# Patient Record
Sex: Female | Born: 1945 | Race: White | Hispanic: No | Smoking: Current every day smoker
Health system: Southern US, Community
[De-identification: ages and names within clinical notes are randomized; demographics above are authoritative.]

## PROBLEM LIST (undated history)

## (undated) DIAGNOSIS — T7840XA Allergy, unspecified, initial encounter: Secondary | ICD-10-CM

## (undated) DIAGNOSIS — M5126 Other intervertebral disc displacement, lumbar region: Secondary | ICD-10-CM

## (undated) DIAGNOSIS — B182 Chronic viral hepatitis C: Secondary | ICD-10-CM

## (undated) DIAGNOSIS — J42 Unspecified chronic bronchitis: Secondary | ICD-10-CM

## (undated) DIAGNOSIS — M47812 Spondylosis without myelopathy or radiculopathy, cervical region: Secondary | ICD-10-CM

## (undated) DIAGNOSIS — S82899A Other fracture of unspecified lower leg, initial encounter for closed fracture: Secondary | ICD-10-CM

## (undated) DIAGNOSIS — K219 Gastro-esophageal reflux disease without esophagitis: Secondary | ICD-10-CM

## (undated) DIAGNOSIS — J439 Emphysema, unspecified: Secondary | ICD-10-CM

## (undated) DIAGNOSIS — C50919 Malignant neoplasm of unspecified site of unspecified female breast: Secondary | ICD-10-CM

## (undated) DIAGNOSIS — C801 Malignant (primary) neoplasm, unspecified: Secondary | ICD-10-CM

## (undated) DIAGNOSIS — K8020 Calculus of gallbladder without cholecystitis without obstruction: Secondary | ICD-10-CM

## (undated) DIAGNOSIS — Z87442 Personal history of urinary calculi: Secondary | ICD-10-CM

## (undated) DIAGNOSIS — R0600 Dyspnea, unspecified: Secondary | ICD-10-CM

## (undated) DIAGNOSIS — J45909 Unspecified asthma, uncomplicated: Secondary | ICD-10-CM

## (undated) DIAGNOSIS — J449 Chronic obstructive pulmonary disease, unspecified: Secondary | ICD-10-CM

## (undated) DIAGNOSIS — K635 Polyp of colon: Secondary | ICD-10-CM

## (undated) DIAGNOSIS — I10 Essential (primary) hypertension: Secondary | ICD-10-CM

## (undated) DIAGNOSIS — E785 Hyperlipidemia, unspecified: Secondary | ICD-10-CM

## (undated) DIAGNOSIS — J189 Pneumonia, unspecified organism: Secondary | ICD-10-CM

## (undated) DIAGNOSIS — M51369 Other intervertebral disc degeneration, lumbar region without mention of lumbar back pain or lower extremity pain: Secondary | ICD-10-CM

## (undated) DIAGNOSIS — M5136 Other intervertebral disc degeneration, lumbar region: Secondary | ICD-10-CM

## (undated) DIAGNOSIS — M1990 Unspecified osteoarthritis, unspecified site: Secondary | ICD-10-CM

## (undated) DIAGNOSIS — K5792 Diverticulitis of intestine, part unspecified, without perforation or abscess without bleeding: Secondary | ICD-10-CM

## (undated) HISTORY — DX: Unspecified osteoarthritis, unspecified site: M19.90

## (undated) HISTORY — DX: Essential (primary) hypertension: I10

## (undated) HISTORY — DX: Chronic obstructive pulmonary disease, unspecified: J44.9

## (undated) HISTORY — PX: FRACTURE SURGERY: SHX138

## (undated) HISTORY — DX: Polyp of colon: K63.5

## (undated) HISTORY — DX: Malignant neoplasm of unspecified site of unspecified female breast: C50.919

## (undated) HISTORY — DX: Hyperlipidemia, unspecified: E78.5

## (undated) HISTORY — DX: Chronic viral hepatitis C: B18.2

## (undated) HISTORY — DX: Unspecified asthma, uncomplicated: J45.909

## (undated) HISTORY — PX: TUBAL LIGATION: SHX77

## (undated) HISTORY — DX: Calculus of gallbladder without cholecystitis without obstruction: K80.20

## (undated) HISTORY — DX: Spondylosis without myelopathy or radiculopathy, cervical region: M47.812

## (undated) HISTORY — DX: Diverticulitis of intestine, part unspecified, without perforation or abscess without bleeding: K57.92

## (undated) HISTORY — PX: TONSILLECTOMY: SUR1361

## (undated) HISTORY — DX: Emphysema, unspecified: J43.9

## (undated) HISTORY — PX: EYE SURGERY: SHX253

## (undated) HISTORY — PX: TONSILLECTOMY AND ADENOIDECTOMY: SHX28

## (undated) HISTORY — PX: CATARACT EXTRACTION: SUR2

## (undated) HISTORY — DX: Gastro-esophageal reflux disease without esophagitis: K21.9

## (undated) HISTORY — DX: Allergy, unspecified, initial encounter: T78.40XA

---

## 1978-07-02 HISTORY — PX: ABDOMINAL HYSTERECTOMY: SHX81

## 1997-07-02 HISTORY — PX: CHOLECYSTECTOMY: SHX55

## 2003-11-08 ENCOUNTER — Inpatient Hospital Stay (HOSPITAL_COMMUNITY): Admission: EM | Admit: 2003-11-08 | Discharge: 2003-11-10 | Payer: Self-pay | Admitting: Emergency Medicine

## 2009-07-02 HISTORY — PX: BREAST SURGERY: SHX581

## 2010-01-30 ENCOUNTER — Encounter: Admission: RE | Admit: 2010-01-30 | Discharge: 2010-01-30 | Payer: Self-pay | Admitting: Family Medicine

## 2010-02-07 ENCOUNTER — Encounter: Admission: RE | Admit: 2010-02-07 | Discharge: 2010-02-07 | Payer: Self-pay | Admitting: Family Medicine

## 2010-02-07 HISTORY — PX: BREAST BIOPSY: SHX20

## 2010-05-08 ENCOUNTER — Encounter: Admission: RE | Admit: 2010-05-08 | Discharge: 2010-05-08 | Payer: Self-pay | Admitting: Family Medicine

## 2010-07-31 ENCOUNTER — Other Ambulatory Visit: Payer: Self-pay | Admitting: Family Medicine

## 2010-07-31 DIAGNOSIS — Z09 Encounter for follow-up examination after completed treatment for conditions other than malignant neoplasm: Secondary | ICD-10-CM

## 2010-10-03 ENCOUNTER — Ambulatory Visit
Admission: RE | Admit: 2010-10-03 | Discharge: 2010-10-03 | Disposition: A | Discharge status: Home / Self Care | Payer: Medicaid Other | Source: Ambulatory Visit | Attending: Family Medicine | Admitting: Family Medicine

## 2010-10-03 DIAGNOSIS — Z09 Encounter for follow-up examination after completed treatment for conditions other than malignant neoplasm: Secondary | ICD-10-CM

## 2010-11-16 DIAGNOSIS — M179 Osteoarthritis of knee, unspecified: Secondary | ICD-10-CM | POA: Insufficient documentation

## 2010-11-17 NOTE — Discharge Summary (Signed)
NAMEBEVAN, VU                           ACCOUNT NO.:  [id number]   MEDICAL RECORD NO.:  [id number]                   PATIENT TYPE:  INP   LOCATION:  0454                                 FACILITY:  [facility] ([place]   PHYSICIAN:  Almedia Balls. Ranell Patrick, M.D.              DATE OF BIRTH:  04/25/1946   DATE OF ADMISSION:  11/08/2003  DATE OF DISCHARGE:  11/10/2003                                 DISCHARGE SUMMARY   ADDENDUM:   DISCHARGE PLANNING:  The patient was discharged home on Nov 11, 2003.   DIET:  Regular.   ACTIVITY:  Regular as long as she is wearing her double stirrup splint and  sling.   MEDICATIONS:  The patient was discharged on Percocet 1 tab q.4-6 h. p.r.n.  pain, Robaxin 500 mg q.6 h. p.r.n.  The patient was also given a Nicoderm  patch during her hospital stay.   FOLLOW UP:  The patient is to followup in 7-10 days in the office at [phone]  [phone].     Thomas B. Durwin Nora, P.A.                     Almedia Balls. Ranell Patrick, M.D.    TBD/MEDQ  D:  11/11/2003  T:  11/11/2003  Job:  [id number]

## 2010-11-17 NOTE — Op Note (Signed)
NAMEJACORIA, [name]                           ACCOUNT NO.:  [id number]   MEDICAL RECORD NO.:  [id number]                   PATIENT TYPE:  INP   LOCATION:  0454                                 FACILITY:  [facility]   PHYSICIAN:  Almedia Balls. Ranell Patrick, M.D.              DATE OF BIRTH:  1946-02-12   DATE OF PROCEDURE:  11/09/2003  DATE OF DISCHARGE:                                 OPERATIVE REPORT   PREOPERATIVE DIAGNOSES:  Left comminuted proximal ulnar fracture with  displaced radiocapitellar joint/Monteggia lesion.   POSTOPERATIVE DIAGNOSES:  Left comminuted proximal ulnar fracture with  displaced radial capitellar joint/Monteggia lesion.   PROCEDURE:  Open reduction and internal fixation of left proximal ulna  fracture with maintenance of radiocapitellar joint.   SURGEON:  Almedia Balls. Ranell Patrick, M.D.   ASSISTANT:  Alphonsa Overall, P.A.-C.   ANESTHESIA:  General.   ESTIMATED BLOOD LOSS:  Minimal.   TOURNIQUET TIME:  1 hour and 20 minutes.   INSTRUMENT COUNT:  Correct.   COMPLICATIONS:  None.   ANTIBIOTICS:  Perioperative antibiotics were given.   INDICATIONS FOR PROCEDURE:  The patient is a 65 year old female status post  closed reduction of a Monteggia fracture early this morning and measurement  of compartment pressures for impending compartment syndrome. The patient's  compartments became supple today with reduction of the radiocapitellar  joint. The patient now presents for definitive fixation of a comminuted  displaced and unstable ulnar fracture.   DESCRIPTION OF PROCEDURE:  After an adequate level anesthesia was achieved,  the patient was positioned supine on the operating table, nonsterile  tourniquet was placed on the left proximal arm, left arm sterilely prepped  and draped. Using C-arm fluoroscopy, an approach was made to the posterior  subcutaneous aspect of the proximal ulna. The fracture site was easily  identified, fractured hematoma and debris was removed from the  fracture  site, the fracture was anatomically reduced with __________reduction clamps.  K wires were used for provisional fixation and then an [employer]uMed contoured  olecranon plate was placed posteriorly and superiorly on the fractured ulna.  This was then secured to the fracture fragments utilizing a combination of  3.5 cortical and 2.7 cortical screws, these are bicortical when possible.  Their position and length were checked using C-arm in multiple planes. Care  was taken to ensure that no screws entered the joint. The elbow was taken  through a full range of motion and was noted to be stable including  radiocapitellar range of motion with pronation and supination. Thorough  irrigation was performed  followed by closure of the wound using a layered closure with 2-0 Vicryl and  a running 4-0 Monocryl. Steri-Strips were applied followed by a long arm  splint with the elbow at about 90 and the forearm maximally supinated to  stabilize the radiocapitellar joint.  Almedia Balls. Ranell Patrick, M.D.    SRN/MEDQ  D:  11/09/2003  T:  11/09/2003  Job:  [id number]

## 2010-11-17 NOTE — Op Note (Signed)
NAMEMALANEY, MCBEAN                           ACCOUNT NO.:  [id number]   MEDICAL RECORD NO.:  [id number]                   PATIENT TYPE:  INP   LOCATION:  0454                                 FACILITY:  [facility]   PHYSICIAN:  Almedia Balls. Ranell Patrick, M.D.              DATE OF BIRTH:  1945/10/11   DATE OF PROCEDURE:  11/09/2003  DATE OF DISCHARGE:                                 OPERATIVE REPORT   PREOPERATIVE DIAGNOSES:  Left Monteggia fracture with impending compartment  syndrome.   POSTOPERATIVE DIAGNOSES:  Left Monteggia fracture with impending compartment  syndrome.   PROCEDURE:  Compartment pressure checks using arterial line transducer left  forearm followed by closed reduction of left Monteggia fracture and  application of long arm splint.   SURGEON:  Almedia Balls. Ranell Patrick, M.D.   ASSISTANT:  None.   ANESTHESIA:  General anesthesia was used.   ESTIMATED BLOOD LOSS:  None.   COMPLICATIONS:  None.   FLUIDS REPLACED:  500 mL crystalloid.   INDICATIONS FOR PROCEDURE:  The patient is a 65 year old female who presents  with a left Monteggia fracture after falling on an out stretched left hand.  The patient was splinted on Nov 08, 2003, date of injury, and was planned for  surgery on Nov 09, 2003. During the night, the patient developed  paresthesias, numbness in her hand despite unwrapping her splint and  changing our position. The patient maintained numbness and tingling in her  hand, she had tense compartments and due to concern of her revolving  compartment syndrome, the patient was taken emergently to the operating room  for compartment pressure checks.   DESCRIPTION OF PROCEDURE:  After an adequate level of anesthesia was  achieved, the patient's arm was prepped, an 18 gauge needle was used with an  arterial line transducer to transduce the pressures within the compartment  starting in the upper arm above the elbow to measure a normal compartment  and this measured 4 mmHg at  this point.  The line was flushed and the common  dorsal compartment was checked and this was 32 with a diastolic pressure in  the mid 04'V.  At this point, the mobile __________ was checked, this was 12  and then the volar forearm was checked and this was 27 with diastolic  approaching 58. At this point, a procedure was made to proceed with closed  reduction and a remeasurement of compartments. We were able to successful  close reduce her radial head back into congruency with her capitellum. This  resulted in less deformity at the elbow, her ulna aligned better and we were  then able to let this rest for five minutes. The compartments seemed the  relax. We reassessed her dorsal compartment there and the pressure had gone  from about 32 down to 30, diastolic up to about 50 at this point from the  mid 40's. Based on improvement in the  patient's fracture position, the  visual improvement in her compartment being more supple and pliable and the  slight decrease in her compartment pressure as well as acceptable pressures  in the other compartments, it was decided by the attending surgeon to go  ahead and splint her at this time. We did not have the availability of  contoured plates for the olecranon fracture that we needed for open  reduction and internal fixation at this time and it would take a full hour  of transport for the instruments and preparation time, sterilization time  prior to being able to fix her elbow. Not wanting to expose her to an extra  hour of general anesthesia if it was unnecessary, a decision was made in  consultation with the attending anesthesia staff to wake the patient up.  We  will bring her back at her scheduled time at 10:30 or 11:00 this morning for  open reduction and internal fixation under general anesthesia.  The patient  was in stable condition when she finished surgery and was taken to the  recovery room in a long arm splint. Post splinting films were  obtained in  the operating room demonstrating a congruent reduction of her  radiocapitellar joint.                                               Almedia Balls. Ranell Patrick, M.D.    SRN/MEDQ  D:  11/09/2003  T:  11/09/2003  Job:  [id number]

## 2010-11-17 NOTE — Discharge Summary (Signed)
NAMECYRILLA, [name]                           ACCOUNT NO.:  [id number]   MEDICAL RECORD NO.:  [id number]                   PATIENT TYPE:  INP   LOCATION:  0454                                 FACILITY:  [facility]   PHYSICIAN:  Almedia Balls. Ranell Patrick, M.D.              DATE OF BIRTH:  1945-07-09   DATE OF ADMISSION:  11/08/2003  DATE OF DISCHARGE:  11/10/2003                                 DISCHARGE SUMMARY   ADMISSION DIAGNOSIS:  Left Monteggia fracture.   DISCHARGE DIAGNOSIS:  Left Monteggia fracture, status post open  reduction/internal fixation.   BRIEF HISTORY:  [name] is a 65 year old health female who states that  she had fallen from a standing position down onto her left arm on an  outstretched hand, landing straight on her left elbow.  Patient noticed  moderate-to-severe pain with a moderate deformity in her left elbow.  Presented to the [facility] emergency department for evaluation.  It was  determined that she had a left Monteggia fracture that needed surgical  repair.  Patient was admitted to the hospital to have this procedure  performed.   FIRST PROCEDURE:  On Nov 09, 2003 at 0500, patient had a closed reduction of  displaced radial capitellum.   ANESTHESIA:  General.   SURGEON:  Almedia Balls. Ranell Patrick, M.D.   ASSISTANT:  None.   ESTIMATED BLOOD LOSS:  None.   FLUIDS:  Crystalloid 500 cc.   COMPLICATIONS:  None.   SECOND PROCEDURE:  On Nov 09, 2003 at 1100, open reduction/internal fixation  of the left ulna.   ANESTHESIA:  General.   ESTIMATED BLOOD LOSS:  Minimal.   TOURNIQUET TIME:  One hour, 20 minutes.   Perioperative antibiotics were given.   COMPLICATIONS:  None.   INSTRUMENT COUNT:  Correct.   SURGEON:  Almedia Balls. Ranell Patrick, M.D.   ASSISTANT:  Donnie Coffin. Dixon, PA-C   LABORATORY VALUES:  Preop labs for [name] on May 9th show a white blood  count of 10.3, H&H of 14.5 and 41.9, platelets 341.  Chemistries:  Sodium  139, potassium 4.2, chloride  110, bicarb 25, BUN 12, creatinine 0.8, glucose  92.   HOSPITAL COURSE:  Patient was admitted on Nov 08, 2003 from the emergency  room after sustaining a fall and a left Monteggia fracture.  The patient was  splinted and sent to 4 [facility] for pain control until surgery the next day.  During the evening and mid morning, the patient developed numbness and  decreased sensation in her left arm, signifying possible compartment  syndrome.  Dr. Ranell Patrick was called in.  After it was determined that she did  have compartment syndrome in the left forearm, the patient was taken to the  operating room for surgery  #1, where the radial capitellum was reduced  under closed reduction.  Compartment pressures immediately dropped, and  patient was splinted for definitive surgery later that day.  At 1100 on May  10th, the patient was taken back to the operating room and had a left open  reduction/internal fixation of her left comminuted ulnar fracture with  displaced radial capitellum.  See above procedure note.  Patient tolerated  the procedure well.  She was transferred to the post anesthesia care unit  and then back up to the floor without difficulty.   On Nov 11, 2003, patient stated that she was doing quite well and was asking  for discharge home.  She denied very much pain.  She denied any numbness,  tingling, swelling, or loss of sensation in her left hand.  Capillary refill  was less than two seconds, and her grip strength was adequate at a 4/5, as  compared to her right.  Splint was intact.  The patient was afebrile.   DISCHARGE PLAN:  Patient was discharged home on Nov 11, 2003.   DIET:  Regular.   ACTIVITY:  DICTATION ENDED AT THIS POINT     Thomas B. Durwin Nora, P.A.                     Almedia Balls. Ranell Patrick, M.D.    TBD/MEDQ  D:  11/11/2003  T:  11/11/2003  Job:  [id number]

## 2011-04-18 ENCOUNTER — Other Ambulatory Visit: Payer: Self-pay | Admitting: Family Medicine

## 2011-04-18 DIAGNOSIS — N63 Unspecified lump in unspecified breast: Secondary | ICD-10-CM

## 2011-05-08 ENCOUNTER — Ambulatory Visit
Admission: RE | Admit: 2011-05-08 | Discharge: 2011-05-08 | Disposition: A | Discharge status: Home / Self Care | Payer: Medicare Other | Source: Ambulatory Visit | Attending: Family Medicine | Admitting: Family Medicine

## 2011-05-08 DIAGNOSIS — N63 Unspecified lump in unspecified breast: Secondary | ICD-10-CM

## 2011-05-29 DIAGNOSIS — R0789 Other chest pain: Secondary | ICD-10-CM | POA: Insufficient documentation

## 2011-05-29 HISTORY — DX: Other chest pain: R07.89

## 2011-10-02 DIAGNOSIS — J302 Other seasonal allergic rhinitis: Secondary | ICD-10-CM | POA: Insufficient documentation

## 2011-12-25 ENCOUNTER — Ambulatory Visit (INDEPENDENT_AMBULATORY_CARE_PROVIDER_SITE_OTHER): Payer: Medicaid Other | Admitting: Family Medicine

## 2011-12-25 ENCOUNTER — Encounter: Payer: Self-pay | Admitting: Family Medicine

## 2011-12-25 VITALS — BP 110/75 | HR 87 | Temp 98.2°F | Ht 60.5 in | Wt 143.6 lb

## 2011-12-25 DIAGNOSIS — F341 Dysthymic disorder: Secondary | ICD-10-CM

## 2011-12-25 DIAGNOSIS — I10 Essential (primary) hypertension: Secondary | ICD-10-CM

## 2011-12-25 DIAGNOSIS — F418 Other specified anxiety disorders: Secondary | ICD-10-CM

## 2011-12-25 DIAGNOSIS — K5900 Constipation, unspecified: Secondary | ICD-10-CM

## 2011-12-25 DIAGNOSIS — J449 Chronic obstructive pulmonary disease, unspecified: Secondary | ICD-10-CM

## 2011-12-25 DIAGNOSIS — K219 Gastro-esophageal reflux disease without esophagitis: Secondary | ICD-10-CM | POA: Insufficient documentation

## 2011-12-25 DIAGNOSIS — K5909 Other constipation: Secondary | ICD-10-CM | POA: Insufficient documentation

## 2011-12-25 MED ORDER — VENLAFAXINE HCL ER 37.5 MG PO CP24: 37.5000 mg | ORAL_CAPSULE | Freq: Every day | ORAL | Status: DC

## 2011-12-25 NOTE — Assessment & Plan Note (Signed)
New to provider.  Chronic for pt.  Excellent control today.  Asymptomatic.  No changes.

## 2011-12-25 NOTE — Assessment & Plan Note (Signed)
New to provider.  Pt reports this was situational and would like to wean off SSRI.  Plan outlined in pt instructions.
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
## 2011-12-25 NOTE — Patient Instructions (Addendum)
Schedule your complete physical for November Decrease to 1 tab of 37.5mg  Effexor daily x2-4 weeks.  When you are ready, decrease to every other day x2-4 weeks and then stop Call with any questions or concerns Think of [place] as your home base Welcome!  We're glad to have you!!!

## 2011-12-25 NOTE — Assessment & Plan Note (Signed)
New to provider.  Chronic for pt.  Feels sxs are well controlled on current med regimen.  Only SOB occurs during exercise (which pt does regularly).  Quit smoking in April- applauded this.  Will continue to follow.

## 2011-12-25 NOTE — Assessment & Plan Note (Signed)
New to provider, chronic for pt.  sxs now well controlled after being switched to Dexilant.  Will continue to follow.

## 2012-04-15 ENCOUNTER — Other Ambulatory Visit: Payer: Self-pay | Admitting: Family Medicine

## 2012-04-15 DIAGNOSIS — Z1231 Encounter for screening mammogram for malignant neoplasm of breast: Secondary | ICD-10-CM

## 2012-04-15 DIAGNOSIS — Z780 Asymptomatic menopausal state: Secondary | ICD-10-CM

## 2012-05-06 ENCOUNTER — Encounter: Payer: Medicare Other | Admitting: Family Medicine

## 2012-05-19 ENCOUNTER — Ambulatory Visit
Admission: RE | Admit: 2012-05-19 | Discharge: 2012-05-19 | Disposition: A | Discharge status: Home / Self Care | Payer: Medicare Other | Source: Ambulatory Visit | Attending: Family Medicine | Admitting: Family Medicine

## 2012-05-19 DIAGNOSIS — Z1231 Encounter for screening mammogram for malignant neoplasm of breast: Secondary | ICD-10-CM

## 2012-05-19 DIAGNOSIS — Z780 Asymptomatic menopausal state: Secondary | ICD-10-CM

## 2012-07-21 ENCOUNTER — Encounter: Payer: Medicare Other | Admitting: Family Medicine

## 2012-08-22 DIAGNOSIS — Z79890 Hormone replacement therapy (postmenopausal): Secondary | ICD-10-CM | POA: Insufficient documentation

## 2012-08-22 HISTORY — DX: Hormone replacement therapy: Z79.890

## 2012-11-20 ENCOUNTER — Other Ambulatory Visit: Payer: Self-pay | Admitting: Family Medicine

## 2012-11-20 DIAGNOSIS — M545 Low back pain: Secondary | ICD-10-CM

## 2012-11-28 ENCOUNTER — Ambulatory Visit
Admission: RE | Admit: 2012-11-28 | Discharge: 2012-11-28 | Disposition: A | Discharge status: Home / Self Care | Payer: Medicare Other | Source: Ambulatory Visit | Attending: Family Medicine | Admitting: Family Medicine

## 2012-11-28 DIAGNOSIS — M545 Low back pain: Secondary | ICD-10-CM

## 2012-12-01 ENCOUNTER — Ambulatory Visit (INDEPENDENT_AMBULATORY_CARE_PROVIDER_SITE_OTHER): Payer: Self-pay

## 2012-12-01 DIAGNOSIS — K5900 Constipation, unspecified: Secondary | ICD-10-CM

## 2013-04-10 ENCOUNTER — Other Ambulatory Visit: Payer: Self-pay

## 2013-04-10 DIAGNOSIS — Z1231 Encounter for screening mammogram for malignant neoplasm of breast: Secondary | ICD-10-CM

## 2013-05-21 ENCOUNTER — Ambulatory Visit: Payer: Self-pay

## 2013-06-01 ENCOUNTER — Other Ambulatory Visit: Payer: Self-pay | Admitting: Dermatology

## 2013-06-15 DIAGNOSIS — C4492 Squamous cell carcinoma of skin, unspecified: Secondary | ICD-10-CM | POA: Insufficient documentation

## 2013-06-15 HISTORY — DX: Squamous cell carcinoma of skin, unspecified: C44.92

## 2013-06-18 ENCOUNTER — Ambulatory Visit
Admission: RE | Admit: 2013-06-18 | Discharge: 2013-06-18 | Disposition: A | Discharge status: Home / Self Care | Payer: Medicare Other | Source: Ambulatory Visit

## 2013-06-18 DIAGNOSIS — Z1231 Encounter for screening mammogram for malignant neoplasm of breast: Secondary | ICD-10-CM

## 2013-06-22 ENCOUNTER — Encounter (INDEPENDENT_AMBULATORY_CARE_PROVIDER_SITE_OTHER): Payer: Self-pay

## 2013-06-22 ENCOUNTER — Ambulatory Visit (INDEPENDENT_AMBULATORY_CARE_PROVIDER_SITE_OTHER)
Admission: RE | Admit: 2013-06-22 | Discharge: 2013-06-22 | Disposition: A | Discharge status: Home / Self Care | Payer: Medicare Other | Source: Ambulatory Visit | Attending: Pulmonary Disease | Admitting: Pulmonary Disease

## 2013-06-22 ENCOUNTER — Encounter: Payer: Self-pay | Admitting: Pulmonary Disease

## 2013-06-22 ENCOUNTER — Ambulatory Visit (INDEPENDENT_AMBULATORY_CARE_PROVIDER_SITE_OTHER): Payer: Medicare Other | Admitting: Pulmonary Disease

## 2013-06-22 VITALS — BP 110/72 | HR 79 | Temp 98.1°F | Ht 61.0 in | Wt 157.0 lb

## 2013-06-22 DIAGNOSIS — J449 Chronic obstructive pulmonary disease, unspecified: Secondary | ICD-10-CM

## 2013-06-22 MED ORDER — HYDROCHLOROTHIAZIDE 25 MG PO TABS: 25.0000 mg | ORAL_TABLET | Freq: Every day | ORAL | Status: DC

## 2013-06-22 MED ORDER — NICOTINE 10 MG IN INHA: 1.0000 | RESPIRATORY_TRACT | Status: DC | PRN

## 2013-06-22 NOTE — Progress Notes (Signed)
Subjective:    Patient ID: [name], female    DOB: 1946/04/22, 67 y.o.   MRN: [id number]  HPI  PCP -Asencion Partridge, [facility]   67 year old one pack per day smoker referred for evaluation of dyspnea. PFTs on 05/20/13 showed FEV1 of 86%-1.76, FVC of 88%-2.39 in ratio 74. Diffusion capacity was decreased at 12.7-65% but adjusted for alveolar volume. He also underwent cardiac cath which showed normal coronaries. She reports NYHA class III dyspnea since October 2014, during activities of daily living such as taking a shower, carrying garbage making her bed or cooking. She is maintained on Spiriva and pro-air and is not sure whether these provide any relief. She reports that it is harder to breathe out and has an occasional nocturnal wheezing. She reports a dry deep cough that intermittently bothers her. Medication review shows lisinopril that was substituted for Cozaar in October 2014. She denies seasonal allergies her constant throat clearing no. There is no prior or family history of venous thromboembolism. Labs show her HCO3 of 28 , hemoglobin of 15.6  She started smoking at age 67, smoked a maximum of 2 packs per day and has now cut down to one pack per day. She's tried nicotine patches, gum and hypnotism in area quit attempts. Chantix made her 'go bonkers.'  Chest x-ray did not show any infiltrates or effusions, hyperinflation was noted. She does not desaturate on walking  Past Medical History  Diagnosis Date  . Diverticulitis   . Emphysema of lung   . Allergy   . Colon polyp   . Kidney stones   . Hypertension     Past Surgical History  Procedure Laterality Date  . Cholecystectomy  1999  . Breast surgery  2011    biopsy  . Tonsillectomy and adenoidectomy  1950's  . Abdominal hysterectomy  1980    Allergies  Allergen Reactions  . Chantix [Varenicline]     Makes her feel crazy  . Codeine     Itch     History   Social History  . Marital Status: Legally Separated   Spouse Name: N/A    Number of Children: 0  . Years of Education: N/A   Occupational History  . retired    Social History Main Topics  . Smoking status: Current Every Day Smoker -- 1.00 packs/day for 53 years    Types: Cigarettes  . Smokeless tobacco: Not on file  . Alcohol Use: Yes     Comment: occiasionally  . Drug Use: No  . Sexual Activity: Not on file   Other Topics Concern  . Not on file   Social History Narrative  . No narrative on file    Family History  Problem Relation Age of Onset  . Heart disease Mother   . Stroke Brother   . Stroke Maternal Uncle   . COPD Paternal Uncle      Review of Systems  Constitutional: Negative for fever and unexpected weight change.  HENT: Positive for congestion, postnasal drip, rhinorrhea, sinus pressure and sneezing. Negative for dental problem, ear pain, nosebleeds, sore throat and trouble swallowing.   Eyes: Negative for redness and itching.  Respiratory: Positive for cough, chest tightness and shortness of breath. Negative for wheezing.   Cardiovascular: Negative for palpitations and leg swelling.  Gastrointestinal: Negative for nausea and vomiting.  Genitourinary: Negative for dysuria.  Musculoskeletal: Negative for joint swelling.  Skin: Negative for rash.  Neurological: Negative for headaches.  Hematological: Does not bruise/bleed easily.  Psychiatric/Behavioral: Negative for dysphoric mood. The patient is not nervous/anxious.        Objective:   Physical Exam  Gen. Pleasant, well-nourished, in no distress, normal affect ENT - no lesions, no post nasal drip Neck: No JVD, no thyromegaly, no carotid bruits Lungs: no use of accessory muscles, no dullness to percussion, decreased BL without rales or rhonchi  Cardiovascular: Rhythm regular, heart sounds  normal, no murmurs or gallops, no peripheral edema Abdomen: soft and non-tender, no hepatosplenomegaly, BS normal. Musculoskeletal: No deformities, no cyanosis or  clubbing Neuro:  alert, non focal        Assessment & Plan:

## 2013-06-22 NOTE — Patient Instructions (Signed)
Ambulatory satn & CXR today You have to quit smoking ! You can combine nicotine gum/ patch with nicotrol inhaler (Rx will be sent ) & E- cigarettes Stay on spiriva, Ok to use albuterol before activity STOP lisinopril-HCTZ, Rx for HCTZ 25 mg daily will be sent - Check Bp on this twice a week Flu shot

## 2013-06-22 NOTE — Assessment & Plan Note (Addendum)
Her dyspnea does seem to be out of proportion to degree of airway obstruction. Cardiac etiology has been ruled out, very low risk for venous thromboembolic You have to quit smoking ! You can combine nicotine gum/ patch with nicotrol inhaler (Rx will be sent ) & E- cigarettes Stay on spiriva, Ok to use albuterol before activity STOP lisinopril-HCTZ, Rx for HCTZ 25 mg daily will be sent - Check Bp on this twice a week Flu shot

## 2013-06-24 ENCOUNTER — Telehealth: Payer: Self-pay | Admitting: Pulmonary Disease

## 2013-06-24 NOTE — Telephone Encounter (Signed)
I spoke with patient about results and she verbalized understanding and had no questions 

## 2013-07-20 ENCOUNTER — Encounter: Payer: Self-pay | Admitting: Adult Health

## 2013-07-20 ENCOUNTER — Encounter (INDEPENDENT_AMBULATORY_CARE_PROVIDER_SITE_OTHER): Payer: Self-pay

## 2013-07-20 ENCOUNTER — Ambulatory Visit (INDEPENDENT_AMBULATORY_CARE_PROVIDER_SITE_OTHER): Payer: Medicare Other | Admitting: Adult Health

## 2013-07-20 VITALS — BP 126/76 | HR 69 | Temp 97.8°F | Ht 61.0 in | Wt 156.6 lb

## 2013-07-20 DIAGNOSIS — J449 Chronic obstructive pulmonary disease, unspecified: Secondary | ICD-10-CM

## 2013-07-20 MED ORDER — NICOTINE 10 MG IN INHA: 1.0000 | RESPIRATORY_TRACT | Status: DC | PRN

## 2013-07-20 MED ORDER — HYDROCHLOROTHIAZIDE 25 MG PO TABS: 25.0000 mg | ORAL_TABLET | Freq: Every day | ORAL | Status: DC

## 2013-07-20 NOTE — Assessment & Plan Note (Signed)
Mild COPD by PFT w/ ongoing smoking .  Associated rhinitis , cough improved off ACE inhibitor  Would avoid ACE inhibitors in future   Plan  Saline nasal rinses /spray As needed   Saline Nasal Gel -AYR apply nasal passages As needed   Continue to work on stopping smoking  Continue on Spiriva .  Follow up Dr. Elsworth Soho  In 3 months and As needed   Please contact office for sooner follow up if symptoms do not improve or worsen or seek emergency care

## 2013-07-20 NOTE — Addendum Note (Signed)
Addended by: Parke Poisson E on: 07/20/2013 01:02 PM   Modules accepted: Orders

## 2013-07-20 NOTE — Patient Instructions (Signed)
Saline nasal rinses /spray As needed   Saline Nasal Gel -AYR apply nasal passages As needed   Continue to work on stopping smoking  Continue on Spiriva .  Follow up Dr. Elsworth Soho  In 3 months and As needed   Please contact office for sooner follow up if symptoms do not improve or worsen or seek emergency care

## 2013-07-20 NOTE — Progress Notes (Signed)
Subjective:    Patient ID: [name], female    DOB: [birth year], 68 y.o.   MRN: [id number]  HPI PCP -[name], [facility]  68 year old one pack per day smoker referred for evaluation of dyspnea 06/22/13  PFTs on 05/20/13 showed FEV1 of 86%-1.76, FVC of 88%-2.39 in ratio 74. Diffusion capacity was decreased at 12.7-65% but adjusted for alveolar volume. He also underwent cardiac cath which showed normal coronaries.  06/22/13 IOV She reports NYHA class III dyspnea since October 2014, during activities of daily living such as taking a shower, carrying garbage making her bed or cooking. She is maintained on Spiriva and pro-air and is not sure whether these provide any relief. She reports that it is harder to breathe out and has an occasional nocturnal wheezing. She reports a dry deep cough that intermittently bothers her. Medication review shows lisinopril that was substituted for Cozaar in October 2014. She denies seasonal allergies her constant throat clearing no. There is no prior or family history of venous thromboembolism. Labs show her HCO3 of 28 , hemoglobin of 15.6  She started smoking at age 87, smoked a maximum of 2 packs per day and has now cut down to one pack per day. She's tried nicotine patches, gum and hypnotism in area quit attempts. Chantix made her 'go bonkers.'  Chest x-ray did not show any infiltrates or effusions, hyperinflation was noted. She does not desaturate on walking >>Stay on spiriva, STOP lisinopril-HCTZ, Rx for HCTZ 25 mg daily will be sent  06/22/13-CXR NAD   07/20/2013 Follow up  Pt returns for 1 month follow up.  Says she does feel a little better.  Pt c/o sinus congestion, postnasal drip, some SOB with exertion. Last ov was taken off ACE .  B/p is good on HCTZ alone.  Cough is some better but has daily post nasal drip and nasal stuffiness.  Using nicotrol inhaler , has cut back on cigs -1/2 PPD  She is Retired. But works Investment banker, corporate houses.   Last visit Chest x-ray showed no acute changes. She denies any hemoptysis, orthopnea, PND, leg swelling, calf pain, or chest pain.   Review of Systems Constitutional:   No  weight loss, night sweats,  Fevers, chills, fatigue, or  lassitude.  HEENT:   No headaches,  Difficulty swallowing,  Tooth/dental problems, or  Sore throat,                No sneezing, itching, ear ache, nasal congestion, post nasal drip,   CV:  No chest pain,  Orthopnea, PND, swelling in lower extremities, anasarca, dizziness, palpitations, syncope.   GI  No heartburn, indigestion, abdominal pain, nausea, vomiting, diarrhea, change in bowel habits, loss of appetite, bloody stools.   Resp: No shortness of breath with exertion or at rest.  No excess mucus, no productive cough,  No non-productive cough,  No coughing up of blood.  No change in color of mucus.  No wheezing.  No chest wall deformity  Skin: no rash or lesions.  GU: no dysuria, change in color of urine, no urgency or frequency.  No flank pain, no hematuria   MS:  No joint pain or swelling.  No decreased range of motion.  No back pain.  Psych:  No change in mood or affect. No depression or anxiety.  No memory loss.         Objective:   Physical Exam  GEN: A/Ox3; pleasant , NAD, well nourished   HEENT:  [place]/AT,  EACs-clear, TMs-wnl, NOSE-clear, THROAT-clear, no lesions, no postnasal drip or exudate noted.   NECK:  Supple w/ fair ROM; no JVD; normal carotid impulses w/o bruits; no thyromegaly or nodules palpated; no lymphadenopathy.  RESP  Clear  P & A; w/o, wheezes/ rales/ or rhonchi.no accessory muscle use, no dullness to percussion  CARD:  RRR, no m/r/g  , no peripheral edema, pulses intact, no cyanosis or clubbing.  GI:   Soft & nt; nml bowel sounds; no organomegaly or masses detected.  Musco: Warm bil, no deformities or joint swelling noted.   Neuro: alert, no focal deficits noted.    Skin: Warm, no lesions or rashes         Assessment & Plan:

## 2013-11-30 ENCOUNTER — Other Ambulatory Visit: Payer: Self-pay | Admitting: Dermatology

## 2014-04-27 ENCOUNTER — Other Ambulatory Visit: Payer: Self-pay | Admitting: Family Medicine

## 2014-04-27 DIAGNOSIS — Z1231 Encounter for screening mammogram for malignant neoplasm of breast: Secondary | ICD-10-CM

## 2014-06-02 ENCOUNTER — Other Ambulatory Visit: Payer: Self-pay | Admitting: Dermatology

## 2014-06-08 ENCOUNTER — Other Ambulatory Visit: Payer: Self-pay | Admitting: Dermatology

## 2014-06-22 ENCOUNTER — Ambulatory Visit: Payer: Medicare Other

## 2014-06-28 ENCOUNTER — Ambulatory Visit
Admission: RE | Admit: 2014-06-28 | Discharge: 2014-06-28 | Disposition: A | Discharge status: Home / Self Care | Payer: Medicare Other | Source: Ambulatory Visit | Attending: Family Medicine | Admitting: Family Medicine

## 2014-06-28 DIAGNOSIS — Z1231 Encounter for screening mammogram for malignant neoplasm of breast: Secondary | ICD-10-CM

## 2014-06-30 ENCOUNTER — Other Ambulatory Visit: Payer: Self-pay | Admitting: Family Medicine

## 2014-06-30 DIAGNOSIS — R928 Other abnormal and inconclusive findings on diagnostic imaging of breast: Secondary | ICD-10-CM

## 2014-07-13 ENCOUNTER — Ambulatory Visit
Admission: RE | Admit: 2014-07-13 | Discharge: 2014-07-13 | Disposition: A | Discharge status: Home / Self Care | Payer: Medicare Other | Source: Ambulatory Visit | Attending: Family Medicine | Admitting: Family Medicine

## 2014-07-13 DIAGNOSIS — R928 Other abnormal and inconclusive findings on diagnostic imaging of breast: Secondary | ICD-10-CM

## 2014-11-02 DIAGNOSIS — M47812 Spondylosis without myelopathy or radiculopathy, cervical region: Secondary | ICD-10-CM | POA: Insufficient documentation

## 2014-11-02 DIAGNOSIS — B1920 Unspecified viral hepatitis C without hepatic coma: Secondary | ICD-10-CM | POA: Insufficient documentation

## 2014-11-12 ENCOUNTER — Encounter: Payer: Self-pay | Admitting: Internal Medicine

## 2014-12-17 ENCOUNTER — Other Ambulatory Visit: Payer: Self-pay | Admitting: Family Medicine

## 2014-12-17 DIAGNOSIS — N6489 Other specified disorders of breast: Secondary | ICD-10-CM

## 2014-12-22 ENCOUNTER — Ambulatory Visit
Admission: RE | Admit: 2014-12-22 | Discharge: 2014-12-22 | Disposition: A | Discharge status: Home / Self Care | Payer: Medicare Other | Source: Ambulatory Visit | Attending: Family Medicine | Admitting: Family Medicine

## 2014-12-22 DIAGNOSIS — N6489 Other specified disorders of breast: Secondary | ICD-10-CM

## 2015-01-12 ENCOUNTER — Encounter: Payer: Self-pay | Admitting: Internal Medicine

## 2015-01-12 ENCOUNTER — Ambulatory Visit (INDEPENDENT_AMBULATORY_CARE_PROVIDER_SITE_OTHER): Payer: Medicare Other | Admitting: Internal Medicine

## 2015-01-12 VITALS — BP 102/60 | HR 72 | Ht 60.0 in | Wt 159.1 lb

## 2015-01-12 DIAGNOSIS — K5909 Other constipation: Secondary | ICD-10-CM | POA: Diagnosis not present

## 2015-01-12 DIAGNOSIS — Z720 Tobacco use: Secondary | ICD-10-CM

## 2015-01-12 NOTE — Progress Notes (Signed)
HISTORY OF PRESENT ILLNESS:  [name] is a 69 y.o. female with multiple medical problems as listed below. She is self-referred regarding problems with chronic constipation. She had been involved in GI research studies previously. The patient tells me that she has had lifelong constipation. Associated with this is bloating and gas. She takes Colace regularly without significant improvement. However, she will use Dulcolax intermittently with good results. She tolerates this well. She does state that she was placed on Linzess, but this resulted in unpredictable loose bowels at times described as "a blowout". Patient has been seen by other gastroenterologists previously. Previously, she underwent complete colonoscopy with Dr. Ezzie Dural in [facility] 04/18/2010. The examination revealed left-sided diverticulosis and a benign-appearing diminutive rectal polyp which was removed. Previous CT scan of the abdomen and pelvis with contrast September 2012 to evaluate right lower quadrant pain was negative except for constipation. She is status post cholecystectomy. Patient denies nausea, vomiting, weight loss, bleeding, or fevers. Her stated goal is to have one regular bowel movement each morning without necessarily needing to take agents that promote bowel movements.  REVIEW OF SYSTEMS:  All non-GI ROS negative except for arthritis, cough, allergies  Past Medical History  Diagnosis Date  . Diverticulitis   . Emphysema of lung   . Allergy   . Colon polyp   . Kidney stones   . Hypertension   . COPD (chronic obstructive pulmonary disease)   . Chronic hepatitis C   . GERD (gastroesophageal reflux disease)   . Osteoarthritis   . Spondylosis of cervical region without myelopathy or radiculopathy   . Gallstones     Past Surgical History  Procedure Laterality Date  . Cholecystectomy  1999  . Breast surgery  2011    biopsy  . Tonsillectomy and adenoidectomy  1950's  . Abdominal hysterectomy  [place]  reports that she has been smoking Cigarettes.  She started smoking about 54 years ago. She has a 26.5 pack-year smoking history. She does not have any smokeless tobacco history on file. She reports that she drinks alcohol. She reports that she does not use illicit drugs.  family history includes COPD in her paternal uncle; Heart disease in her mother; Stroke in her brother and maternal uncle.  Allergies  Allergen Reactions  . Chantix [Varenicline]     Makes her feel crazy  . Codeine     Itch        PHYSICAL EXAMINATION: Vital signs: BP 102/60 mmHg  Pulse 72  Ht 5' (1.524 m)  Wt 159 lb 2 oz (72.179 kg)  BMI 31.08 kg/m2  Constitutional: generally well-appearing, no acute distress. Strong odor of tobacco smoke Psychiatric: alert and oriented x3, cooperative Eyes: extraocular movements intact, anicteric, conjunctiva pink Mouth: oral pharynx moist, no lesions Neck: supple no lymphadenopathy Cardiovascular: heart regular rate and rhythm, no murmur Lungs: clear to auscultation bilaterally Abdomen: soft, nontender, nondistended, no obvious ascites, no peritoneal signs, normal bowel sounds, no organomegaly Rectal: Deferred Extremities: no clubbing cyanosis or lower extremity edema bilaterally Skin: no lesions on visible extremities Neuro: No focal deficits. No asterixis.   ASSESSMENT:  #1. Chronic functional constipation. Ongoing. #2. Colonoscopy 2011 with diverticulosis and diminutive rectal polyp #3. Multiple medical problems. Stable #4. Chronic tobacco abuse  PLAN:  #1. Discussed with her multiple strategies to deal with her constipation. Since she can use Dulcolax on demand with good results and no appreciable negative effects, I would recommend this strategy. I discussed  with her the introduction of regular MiraLAX and titrate this to achieve the desired effect. She will give it a try. I did tell her that she would not likely achieve her  goals, as stated above, with regards to bowel movements. #2. Return to the care of your primary provider #3. Stop smoking. Advised

## 2015-01-12 NOTE — Patient Instructions (Signed)
Please follow up as needed 

## 2015-02-01 DIAGNOSIS — K7400 Hepatic fibrosis, unspecified: Secondary | ICD-10-CM | POA: Insufficient documentation

## 2015-05-18 ENCOUNTER — Other Ambulatory Visit: Payer: Self-pay

## 2015-05-18 DIAGNOSIS — Z1231 Encounter for screening mammogram for malignant neoplasm of breast: Secondary | ICD-10-CM

## 2015-06-01 ENCOUNTER — Ambulatory Visit: Payer: Medicare Other

## 2015-06-06 ENCOUNTER — Ambulatory Visit: Payer: Medicare Other

## 2015-06-14 ENCOUNTER — Other Ambulatory Visit: Payer: Self-pay | Admitting: Family Medicine

## 2015-06-14 DIAGNOSIS — E2839 Other primary ovarian failure: Secondary | ICD-10-CM

## 2015-07-08 ENCOUNTER — Other Ambulatory Visit: Payer: Self-pay | Admitting: Physical Medicine and Rehabilitation

## 2015-07-08 DIAGNOSIS — M4807 Spinal stenosis, lumbosacral region: Secondary | ICD-10-CM

## 2015-07-11 ENCOUNTER — Ambulatory Visit: Payer: Medicare Other

## 2015-07-15 ENCOUNTER — Ambulatory Visit
Admission: RE | Admit: 2015-07-15 | Discharge: 2015-07-15 | Disposition: A | Discharge status: Home / Self Care | Payer: Medicare Other | Source: Ambulatory Visit | Attending: Family Medicine | Admitting: Family Medicine

## 2015-07-15 DIAGNOSIS — E2839 Other primary ovarian failure: Secondary | ICD-10-CM

## 2015-07-16 ENCOUNTER — Ambulatory Visit
Admission: RE | Admit: 2015-07-16 | Discharge: 2015-07-16 | Disposition: A | Discharge status: Home / Self Care | Payer: Medicare Other | Source: Ambulatory Visit | Attending: Physical Medicine and Rehabilitation | Admitting: Physical Medicine and Rehabilitation

## 2015-07-16 DIAGNOSIS — M4807 Spinal stenosis, lumbosacral region: Secondary | ICD-10-CM

## 2015-07-21 ENCOUNTER — Ambulatory Visit
Admission: RE | Admit: 2015-07-21 | Discharge: 2015-07-21 | Disposition: A | Discharge status: Home / Self Care | Payer: Medicare Other | Source: Ambulatory Visit

## 2015-07-21 DIAGNOSIS — Z1231 Encounter for screening mammogram for malignant neoplasm of breast: Secondary | ICD-10-CM

## 2016-06-12 ENCOUNTER — Other Ambulatory Visit: Payer: Self-pay | Admitting: Family Medicine

## 2016-06-12 DIAGNOSIS — Z1231 Encounter for screening mammogram for malignant neoplasm of breast: Secondary | ICD-10-CM

## 2016-07-23 ENCOUNTER — Ambulatory Visit: Payer: Medicare Other

## 2016-07-24 DIAGNOSIS — N3941 Urge incontinence: Secondary | ICD-10-CM | POA: Insufficient documentation

## 2016-08-09 ENCOUNTER — Ambulatory Visit
Admission: RE | Admit: 2016-08-09 | Discharge: 2016-08-09 | Disposition: A | Discharge status: Home / Self Care | Payer: Medicare Other | Source: Ambulatory Visit | Attending: Family Medicine | Admitting: Family Medicine

## 2016-08-09 DIAGNOSIS — Z1231 Encounter for screening mammogram for malignant neoplasm of breast: Secondary | ICD-10-CM

## 2016-08-13 ENCOUNTER — Other Ambulatory Visit: Payer: Self-pay | Admitting: Family Medicine

## 2016-08-13 DIAGNOSIS — Z122 Encounter for screening for malignant neoplasm of respiratory organs: Secondary | ICD-10-CM

## 2016-08-20 ENCOUNTER — Ambulatory Visit (HOSPITAL_COMMUNITY)
Admission: RE | Admit: 2016-08-20 | Discharge: 2016-08-20 | Disposition: A | Discharge status: Home / Self Care | Payer: Medicare Other | Source: Ambulatory Visit | Attending: Family Medicine | Admitting: Family Medicine

## 2016-08-20 DIAGNOSIS — J439 Emphysema, unspecified: Secondary | ICD-10-CM | POA: Insufficient documentation

## 2016-08-20 DIAGNOSIS — Z122 Encounter for screening for malignant neoplasm of respiratory organs: Secondary | ICD-10-CM | POA: Insufficient documentation

## 2016-08-20 DIAGNOSIS — I2510 Atherosclerotic heart disease of native coronary artery without angina pectoris: Secondary | ICD-10-CM | POA: Diagnosis not present

## 2016-08-20 DIAGNOSIS — I700 Atherosclerosis of aorta: Secondary | ICD-10-CM | POA: Insufficient documentation

## 2016-08-30 DIAGNOSIS — Z129 Encounter for screening for malignant neoplasm, site unspecified: Secondary | ICD-10-CM | POA: Insufficient documentation

## 2017-03-16 ENCOUNTER — Emergency Department (HOSPITAL_COMMUNITY): Payer: Medicare Other

## 2017-03-16 ENCOUNTER — Emergency Department (HOSPITAL_COMMUNITY)
Admission: EM | Admit: 2017-03-16 | Discharge: 2017-03-16 | Disposition: A | Discharge status: Home / Self Care | Payer: Medicare Other | Attending: Emergency Medicine | Admitting: Emergency Medicine

## 2017-03-16 ENCOUNTER — Encounter (HOSPITAL_COMMUNITY): Payer: Self-pay

## 2017-03-16 DIAGNOSIS — Y92039 Unspecified place in apartment as the place of occurrence of the external cause: Secondary | ICD-10-CM | POA: Insufficient documentation

## 2017-03-16 DIAGNOSIS — Z79899 Other long term (current) drug therapy: Secondary | ICD-10-CM | POA: Insufficient documentation

## 2017-03-16 DIAGNOSIS — Y9389 Activity, other specified: Secondary | ICD-10-CM | POA: Insufficient documentation

## 2017-03-16 DIAGNOSIS — F17210 Nicotine dependence, cigarettes, uncomplicated: Secondary | ICD-10-CM | POA: Diagnosis not present

## 2017-03-16 DIAGNOSIS — I10 Essential (primary) hypertension: Secondary | ICD-10-CM | POA: Diagnosis not present

## 2017-03-16 DIAGNOSIS — S99911A Unspecified injury of right ankle, initial encounter: Secondary | ICD-10-CM | POA: Diagnosis present

## 2017-03-16 DIAGNOSIS — S82899A Other fracture of unspecified lower leg, initial encounter for closed fracture: Secondary | ICD-10-CM

## 2017-03-16 DIAGNOSIS — Y999 Unspecified external cause status: Secondary | ICD-10-CM | POA: Insufficient documentation

## 2017-03-16 DIAGNOSIS — IMO0001 Reserved for inherently not codable concepts without codable children: Secondary | ICD-10-CM

## 2017-03-16 DIAGNOSIS — W109XXA Fall (on) (from) unspecified stairs and steps, initial encounter: Secondary | ICD-10-CM | POA: Diagnosis not present

## 2017-03-16 DIAGNOSIS — J439 Emphysema, unspecified: Secondary | ICD-10-CM | POA: Insufficient documentation

## 2017-03-16 DIAGNOSIS — M545 Low back pain: Secondary | ICD-10-CM | POA: Diagnosis not present

## 2017-03-16 DIAGNOSIS — S82841A Displaced bimalleolar fracture of right lower leg, initial encounter for closed fracture: Secondary | ICD-10-CM | POA: Diagnosis not present

## 2017-03-16 DIAGNOSIS — Z9889 Other specified postprocedural states: Secondary | ICD-10-CM

## 2017-03-16 HISTORY — DX: Other fracture of unspecified lower leg, initial encounter for closed fracture: S82.899A

## 2017-03-16 MED ORDER — OXYCODONE-ACETAMINOPHEN 5-325 MG PO TABS
1.0000 | ORAL_TABLET | Freq: Once | ORAL | Status: AC
  Administered 2017-03-16: 1 via ORAL
  Filled 2017-03-16: qty 1

## 2017-03-16 MED ORDER — OXYCODONE-ACETAMINOPHEN 5-325 MG PO TABS: 1.0000 | ORAL_TABLET | ORAL | 0 refills | Status: DC | PRN

## 2017-03-16 MED ORDER — MORPHINE SULFATE (PF) 4 MG/ML IV SOLN
4.0000 mg | Freq: Once | INTRAVENOUS | Status: AC
  Administered 2017-03-16: 4 mg via INTRAVENOUS
  Filled 2017-03-16: qty 1

## 2017-03-16 MED ORDER — ONDANSETRON HCL 4 MG/2ML IJ SOLN
4.0000 mg | Freq: Once | INTRAMUSCULAR | Status: AC
  Administered 2017-03-16: 4 mg via INTRAVENOUS
  Filled 2017-03-16: qty 2

## 2017-03-16 MED ORDER — MIDAZOLAM HCL 2 MG/2ML IJ SOLN
1.0000 mg | Freq: Once | INTRAMUSCULAR | Status: AC
  Administered 2017-03-16: 1 mg via INTRAVENOUS
  Filled 2017-03-16: qty 2

## 2017-03-16 MED ORDER — ETOMIDATE 2 MG/ML IV SOLN
10.0000 mg | Freq: Once | INTRAVENOUS | Status: AC
  Administered 2017-03-16: 10 mg via INTRAVENOUS
  Filled 2017-03-16: qty 10

## 2017-03-16 NOTE — ED Provider Notes (Addendum)
[facility] DEPT Provider Note   CSN: [id number] Arrival date & time: 03/16/17  1500     History   Chief Complaint Chief Complaint  Patient presents with  . Ankle Pain    HPI [name] is a 71 y.o. female.  Pt presents to the ED today with right ankle pain.  The pt said she slipped on some stairs at her apartment.  The pt said that her low back hurts a little, but she has a hx of lbp.  The pt was brought here by EMS who report a right ankle deformity.      Past Medical History:  Diagnosis Date  . Allergy   . Chronic hepatitis C ([place])   . Colon polyp   . COPD (chronic obstructive pulmonary disease) ([place])   . Diverticulitis   . Emphysema of lung ([place])   . Gallstones   . GERD (gastroesophageal reflux disease)   . Hypertension   . Kidney stones   . Osteoarthritis   . Spondylosis of cervical region without myelopathy or radiculopathy     Patient Active Problem List   Diagnosis Date Noted  . Depression with anxiety 12/25/2011  . HTN (hypertension) 12/25/2011  . GERD (gastroesophageal reflux disease) 12/25/2011  . COPD, moderate ([place]) 12/25/2011  . Chronic constipation 12/25/2011    Past Surgical History:  Procedure Laterality Date  . ABDOMINAL HYSTERECTOMY  1980  . BREAST BIOPSY  02/07/2010  . BREAST SURGERY  2011   biopsy  . CHOLECYSTECTOMY  1999  . TONSILLECTOMY AND ADENOIDECTOMY  1950's    OB History    No data available       Home Medications    Prior to Admission medications   Medication Sig Start Date End Date Taking? Authorizing Provider  albuterol (PROVENTIL HFA;VENTOLIN HFA) 108 (90 BASE) MCG/ACT inhaler Inhale 2 puffs into the lungs every 6 (six) hours as needed.    [provider]  amLODipine (NORVASC) 5 MG tablet Take 1 tablet by mouth daily. 11/11/14   [provider]  cholecalciferol (VITAMIN D) 400 UNITS TABS Take 1,000 Units by mouth daily.    [provider]  dexlansoprazole (DEXILANT) 60 MG capsule  Take 60 mg by mouth daily.    [provider]  diclofenac sodium (VOLTAREN) 1 % GEL Place 4 g onto the skin 4 (four) times a day as needed. 05/27/12   [provider]  docusate sodium (COLACE) 100 MG capsule Take 200 mg by mouth 2 (two) times daily.    [provider]  estradiol (ESTRACE) 0.5 MG tablet 0.5 mg. Once daily 04/21/13   [provider]  fluticasone (FLONASE) 50 MCG/ACT nasal spray Place 2 sprays into the nose as needed.     [provider]  hydrochlorothiazide (HYDRODIURIL) 25 MG tablet Take 1 tablet (25 mg total) by mouth daily. 07/20/13   Parrett, Fonnie Mu, NP  Loratadine 10 MG CAPS Take 10 mg by mouth.    [provider]  montelukast (SINGULAIR) 10 MG tablet Take 10 mg by mouth at bedtime.    [provider]  Multiple Vitamin (MULTIVITAMIN) tablet Take 1 tablet by mouth daily.    [provider]  nicotine (NICOTROL) 10 MG inhaler Inhale 1 cartridge (1 continuous puffing total) into the lungs as needed for smoking cessation. 07/20/13   Parrett, Fonnie Mu, NP  oxyCODONE-acetaminophen (PERCOCET/ROXICET) 5-325 MG tablet Take 1 tablet by mouth every 4 (four) hours as needed for severe pain. 03/16/17   Isla Pence,  MD  tiotropium (SPIRIVA) 18 MCG inhalation capsule Place 18 mcg into inhaler and inhale daily.    [provider]    Family History Family History  Problem Relation Age of Onset  . Heart disease Mother   . Stroke Brother   . Stroke Maternal Uncle   . COPD Paternal Uncle     Social History Social History  Substance Use Topics  . Smoking status: Current Every Day Smoker    Packs/day: 0.50    Years: 53.00    Types: Cigarettes    Start date: 07/20/1960  . Smokeless tobacco: Not on file  . Alcohol use 0.0 oz/week     Comment: occiasionally     Allergies   Chantix [varenicline] and Codeine   Review of Systems Review of Systems  Musculoskeletal: Positive for back pain.       Right  ankle pain  All other systems reviewed and are negative.    Physical Exam Updated Vital Signs BP 95/69 (BP Location: Right Arm)   Pulse 88   Temp 97.8 F (36.6 C) (Oral)   Resp 16   Ht 5\' 1"  (1.549 m)   Wt 72.6 kg (160 lb)   SpO2 100%   BMI 30.23 kg/m   Physical Exam  Constitutional: She is oriented to person, place, and time. She appears well-developed and well-nourished.  HENT:  Head: Normocephalic and atraumatic.  Right Ear: External ear normal.  Left Ear: External ear normal.  Nose: Nose normal.  Mouth/Throat: Oropharynx is clear and moist.  Eyes: Pupils are equal, round, and reactive to light. Conjunctivae and EOM are normal.  Neck: Normal range of motion. Neck supple.  Cardiovascular: Normal rate, regular rhythm, normal heart sounds and intact distal pulses.   Pulmonary/Chest: Effort normal and breath sounds normal.  Abdominal: Soft. Bowel sounds are normal.  Musculoskeletal:       Right ankle: She exhibits deformity. She exhibits normal pulse. Tenderness.  Neurological: She is alert and oriented to person, place, and time.  Skin: Skin is warm.  Psychiatric: She has a normal mood and affect. Her behavior is normal. Judgment and thought content normal.  Nursing note and vitals reviewed.    ED Treatments / Results  Labs (all labs ordered are listed, but only abnormal results are displayed) Labs Reviewed - No data to display  EKG  EKG Interpretation None       Radiology Dg Lumbar Spine Complete  Result Date: 03/16/2017 CLINICAL DATA:  Patient fell down steps today. Left lower back pain. EXAM: LUMBAR SPINE - COMPLETE 4+ VIEW COMPARISON:  07/16/2015 FINDINGS: Five non ribbed lumbar vertebrae with slight dextroconvex curvature of the lumbar spine. Maintained lumbar lordosis. Multilevel degenerative disc space narrowing and endplate spurring consistent lumbar spondylosis is again noted with associated facet sclerosis and joint space narrowing more prominently at  L4-5 and L5-S1. No acute fracture or pars defects. Aorto iliac atherosclerosis without aneurysm. Cholecystectomy clips are noted in the right upper abdomen. IMPRESSION: 1. Lumbar spondylosis, lower lumbar facet arthropathy at L4-5 and L5-S1 and mild dextroconvex curvature of the lumbar spine. No acute fracture nor posttraumatic subluxation. Electronically Signed   By: Ashley Royalty M.D.   On: 03/16/2017 16:30   Dg Ankle Complete Right  Result Date: 03/16/2017 CLINICAL DATA:  Fall. EXAM: RIGHT ANKLE - COMPLETE 3+ VIEW COMPARISON:  None. FINDINGS: Acute bimalleolar fracture is identified. There is lateral angulation of the distal fracture fragments. Diffuse soft tissue swelling noted. IMPRESSION: 1. Exam positive for acute bimalleolar  fracture with lateral angulation of the distal fracture fragments. Electronically Signed   By: Kerby Moors M.D.   On: 03/16/2017 16:28   Dg Ankle Right Port  Result Date: 03/16/2017 CLINICAL DATA:  Status post reduction EXAM: PORTABLE RIGHT ANKLE - 2 VIEW COMPARISON:  Earlier today FINDINGS: Exam detail is diminished secondary to overlying casting material. There has been interval close reduction of the bimalleolar fracture involving the right ankle. IMPRESSION: Status post close reduction of bimalleolar ankle fracture. Electronically Signed   By: Kerby Moors M.D.   On: 03/16/2017 17:27    Procedures .Sedation Date/Time: 03/17/2017 9:38 AM Performed by: Isla Pence Authorized by: Isla Pence   Consent:    Consent obtained:  Written   Consent given by:  Patient   Risks discussed:  Prolonged hypoxia resulting in organ damage, prolonged sedation necessitating reversal, allergic reaction, inadequate sedation, nausea and vomiting   Alternatives discussed:  Analgesia without sedation Indications:    Procedure performed:  Fracture reduction   Procedure necessitating sedation performed by:  Physician performing sedation   Intended level of sedation:  Moderate  (conscious sedation) Pre-sedation assessment:    Time since last food or drink:  3 hours   ASA classification: class 2 - patient with mild systemic disease     Neck mobility: normal     Mouth opening:  2 finger widths   Mallampati score:  II - soft palate, uvula, fauces visible   Pre-sedation assessments completed and reviewed: airway patency, cardiovascular function, hydration status, mental status, nausea/vomiting, pain level, respiratory function and temperature   Immediate pre-procedure details:    Reviewed: vital signs     Verified: bag valve mask available, emergency equipment available, intubation equipment available, IV patency confirmed, oxygen available and reversal medications available   Procedure details (see MAR for exact dosages):    Preoxygenation:  Nasal cannula   Sedation:  Midazolam and etomidate   Analgesia:  Morphine   Intra-procedure monitoring:  Blood pressure monitoring, cardiac monitor, continuous capnometry, continuous pulse oximetry, frequent LOC assessments and frequent vital sign checks   Intra-procedure events: hypoxia     Intra-procedure management:  Airway repositioning and supplemental oxygen   Total Provider sedation time (minutes):  30 Post-procedure details:    Attendance: Constant attendance by certified staff until patient recovered     Recovery: Patient returned to pre-procedure baseline     Post-sedation assessments completed and reviewed: airway patency, cardiovascular function, hydration status, mental status, nausea/vomiting, pain level, respiratory function and temperature     Patient is stable for discharge or admission: yes     Patient tolerance:  Tolerated well, no immediate complications Comments:     After given versed and etomidate, pt's O2 sat dropped briefly to mid 80s (few seconds only).  Pt stimulated and given additional oxygen.  Pt more awake and O2 sat went back to normal. Reduction of fracture Date/Time: 03/17/2017 9:41  AM Performed by: Isla Pence Authorized by: Isla Pence  Consent: Written consent obtained. Risks and benefits: risks, benefits and alternatives were discussed Consent given by: patient Patient understanding: patient states understanding of the procedure being performed Patient consent: the patient's understanding of the procedure matches consent given Procedure consent: procedure consent matches procedure scheduled Relevant documents: relevant documents present and verified Test results: test results available and properly labeled Site marked: the operative site was marked Imaging studies: imaging studies available Patient identity confirmed: verbally with patient Time out: Immediately prior to procedure a "time out" was called to verify  the correct patient, procedure, equipment, support staff and site/side marked as required. Preparation: Patient was prepped and draped in the usual sterile fashion.  Sedation: Patient sedated: yes Sedation type: moderate (conscious) sedation Sedatives: midazolam and etomidate Analgesia: morphine Vitals: Vital signs were monitored during sedation. Patient tolerance: Patient tolerated the procedure well with no immediate complications  .Splint Application Date/Time: 4/74/2595 10:37 AM Performed by: Isla Pence Authorized by: Isla Pence   Consent:    Consent obtained:  Written   Consent given by:  Patient   Risks discussed:  Discoloration, numbness, pain and swelling   Alternatives discussed:  No treatment Pre-procedure details:    Sensation:  Normal   Skin color:  Pink Procedure details:    Laterality:  Right   Location:  Ankle   Ankle:  R ankle   Splint type:  Short leg   Supplies:  Cotton padding, elastic bandage and plaster Post-procedure details:    Pain:  Improved   Sensation:  Normal   Skin color:  Pink   Patient tolerance of procedure:  Tolerated well, no immediate complications .Splint Application Date/Time:  03/17/2017 10:38 AM Performed by: Isla Pence Authorized by: Isla Pence   Consent:    Consent obtained:  Verbal   Consent given by:  Patient   Risks discussed:  Discoloration, numbness, pain and swelling   Alternatives discussed:  No treatment Pre-procedure details:    Sensation:  Normal   Skin color:  Pink Procedure details:    Laterality:  Right   Location:  Ankle   Ankle:  R ankle   Splint type:  Ankle stirrup   Supplies:  Cotton padding, elastic bandage and plaster Post-procedure details:    Pain:  Improved   Sensation:  Normal   Skin color:  Pink   Patient tolerance of procedure:  Tolerated well, no immediate complications   (including critical care time)  Medications Ordered in ED Medications  morphine 4 MG/ML injection 4 mg (4 mg Intravenous Given 03/16/17 1535)  ondansetron (ZOFRAN) injection 4 mg (4 mg Intravenous Given 03/16/17 1531)  etomidate (AMIDATE) injection 10 mg (10 mg Intravenous Given 03/16/17 1702)  midazolam (VERSED) injection 1 mg (1 mg Intravenous Given 03/16/17 1701)  ondansetron (ZOFRAN) injection 4 mg (4 mg Intravenous Given by Other 03/16/17 1729)  oxyCODONE-acetaminophen (PERCOCET/ROXICET) 5-325 MG per tablet 1 tablet (1 tablet Oral Given 03/16/17 2022)     Initial Impression / Assessment and Plan / ED Course  I have reviewed the triage vital signs and the nursing notes.  Pertinent labs & imaging results that were available during my care of the patient were reviewed by me and considered in my medical decision making (see chart for details).    Pt given rx for a knee walker and a regular walker.  I tried to get one delivered here, but advanced home care is not here on the weekend.   She is given crutches here.  She is a patient of [facility] and is instructed to call on Monday the 17th.  She knows to return for any concerns.  Final Clinical Impressions(s) / ED Diagnoses   Final diagnoses:  Closed bimalleolar fracture of right ankle,  initial encounter    New Prescriptions Discharge Medication List as of 03/16/2017  5:10 PM    START taking these medications   Details  oxyCODONE-acetaminophen (PERCOCET/ROXICET) 5-325 MG tablet Take 1 tablet by mouth every 4 (four) hours as needed for severe pain., Starting Sat 03/16/2017, Print  Isla Pence, MD 03/17/17 9191    Isla Pence, MD 03/17/17 1038

## 2017-03-16 NOTE — ED Notes (Signed)
Pt upset with treatment today, I inquired what we could do better pt did not want to talk about it.  Called pt a taxi per her request. Attempted to do discharge teaching with crutches, pt did not want to be bothered.

## 2017-03-16 NOTE — ED Notes (Signed)
Patient transported to X-ray 

## 2017-03-16 NOTE — ED Triage Notes (Signed)
Pt BIB EMS after slipping on a stair at her apartment complex and injured right ankle. EMS reports deformity to the right ankle. Not other complaints at this time  EMS VS: 170/110, HR 90%, RR 16, CBG 218

## 2017-03-16 NOTE — ED Notes (Signed)
Bed: AE82 Expected date: 03/16/17 Expected time: 2:41 PM Means of arrival:  Comments: EMS 71 y/o ankle deformity

## 2017-03-16 NOTE — Progress Notes (Signed)
RT, [name] present for Conscious Sedation. Sp02 on 2 lpm- 98%, End-tidal C02 35 mmHg. PT did have to be stimulated and bagged briefly. RN remained at bedside.

## 2017-03-19 ENCOUNTER — Encounter (HOSPITAL_BASED_OUTPATIENT_CLINIC_OR_DEPARTMENT_OTHER): Payer: Self-pay | Admitting: *Deleted

## 2017-03-19 ENCOUNTER — Other Ambulatory Visit: Payer: Self-pay | Admitting: Orthopedic Surgery

## 2017-03-20 ENCOUNTER — Ambulatory Visit (HOSPITAL_BASED_OUTPATIENT_CLINIC_OR_DEPARTMENT_OTHER): Payer: Medicare Other | Admitting: Anesthesiology

## 2017-03-20 ENCOUNTER — Encounter (HOSPITAL_BASED_OUTPATIENT_CLINIC_OR_DEPARTMENT_OTHER)
Admission: RE | Disposition: A | Discharge status: Home / Self Care | Payer: Self-pay | Source: Ambulatory Visit | Attending: Orthopedic Surgery

## 2017-03-20 ENCOUNTER — Encounter (HOSPITAL_BASED_OUTPATIENT_CLINIC_OR_DEPARTMENT_OTHER): Payer: Self-pay | Admitting: *Deleted

## 2017-03-20 ENCOUNTER — Ambulatory Visit (HOSPITAL_BASED_OUTPATIENT_CLINIC_OR_DEPARTMENT_OTHER)
Admission: RE | Admit: 2017-03-20 | Discharge: 2017-03-21 | Disposition: A | Discharge status: Home / Self Care | Payer: Medicare Other | Source: Ambulatory Visit | Attending: Orthopedic Surgery | Admitting: Orthopedic Surgery

## 2017-03-20 DIAGNOSIS — X58XXXA Exposure to other specified factors, initial encounter: Secondary | ICD-10-CM | POA: Insufficient documentation

## 2017-03-20 DIAGNOSIS — Z79899 Other long term (current) drug therapy: Secondary | ICD-10-CM | POA: Insufficient documentation

## 2017-03-20 DIAGNOSIS — S82841A Displaced bimalleolar fracture of right lower leg, initial encounter for closed fracture: Secondary | ICD-10-CM | POA: Diagnosis not present

## 2017-03-20 DIAGNOSIS — J439 Emphysema, unspecified: Secondary | ICD-10-CM | POA: Insufficient documentation

## 2017-03-20 DIAGNOSIS — I10 Essential (primary) hypertension: Secondary | ICD-10-CM | POA: Insufficient documentation

## 2017-03-20 DIAGNOSIS — F17210 Nicotine dependence, cigarettes, uncomplicated: Secondary | ICD-10-CM | POA: Diagnosis not present

## 2017-03-20 DIAGNOSIS — S82891A Other fracture of right lower leg, initial encounter for closed fracture: Secondary | ICD-10-CM | POA: Diagnosis present

## 2017-03-20 DIAGNOSIS — Z79890 Hormone replacement therapy (postmenopausal): Secondary | ICD-10-CM | POA: Insufficient documentation

## 2017-03-20 HISTORY — PX: ORIF ANKLE FRACTURE: SHX5408

## 2017-03-20 HISTORY — DX: Other fracture of unspecified lower leg, initial encounter for closed fracture: S82.899A

## 2017-03-20 HISTORY — DX: Displaced bimalleolar fracture of right lower leg, initial encounter for closed fracture: S82.841A

## 2017-03-20 LAB — POCT I-STAT, CHEM 8
BUN: 20 mg/dL (ref 6–20)
CREATININE: 1 mg/dL (ref 0.44–1.00)
Calcium, Ion: 1.17 mmol/L (ref 1.15–1.40)
Chloride: 105 mmol/L (ref 101–111)
GLUCOSE: 109 mg/dL — AB (ref 65–99)
HCT: 36 % (ref 36.0–46.0)
HEMOGLOBIN: 12.2 g/dL (ref 12.0–15.0)
POTASSIUM: 4.1 mmol/L (ref 3.5–5.1)
Sodium: 143 mmol/L (ref 135–145)
TCO2: 26 mmol/L (ref 22–32)

## 2017-03-20 SURGERY — OPEN REDUCTION INTERNAL FIXATION (ORIF) ANKLE FRACTURE
Anesthesia: Regional | Site: Ankle | Laterality: Right

## 2017-03-20 MED ORDER — MONTELUKAST SODIUM 10 MG PO TABS
10.0000 mg | ORAL_TABLET | Freq: Every day | ORAL | Status: DC
  Administered 2017-03-20: 10 mg via ORAL

## 2017-03-20 MED ORDER — ONDANSETRON HCL 4 MG/2ML IJ SOLN: 4.0000 mg | Freq: Four times a day (QID) | INTRAMUSCULAR | Status: DC | PRN

## 2017-03-20 MED ORDER — MIDAZOLAM HCL 2 MG/2ML IJ SOLN
INTRAMUSCULAR | Status: AC
  Filled 2017-03-20: qty 2

## 2017-03-20 MED ORDER — PROMETHAZINE HCL 25 MG/ML IJ SOLN: 6.2500 mg | INTRAMUSCULAR | Status: DC | PRN

## 2017-03-20 MED ORDER — CEFAZOLIN SODIUM-DEXTROSE 2-4 GM/100ML-% IV SOLN
INTRAVENOUS | Status: AC
  Filled 2017-03-20: qty 100

## 2017-03-20 MED ORDER — HYDROMORPHONE HCL 2 MG PO TABS: 2.0000 mg | ORAL_TABLET | Freq: Four times a day (QID) | ORAL | 0 refills | Status: DC | PRN

## 2017-03-20 MED ORDER — PHENYLEPHRINE HCL 10 MG/ML IJ SOLN
INTRAMUSCULAR | Status: DC | PRN
  Administered 2017-03-20 (×2): 100 ug via INTRAVENOUS

## 2017-03-20 MED ORDER — LACTATED RINGERS IV SOLN
INTRAVENOUS | Status: DC
  Administered 2017-03-20 (×2): via INTRAVENOUS

## 2017-03-20 MED ORDER — METHOCARBAMOL 1000 MG/10ML IJ SOLN: 500.0000 mg | Freq: Four times a day (QID) | INTRAVENOUS | Status: DC | PRN

## 2017-03-20 MED ORDER — CEFAZOLIN SODIUM-DEXTROSE 2-4 GM/100ML-% IV SOLN
2.0000 g | Freq: Four times a day (QID) | INTRAVENOUS | Status: AC
  Administered 2017-03-20: 2 g via INTRAVENOUS
  Filled 2017-03-20: qty 100

## 2017-03-20 MED ORDER — MIDAZOLAM HCL 2 MG/2ML IJ SOLN
1.0000 mg | INTRAMUSCULAR | Status: DC | PRN
  Administered 2017-03-20: 2 mg via INTRAVENOUS

## 2017-03-20 MED ORDER — ONDANSETRON HCL 4 MG/2ML IJ SOLN
INTRAMUSCULAR | Status: DC | PRN
  Administered 2017-03-20: 4 mg via INTRAVENOUS

## 2017-03-20 MED ORDER — FENTANYL CITRATE (PF) 100 MCG/2ML IJ SOLN
INTRAMUSCULAR | Status: AC
  Filled 2017-03-20: qty 2

## 2017-03-20 MED ORDER — EPHEDRINE SULFATE 50 MG/ML IJ SOLN
INTRAMUSCULAR | Status: DC | PRN
  Administered 2017-03-20 (×3): 10 mg via INTRAVENOUS

## 2017-03-20 MED ORDER — LIDOCAINE HCL (CARDIAC) 20 MG/ML IV SOLN
INTRAVENOUS | Status: DC | PRN
  Administered 2017-03-20: 100 mg via INTRAVENOUS

## 2017-03-20 MED ORDER — CEFAZOLIN SODIUM-DEXTROSE 2-4 GM/100ML-% IV SOLN
2.0000 g | INTRAVENOUS | Status: AC
  Administered 2017-03-20: 2 g via INTRAVENOUS

## 2017-03-20 MED ORDER — LIDOCAINE HCL (PF) 2 % IJ SOLN
INTRAMUSCULAR | Status: DC | PRN
  Administered 2017-03-20: 5 mL via INTRADERMAL
  Administered 2017-03-20: 5 mL via PERINEURAL

## 2017-03-20 MED ORDER — FESOTERODINE FUMARATE ER 8 MG PO TB24
8.0000 mg | ORAL_TABLET | Freq: Every day | ORAL | Status: DC
  Administered 2017-03-20: 8 mg via ORAL

## 2017-03-20 MED ORDER — BUPIVACAINE-EPINEPHRINE (PF) 0.5% -1:200000 IJ SOLN
INTRAMUSCULAR | Status: DC | PRN
  Administered 2017-03-20: 25 mL
  Administered 2017-03-20: 15 mL via PERINEURAL

## 2017-03-20 MED ORDER — ALBUTEROL SULFATE HFA 108 (90 BASE) MCG/ACT IN AERS: 2.0000 | INHALATION_SPRAY | Freq: Four times a day (QID) | RESPIRATORY_TRACT | Status: DC | PRN

## 2017-03-20 MED ORDER — ZOLPIDEM TARTRATE 5 MG PO TABS: 5.0000 mg | ORAL_TABLET | Freq: Every evening | ORAL | Status: DC | PRN

## 2017-03-20 MED ORDER — AMLODIPINE BESYLATE 5 MG PO TABS
5.0000 mg | ORAL_TABLET | Freq: Every day | ORAL | Status: DC
  Administered 2017-03-20: 5 mg via ORAL

## 2017-03-20 MED ORDER — HYDROMORPHONE HCL 1 MG/ML IJ SOLN: 0.5000 mg | INTRAMUSCULAR | Status: DC | PRN

## 2017-03-20 MED ORDER — PROPOFOL 10 MG/ML IV BOLUS
INTRAVENOUS | Status: DC | PRN
  Administered 2017-03-20: 150 mg via INTRAVENOUS

## 2017-03-20 MED ORDER — CHLORHEXIDINE GLUCONATE 4 % EX LIQD: 60.0000 mL | Freq: Once | CUTANEOUS | Status: DC

## 2017-03-20 MED ORDER — FENTANYL CITRATE (PF) 100 MCG/2ML IJ SOLN
50.0000 ug | INTRAMUSCULAR | Status: DC | PRN
  Administered 2017-03-20: 50 ug via INTRAVENOUS

## 2017-03-20 MED ORDER — METHOCARBAMOL 500 MG PO TABS: 500.0000 mg | ORAL_TABLET | Freq: Four times a day (QID) | ORAL | Status: DC | PRN

## 2017-03-20 MED ORDER — HYDROMORPHONE HCL 2 MG PO TABS: 2.0000 mg | ORAL_TABLET | ORAL | Status: DC | PRN

## 2017-03-20 MED ORDER — MEPERIDINE HCL 25 MG/ML IJ SOLN: 6.2500 mg | INTRAMUSCULAR | Status: DC | PRN

## 2017-03-20 MED ORDER — ONDANSETRON HCL 4 MG PO TABS: 4.0000 mg | ORAL_TABLET | Freq: Four times a day (QID) | ORAL | Status: DC | PRN

## 2017-03-20 MED ORDER — SODIUM CHLORIDE 0.9 % IV SOLN
INTRAVENOUS | Status: DC
  Administered 2017-03-20: 16:00:00 via INTRAVENOUS

## 2017-03-20 MED ORDER — DEXAMETHASONE SODIUM PHOSPHATE 4 MG/ML IJ SOLN
INTRAMUSCULAR | Status: DC | PRN
  Administered 2017-03-20: 10 mg via INTRAVENOUS

## 2017-03-20 MED ORDER — SCOPOLAMINE 1 MG/3DAYS TD PT72: 1.0000 | MEDICATED_PATCH | Freq: Once | TRANSDERMAL | Status: DC | PRN

## 2017-03-20 SURGICAL SUPPLY — 80 items
BANDAGE ACE 4X5 VEL STRL LF (GAUZE/BANDAGES/DRESSINGS) ×3 IMPLANT
BANDAGE ACE 6X5 VEL STRL LF (GAUZE/BANDAGES/DRESSINGS) IMPLANT
BANDAGE ESMARK 6X9 LF (GAUZE/BANDAGES/DRESSINGS) IMPLANT
BENZOIN TINCTURE PRP APPL 2/3 (GAUZE/BANDAGES/DRESSINGS) IMPLANT
BIT DRILL 2.5X2.75 QC CALB (BIT) ×3 IMPLANT
BIT DRILL 2.9 CANN QC NONSTRL (BIT) ×3 IMPLANT
BIT DRILL 3.5X5.5 QC CALB (BIT) ×3 IMPLANT
BLADE SURG 15 STRL LF DISP TIS (BLADE) ×1 IMPLANT
BLADE SURG 15 STRL SS (BLADE) ×2
BNDG ESMARK 4X9 LF (GAUZE/BANDAGES/DRESSINGS) ×3 IMPLANT
BNDG ESMARK 6X9 LF (GAUZE/BANDAGES/DRESSINGS)
CANISTER SUCT 1200ML W/VALVE (MISCELLANEOUS) IMPLANT
CLOSURE WOUND 1/2 X4 (GAUZE/BANDAGES/DRESSINGS)
COVER BACK TABLE 60X90IN (DRAPES) ×3 IMPLANT
CUFF TOURNIQUET SINGLE 18IN (TOURNIQUET CUFF) IMPLANT
DECANTER SPIKE VIAL GLASS SM (MISCELLANEOUS) IMPLANT
DRAPE EXTREMITY T 121X128X90 (DRAPE) ×3 IMPLANT
DRAPE IMP U-DRAPE 54X76 (DRAPES) ×3 IMPLANT
DRAPE OEC MINIVIEW 54X84 (DRAPES) ×3 IMPLANT
DRAPE U-SHAPE 47X51 STRL (DRAPES) IMPLANT
DRSG EMULSION OIL 3X3 NADH (GAUZE/BANDAGES/DRESSINGS) ×3 IMPLANT
DURAPREP 26ML APPLICATOR (WOUND CARE) ×3 IMPLANT
ELECT REM PT RETURN 9FT ADLT (ELECTROSURGICAL) ×3
ELECTRODE REM PT RTRN 9FT ADLT (ELECTROSURGICAL) ×1 IMPLANT
GAUZE SPONGE 4X4 12PLY STRL (GAUZE/BANDAGES/DRESSINGS) ×3 IMPLANT
GAUZE SPONGE 4X4 16PLY XRAY LF (GAUZE/BANDAGES/DRESSINGS) IMPLANT
GLOVE BIOGEL M STRL SZ7.5 (GLOVE) ×3 IMPLANT
GLOVE BIOGEL PI IND STRL 7.0 (GLOVE) ×1 IMPLANT
GLOVE BIOGEL PI IND STRL 8 (GLOVE) ×3 IMPLANT
GLOVE BIOGEL PI INDICATOR 7.0 (GLOVE) ×2
GLOVE BIOGEL PI INDICATOR 8 (GLOVE) ×6
GLOVE ECLIPSE 7.5 STRL STRAW (GLOVE) ×9 IMPLANT
GOWN STRL REUS W/ TWL LRG LVL3 (GOWN DISPOSABLE) ×1 IMPLANT
GOWN STRL REUS W/ TWL XL LVL3 (GOWN DISPOSABLE) ×1 IMPLANT
GOWN STRL REUS W/TWL LRG LVL3 (GOWN DISPOSABLE) ×2
GOWN STRL REUS W/TWL XL LVL3 (GOWN DISPOSABLE) ×8 IMPLANT
K-WIRE ACE 1.6X6 (WIRE) ×6
KWIRE ACE 1.6X6 (WIRE) ×2 IMPLANT
NEEDLE HYPO 22GX1.5 SAFETY (NEEDLE) IMPLANT
NS IRRIG 1000ML POUR BTL (IV SOLUTION) ×3 IMPLANT
PACK BASIN DAY SURGERY FS (CUSTOM PROCEDURE TRAY) ×3 IMPLANT
PAD CAST 4YDX4 CTTN HI CHSV (CAST SUPPLIES) ×1 IMPLANT
PADDING CAST ABS 4INX4YD NS (CAST SUPPLIES) ×2
PADDING CAST ABS COTTON 4X4 ST (CAST SUPPLIES) ×1 IMPLANT
PADDING CAST COTTON 4X4 STRL (CAST SUPPLIES) ×2
PADDING CAST COTTON 6X4 STRL (CAST SUPPLIES) IMPLANT
PENCIL BUTTON HOLSTER BLD 10FT (ELECTRODE) ×3 IMPLANT
PLATE ACE 100DEG 6HOLE (Plate) ×3 IMPLANT
SCREW ACE CAN 4.0 50M (Screw) ×6 IMPLANT
SCREW CORTICAL 3.5MM  12MM (Screw) ×2 IMPLANT
SCREW CORTICAL 3.5MM  16MM (Screw) ×2 IMPLANT
SCREW CORTICAL 3.5MM 12MM (Screw) ×1 IMPLANT
SCREW CORTICAL 3.5MM 14MM (Screw) ×3 IMPLANT
SCREW CORTICAL 3.5MM 16MM (Screw) ×1 IMPLANT
SCREW CORTICAL 3.5MM 18MM (Screw) ×3 IMPLANT
SCREW NLOCK CANC HEX 4X12 (Screw) ×3 IMPLANT
SCREW NLOCK CANC HEX 4X16 (Screw) ×3 IMPLANT
SPLINT FAST PLASTER 5X30 (CAST SUPPLIES)
SPLINT FIBERGLASS 4X30 (CAST SUPPLIES) ×3 IMPLANT
SPLINT PLASTER CAST FAST 5X30 (CAST SUPPLIES) IMPLANT
SPONGE LAP 4X18 X RAY DECT (DISPOSABLE) IMPLANT
STAPLER VISISTAT (STAPLE) IMPLANT
STOCKINETTE 6  STRL (DRAPES) ×2
STOCKINETTE 6 STRL (DRAPES) ×1 IMPLANT
STRIP CLOSURE SKIN 1/2X4 (GAUZE/BANDAGES/DRESSINGS) IMPLANT
SUCTION FRAZIER HANDLE 10FR (MISCELLANEOUS) ×2
SUCTION TUBE FRAZIER 10FR DISP (MISCELLANEOUS) ×1 IMPLANT
SUT ETHILON 3 0 PS 1 (SUTURE) IMPLANT
SUT ETHILON 4 0 PS 2 18 (SUTURE) IMPLANT
SUT MON AB 4-0 PC3 18 (SUTURE) IMPLANT
SUT VIC AB 2-0 SH 27 (SUTURE) ×2
SUT VIC AB 2-0 SH 27XBRD (SUTURE) ×1 IMPLANT
SUT VIC AB 3-0 FS2 27 (SUTURE) IMPLANT
SUT VICRYL 4-0 PS2 18IN ABS (SUTURE) IMPLANT
SYR 20CC LL (SYRINGE) IMPLANT
SYR BULB 3OZ (MISCELLANEOUS) ×3 IMPLANT
TOWEL OR NON WOVEN STRL DISP B (DISPOSABLE) ×3 IMPLANT
TUBE CONNECTING 20'X1/4 (TUBING)
TUBE CONNECTING 20X1/4 (TUBING) IMPLANT
UNDERPAD 30X30 (UNDERPADS AND DIAPERS) ×3 IMPLANT

## 2017-03-20 NOTE — Anesthesia Procedure Notes (Signed)
Procedure Name: LMA Insertion Date/Time: 03/20/2017 1:03 PM Performed by: Maryella Shivers Pre-anesthesia Checklist: Patient identified, Emergency Drugs available, Suction available and Patient being monitored Patient Re-evaluated:Patient Re-evaluated prior to induction Oxygen Delivery Method: Circle system utilized Preoxygenation: Pre-oxygenation with 100% oxygen Induction Type: IV induction Ventilation: Mask ventilation without difficulty LMA: LMA inserted LMA Size: 4.0 Number of attempts: 1 Airway Equipment and Method: Bite block Placement Confirmation: positive ETCO2 Tube secured with: Tape Dental Injury: Teeth and Oropharynx as per pre-operative assessment

## 2017-03-20 NOTE — Transfer of Care (Signed)
Immediate Anesthesia Transfer of Care Note  Patient: [name]  Procedure(s) Performed: Procedure(s): OPEN REDUCTION INTERNAL FIXATION (ORIF) ANKLE FRACTURE (Right)  Patient Location: PACU  Anesthesia Type:GA combined with regional for post-op pain  Level of Consciousness: sedated  Airway & Oxygen Therapy: Patient Spontanous Breathing and Patient connected to face mask oxygen  Post-op Assessment: Report given to RN and Post -op Vital signs reviewed and stable  Post vital signs: Reviewed and stable  Last Vitals:  Vitals:   03/20/17 1252 03/20/17 1432  BP:    Pulse: 84 93  Resp: (!) 23 14  Temp:  36.9 C  SpO2: 100% 99%    Last Pain:  Vitals:   03/20/17 1137  TempSrc: Oral  PainSc: 0-No pain         Complications: No apparent anesthesia complications

## 2017-03-20 NOTE — Anesthesia Procedure Notes (Signed)
Anesthesia Regional Block: Adductor canal block   Pre-Anesthetic Checklist: ,, timeout performed, Correct Patient, Correct Site, Correct Laterality, Correct Procedure, Correct Position, site marked, Risks and benefits discussed,  Surgical consent,  Pre-op evaluation,  At surgeon's request and post-op pain management  Laterality: Right  Prep: chloraprep       Needles:  Injection technique: Single-shot  Needle Type: Stimiplex     Needle Length: 9cm  Needle Gauge: 21     Additional Needles:   Procedures:,,,, ultrasound used (permanent image in chart),,,,  Narrative:  Start time: 03/20/2017 12:31 PM End time: 03/20/2017 12:34 PM Injection made incrementally with aspirations every 5 mL.  Performed by: Personally  Anesthesiologist: Nolon Nations  Additional Notes: BP cuff, EKG monitors applied. Sedation begun. Artery and nerve location verified with U/S and anesthetic injected incrementally, slowly, and after negative aspirations under direct u/s guidance. Good fascial /perineural spread. Tolerated well.

## 2017-03-20 NOTE — Anesthesia Procedure Notes (Signed)
Anesthesia Regional Block: Popliteal block   Pre-Anesthetic Checklist: ,, timeout performed, Correct Patient, Correct Site, Correct Laterality, Correct Procedure, Correct Position, site marked, Risks and benefits discussed,  Surgical consent,  Pre-op evaluation,  At surgeon's request and post-op pain management  Laterality: Right  Prep: chloraprep       Needles:  Injection technique: Single-shot  Needle Type: Stimiplex     Needle Length: 10cm  Needle Gauge: 21     Additional Needles:   Procedures:,,,, ultrasound used (permanent image in chart),,,,  Motor weakness within 5 minutes.  Narrative:  Start time: 03/20/2017 12:35 PM End time: 03/20/2017 12:40 PM Injection made incrementally with aspirations every 5 mL.  Performed by: Personally  Anesthesiologist: Nolon Nations  Additional Notes: Nerve located and needle positioned with direct ultrasound guidance. Good perineural spread. Patient tolerated well.

## 2017-03-20 NOTE — Progress Notes (Signed)
Assisted Dr. Germeroth with right, ultrasound guided, popliteal/saphenous block. Side rails up, monitors on throughout procedure. See vital signs in flow sheet. Tolerated Procedure well. 

## 2017-03-20 NOTE — Brief Op Note (Signed)
03/20/2017  2:00 PM  PATIENT:  [name]  71 y.o. female  PRE-OPERATIVE DIAGNOSIS:  RIGHT ANKLE DISPLACED BIMALLEOLAR FRACTURE  POST-OPERATIVE DIAGNOSIS:  RIGHT ANKLE DISPLACED BIMALLEOLAR FRACTURE  PROCEDURE:  Procedure(s): OPEN REDUCTION INTERNAL FIXATION (ORIF) ANKLE FRACTURE (Right)  SURGEON:  Surgeon(s) and Role:    Dorna Leitz, MD - Primary  PHYSICIAN ASSISTANT:   ASSISTANTS: jim bethune   ANESTHESIA:   general  EBL:  Total I/O In: 300 [I.V.:300] Out: -   BLOOD ADMINISTERED:none  DRAINS: none   LOCAL MEDICATIONS USED:  MARCAINE     SPECIMEN:  No Specimen  DISPOSITION OF SPECIMEN:  N/A  COUNTS:  YES  TOURNIQUET:  * Missing tourniquet times found for documented tourniquets in log:  [id number] *  DICTATION: .Other Dictation: Dictation Number [id number]  PLAN OF CARE: Discharge to home after PACU  PATIENT DISPOSITION:  PACU - hemodynamically stable.   Delay start of Pharmacological VTE agent (>24hrs) due to surgical blood loss or risk of bleeding: no

## 2017-03-20 NOTE — H&P (Signed)
PREOPERATIVE H&P  Chief Complaint: r ankle fracture and pain  HPI: [name] is a 71 y.o. female who presents for evaluation of  r ankle fracture and pain It has been present for 4 days and has been worsening. She has failed conservative measures. Pain is rated as moderate.  Past Medical History:  Diagnosis Date  . Allergy   . Ankle fracture    right  . Chronic hepatitis C ([place])   . Colon polyp   . COPD (chronic obstructive pulmonary disease) ([facility])   . Diverticulitis   . Emphysema of lung ([place])   . Gallstones   . GERD (gastroesophageal reflux disease)   . Hypertension   . Kidney stones   . Osteoarthritis   . Spondylosis of cervical region without myelopathy or radiculopathy    Past Surgical History:  Procedure Laterality Date  . ABDOMINAL HYSTERECTOMY  1980  . BREAST BIOPSY  02/07/2010  . BREAST SURGERY  2011   biopsy  . CHOLECYSTECTOMY  1999  . TONSILLECTOMY AND ADENOIDECTOMY  1950's   Social History   Social History  . Marital status: Legally Separated    Spouse name: N/A  . Number of children: 0  . Years of education: N/A   Occupational History  . retired    Social History Main Topics  . Smoking status: Current Every Day Smoker    Packs/day: 0.50    Years: 53.00    Types: Cigarettes    Start date: 07/20/1960  . Smokeless tobacco: Never Used  . Alcohol use 0.0 oz/week     Comment: occiasionally  . Drug use: No  . Sexual activity: Not Asked   Other Topics Concern  . None   Social History Narrative  . None   Family History  Problem Relation Age of Onset  . Heart disease Mother   . Stroke Brother   . Stroke Maternal Uncle   . COPD Paternal Uncle    Allergies  Allergen Reactions  . Chantix [Varenicline]     Makes her feel crazy  . Codeine     Itch    Prior to Admission medications   Medication Sig Start Date End Date Taking? Authorizing Provider  amLODipine (NORVASC) 5 MG tablet Take 1 tablet by mouth daily. 11/11/14  Yes [provider]  atorvastatin (LIPITOR) 20 MG tablet Take 20 mg by mouth daily.   Yes [provider]  estradiol (ESTRACE) 0.5 MG tablet 0.5 mg. Once daily 04/21/13  Yes [provider]  fesoterodine (TOVIAZ) 8 MG TB24 tablet Take 8 mg by mouth daily.   Yes [provider]  montelukast (SINGULAIR) 10 MG tablet Take 10 mg by mouth at bedtime.   Yes [provider]  traMADol (ULTRAM) 50 MG tablet Take by mouth every 6 (six) hours as needed.   Yes [provider]  albuterol (PROVENTIL HFA;VENTOLIN HFA) 108 (90 BASE) MCG/ACT inhaler Inhale 2 puffs into the lungs every 6 (six) hours as needed.    [provider]  dexlansoprazole (DEXILANT) 60 MG capsule Take 60 mg by mouth daily.    [provider]  HYDROmorphone (DILAUDID) 2 MG tablet Take by mouth every 4 (four) hours as needed for severe pain.    [provider]     Positive ROS: none  All other systems have been reviewed and were otherwise negative with the exception of those mentioned in the HPI and as above.  Physical Exam: Vitals:   03/20/17 1137  Temp: 98.1 F (  36.7 C)    General: Alert, no acute distress Cardiovascular: No pedal edema Respiratory: No cyanosis, no use of accessory musculature GI: No organomegaly, abdomen is soft and non-tender Skin: No lesions in the area of chief complaint Neurologic: Sensation intact distally Psychiatric: Patient is competent for consent with normal mood and affect Lymphatic: No axillary or cervical lymphadenopathy  MUSCULOSKELETAL: r ankle +stswelling, pain on ROM.  Assessment/Plan: RIGHT ANKLE DISPLACED BIMALLEOLAR FRACTURE Plan for Procedure(s): OPEN REDUCTION INTERNAL FIXATION (ORIF) ANKLE FRACTURE  The risks benefits and alternatives were discussed with the patient including but not limited to the risks of nonoperative treatment, versus surgical intervention including infection, bleeding, nerve injury, malunion,  nonunion, hardware prominence, hardware failure, need for hardware removal, blood clots, cardiopulmonary complications, morbidity, mortality, among others, and they were willing to proceed.  Predicted outcome is good, although there will be at least a six to nine month expected recovery.  Alta Corning, MD 03/20/2017 12:21 PM

## 2017-03-20 NOTE — Anesthesia Postprocedure Evaluation (Signed)
Anesthesia Post Note  Patient: Fish farm manager  Procedure(s) Performed: Procedure(s) (LRB): OPEN REDUCTION INTERNAL FIXATION (ORIF) ANKLE FRACTURE (Right)     Patient location during evaluation: PACU Anesthesia Type: Regional Level of consciousness: awake and alert Pain management: pain level controlled Vital Signs Assessment: post-procedure vital signs reviewed and stable Respiratory status: spontaneous breathing, nonlabored ventilation, respiratory function stable and patient connected to nasal cannula oxygen Cardiovascular status: blood pressure returned to baseline and stable Postop Assessment: no apparent nausea or vomiting Anesthetic complications: no    Last Vitals:  Vitals:   03/20/17 1537 03/20/17 1635  BP: (!) 112/59 122/70  Pulse: 87 88  Resp: 18 16  Temp: (!) 36.4 C 36.6 C  SpO2: 95% 94%    Last Pain:  Vitals:   03/20/17 1537  TempSrc:   PainSc: 0-No pain                 Zsofia Prout S

## 2017-03-20 NOTE — Anesthesia Preprocedure Evaluation (Addendum)
Anesthesia Evaluation  Patient identified by MRN, date of birth, ID band Patient awake    Reviewed: Allergy & Precautions, NPO status , Patient's Chart, lab work & pertinent test results  Airway Mallampati: II  TM Distance: >3 FB Neck ROM: Full    Dental no notable dental hx.    Pulmonary COPD, Current Smoker,    Pulmonary exam normal breath sounds clear to auscultation       Cardiovascular hypertension, Pt. on medications Normal cardiovascular exam Rhythm:Regular Rate:Normal     Neuro/Psych PSYCHIATRIC DISORDERS negative neurological ROS     GI/Hepatic negative GI ROS, GERD  ,(+) Hepatitis -, C  Endo/Other  negative endocrine ROS  Renal/GU Renal disease     Musculoskeletal negative musculoskeletal ROS (+)   Abdominal   Peds  Hematology negative hematology ROS (+)   Anesthesia Other Findings   Reproductive/Obstetrics negative OB ROS                             Anesthesia Physical Anesthesia Plan  ASA: III  Anesthesia Plan: Regional   Post-op Pain Management:    Induction:   PONV Risk Score and Plan:   Airway Management Planned:   Additional Equipment:   Intra-op Plan:   Post-operative Plan:   Informed Consent: I have reviewed the patients History and Physical, chart, labs and discussed the procedure including the risks, benefits and alternatives for the proposed anesthesia with the patient or authorized representative who has indicated his/her understanding and acceptance.   Dental advisory given  Plan Discussed with: CRNA  Anesthesia Plan Comments: (If a tourniquet is used, pt will most likely need GA)        Anesthesia Quick Evaluation

## 2017-03-21 ENCOUNTER — Encounter (HOSPITAL_BASED_OUTPATIENT_CLINIC_OR_DEPARTMENT_OTHER): Payer: Self-pay | Admitting: Orthopedic Surgery

## 2017-03-21 NOTE — Op Note (Signed)
NAMECARYN, GIENGER               ACCOUNT NO.:  [id number]  MEDICAL RECORD NO.:  [id number]  LOCATION:                                 FACILITY:  PHYSICIAN:  Alta Corning, M.D.   DATE OF BIRTH:  1946-06-01  DATE OF PROCEDURE:  03/20/2017 DATE OF DISCHARGE:                              OPERATIVE REPORT   PREOPERATIVE DIAGNOSIS:  Displaced bimalleolar ankle fracture, right.  POSTOPERATIVE DIAGNOSIS:  Displaced bimalleolar ankle fracture, right.  PROCEDURES: 1. Open reduction and internal fixation of right bimalleolar ankle     fracture, right side, with 2 cannulated screws medially and a 6-     hole 1/3 tubular plate laterally with interfragmentary fixation     with a cortical screw. 2. Interpretation of multiple intraoperative fluoroscopic images.  SURGEON:  Alta Corning, MD.  Terrence DupontModena Slater.  ANESTHESIA:  General.  BRIEF HISTORY:  Ms. Worden is a 71 year old female with a history of having fallen at home.  She was evaluated in the emergency room where she was noted to have a bimalleolar ankle fracture.  She was discharged and ultimately showed up in our office with a displaced bimalleolar ankle fracture.  We checked her skin, which looked reasonable, put a compressive dressing on her and brought her to the operating room at the earliest convenience for open reduction and internal fixation.  The patient was a significant smoker.  We talked about this.  She just refused to address her smoking issues and was satisfied with any potential complications relative to that.  She certainly understood the risks of surgery.  She was brought to the operating room for open reduction and internal fixation.  DESCRIPTION OF PROCEDURE:  The patient was brought to the operative room and after adequate anesthesia was obtained with general anesthetic, the patient was placed supine on the operating table.  The right leg was prepped and draped in usual sterile fashion.  Following  this, the leg was exsanguinated.  Blood pressure tourniquet was inflated to 350 mmHg. Following this, an incision was made over the medial aspect. Subcutaneous tissue was dissected down the level of medial malleolus. Fracture was identified.  All healing elements were cleansed.  The cortical areas were exposed sharply and then anatomic reduction was achieved and 2 guidewires were advanced across this area in parallel for fixation.  50-mm partially threaded screws were then advanced over these guidewires to get compressive anatomic fixation on the medial side. Attention was turned to the lateral side where an incision was made. Subcutaneous tissue was dissected the level of the fractured area. Healing elements were removed.  A lobster jaw clamp was used to get a manipulative closed reduction.  There were some comminuted pieces up laterally, which we pushed back into place manually and held those, and while we held those in place and held the fracture reduced with a lobster jaw clamp, we used a 3.5 drill to get cortical window and then put a top hat in followed by a smaller drill, followed by a cortical screw 18 mm which got anatomic fixation for this lateral malleolus fracture.  Once this was done, a 6-hole 1/3 tubular plate was bent to  put in place, held to the fibula with 3 interrupted screws proximally and then 2 cancellous screws distally.  Anatomic fixation was achieved with the fibula.  Length and rotation had been restored.  Fluoroscopic images were used throughout the case to ensure anatomic and adequate reduction and no hardware complications.  At this point, the wounds were irrigated, suctioned dry, closed in layers.  Sterile compressive dressing was applied as well as the U and a posterior splint.  The patient was taken to the recovery room where she was noted to be in satisfactory condition.  Estimated blood loss for procedure was minimal.     Alta Corning,  M.D.   ______________________________ Alta Corning, M.D.    Corliss Skains  D:  03/20/2017  T:  03/20/2017  Job:  [id number]

## 2017-03-21 NOTE — Op Note (Deleted)
  The note originally documented on this encounter has been moved the the encounter in which it belongs.  

## 2017-03-21 NOTE — Discharge Instructions (Signed)
Elevate your right leg as much as possible on pillows. Moves toes as tolerated. Ambulate NON WEIGHT BEARING ON THE RIGHT with a walker.   Post Anesthesia Home Care Instructions  Activity: Get plenty of rest for the remainder of the day. A responsible individual must stay with you for 24 hours following the procedure.  For the next 24 hours, DO NOT: -Drive a car -Paediatric nurse -Drink alcoholic beverages -Take any medication unless instructed by your physician -Make any legal decisions or sign important papers.  Meals: Start with liquid foods such as gelatin or soup. Progress to regular foods as tolerated. Avoid greasy, spicy, heavy foods. If nausea and/or vomiting occur, drink only clear liquids until the nausea and/or vomiting subsides. Call your physician if vomiting continues.  Special Instructions/Symptoms: Your throat may feel dry or sore from the anesthesia or the breathing tube placed in your throat during surgery. If this causes discomfort, gargle with warm salt water. The discomfort should disappear within 24 hours.  If you had a scopolamine patch placed behind your ear for the management of post- operative nausea and/or vomiting:  1. The medication in the patch is effective for 72 hours, after which it should be removed.  Wrap patch in a tissue and discard in the trash. Wash hands thoroughly with soap and water. 2. You may remove the patch earlier than 72 hours if you experience unpleasant side effects which may include dry mouth, dizziness or visual disturbances. 3. Avoid touching the patch. Wash your hands with soap and water after contact with the patch.   Regional Anesthesia Blocks  1. Numbness or the inability to move the "blocked" extremity may last from 3-48 hours after placement. The length of time depends on the medication injected and your individual response to the medication. If the numbness is not going away after 48 hours, call your surgeon.  2. The  extremity that is blocked will need to be protected until the numbness is gone and the  Strength has returned. Because you cannot feel it, you will need to take extra care to avoid injury. Because it may be weak, you may have difficulty moving it or using it. You may not know what position it is in without looking at it while the block is in effect.  3. For blocks in the legs and feet, returning to weight bearing and walking needs to be done carefully. You will need to wait until the numbness is entirely gone and the strength has returned. You should be able to move your leg and foot normally before you try and bear weight or walk. You will need someone to be with you when you first try to ensure you do not fall and possibly risk injury.  4. Bruising and tenderness at the needle site are common side effects and will resolve in a few days.  5. Persistent numbness or new problems with movement should be communicated to the surgeon or the [place] [phone] [place] [phone]).

## 2017-05-17 ENCOUNTER — Encounter: Payer: Self-pay | Admitting: Physical Therapy

## 2017-05-17 ENCOUNTER — Ambulatory Visit: Payer: Medicare Other | Attending: Orthopedic Surgery | Admitting: Physical Therapy

## 2017-05-17 DIAGNOSIS — M25671 Stiffness of right ankle, not elsewhere classified: Secondary | ICD-10-CM | POA: Insufficient documentation

## 2017-05-17 DIAGNOSIS — M25571 Pain in right ankle and joints of right foot: Secondary | ICD-10-CM | POA: Diagnosis present

## 2017-05-17 DIAGNOSIS — R600 Localized edema: Secondary | ICD-10-CM | POA: Diagnosis present

## 2017-05-17 DIAGNOSIS — R262 Difficulty in walking, not elsewhere classified: Secondary | ICD-10-CM

## 2017-05-17 NOTE — Therapy (Signed)
[place], [place], [address] Phone: [phone]   Fax:  [phone]  Physical Therapy Evaluation  Patient Details  Name: [name] MRN: [id number] Date of Birth: 1945-11-12 Referring Provider: Berenice Primas   Encounter Date: 05/17/2017  PT End of Session - 05/17/17 1053    Visit Number  1    Date for PT Re-Evaluation  07/17/17    PT Start Time  1751    PT Stop Time  1100    PT Time Calculation (min)  45 min    Activity Tolerance  Patient limited by pain    Behavior During Therapy  Anxious       Past Medical History:  Diagnosis Date  . Allergy   . Ankle fracture    right  . Chronic hepatitis C ([place])   . Colon polyp   . COPD (chronic obstructive pulmonary disease) ([place])   . Diverticulitis   . Emphysema of lung ([place])   . Gallstones   . GERD (gastroesophageal reflux disease)   . Hypertension   . Kidney stones   . Osteoarthritis   . Spondylosis of cervical region without myelopathy or radiculopathy     Past Surgical History:  Procedure Laterality Date  . ABDOMINAL HYSTERECTOMY  1980  . BREAST BIOPSY  02/07/2010  . BREAST SURGERY  2011   biopsy  . CHOLECYSTECTOMY  1999  . OPEN REDUCTION INTERNAL FIXATION (ORIF) ANKLE FRACTURE Right 03/20/2017   Performed by Dorna Leitz, MD at [facility]  . TONSILLECTOMY AND ADENOIDECTOMY  1950's    There were no vitals filed for this visit.   Subjective Assessment - 05/17/17 1017    Subjective  Patient fell down stairs when it collapsed.  She sustained a bimalleolar fracture on the right.  She underwent ORIF on 03/20/17.  She was in a cast and non weightbearing, she used a knee walker, She is now WBAT in an ASO.  She reports increase LBP due to the way she is walking and had to have an injection in th elow back    Limitations  Walking;House hold activities    Patient Stated Goals  walk better, have no pain    Currently in Pain?  Yes     Pain Score  2     Pain Location  Ankle    Pain Orientation  Right    Pain Descriptors / Indicators  Aching;Tightness;Sore    Pain Type  Acute pain    Pain Onset  More than a month ago    Pain Frequency  Constant    Aggravating Factors   walking, movements reports pain up to 7/10    Pain Relieving Factors  taked Dilautid, tramadol, rest can have no pain    Effect of Pain on Daily Activities  difficulty with all ADL's and walking         [facility] PT Assessment - 05/17/17 0001      Assessment   Medical Diagnosis  S/P ORIF of the right ankle    Referring Provider  Graves    Onset Date/Surgical Date  03/20/17    Prior Therapy  no      Precautions   Precautions  None      Balance Screen   Has the patient fallen in the past 6 months  Yes    How many times?  1    Has the patient had a decrease in activity level because of a  fear of falling?   No    Is the patient reluctant to leave their home because of a fear of falling?   No      Home Environment   Additional Comments  has a ramp into the home, Has to go up and down stairs to get to car.  Does her own housework      Prior Function   Level of Independence  Independent    Vocation  Retired    Leisure  liked to walk      Observation/Other Assessments-Edema    Edema  Circumferential      Circumferential Edema   Circumferential - Right  26cm mid malleoli    Circumferential - Left   23cm      ROM / Strength   AROM / PROM / Strength  AROM;PROM;Strength      AROM   AROM Assessment Site  Ankle    Right/Left Ankle  Right    Right Ankle Dorsiflexion  -12 12 degrees from neutral    Right Ankle Plantar Flexion  20    Right Ankle Inversion  20    Right Ankle Eversion  0      PROM   PROM Assessment Site  Ankle    Right/Left Ankle  Right    Right Ankle Dorsiflexion  -5 5 degrees from neutral    Right Ankle Plantar Flexion  25    Right Ankle Inversion  20    Right Ankle Eversion  5      Strength   Strength Assessment Site   Ankle    Right/Left Ankle  Right    Right Ankle Dorsiflexion  2-/5    Right Ankle Plantar Flexion  3-/5    Right Ankle Inversion  3+/5    Right Ankle Eversion  1/5      Palpation   Palpation comment  she has a lot of edema areound the ankle, skin is sloughing off, she has eschar in about 3 places on the lateral scar      Ambulation/Gait   Gait Comments  not using a device, slow, first few steps she uses walls and furniture to steady herself, she has a very shortened stance phase, poor pattern, with ASO use      Standardized Balance Assessment   Standardized Balance Assessment  Timed Up and Go Test      Timed Up and Go Test   Normal TUG (seconds)  24             Objective measurements completed on examination: See above findings.              PT Education - 05/17/17 1052    Education provided  Yes    Education Details  towel ankle DF stretch and standing ankle DF stretch    Person(s) Educated  Patient    Methods  Explanation;Demonstration;Verbal cues;Handout;Tactile cues    Comprehension  Returned demonstration;Verbalized understanding;Verbal cues required       PT Short Term Goals - 05/17/17 1059      PT SHORT TERM GOAL #1   Title  independent with initial HEP    Time  2    Period  Weeks    Status  New        PT Long Term Goals - 05/17/17 1059      PT LONG TERM GOAL #1   Title  understand and perform RICE    Time  8    Period  Weeks  Status  New      PT LONG TERM GOAL #2   Title  decrease pain 50%    Time  8    Period  Weeks    Status  New      PT LONG TERM GOAL #3   Title  increase AROM DF to 5 degrees    Time  8    Period  Weeks    Status  New      PT LONG TERM GOAL #4   Title  walk 750 feet with minimal deviation    Time  8    Period  Weeks    Status  New      PT LONG TERM GOAL #5   Title  increase strength to 4/5    Time  8    Period  Weeks    Status  New             Plan - 05/17/17 1055    Clinical Impression  Statement  Patient sustained a bimalleolar fracture when her stairs collapsed, she underwent ORIF with plate and screws on 6/64/40.  She was in a cast, NWB, then a boot, she is now in an ASO, she is not using a device, she has a very poor gait pattern with very short stance phase and no toe off.  Her AROM was 12 degrees from neutral.  She has 3 cm swelling compared to the left    Clinical Presentation  Evolving    Clinical Presentation due to:  recent surgery    Clinical Decision Making  Low    Rehab Potential  Good    PT Frequency  3x / week    PT Duration  8 weeks    PT Treatment/Interventions  ADLs/Self Care Home Management;Cryotherapy;Electrical Stimulation;Gait training;Balance training;Therapeutic exercise;Therapeutic activities;Functional mobility training;Stair training;Patient/family education;Manual techniques;Vasopneumatic Device    PT Next Visit Plan  add exercises, PROM to gait DF for better gait, can use vaso for swelling    Consulted and Agree with Plan of Care  Patient       Patient will benefit from skilled therapeutic intervention in order to improve the following deficits and impairments:  Abnormal gait, Decreased activity tolerance, Decreased balance, Decreased mobility, Decreased strength, Increased edema, Impaired flexibility, Pain, Decreased range of motion, Difficulty walking  Visit Diagnosis: Pain in right ankle and joints of right foot - Plan: PT plan of care cert/re-cert  Stiffness of right ankle, not elsewhere classified - Plan: PT plan of care cert/re-cert  Difficulty in walking, not elsewhere classified - Plan: PT plan of care cert/re-cert  Localized edema - Plan: PT plan of care cert/re-cert  G-Codes - 34/74/25 1103    Functional Assessment Tool Used (Outpatient Only)  foto 72% limitation    Functional Limitation  Mobility: Walking and moving around    Mobility: Walking and Moving Around Current Status (Z5638)  At least 60 percent but less than 80 percent  impaired, limited or restricted    Mobility: Walking and Moving Around Goal Status [phone])  At least 40 percent but less than 60 percent impaired, limited or restricted        Problem List Patient Active Problem List   Diagnosis Date Noted  . Bimalleolar ankle fracture, right, closed, initial encounter 03/20/2017  . Bimalleolar fracture of right ankle 03/20/2017  . Depression with anxiety 12/25/2011  . HTN (hypertension) 12/25/2011  . GERD (gastroesophageal reflux disease) 12/25/2011  . COPD, moderate ([place]) 12/25/2011  . Chronic constipation 12/25/2011  Sumner Boast., PT 05/17/2017, 11:05 AM  [place] [address] [place], [place], [address] Phone: [phone]   Fax:  [phone]  Name: [name] MRN: [id number] Date of Birth: 1946/05/30

## 2017-05-21 ENCOUNTER — Encounter: Payer: Self-pay | Admitting: Physical Therapy

## 2017-05-21 ENCOUNTER — Ambulatory Visit: Payer: Medicare Other | Admitting: Physical Therapy

## 2017-05-21 DIAGNOSIS — R600 Localized edema: Secondary | ICD-10-CM

## 2017-05-21 DIAGNOSIS — M25671 Stiffness of right ankle, not elsewhere classified: Secondary | ICD-10-CM

## 2017-05-21 DIAGNOSIS — M25571 Pain in right ankle and joints of right foot: Secondary | ICD-10-CM | POA: Diagnosis not present

## 2017-05-21 DIAGNOSIS — R262 Difficulty in walking, not elsewhere classified: Secondary | ICD-10-CM

## 2017-05-21 NOTE — Therapy (Signed)
[place] [address] [place], [place], [address] Phone: [phone]   Fax:  [phone]  Physical Therapy Treatment  Patient Details  Name: [name] MRN: [id number] Date of Birth: 06/02/1946 Referring Provider: Berenice Primas   Encounter Date: 05/21/2017  PT End of Session - 05/21/17 1230    Visit Number  2    Date for PT Re-Evaluation  07/17/17    PT Start Time  6256    PT Stop Time  3893    PT Time Calculation (min)  46 min       Past Medical History:  Diagnosis Date  . Allergy   . Ankle fracture    right  . Chronic hepatitis C ([place])   . Colon polyp   . COPD (chronic obstructive pulmonary disease) ([place])   . Diverticulitis   . Emphysema of lung ([place])   . Gallstones   . GERD (gastroesophageal reflux disease)   . Hypertension   . Kidney stones   . Osteoarthritis   . Spondylosis of cervical region without myelopathy or radiculopathy     Past Surgical History:  Procedure Laterality Date  . ABDOMINAL HYSTERECTOMY  1980  . BREAST BIOPSY  02/07/2010  . BREAST SURGERY  2011   biopsy  . CHOLECYSTECTOMY  1999  . ORIF ANKLE FRACTURE Right 03/20/2017   Procedure: OPEN REDUCTION INTERNAL FIXATION (ORIF) ANKLE FRACTURE;  Surgeon: Dorna Leitz, MD;  Location: [facility];  Service: Orthopedics;  Laterality: Right;  . TONSILLECTOMY AND ADENOIDECTOMY  1950's    There were no vitals filed for this visit.  Subjective Assessment - 05/21/17 1151    Subjective  Pt reports that she is doing pretty good    Currently in Pain?  Yes    Pain Score  0-No pain         [facility] PT Assessment - 05/21/17 0001      AROM   Right Ankle Dorsiflexion  0                  [facility] Adult PT Treatment/Exercise - 05/21/17 0001      Exercises   Exercises  Ankle      Modalities   Modalities  Cryotherapy      Cryotherapy   Number Minutes Cryotherapy  10 Minutes    Cryotherapy Location  Ankle    Type of  Cryotherapy  Ice pack      Manual Therapy   Manual Therapy  Passive ROM    Passive ROM  R ankle all motions       Ankle Exercises: Aerobic   Elliptical  NuStep L4 x6 min      Ankle Exercises: Standing   Other Standing Ankle Exercises  sit to stand 2x10    Other Standing Ankle Exercises  wt shifts UE on therapist shoulder 2x10       Ankle Exercises: Seated   Ankle Circles/Pumps  15 reps    Other Seated Ankle Exercises  All ankle motions on dyna disk x15     Other Seated Ankle Exercises  Ankle 4 way yellow tband  x10                PT Short Term Goals - 05/21/17 1224      PT SHORT TERM GOAL #1   Title  independent with initial HEP    Status  Achieved        PT Long Term Goals - 05/21/17 1225  PT LONG TERM GOAL #1   Title  understand and perform RICE    Status  Partially Met      PT LONG TERM GOAL #2   Title  decrease pain 50%      PT LONG TERM GOAL #4   Title  walk 750 feet with minimal deviation    Status  On-going            Plan - 05/21/17 1226    Clinical Impression Statement  Pt ~ 5 minutes late for today's treatment. Pt very fearful of initially with PROM, but able to relax after a few movements. Pt has progressed increasing her R ankle dorsiflexion to neutral. R ankle is very limited with passive eversion. She does report a stretching sensation on the lateral A with circle motions at the ankle. Sit to stand and wt shifts without ASO, no reports of increase pain.    Rehab Potential  Good    PT Frequency  3x / week    PT Duration  8 weeks    PT Next Visit Plan  add exercises, PROM to gait DF for better gait, can use vaso for swelling       Patient will benefit from skilled therapeutic intervention in order to improve the following deficits and impairments:  Abnormal gait, Decreased activity tolerance, Decreased balance, Decreased mobility, Decreased strength, Increased edema, Impaired flexibility, Pain, Decreased range of motion, Difficulty  walking  Visit Diagnosis: Pain in right ankle and joints of right foot  Stiffness of right ankle, not elsewhere classified  Difficulty in walking, not elsewhere classified  Localized edema     Problem List Patient Active Problem List   Diagnosis Date Noted  . Bimalleolar ankle fracture, right, closed, initial encounter 03/20/2017  . Bimalleolar fracture of right ankle 03/20/2017  . Depression with anxiety 12/25/2011  . HTN (hypertension) 12/25/2011  . GERD (gastroesophageal reflux disease) 12/25/2011  . COPD, moderate ([place]) 12/25/2011  . Chronic constipation 12/25/2011    Scot Jun, PTA 05/21/2017, 12:31 PM  [place], [place], [address] Phone: [phone]   Fax:  [phone]  Name: [name] MRN: [id number] Date of Birth: 03-24-1946

## 2017-05-28 ENCOUNTER — Encounter: Payer: Self-pay | Admitting: Physical Therapy

## 2017-05-28 ENCOUNTER — Ambulatory Visit: Payer: Medicare Other | Admitting: Physical Therapy

## 2017-05-28 DIAGNOSIS — M25571 Pain in right ankle and joints of right foot: Secondary | ICD-10-CM

## 2017-05-28 DIAGNOSIS — M25671 Stiffness of right ankle, not elsewhere classified: Secondary | ICD-10-CM

## 2017-05-28 DIAGNOSIS — R600 Localized edema: Secondary | ICD-10-CM

## 2017-05-28 DIAGNOSIS — R262 Difficulty in walking, not elsewhere classified: Secondary | ICD-10-CM

## 2017-05-28 NOTE — Therapy (Signed)
[place], [place], [address] Phone: [phone]   Fax:  [phone]  Physical Therapy Treatment  Patient Details  Name: [name] MRN: [id number] Date of Birth: [birth year] Referring Provider: Berenice Primas   Encounter Date: 05/28/2017  PT End of Session - 05/28/17 1101    Visit Number  3    Date for PT Re-Evaluation  07/17/17    PT Start Time  2202    PT Stop Time  1110    PT Time Calculation (min)  55 min    Activity Tolerance  Patient limited by pain    Behavior During Therapy  Anxious       Past Medical History:  Diagnosis Date  . Allergy   . Ankle fracture    right  . Chronic hepatitis C ([place])   . Colon polyp   . COPD (chronic obstructive pulmonary disease) ([place])   . Diverticulitis   . Emphysema of lung ([place])   . Gallstones   . GERD (gastroesophageal reflux disease)   . Hypertension   . Kidney stones   . Osteoarthritis   . Spondylosis of cervical region without myelopathy or radiculopathy     Past Surgical History:  Procedure Laterality Date  . ABDOMINAL HYSTERECTOMY  1980  . BREAST BIOPSY  02/07/2010  . BREAST SURGERY  2011   biopsy  . CHOLECYSTECTOMY  1999  . ORIF ANKLE FRACTURE Right 03/20/2017   Procedure: OPEN REDUCTION INTERNAL FIXATION (ORIF) ANKLE FRACTURE;  Surgeon: Dorna Leitz, MD;  Location: [facility];  Service: Orthopedics;  Laterality: Right;  . TONSILLECTOMY AND ADENOIDECTOMY  1950's    There were no vitals filed for this visit.  Subjective Assessment - 05/28/17 1015    Subjective  "Fine"    Currently in Pain?  No/denies    Pain Score  0-No pain                      [facility] Adult PT Treatment/Exercise - 05/28/17 0001      Ambulation/Gait   Gait Comments  antalgic gait, decrease stance time on RLE, decrease heel on and to off RLE      High Level Balance   High Level Balance Activities  Backward walking      Exercises   Exercises  Ankle      Modalities   Modalities  Cryotherapy      Cryotherapy   Number Minutes Cryotherapy  10 Minutes    Cryotherapy Location  Ankle    Type of Cryotherapy  Ice pack      Manual Therapy   Manual Therapy  Passive ROM    Passive ROM  R ankle all motions       Ankle Exercises: Aerobic   Stationary Bike  L1 x3     Elliptical  NuStep L4 x6 min      Ankle Exercises: Standing   Other Standing Ankle Exercises  sit to stand 2x10; R ankle mobility with dyna disk       Ankle Exercises: Seated   Ankle Circles/Pumps  15 reps    Other Seated Ankle Exercises  Ankle PF/DF yellow tband  x15               PT Short Term Goals - 05/21/17 1224      PT SHORT TERM GOAL #1   Title  independent with initial HEP    Status  Achieved  PT Long Term Goals - 05/21/17 1225      PT LONG TERM GOAL #1   Title  understand and perform RICE    Status  Partially Met      PT LONG TERM GOAL #2   Title  decrease pain 50%      PT LONG TERM GOAL #4   Title  walk 750 feet with minimal deviation    Status  On-going            Plan - 05/28/17 1102    Clinical Impression Statement  Pt  fearful with ankle motions at times, pt requires cues to relax during MT. All standing interventions without brace. Pt nervous to ambulate without brace. Pt reports that she still uses her walking boot when she gets up at night to use the rest room. instructed pt to ambulate around house without brace, but use brace for outdoor ambulation. Pt has some difficulty performing R ankle inversion and eversion, she wants to mover her entire shaft    Rehab Potential  Good    PT Frequency  3x / week    PT Duration  8 weeks    PT Treatment/Interventions  ADLs/Self Care Home Management;Cryotherapy;Electrical Stimulation;Gait training;Balance training;Therapeutic exercise;Therapeutic activities;Functional mobility training;Stair training;Patient/family education;Manual techniques;Vasopneumatic Device     PT Next Visit Plan  add exercises, PROM to gait DF for better gait, can use vaso for swelling       Patient will benefit from skilled therapeutic intervention in order to improve the following deficits and impairments:  Abnormal gait, Decreased activity tolerance, Decreased balance, Decreased mobility, Decreased strength, Increased edema, Impaired flexibility, Pain, Decreased range of motion, Difficulty walking  Visit Diagnosis: Stiffness of right ankle, not elsewhere classified  Pain in right ankle and joints of right foot  Localized edema  Difficulty in walking, not elsewhere classified     Problem List Patient Active Problem List   Diagnosis Date Noted  . Bimalleolar ankle fracture, right, closed, initial encounter 03/20/2017  . Bimalleolar fracture of right ankle 03/20/2017  . Depression with anxiety 12/25/2011  . HTN (hypertension) 12/25/2011  . GERD (gastroesophageal reflux disease) 12/25/2011  . COPD, moderate ([place]) 12/25/2011  . Chronic constipation 12/25/2011    Scot Jun, PTA 05/28/2017, 11:06 AM  [place] [address] [place], [place], [address] Phone: [phone]   Fax:  [phone]  Name: [name] MRN: [id number] Date of Birth: [birth year]

## 2017-05-30 ENCOUNTER — Ambulatory Visit: Payer: Medicare Other | Admitting: Physical Therapy

## 2017-05-30 ENCOUNTER — Encounter: Payer: Self-pay | Admitting: Physical Therapy

## 2017-05-30 DIAGNOSIS — M25671 Stiffness of right ankle, not elsewhere classified: Secondary | ICD-10-CM

## 2017-05-30 DIAGNOSIS — M25571 Pain in right ankle and joints of right foot: Secondary | ICD-10-CM

## 2017-05-30 DIAGNOSIS — R600 Localized edema: Secondary | ICD-10-CM

## 2017-05-30 DIAGNOSIS — R262 Difficulty in walking, not elsewhere classified: Secondary | ICD-10-CM

## 2017-05-30 NOTE — Therapy (Signed)
[place], [place], [address] Phone: [phone]   Fax:  [phone]  Physical Therapy Treatment  Patient Details  Name: [name] MRN: [id number] Date of Birth: 1945-12-22 Referring Provider: Berenice Primas   Encounter Date: 05/30/2017  PT End of Session - 05/30/17 1054    Visit Number  4    Date for PT Re-Evaluation  07/17/17    PT Start Time  4315    PT Stop Time  1055    PT Time Calculation (min)  40 min    Activity Tolerance  Patient tolerated treatment well    Behavior During Therapy  Anxious       Past Medical History:  Diagnosis Date  . Allergy   . Ankle fracture    right  . Chronic hepatitis C ([place])   . Colon polyp   . COPD (chronic obstructive pulmonary disease) ([place])   . Diverticulitis   . Emphysema of lung ([place])   . Gallstones   . GERD (gastroesophageal reflux disease)   . Hypertension   . Kidney stones   . Osteoarthritis   . Spondylosis of cervical region without myelopathy or radiculopathy     Past Surgical History:  Procedure Laterality Date  . ABDOMINAL HYSTERECTOMY  1980  . BREAST BIOPSY  02/07/2010  . BREAST SURGERY  2011   biopsy  . CHOLECYSTECTOMY  1999  . ORIF ANKLE FRACTURE Right 03/20/2017   Procedure: OPEN REDUCTION INTERNAL FIXATION (ORIF) ANKLE FRACTURE;  Surgeon: Dorna Leitz, MD;  Location: [facility];  Service: Orthopedics;  Laterality: Right;  . TONSILLECTOMY AND ADENOIDECTOMY  1950's    There were no vitals filed for this visit.  Subjective Assessment - 05/30/17 1016    Subjective  "Fine"    Currently in Pain?  No/denies    Pain Score  0-No pain         [facility] PT Assessment - 05/30/17 0001      Circumferential Edema   Circumferential - Right  25.5cm mid malleoli      AROM   Right Ankle Dorsiflexion  0      PROM   Right Ankle Dorsiflexion  5                  [facility] Adult PT Treatment/Exercise - 05/30/17 0001       Ambulation/Gait   Gait Comments  antalgic gait, decrease stance tinme on RLE, decrease heel on and to off RLE      Manual Therapy   Manual Therapy  Passive ROM    Passive ROM  R ankle all motions       Ankle Exercises: Aerobic   Stationary Bike  L1 x6     Elliptical  NuStep L3 x3 min       Ankle Exercises: Machines for Strengthening   Cybex Leg Press  20lb 2x10      Ankle Exercises: Stretches   Gastroc Stretch  4 reps;20 seconds      Ankle Exercises: Seated   Ankle Circles/Pumps  Right;AROM;10 reps    Other Seated Ankle Exercises  Ankle PF/DF yellow tband  x15      Ankle Exercises: Standing   Other Standing Ankle Exercises  march 2x10                PT Short Term Goals - 05/21/17 1224      PT SHORT TERM GOAL #1   Title  independent with initial  HEP    Status  Achieved        PT Long Term Goals - 05/21/17 1225      PT LONG TERM GOAL #1   Title  understand and perform RICE    Status  Partially Met      PT LONG TERM GOAL #2   Title  decrease pain 50%      PT LONG TERM GOAL #4   Title  walk 750 feet with minimal deviation    Status  On-going            Plan - 05/30/17 1054    Clinical Impression Statement  Treatment session completed without brace, pt remain somewhat fearful and guarded during MT. Pt able to make circles with R ankle but unable to do pure inversion ans eversion. All other interventions compleated well. Pt denied modality post treatment    Rehab Potential  Good    PT Frequency  3x / week    PT Treatment/Interventions  ADLs/Self Care Home Management;Cryotherapy;Electrical Stimulation;Gait training;Balance training;Therapeutic exercise;Therapeutic activities;Functional mobility training;Stair training;Patient/family education;Manual techniques;Vasopneumatic Device    PT Next Visit Plan  add exercises, PROM to gait DF for better gait, can use vaso for swelling       Patient will benefit from skilled therapeutic intervention in  order to improve the following deficits and impairments:  Abnormal gait, Decreased activity tolerance, Decreased balance, Decreased mobility, Decreased strength, Increased edema, Impaired flexibility, Pain, Decreased range of motion, Difficulty walking  Visit Diagnosis: Stiffness of right ankle, not elsewhere classified  Localized edema  Pain in right ankle and joints of right foot  Difficulty in walking, not elsewhere classified     Problem List Patient Active Problem List   Diagnosis Date Noted  . Bimalleolar ankle fracture, right, closed, initial encounter 03/20/2017  . Bimalleolar fracture of right ankle 03/20/2017  . Depression with anxiety 12/25/2011  . HTN (hypertension) 12/25/2011  . GERD (gastroesophageal reflux disease) 12/25/2011  . COPD, moderate ([place]) 12/25/2011  . Chronic constipation 12/25/2011    Scot Jun, PTA 05/30/2017, 10:58 AM  [place], [place], [address] Phone: [phone]   Fax:  [phone]  Name: [name] MRN: [id number] Date of Birth: 04-06-1946

## 2017-06-04 ENCOUNTER — Ambulatory Visit: Payer: Medicare Other | Attending: Orthopedic Surgery | Admitting: Physical Therapy

## 2017-06-04 ENCOUNTER — Encounter: Payer: Self-pay | Admitting: Physical Therapy

## 2017-06-04 DIAGNOSIS — R600 Localized edema: Secondary | ICD-10-CM | POA: Insufficient documentation

## 2017-06-04 DIAGNOSIS — M25571 Pain in right ankle and joints of right foot: Secondary | ICD-10-CM | POA: Diagnosis not present

## 2017-06-04 DIAGNOSIS — M25671 Stiffness of right ankle, not elsewhere classified: Secondary | ICD-10-CM | POA: Insufficient documentation

## 2017-06-04 DIAGNOSIS — R262 Difficulty in walking, not elsewhere classified: Secondary | ICD-10-CM | POA: Insufficient documentation

## 2017-06-04 NOTE — Therapy (Signed)
[place], [place], [address] Phone: [phone]   Fax:  [phone]  Physical Therapy Treatment  Patient Details  Name: [name] MRN: [id number] Date of Birth: 05-10-1946 Referring Provider: Berenice Primas   Encounter Date: 06/04/2017  PT End of Session - 06/04/17 1428    Visit Number  5    Date for PT Re-Evaluation  07/17/17    PT Start Time  1345    PT Stop Time  1442    PT Time Calculation (min)  57 min    Activity Tolerance  Patient tolerated treatment well    Behavior During Therapy  Anxious       Past Medical History:  Diagnosis Date  . Allergy   . Ankle fracture    right  . Chronic hepatitis C ([place])   . Colon polyp   . COPD (chronic obstructive pulmonary disease) ([place])   . Diverticulitis   . Emphysema of lung ([place])   . Gallstones   . GERD (gastroesophageal reflux disease)   . Hypertension   . Kidney stones   . Osteoarthritis   . Spondylosis of cervical region without myelopathy or radiculopathy     Past Surgical History:  Procedure Laterality Date  . ABDOMINAL HYSTERECTOMY  1980  . BREAST BIOPSY  02/07/2010  . BREAST SURGERY  2011   biopsy  . CHOLECYSTECTOMY  1999  . ORIF ANKLE FRACTURE Right 03/20/2017   Procedure: OPEN REDUCTION INTERNAL FIXATION (ORIF) ANKLE FRACTURE;  Surgeon: Dorna Leitz, MD;  Location: [facility];  Service: Orthopedics;  Laterality: Right;  . TONSILLECTOMY AND ADENOIDECTOMY  1950's    There were no vitals filed for this visit.  Subjective Assessment - 06/04/17 1344    Subjective  Pt reports that things are doing well     Currently in Pain?  Yes on the way into the clinic    Pain Score  7     Pain Location  Ankle    Pain Orientation  Right                      [facility] Adult PT Treatment/Exercise - 06/04/17 0001      Ambulation/Gait   Gait Comments  antalgic gait, decrease stance tinme on RLE, decrease heel on and  to off RLE      Modalities   Modalities  Vasopneumatic      Vasopneumatic   Number Minutes Vasopneumatic   15 minutes    Vasopnuematic Location   Ankle    Vasopneumatic Pressure  Medium    Vasopneumatic Temperature   33      Manual Therapy   Manual Therapy  Passive ROM    Passive ROM  R ankle all motions       Ankle Exercises: Aerobic   Stationary Bike  L1 x6       Ankle Exercises: Seated   Ankle Circles/Pumps  Right;AROM;15 reps    Other Seated Ankle Exercises  Ankle PF/DF red tband  x15      Ankle Exercises: Standing   Other Standing Ankle Exercises  step ups 4 in 2x10, resisted gait 2lb front and back x5 each    Other Standing Ankle Exercises  Step downs 2 in x10, x5                PT Short Term Goals - 05/21/17 1224      PT SHORT TERM GOAL #1  Title  independent with initial HEP    Status  Achieved        PT Long Term Goals - 05/21/17 1225      PT LONG TERM GOAL #1   Title  understand and perform RICE    Status  Partially Met      PT LONG TERM GOAL #2   Title  decrease pain 50%      PT LONG TERM GOAL #4   Title  walk 750 feet with minimal deviation    Status  On-going            Plan - 06/04/17 1429    Clinical Impression Statement  Pt remains guarded with MT. Cues provided during gait trail to promote heel on and toe off with RLE. Pt does report some pain with step downs, tactile cues given keep heel down and to flex R knee.    Rehab Potential  Good    PT Frequency  3x / week    PT Duration  8 weeks    PT Treatment/Interventions  ADLs/Self Care Home Management;Cryotherapy;Electrical Stimulation;Gait training;Balance training;Therapeutic exercise;Therapeutic activities;Functional mobility training;Stair training;Patient/family education;Manual techniques;Vasopneumatic Device    PT Next Visit Plan  add exercises, PROM to gait DF for better gait, can use vaso for swelling       Patient will benefit from skilled therapeutic intervention in  order to improve the following deficits and impairments:  Abnormal gait, Decreased activity tolerance, Decreased balance, Decreased mobility, Decreased strength, Increased edema, Impaired flexibility, Pain, Decreased range of motion, Difficulty walking  Visit Diagnosis: Pain in right ankle and joints of right foot  Localized edema  Stiffness of right ankle, not elsewhere classified  Difficulty in walking, not elsewhere classified     Problem List Patient Active Problem List   Diagnosis Date Noted  . Bimalleolar ankle fracture, right, closed, initial encounter 03/20/2017  . Bimalleolar fracture of right ankle 03/20/2017  . Depression with anxiety 12/25/2011  . HTN (hypertension) 12/25/2011  . GERD (gastroesophageal reflux disease) 12/25/2011  . COPD, moderate ([place]) 12/25/2011  . Chronic constipation 12/25/2011    Scot Jun, PTA 06/04/2017, 2:31 PM  [place], [place], [address] Phone: [phone]   Fax:  [phone]  Name: [name] MRN: [id number] Date of Birth: 1945-08-22

## 2017-06-06 ENCOUNTER — Ambulatory Visit: Payer: Medicare Other | Admitting: Physical Therapy

## 2017-06-06 ENCOUNTER — Encounter: Payer: Self-pay | Admitting: Physical Therapy

## 2017-06-06 DIAGNOSIS — R600 Localized edema: Secondary | ICD-10-CM

## 2017-06-06 DIAGNOSIS — M25571 Pain in right ankle and joints of right foot: Secondary | ICD-10-CM

## 2017-06-06 DIAGNOSIS — M25671 Stiffness of right ankle, not elsewhere classified: Secondary | ICD-10-CM

## 2017-06-06 DIAGNOSIS — R262 Difficulty in walking, not elsewhere classified: Secondary | ICD-10-CM

## 2017-06-06 NOTE — Therapy (Signed)
[place], [place], [address] Phone: [phone]   Fax:  [phone]  Physical Therapy Treatment  Patient Details  Name: [name] MRN: [id number] Date of Birth: 01-22-1946 Referring Provider: Berenice Primas   Encounter Date: 06/06/2017  PT End of Session - 06/06/17 1101    Visit Number  6    Date for PT Re-Evaluation  07/17/17    PT Start Time  5945    PT Stop Time  1114    PT Time Calculation (min)  59 min    Activity Tolerance  Patient tolerated treatment well    Behavior During Therapy  Anxious;WFL for tasks assessed/performed       Past Medical History:  Diagnosis Date  . Allergy   . Ankle fracture    right  . Chronic hepatitis C ([place])   . Colon polyp   . COPD (chronic obstructive pulmonary disease) ([place])   . Diverticulitis   . Emphysema of lung ([place])   . Gallstones   . GERD (gastroesophageal reflux disease)   . Hypertension   . Kidney stones   . Osteoarthritis   . Spondylosis of cervical region without myelopathy or radiculopathy     Past Surgical History:  Procedure Laterality Date  . ABDOMINAL HYSTERECTOMY  1980  . BREAST BIOPSY  02/07/2010  . BREAST SURGERY  2011   biopsy  . CHOLECYSTECTOMY  1999  . ORIF ANKLE FRACTURE Right 03/20/2017   Procedure: OPEN REDUCTION INTERNAL FIXATION (ORIF) ANKLE FRACTURE;  Surgeon: Dorna Leitz, MD;  Location: [facility];  Service: Orthopedics;  Laterality: Right;  . TONSILLECTOMY AND ADENOIDECTOMY  1950's    There were no vitals filed for this visit.  Subjective Assessment - 06/06/17 1019    Subjective  "Im fine"    Currently in Pain?  Yes    Pain Score  3     Pain Location  Ankle    Pain Orientation  Right                      [facility] Adult PT Treatment/Exercise - 06/06/17 0001      Ambulation/Gait   Stairs  Yes    Stairs Assistance  5: Supervision    Stair Management Technique  One rail  Right;Alternating pattern    Number of Stairs  24    Height of Stairs  6    Gait Comments  some R ankle pain due to decrease DF      Modalities   Modalities  Vasopneumatic      Vasopneumatic   Number Minutes Vasopneumatic   15 minutes    Vasopnuematic Location   Ankle    Vasopneumatic Pressure  High    Vasopneumatic Temperature   33      Manual Therapy   Manual Therapy  Passive ROM    Passive ROM  R ankle all motions       Ankle Exercises: Aerobic   Stationary Bike  L1 x4     Elliptical  I5 R3 44fd/2rev      Ankle Exercises: Seated   Ankle Circles/Pumps  Right;AROM;15 reps    Other Seated Ankle Exercises  Ankle PF/DF red tband  x15      Ankle Exercises: Machines for Strengthening   Cybex Leg Press  30lb 2x10               PT Short Term Goals - 05/21/17 1224  PT SHORT TERM GOAL #1   Title  independent with initial HEP    Status  Achieved        PT Long Term Goals - 05/21/17 1225      PT LONG TERM GOAL #1   Title  understand and perform RICE    Status  Partially Met      PT LONG TERM GOAL #2   Title  decrease pain 50%      PT LONG TERM GOAL #4   Title  walk 750 feet with minimal deviation    Status  On-going            Plan - 06/06/17 1104    Clinical Impression Statement  Guarded with MT requiring multiple cues to relax. Pt able to fully extend her RLE while on leg press. Pain when descending stairs due to decrease R ankle dorsi flexion.    Rehab Potential  Good    PT Frequency  3x / week    PT Duration  8 weeks    PT Treatment/Interventions  ADLs/Self Care Home Management;Cryotherapy;Electrical Stimulation;Gait training;Balance training;Therapeutic exercise;Therapeutic activities;Functional mobility training;Stair training;Patient/family education;Manual techniques;Vasopneumatic Device    PT Next Visit Plan  add exercises, PROM to gait DF for better gait, can use vaso for swelling       Patient will benefit from skilled therapeutic  intervention in order to improve the following deficits and impairments:  Abnormal gait, Decreased activity tolerance, Decreased balance, Decreased mobility, Decreased strength, Increased edema, Impaired flexibility, Pain, Decreased range of motion, Difficulty walking  Visit Diagnosis: Localized edema  Pain in right ankle and joints of right foot  Stiffness of right ankle, not elsewhere classified  Difficulty in walking, not elsewhere classified     Problem List Patient Active Problem List   Diagnosis Date Noted  . Bimalleolar ankle fracture, right, closed, initial encounter 03/20/2017  . Bimalleolar fracture of right ankle 03/20/2017  . Depression with anxiety 12/25/2011  . HTN (hypertension) 12/25/2011  . GERD (gastroesophageal reflux disease) 12/25/2011  . COPD, moderate ([place]) 12/25/2011  . Chronic constipation 12/25/2011    Scot Jun, PTA 06/06/2017, 11:10 AM  [place], [place], [address] Phone: [phone]   Fax:  [phone]  Name: [name] MRN: [id number] Date of Birth: 06-13-1946

## 2017-06-12 ENCOUNTER — Encounter: Payer: Self-pay | Admitting: Physical Therapy

## 2017-06-12 ENCOUNTER — Ambulatory Visit: Payer: Medicare Other | Admitting: Physical Therapy

## 2017-06-12 DIAGNOSIS — M25671 Stiffness of right ankle, not elsewhere classified: Secondary | ICD-10-CM

## 2017-06-12 DIAGNOSIS — R262 Difficulty in walking, not elsewhere classified: Secondary | ICD-10-CM

## 2017-06-12 DIAGNOSIS — M25571 Pain in right ankle and joints of right foot: Secondary | ICD-10-CM | POA: Diagnosis not present

## 2017-06-12 DIAGNOSIS — R600 Localized edema: Secondary | ICD-10-CM

## 2017-06-12 NOTE — Therapy (Signed)
[name], [place], [address] Phone: [phone]   Fax:  [phone]  Physical Therapy Treatment  Patient Details  Name: [name] MRM: [id number] Date of Birth: 22-Apr-1946 Referring Provider: Berenice Primas   Encounter Date: 06/12/2017  PT End of Session - 06/12/17 1426    Visit Number  7    Date for PT Re-Evaluation  07/17/17    PT Start Time  1345    PT Stop Time  1435    PT Time Calculation (min)  50 min    Activity Tolerance  Patient tolerated treatment well    Behavior During Therapy  Anxious;WFL for tasks assessed/performed       Past Medical History:  Diagnosis Date  . Allergy   . Ankle fracture    right  . Chronic hepatitis C ([place])   . Colon polyp   . COPD (chronic obstructive pulmonary disease) ([place])   . Diverticulitis   . Emphysema of lung ([place])   . Gallstones   . GERD (gastroesophageal reflux disease)   . Hypertension   . Kidney stones   . Osteoarthritis   . Spondylosis of cervical region without myelopathy or radiculopathy     Past Surgical History:  Procedure Laterality Date  . ABDOMINAL HYSTERECTOMY  1980  . BREAST BIOPSY  02/07/2010  . BREAST SURGERY  2011   biopsy  . CHOLECYSTECTOMY  1999  . ORIF ANKLE FRACTURE Right 03/20/2017   Procedure: OPEN REDUCTION INTERNAL FIXATION (ORIF) ANKLE FRACTURE;  Surgeon: Dorna Leitz, MD;  Location: [facility];  Service: Orthopedics;  Laterality: Right;  . TONSILLECTOMY AND ADENOIDECTOMY  1950's    There were no vitals filed for this visit.  Subjective Assessment - 06/12/17 1350    Subjective  "I am doing all right"    Currently in Pain?  No/denies    Pain Score  0-No pain         [facility] PT Assessment - 06/12/17 0001      AROM   Right/Left Ankle  Right    Right Ankle Dorsiflexion  1    Right Ankle Plantar Flexion  53    Right Ankle Inversion  35    Right Ankle Eversion  20                   [facility] Adult PT Treatment/Exercise - 06/12/17 0001      Cryotherapy   Number Minutes Cryotherapy  10 Minutes    Cryotherapy Location  Ankle    Type of Cryotherapy  Ice pack      Manual Therapy   Manual Therapy  Passive ROM    Passive ROM  R ankle all motions       Ankle Exercises: Aerobic   Stationary Bike  L2 x4     Elliptical  I5 R3 35fd/2rev      Ankle Exercises: Seated   Ankle Circles/Pumps  Right;AROM;20 reps      Ankle Exercises: Machines for Strengthening   Cybex Leg Press  30lb 2x15      Ankle Exercises: Stretches   Gastroc Stretch  4 reps;20 seconds               PT Short Term Goals - 05/21/17 1224      PT SHORT TERM GOAL #1   Title  independent with initial HEP    Status  Achieved        PT Long Term Goals -  05/21/17 1225      PT LONG TERM GOAL #1   Title  understand and perform RICE    Status  Partially Met      PT LONG TERM GOAL #2   Title  decrease pain 50%      PT LONG TERM GOAL #4   Title  walk 750 feet with minimal deviation    Status  On-going            Plan - 06/12/17 1427    Clinical Impression Statement  Pt R ankle AROM has improved but she still lake dorsi flexion. Some pain and difficulty with step downs. Some guarding with MT. Instructed pt to do more DF stretching at home.    Rehab Potential  Good    PT Frequency  3x / week    PT Duration  8 weeks    PT Treatment/Interventions  ADLs/Self Care Home Management;Cryotherapy;Electrical Stimulation;Gait training;Balance training;Therapeutic exercise;Therapeutic activities;Functional mobility training;Stair training;Patient/family education;Manual techniques;Vasopneumatic Device    PT Next Visit Plan  add exercises, PROM to gait DF for better gait, can use vaso for swelling       Patient will benefit from skilled therapeutic intervention in order to improve the following deficits and impairments:  Abnormal gait, Decreased activity tolerance, Decreased  balance, Decreased mobility, Decreased strength, Increased edema, Impaired flexibility, Pain, Decreased range of motion, Difficulty walking  Visit Diagnosis: Localized edema  Pain in right ankle and joints of right foot  Difficulty in walking, not elsewhere classified  Stiffness of right ankle, not elsewhere classified     Problem List Patient Active Problem List   Diagnosis Date Noted  . Bimalleolar ankle fracture, right, closed, initial encounter 03/20/2017  . Bimalleolar fracture of right ankle 03/20/2017  . Depression with anxiety 12/25/2011  . HTN (hypertension) 12/25/2011  . GERD (gastroesophageal reflux disease) 12/25/2011  . COPD, moderate ([place]) 12/25/2011  . Chronic constipation 12/25/2011    Scot Jun, PTA 06/12/2017, 2:28 PM  [place], [place], [address] Phone: [phone]   Fax:  [phone]  Name: [name] MRN: [id number] Date of Birth: Sep 07, 1945

## 2017-06-14 ENCOUNTER — Ambulatory Visit: Payer: Medicare Other | Admitting: Physical Therapy

## 2017-06-14 ENCOUNTER — Encounter: Payer: Self-pay | Admitting: Physical Therapy

## 2017-06-14 DIAGNOSIS — M25571 Pain in right ankle and joints of right foot: Secondary | ICD-10-CM | POA: Diagnosis not present

## 2017-06-14 DIAGNOSIS — M25671 Stiffness of right ankle, not elsewhere classified: Secondary | ICD-10-CM

## 2017-06-14 DIAGNOSIS — R600 Localized edema: Secondary | ICD-10-CM

## 2017-06-14 DIAGNOSIS — R262 Difficulty in walking, not elsewhere classified: Secondary | ICD-10-CM

## 2017-06-14 NOTE — Therapy (Signed)
[place], [place], [address] Phone: [phone]   Fax:  [phone]  Physical Therapy Treatment  Patient Details  Name: [name] MRN: [id number] Date of Birth: [birth year] Referring Provider: Berenice Primas   Encounter Date: 06/14/2017  PT End of Session - 06/14/17 0839    Visit Number  8    Date for PT Re-Evaluation  07/17/17    PT Start Time  0800    PT Stop Time  0855    PT Time Calculation (min)  55 min    Activity Tolerance  Patient tolerated treatment well    Behavior During Therapy  Anxious;WFL for tasks assessed/performed       Past Medical History:  Diagnosis Date  . Allergy   . Ankle fracture    right  . Chronic hepatitis C ([place])   . Colon polyp   . COPD (chronic obstructive pulmonary disease) ([place])   . Diverticulitis   . Emphysema of lung ([place])   . Gallstones   . GERD (gastroesophageal reflux disease)   . Hypertension   . Kidney stones   . Osteoarthritis   . Spondylosis of cervical region without myelopathy or radiculopathy     Past Surgical History:  Procedure Laterality Date  . ABDOMINAL HYSTERECTOMY  1980  . BREAST BIOPSY  02/07/2010  . BREAST SURGERY  2011   biopsy  . CHOLECYSTECTOMY  1999  . ORIF ANKLE FRACTURE Right 03/20/2017   Procedure: OPEN REDUCTION INTERNAL FIXATION (ORIF) ANKLE FRACTURE;  Surgeon: Dorna Leitz, MD;  Location: [facility];  Service: Orthopedics;  Laterality: Right;  . TONSILLECTOMY AND ADENOIDECTOMY  1950's    There were no vitals filed for this visit.  Subjective Assessment - 06/14/17 0804    Subjective  Pt reports that things are going well, she was sore after last session    Currently in Pain?  No/denies    Pain Score  0-No pain                      [facility] Adult PT Treatment/Exercise - 06/14/17 0001      Vasopneumatic   Number Minutes Vasopneumatic   15 minutes    Vasopnuematic Location   Ankle    Vasopneumatic Pressure  High    Vasopneumatic Temperature   33      Ankle Exercises: Aerobic   Stationary Bike  L1 x5    Elliptical  I7 R3 53fd/2rev      Ankle Exercises: Standing   Rocker Board  2 minutes    Toe Raise  15 reps;2 seconds    Other Standing Ankle Exercises  Step downs 2 in x10, x5       Ankle Exercises: Stretches   Gastroc Stretch  4 reps;20 seconds      Ankle Exercises: Machines for Strengthening   Cybex Leg Press  30lb 2x15               PT Short Term Goals - 05/21/17 1224      PT SHORT TERM GOAL #1   Title  independent with initial HEP    Status  Achieved        PT Long Term Goals - 05/21/17 1225      PT LONG TERM GOAL #1   Title  understand and perform RICE    Status  Partially Met      PT LONG TERM GOAL #2   Title  decrease  pain 50%      PT LONG TERM GOAL #4   Title  walk 750 feet with minimal deviation    Status  On-going            Plan - 06/14/17 0841    Clinical Impression Statement  Pt with some soreness during gastroc stretch and step downs. Tactile cues given during step downs to prevent  right heel from lifting.     Rehab Potential  Good    PT Frequency  3x / week    PT Duration  8 weeks    PT Treatment/Interventions  ADLs/Self Care Home Management;Cryotherapy;Electrical Stimulation;Gait training;Balance training;Therapeutic exercise;Therapeutic activities;Functional mobility training;Stair training;Patient/family education;Manual techniques;Vasopneumatic Device    PT Next Visit Plan  add exercises, PROM to gait DF for better gait, can use vaso for swelling       Patient will benefit from skilled therapeutic intervention in order to improve the following deficits and impairments:  Abnormal gait, Decreased activity tolerance, Decreased balance, Decreased mobility, Decreased strength, Increased edema, Impaired flexibility, Pain, Decreased range of motion, Difficulty walking  Visit Diagnosis: Localized edema  Pain in  right ankle and joints of right foot  Stiffness of right ankle, not elsewhere classified  Difficulty in walking, not elsewhere classified     Problem List Patient Active Problem List   Diagnosis Date Noted  . Bimalleolar ankle fracture, right, closed, initial encounter 03/20/2017  . Bimalleolar fracture of right ankle 03/20/2017  . Depression with anxiety 12/25/2011  . HTN (hypertension) 12/25/2011  . GERD (gastroesophageal reflux disease) 12/25/2011  . COPD, moderate ([place]) 12/25/2011  . Chronic constipation 12/25/2011    Scot Jun, PTA 06/14/2017, 8:44 AM  [place], [place], [address] Phone: [phone]   Fax:  [phone]  Name: Viktoriya Glaspy MRN: [id number] Date of Birth: 23-Sep-1945

## 2017-06-18 ENCOUNTER — Encounter: Payer: Self-pay | Admitting: Physical Therapy

## 2017-06-18 ENCOUNTER — Ambulatory Visit: Payer: Medicare Other | Admitting: Physical Therapy

## 2017-06-18 DIAGNOSIS — M25671 Stiffness of right ankle, not elsewhere classified: Secondary | ICD-10-CM

## 2017-06-18 DIAGNOSIS — M25571 Pain in right ankle and joints of right foot: Secondary | ICD-10-CM | POA: Diagnosis not present

## 2017-06-18 DIAGNOSIS — R600 Localized edema: Secondary | ICD-10-CM

## 2017-06-18 DIAGNOSIS — R262 Difficulty in walking, not elsewhere classified: Secondary | ICD-10-CM

## 2017-06-18 NOTE — Therapy (Signed)
[place], [place], [address] Phone: [phone]   Fax:  [phone]  Physical Therapy Treatment  Patient Details  Name: [name] MRN: [id number] Date of Birth: 01/04/1946 Referring Provider: Berenice Primas   Encounter Date: 06/18/2017  PT End of Session - 06/18/17 1530    Visit Number  9    Date for PT Re-Evaluation  07/17/17    PT Start Time  1430    PT Stop Time  1525    PT Time Calculation (min)  55 min    Activity Tolerance  Patient tolerated treatment well    Behavior During Therapy  Anxious;WFL for tasks assessed/performed       Past Medical History:  Diagnosis Date  . Allergy   . Ankle fracture    right  . Chronic hepatitis C ([place])   . Colon polyp   . COPD (chronic obstructive pulmonary disease) ([place])   . Diverticulitis   . Emphysema of lung ([place])   . Gallstones   . GERD (gastroesophageal reflux disease)   . Hypertension   . Kidney stones   . Osteoarthritis   . Spondylosis of cervical region without myelopathy or radiculopathy     Past Surgical History:  Procedure Laterality Date  . ABDOMINAL HYSTERECTOMY  1980  . BREAST BIOPSY  02/07/2010  . BREAST SURGERY  2011   biopsy  . CHOLECYSTECTOMY  1999  . ORIF ANKLE FRACTURE Right 03/20/2017   Procedure: OPEN REDUCTION INTERNAL FIXATION (ORIF) ANKLE FRACTURE;  Surgeon: Dorna Leitz, MD;  Location: [facility];  Service: Orthopedics;  Laterality: Right;  . TONSILLECTOMY AND ADENOIDECTOMY  1950's    There were no vitals filed for this visit.  Subjective Assessment - 06/18/17 1436    Subjective  "My ankle hurts, I took a pain pill before I came"    Currently in Pain?  Yes    Pain Score  2     Pain Location  Ankle                      [facility] Adult PT Treatment/Exercise - 06/18/17 0001      Modalities   Modalities  Vasopneumatic      Vasopneumatic   Number Minutes Vasopneumatic   15 minutes    Vasopnuematic Location   Ankle    Vasopneumatic Pressure  High    Vasopneumatic Temperature   32      Manual Therapy   Manual Therapy  Passive ROM    Passive ROM  R ankle all motions       Ankle Exercises: Aerobic   Stationary Bike  L1 x5    Elliptical  I7 R3 29fd/2rev    Tread Mill  forward and backward walking on treatmill       Ankle Exercises: Seated   Ankle Circles/Pumps  Right;AROM;20 reps    Other Seated Ankle Exercises  Ankle PF/DF red tband  x15      Ankle Exercises: Standing   Other Standing Ankle Exercises  Step downs 2 in x10, x5       Ankle Exercises: Machines for Strengthening   Cybex Leg Press  30lb 2x15               PT Short Term Goals - 05/21/17 1224      PT SHORT TERM GOAL #1   Title  independent with initial HEP    Status  Achieved  PT Long Term Goals - 05/21/17 1225      PT LONG TERM GOAL #1   Title  understand and perform RICE    Status  Partially Met      PT LONG TERM GOAL #2   Title  decrease pain 50%      PT LONG TERM GOAL #4   Title  walk 750 feet with minimal deviation    Status  On-going            Plan - 06/18/17 1531    Clinical Impression Statement  Pt progressed to treadmill walking froward and backwards. She remains guarded during MT. Performed step downs well but has some pain.    Rehab Potential  Good    PT Frequency  3x / week    PT Duration  8 weeks    PT Treatment/Interventions  ADLs/Self Care Home Management;Cryotherapy;Electrical Stimulation;Gait training;Balance training;Therapeutic exercise;Therapeutic activities;Functional mobility training;Stair training;Patient/family education;Manual techniques;Vasopneumatic Device    PT Next Visit Plan  add exercises, PROM to gait DF for better gait, can use vaso for swelling       Patient will benefit from skilled therapeutic intervention in order to improve the following deficits and impairments:  Abnormal gait, Decreased activity tolerance, Decreased  balance, Decreased mobility, Decreased strength, Increased edema, Impaired flexibility, Pain, Decreased range of motion, Difficulty walking  Visit Diagnosis: Stiffness of right ankle, not elsewhere classified  Pain in right ankle and joints of right foot  Localized edema  Difficulty in walking, not elsewhere classified     Problem List Patient Active Problem List   Diagnosis Date Noted  . Bimalleolar ankle fracture, right, closed, initial encounter 03/20/2017  . Bimalleolar fracture of right ankle 03/20/2017  . Depression with anxiety 12/25/2011  . HTN (hypertension) 12/25/2011  . GERD (gastroesophageal reflux disease) 12/25/2011  . COPD, moderate ([place]) 12/25/2011  . Chronic constipation 12/25/2011    Scot Jun, PTA 06/18/2017, 3:33 PM  [place], [place], [address] Phone: [phone]   Fax:  [phone]  Name: Vitalia Stough MRN: [id number] Date of Birth: [birth year]

## 2017-06-20 ENCOUNTER — Ambulatory Visit: Payer: Medicare Other | Admitting: Physical Therapy

## 2017-06-20 ENCOUNTER — Encounter: Payer: Self-pay | Admitting: Physical Therapy

## 2017-06-20 DIAGNOSIS — M25571 Pain in right ankle and joints of right foot: Secondary | ICD-10-CM

## 2017-06-20 DIAGNOSIS — M25671 Stiffness of right ankle, not elsewhere classified: Secondary | ICD-10-CM

## 2017-06-20 DIAGNOSIS — R262 Difficulty in walking, not elsewhere classified: Secondary | ICD-10-CM

## 2017-06-20 DIAGNOSIS — R600 Localized edema: Secondary | ICD-10-CM

## 2017-06-20 NOTE — Therapy (Signed)
[name], [name], [address] Phone: [phone]   Fax:  [phone]  Physical Therapy Treatment  Patient Details  Name: [name] MRN: [id number] Date of Birth: Oct 10, 1945 Referring Provider: Berenice Primas   Encounter Date: 06/20/2017  PT End of Session - 06/20/17 1057    Visit Number  10    Date for PT Re-Evaluation  07/17/17    PT Start Time  2409    PT Stop Time  1111    PT Time Calculation (min)  56 min    Activity Tolerance  Patient tolerated treatment well    Behavior During Therapy  Anxious;WFL for tasks assessed/performed       Past Medical History:  Diagnosis Date  . Allergy   . Ankle fracture    right  . Chronic hepatitis C ([place])   . Colon polyp   . COPD (chronic obstructive pulmonary disease) ([place])   . Diverticulitis   . Emphysema of lung ([place])   . Gallstones   . GERD (gastroesophageal reflux disease)   . Hypertension   . Kidney stones   . Osteoarthritis   . Spondylosis of cervical region without myelopathy or radiculopathy     Past Surgical History:  Procedure Laterality Date  . ABDOMINAL HYSTERECTOMY  1980  . BREAST BIOPSY  02/07/2010  . BREAST SURGERY  2011   biopsy  . CHOLECYSTECTOMY  1999  . ORIF ANKLE FRACTURE Right 03/20/2017   Procedure: OPEN REDUCTION INTERNAL FIXATION (ORIF) ANKLE FRACTURE;  Surgeon: Dorna Leitz, MD;  Location: [facility];  Service: Orthopedics;  Laterality: Right;  . TONSILLECTOMY AND ADENOIDECTOMY  1950's    There were no vitals filed for this visit.  Subjective Assessment - 06/20/17 1015    Subjective  "Ok"    Currently in Pain?  Yes    Pain Score  1     Pain Location  Ankle    Pain Orientation  Right                      [facility] Adult PT Treatment/Exercise - 06/20/17 0001      Modalities   Modalities  Vasopneumatic      Vasopneumatic   Number Minutes Vasopneumatic   15 minutes    Vasopnuematic  Location   Ankle    Vasopneumatic Pressure  High    Vasopneumatic Temperature   32      Ankle Exercises: Aerobic   Stationary Bike  L1 x3    Elliptical  I8 R4 2.71fd/2.5rev    Tread Mill  forward 236m and backward 1.10m66mwalking on treatmill       Ankle Exercises: Standing   Other Standing Ankle Exercises  heel and toe walking    Other Standing Ankle Exercises  Step downs 4in x10, x5       Ankle Exercises: Machines for Strengthening   Cybex Leg Press  40lb 2x10, Heel raises 2x15      Ankle Exercises: Stretches   Gastroc Stretch  4 reps;20 seconds    Slant Board Stretch  3 reps;10 seconds black bar      Ankle Exercises: Seated   Other Seated Ankle Exercises  Ankle PF/DF red tband  x15               PT Short Term Goals - 05/21/17 1224      PT SHORT TERM GOAL #1   Title  independent with initial HEP  Status  Achieved        PT Long Term Goals - 06/20/17 1059      PT LONG TERM GOAL #1   Title  understand and perform RICE    Status  Achieved      PT LONG TERM GOAL #2   Title  decrease pain 50%    Status  Partially Met      PT LONG TERM GOAL #3   Title  increase AROM DF to 5 degrees    Status  On-going            Plan - 06/20/17 1058    Clinical Impression Statement  All exercises completed well, pt does report pain with toe walking and step down from a 4 inch step. Pt with very little heel elevation with toe walking.     Rehab Potential  Good    PT Frequency  3x / week    PT Duration  8 weeks    PT Treatment/Interventions  ADLs/Self Care Home Management;Cryotherapy;Electrical Stimulation;Gait training;Balance training;Therapeutic exercise;Therapeutic activities;Functional mobility training;Stair training;Patient/family education;Manual techniques;Vasopneumatic Device    PT Next Visit Plan  add exercises, PROM to gait DF for better gait, can use vaso for swelling       Patient will benefit from skilled therapeutic intervention in order to improve  the following deficits and impairments:  Abnormal gait, Decreased activity tolerance, Decreased balance, Decreased mobility, Decreased strength, Increased edema, Impaired flexibility, Pain, Decreased range of motion, Difficulty walking  Visit Diagnosis: Stiffness of right ankle, not elsewhere classified  Pain in right ankle and joints of right foot  Localized edema  Difficulty in walking, not elsewhere classified     Problem List Patient Active Problem List   Diagnosis Date Noted  . Bimalleolar ankle fracture, right, closed, initial encounter 03/20/2017  . Bimalleolar fracture of right ankle 03/20/2017  . Depression with anxiety 12/25/2011  . HTN (hypertension) 12/25/2011  . GERD (gastroesophageal reflux disease) 12/25/2011  . COPD, moderate ([place]) 12/25/2011  . Chronic constipation 12/25/2011    [name], [name] 06/20/2017, 11:00 AM  [name], [name], [address] Phone: [phone]   Fax:  [phone]  Name: [name] MRN: [id number] Date of Birth: [birth year]

## 2017-06-24 ENCOUNTER — Encounter: Payer: Self-pay | Admitting: Physical Therapy

## 2017-06-24 ENCOUNTER — Ambulatory Visit: Payer: Medicare Other | Admitting: Physical Therapy

## 2017-06-24 DIAGNOSIS — M25571 Pain in right ankle and joints of right foot: Secondary | ICD-10-CM

## 2017-06-24 DIAGNOSIS — R262 Difficulty in walking, not elsewhere classified: Secondary | ICD-10-CM

## 2017-06-24 DIAGNOSIS — R600 Localized edema: Secondary | ICD-10-CM

## 2017-06-24 DIAGNOSIS — M25671 Stiffness of right ankle, not elsewhere classified: Secondary | ICD-10-CM

## 2017-06-24 NOTE — Therapy (Signed)
[address] [place], [place], [address] Phone: [phone]   Fax:  [phone]  Physical Therapy Treatment  Patient Details  Name: [name] MRN: [id number] Date of Birth: Aug 09, 1945 Referring Provider: Berenice Primas   Encounter Date: 06/24/2017  PT End of Session - 06/24/17 1007    Visit Number  11    Date for PT Re-Evaluation  07/17/17    PT Start Time  0928    PT Stop Time  1024    PT Time Calculation (min)  56 min    Activity Tolerance  Patient tolerated treatment well    Behavior During Therapy  Anxious;WFL for tasks assessed/performed       Past Medical History:  Diagnosis Date  . Allergy   . Ankle fracture    right  . Chronic hepatitis C ([place])   . Colon polyp   . COPD (chronic obstructive pulmonary disease) ([place])   . Diverticulitis   . Emphysema of lung ([place])   . Gallstones   . GERD (gastroesophageal reflux disease)   . Hypertension   . Kidney stones   . Osteoarthritis   . Spondylosis of cervical region without myelopathy or radiculopathy     Past Surgical History:  Procedure Laterality Date  . ABDOMINAL HYSTERECTOMY  1980  . BREAST BIOPSY  02/07/2010  . BREAST SURGERY  2011   biopsy  . CHOLECYSTECTOMY  1999  . ORIF ANKLE FRACTURE Right 03/20/2017   Procedure: OPEN REDUCTION INTERNAL FIXATION (ORIF) ANKLE FRACTURE;  Surgeon: Dorna Leitz, MD;  Location: [facility];  Service: Orthopedics;  Laterality: Right;  . TONSILLECTOMY AND ADENOIDECTOMY  1950's    There were no vitals filed for this visit.  Subjective Assessment - 06/24/17 0934    Subjective  "Fine"    Currently in Pain?  No/denies    Pain Score  0-No pain                      [facility] Adult PT Treatment/Exercise - 06/24/17 0001      Vasopneumatic   Number Minutes Vasopneumatic   15 minutes    Vasopnuematic Location   Ankle    Vasopneumatic Pressure  Medium    Vasopneumatic Temperature   32       Ankle Exercises: Aerobic   Stationary Bike  L1 x4    Elliptical  I8 R4 2 frd/2rev      Ankle Exercises: Machines for Strengthening   Cybex Leg Press  30lb 2x15, Heel raises 2x15      Ankle Exercises: Standing   Other Standing Ankle Exercises  resisted gaisn 20lb 4 way x 3 each    Other Standing Ankle Exercises  Step downs 4in x10, x5       Ankle Exercises: Stretches   Gastroc Stretch  4 reps;20 seconds    Slant Board Stretch  -- pro stretch               PT Short Term Goals - 05/21/17 1224      PT SHORT TERM GOAL #1   Title  independent with initial HEP    Status  Achieved        PT Long Term Goals - 06/20/17 1059      PT LONG TERM GOAL #1   Title  understand and perform RICE    Status  Achieved      PT LONG TERM GOAL #2   Title  decrease  pain 50%    Status  Partially Met      PT LONG TERM GOAL #3   Title  increase AROM DF to 5 degrees    Status  On-going            Plan - 06/24/17 1009    Clinical Impression Statement  Pt reports some pain when stretching with the pro stretch. Some compensation with step downs but it has improved. Cues to strike with toes during backwards walking.     Rehab Potential  Good    PT Treatment/Interventions  ADLs/Self Care Home Management;Cryotherapy;Electrical Stimulation;Gait training;Balance training;Therapeutic exercise;Therapeutic activities;Functional mobility training;Stair training;Patient/family education;Manual techniques;Vasopneumatic Device    PT Next Visit Plan  add exercises, PROM to gait DF for better gait, can use vaso for swelling    Consulted and Agree with Plan of Care  Patient       Patient will benefit from skilled therapeutic intervention in order to improve the following deficits and impairments:  Abnormal gait, Decreased activity tolerance, Decreased balance, Decreased mobility, Decreased strength, Increased edema, Impaired flexibility, Pain, Decreased range of motion, Difficulty  walking  Visit Diagnosis: Pain in right ankle and joints of right foot  Stiffness of right ankle, not elsewhere classified  Localized edema  Difficulty in walking, not elsewhere classified     Problem List Patient Active Problem List   Diagnosis Date Noted  . Bimalleolar ankle fracture, right, closed, initial encounter 03/20/2017  . Bimalleolar fracture of right ankle 03/20/2017  . Depression with anxiety 12/25/2011  . HTN (hypertension) 12/25/2011  . GERD (gastroesophageal reflux disease) 12/25/2011  . COPD, moderate ([place]) 12/25/2011  . Chronic constipation 12/25/2011    Scot Jun, PTA 06/24/2017, 10:12 AM  [facility] [place] [address] [place], [place], [address] Phone: [phone]   Fax:  [phone]  Name: [name] MRN: [id number] Date of Birth: July 17, 1945

## 2017-06-27 ENCOUNTER — Ambulatory Visit: Payer: Medicare Other | Admitting: Physical Therapy

## 2017-06-27 ENCOUNTER — Encounter: Payer: Self-pay | Admitting: Physical Therapy

## 2017-06-27 DIAGNOSIS — M25571 Pain in right ankle and joints of right foot: Secondary | ICD-10-CM | POA: Diagnosis not present

## 2017-06-27 DIAGNOSIS — M25671 Stiffness of right ankle, not elsewhere classified: Secondary | ICD-10-CM

## 2017-06-27 DIAGNOSIS — R600 Localized edema: Secondary | ICD-10-CM

## 2017-06-27 DIAGNOSIS — R262 Difficulty in walking, not elsewhere classified: Secondary | ICD-10-CM

## 2017-06-27 NOTE — Therapy (Signed)
[place], [place], [address] Phone: [phone]   Fax:  [phone]  Physical Therapy Treatment  Patient Details  Name: [name] MRN: [id number] Date of Birth: 13-Sep-1945 Referring Provider: Berenice Primas   Encounter Date: 06/27/2017  PT End of Session - 06/27/17 1058    Visit Number  12    Date for PT Re-Evaluation  07/17/17    PT Start Time  5681    PT Stop Time  1112    PT Time Calculation (min)  57 min    Activity Tolerance  Patient tolerated treatment well    Behavior During Therapy  Anxious;WFL for tasks assessed/performed       Past Medical History:  Diagnosis Date  . Allergy   . Ankle fracture    right  . Chronic hepatitis C ([place])   . Colon polyp   . COPD (chronic obstructive pulmonary disease) ([place])   . Diverticulitis   . Emphysema of lung ([place])   . Gallstones   . GERD (gastroesophageal reflux disease)   . Hypertension   . Kidney stones   . Osteoarthritis   . Spondylosis of cervical region without myelopathy or radiculopathy     Past Surgical History:  Procedure Laterality Date  . ABDOMINAL HYSTERECTOMY  1980  . BREAST BIOPSY  02/07/2010  . BREAST SURGERY  2011   biopsy  . CHOLECYSTECTOMY  1999  . ORIF ANKLE FRACTURE Right 03/20/2017   Procedure: OPEN REDUCTION INTERNAL FIXATION (ORIF) ANKLE FRACTURE;  Surgeon: Dorna Leitz, MD;  Location: [facility];  Service: Orthopedics;  Laterality: Right;  . TONSILLECTOMY AND ADENOIDECTOMY  1950's    There were no vitals filed for this visit.  Subjective Assessment - 06/27/17 1017    Subjective  "OK" I get more nerve pain than anything"    Currently in Pain?  Yes    Pain Score  3     Pain Location  Foot    Pain Orientation  Right                      [facility] Adult PT Treatment/Exercise - 06/27/17 0001      Ambulation/Gait   Ambulation/Gait  Yes    Ambulation/Gait Assistance  6: Modified  independent (Device/Increase time)    Assistive device  None    Gait Pattern  Decreased step length - left;Decreased stride length;Decreased stance time - right    Ambulation Surface  Paved;Outdoor up [employer]  Yes    Stairs Assistance  5: Supervision    Stair Management Technique  One rail Right;Alternating pattern    Number of Stairs  24    Height of Stairs  6    Gait Comments  one flight of stairs, then back down out side then up  hill, R ankle pain with stair negotiation due to decrease DF. Pt lands hard on LLE when stepping down       High Level Balance   High Level Balance Comments  SLS RLE 10'' x5 1 finger assist      Vasopneumatic   Number Minutes Vasopneumatic   15 minutes    Vasopnuematic Location   Ankle    Vasopneumatic Pressure  Medium    Vasopneumatic Temperature   32      Ankle Exercises: Aerobic   Stationary Bike  L1 x4    Elliptical  I13 R5 2 frd/2rev  Ankle Exercises: Machines for Strengthening   Cybex Leg Press  20lb RLE 2x10, heel raises 20lb x30      Ankle Exercises: Standing   Other Standing Ankle Exercises  toe and heel walking; Heel and toe raises on airex 2x15     Other Standing Ankle Exercises  Step downs 4in x10, x5       Ankle Exercises: Stretches   Gastroc Stretch  4 reps;20 seconds pro stretch    Slant Board Stretch  -- pro stretch                PT Short Term Goals - 05/21/17 1224      PT SHORT TERM GOAL #1   Title  independent with initial HEP    Status  Achieved        PT Long Term Goals - 06/27/17 1059      PT LONG TERM GOAL #1   Title  understand and perform RICE    Status  Achieved      PT LONG TERM GOAL #2   Title  decrease pain 50%    Status  Partially Met      PT LONG TERM GOAL #3   Title  increase AROM DF to 5 degrees    Status  On-going      PT LONG TERM GOAL #4   Title  walk 750 feet with minimal deviation    Status  On-going      PT LONG TERM GOAL #5   Title  increase strength to 4/5     Status  On-going            Plan - 06/27/17 1058    Clinical Impression Statement  Pt with decrease R ankle DF making it difficult to negotiate down stairs. Some pain with gastroc pro stretch and step downs. Cues to promote heel strike with up hill ambulation.     Rehab Potential  Good    PT Frequency  3x / week    PT Duration  8 weeks    PT Treatment/Interventions  ADLs/Self Care Home Management;Cryotherapy;Electrical Stimulation;Gait training;Balance training;Therapeutic exercise;Therapeutic activities;Functional mobility training;Stair training;Patient/family education;Manual techniques;Vasopneumatic Device    PT Next Visit Plan  add exercises, PROM to gait DF for better gait, can use vaso for swelling       Patient will benefit from skilled therapeutic intervention in order to improve the following deficits and impairments:  Abnormal gait, Decreased activity tolerance, Decreased balance, Decreased mobility, Decreased strength, Increased edema, Impaired flexibility, Pain, Decreased range of motion, Difficulty walking  Visit Diagnosis: Pain in right ankle and joints of right foot  Stiffness of right ankle, not elsewhere classified  Localized edema  Difficulty in walking, not elsewhere classified     Problem List Patient Active Problem List   Diagnosis Date Noted  . Bimalleolar ankle fracture, right, closed, initial encounter 03/20/2017  . Bimalleolar fracture of right ankle 03/20/2017  . Depression with anxiety 12/25/2011  . HTN (hypertension) 12/25/2011  . GERD (gastroesophageal reflux disease) 12/25/2011  . COPD, moderate ([place]) 12/25/2011  . Chronic constipation 12/25/2011    Scot Jun, PTA 06/27/2017, 11:00 AM  [place], [place], [address] Phone: [phone]   Fax:  [phone]  Name: Rilie Glanz MRN: [id number] Date of Birth: Nov 10, 1945

## 2017-07-01 ENCOUNTER — Encounter: Payer: Self-pay | Admitting: Physical Therapy

## 2017-07-01 ENCOUNTER — Ambulatory Visit: Payer: Medicare Other | Admitting: Physical Therapy

## 2017-07-01 DIAGNOSIS — M25571 Pain in right ankle and joints of right foot: Secondary | ICD-10-CM | POA: Diagnosis not present

## 2017-07-01 DIAGNOSIS — R262 Difficulty in walking, not elsewhere classified: Secondary | ICD-10-CM

## 2017-07-01 DIAGNOSIS — R600 Localized edema: Secondary | ICD-10-CM

## 2017-07-01 DIAGNOSIS — M25671 Stiffness of right ankle, not elsewhere classified: Secondary | ICD-10-CM

## 2017-07-01 NOTE — Therapy (Signed)
[place], [place], [address] Phone: [phone]   Fax:  [phone]  Physical Therapy Treatment  Patient Details  Name: [name] MRN: [id number] Date of Birth: 1945/08/05 Referring Provider: Berenice Primas   Encounter Date: 07/01/2017  PT End of Session - 07/01/17 1055    Visit Number  13    Date for PT Re-Evaluation  07/17/17    PT Start Time  7078    PT Stop Time  1110    PT Time Calculation (min)  55 min    Activity Tolerance  Patient tolerated treatment well    Behavior During Therapy  Anxious;WFL for tasks assessed/performed       Past Medical History:  Diagnosis Date  . Allergy   . Ankle fracture    right  . Chronic hepatitis C ([place])   . Colon polyp   . COPD (chronic obstructive pulmonary disease) ([place])   . Diverticulitis   . Emphysema of lung ([place])   . Gallstones   . GERD (gastroesophageal reflux disease)   . Hypertension   . Kidney stones   . Osteoarthritis   . Spondylosis of cervical region without myelopathy or radiculopathy     Past Surgical History:  Procedure Laterality Date  . ABDOMINAL HYSTERECTOMY  1980  . BREAST BIOPSY  02/07/2010  . BREAST SURGERY  2011   biopsy  . CHOLECYSTECTOMY  1999  . ORIF ANKLE FRACTURE Right 03/20/2017   Procedure: OPEN REDUCTION INTERNAL FIXATION (ORIF) ANKLE FRACTURE;  Surgeon: Dorna Leitz, MD;  Location: [facility];  Service: Orthopedics;  Laterality: Right;  . TONSILLECTOMY AND ADENOIDECTOMY  1950's    There were no vitals filed for this visit.  Subjective Assessment - 07/01/17 1015    Subjective  Pt reports nerve pain last night jumping around. Pt reports that where her ankle bends dose not feel right.    Currently in Pain?  No/denies    Pain Score  0-No pain                      [facility] Adult PT Treatment/Exercise - 07/01/17 0001      Manual Therapy   Manual Therapy  Passive ROM    Passive ROM   R ankle all motions       Ankle Exercises: Aerobic   Stationary Bike  L1 x3    Elliptical  I13 R5 2.5 frd/2.5rev    Tread Mill  forward 41mh and backward 1.583m walking on treatmill       Ankle Exercises: Machines for Strengthening   Cybex Leg Press  20lb RLE 2x10, heel raises 20lb 2x10      Ankle Exercises: Seated   Other Seated Ankle Exercises  4 way ankle PRE's x15 each       Ankle Exercises: Standing   Other Standing Ankle Exercises  toe and heel walking; Heel and toe raises on airex 2x15                PT Short Term Goals - 05/21/17 1224      PT SHORT TERM GOAL #1   Title  independent with initial HEP    Status  Achieved        PT Long Term Goals - 06/27/17 1059      PT LONG TERM GOAL #1   Title  understand and perform RICE    Status  Achieved      PT LONG  TERM GOAL #2   Title  decrease pain 50%    Status  Partially Met      PT LONG TERM GOAL #3   Title  increase AROM DF to 5 degrees    Status  On-going      PT LONG TERM GOAL #4   Title  walk 750 feet with minimal deviation    Status  On-going      PT LONG TERM GOAL #5   Title  increase strength to 4/5    Status  On-going            Plan - 07/01/17 1055    Clinical Impression Statement  Pt guarded with MT due to reports of increase pain. Inversion and eversion very limited with resisted bands. Poor elevation with both toe and heel walking.     Rehab Potential  Good    PT Frequency  3x / week    PT Duration  8 weeks    PT Treatment/Interventions  ADLs/Self Care Home Management;Cryotherapy;Electrical Stimulation;Gait training;Balance training;Therapeutic exercise;Therapeutic activities;Functional mobility training;Stair training;Patient/family education;Manual techniques;Vasopneumatic Device    PT Next Visit Plan  add exercises, PROM to gait DF for better gait, can use vaso for swelling       Patient will benefit from skilled therapeutic intervention in order to improve the following  deficits and impairments:  Abnormal gait, Decreased activity tolerance, Decreased balance, Decreased mobility, Decreased strength, Increased edema, Impaired flexibility, Pain, Decreased range of motion, Difficulty walking  Visit Diagnosis: Pain in right ankle and joints of right foot  Stiffness of right ankle, not elsewhere classified  Localized edema  Difficulty in walking, not elsewhere classified     Problem List Patient Active Problem List   Diagnosis Date Noted  . Bimalleolar ankle fracture, right, closed, initial encounter 03/20/2017  . Bimalleolar fracture of right ankle 03/20/2017  . Depression with anxiety 12/25/2011  . HTN (hypertension) 12/25/2011  . GERD (gastroesophageal reflux disease) 12/25/2011  . COPD, moderate ([place]) 12/25/2011  . Chronic constipation 12/25/2011    Scot Jun, PTA 07/01/2017, 10:58 AM  [place], [place], [address] Phone: [phone]   Fax:  [phone]  Name: Aritha Huckeba MRN: [id number] Date of Birth: Jul 21, 1945

## 2017-07-04 ENCOUNTER — Ambulatory Visit: Payer: Medicare Other | Attending: Orthopedic Surgery | Admitting: Physical Therapy

## 2017-07-04 ENCOUNTER — Encounter: Payer: Self-pay | Admitting: Physical Therapy

## 2017-07-04 DIAGNOSIS — R262 Difficulty in walking, not elsewhere classified: Secondary | ICD-10-CM | POA: Insufficient documentation

## 2017-07-04 DIAGNOSIS — M25671 Stiffness of right ankle, not elsewhere classified: Secondary | ICD-10-CM | POA: Diagnosis present

## 2017-07-04 DIAGNOSIS — M25571 Pain in right ankle and joints of right foot: Secondary | ICD-10-CM | POA: Diagnosis not present

## 2017-07-04 DIAGNOSIS — R600 Localized edema: Secondary | ICD-10-CM

## 2017-07-04 NOTE — Therapy (Signed)
[place], [place], [address] Phone: [phone]   Fax:  [phone]  Physical Therapy Treatment  Patient Details  Name: [name] MRN: [id number] Date of Birth: 1946-03-17 Referring Provider: Berenice Primas   Encounter Date: 07/04/2017  PT End of Session - 07/04/17 1016    Visit Number  14    Date for PT Re-Evaluation  07/17/17    PT Start Time  0930    PT Stop Time  1030    PT Time Calculation (min)  60 min    Activity Tolerance  Patient tolerated treatment well    Behavior During Therapy  [facility] for tasks assessed/performed       Past Medical History:  Diagnosis Date  . Allergy   . Ankle fracture    right  . Chronic hepatitis C ([place])   . Colon polyp   . COPD (chronic obstructive pulmonary disease) ([place])   . Diverticulitis   . Emphysema of lung ([place])   . Gallstones   . GERD (gastroesophageal reflux disease)   . Hypertension   . Kidney stones   . Osteoarthritis   . Spondylosis of cervical region without myelopathy or radiculopathy     Past Surgical History:  Procedure Laterality Date  . ABDOMINAL HYSTERECTOMY  1980  . BREAST BIOPSY  02/07/2010  . BREAST SURGERY  2011   biopsy  . CHOLECYSTECTOMY  1999  . ORIF ANKLE FRACTURE Right 03/20/2017   Procedure: OPEN REDUCTION INTERNAL FIXATION (ORIF) ANKLE FRACTURE;  Surgeon: Dorna Leitz, MD;  Location: [facility];  Service: Orthopedics;  Laterality: Right;  . TONSILLECTOMY AND ADENOIDECTOMY  1950's    There were no vitals filed for this visit.  Subjective Assessment - 07/04/17 0935    Subjective  "Fine"    Currently in Pain?  Yes    Pain Score  1     Pain Location  Foot    Pain Orientation  Right                      [facility] Adult PT Treatment/Exercise - 07/04/17 0001      Vasopneumatic   Number Minutes Vasopneumatic   15 minutes    Vasopnuematic Location   Ankle    Vasopneumatic Pressure  High    Vasopneumatic Temperature   32      Manual Therapy   Manual Therapy  Passive ROM    Passive ROM  R ankle all motions       Ankle Exercises: Aerobic   Stationary Bike  L1 x5    Elliptical  I13 R5 2 frd/2rev    Tread Mill  forward 63mh and backward 1.545m walking on treatmill       Ankle Exercises: Seated   Ankle Circles/Pumps  Right;AROM;20 reps    Other Seated Ankle Exercises  4 way ankle PRE's x15 each       Ankle Exercises: Machines for Strengthening   Cybex Leg Press  20lb RLE 2x10, heel raises 20lb 2x15      Ankle Exercises: Standing   Other Standing Ankle Exercises  toe and heel walking; Heel and toe raises on airex 2x15                PT Short Term Goals - 05/21/17 1224      PT SHORT TERM GOAL #1   Title  independent with initial HEP    Status  Achieved  PT Long Term Goals - 06/27/17 1059      PT LONG TERM GOAL #1   Title  understand and perform RICE    Status  Achieved      PT LONG TERM GOAL #2   Title  decrease pain 50%    Status  Partially Met      PT LONG TERM GOAL #3   Title  increase AROM DF to 5 degrees    Status  On-going      PT LONG TERM GOAL #4   Title  walk 750 feet with minimal deviation    Status  On-going      PT LONG TERM GOAL #5   Title  increase strength to 4/5    Status  On-going            Plan - 07/04/17 1016    Clinical Impression Statement  Pt very guarded with MT, appears to be self limiting at times. She stated the plate in her ankle hurts. Little elevation with both toe and heel walking. Little movement with active inversion and eversion.    Rehab Potential  Good    PT Frequency  3 / week    PT Duration  8 weeks    PT Treatment/Interventions  ADLs/Self Care Home Management;Cryotherapy;Electrical Stimulation;Gait training;Balance training;Therapeutic exercise;Therapeutic activities;Functional mobility training;Stair training;Patient/family education;Manual techniques;Vasopneumatic Device    PT Next Visit  Plan  add exercises, PROM to gait DF for better gait, can use vaso for swelling       Patient will benefit from skilled therapeutic intervention in order to improve the following deficits and impairments:  Abnormal gait, Decreased activity tolerance, Decreased balance, Decreased mobility, Decreased strength, Increased edema, Impaired flexibility, Pain, Decreased range of motion, Difficulty walking  Visit Diagnosis: Pain in right ankle and joints of right foot  Stiffness of right ankle, not elsewhere classified  Localized edema  Difficulty in walking, not elsewhere classified     Problem List Patient Active Problem List   Diagnosis Date Noted  . Bimalleolar ankle fracture, right, closed, initial encounter 03/20/2017  . Bimalleolar fracture of right ankle 03/20/2017  . Depression with anxiety 12/25/2011  . HTN (hypertension) 12/25/2011  . GERD (gastroesophageal reflux disease) 12/25/2011  . COPD, moderate ([place]) 12/25/2011  . Chronic constipation 12/25/2011    Scot Jun, PTA 07/04/2017, 10:21 AM  [address] [place] [address] [place], [place], [address] Phone: [phone]   Fax:  [phone]  Name: [name] MRN: [id number] Date of Birth: August 27, 1945

## 2017-07-08 ENCOUNTER — Other Ambulatory Visit: Payer: Self-pay | Admitting: Family Medicine

## 2017-07-08 DIAGNOSIS — Z1231 Encounter for screening mammogram for malignant neoplasm of breast: Secondary | ICD-10-CM

## 2017-07-09 ENCOUNTER — Encounter: Payer: Self-pay | Admitting: Physical Therapy

## 2017-07-09 ENCOUNTER — Ambulatory Visit: Payer: Medicare Other | Admitting: Physical Therapy

## 2017-07-09 DIAGNOSIS — R262 Difficulty in walking, not elsewhere classified: Secondary | ICD-10-CM

## 2017-07-09 DIAGNOSIS — M25571 Pain in right ankle and joints of right foot: Secondary | ICD-10-CM

## 2017-07-09 DIAGNOSIS — R600 Localized edema: Secondary | ICD-10-CM

## 2017-07-09 DIAGNOSIS — M25671 Stiffness of right ankle, not elsewhere classified: Secondary | ICD-10-CM

## 2017-07-09 NOTE — Therapy (Signed)
[place], [place], [address] Phone: [phone]   Fax:  [phone]  Physical Therapy Treatment  Patient Details  Name: [name] MRN: [id number] Date of Birth: Aug 10, 1945 Referring Provider: Berenice Primas   Encounter Date: 07/09/2017  PT End of Session - 07/09/17 1152    Visit Number  15    Date for PT Re-Evaluation  07/17/17    PT Start Time  1100    PT Stop Time  1205    PT Time Calculation (min)  65 min    Activity Tolerance  Patient tolerated treatment well    Behavior During Therapy  [facility] for tasks assessed/performed       Past Medical History:  Diagnosis Date  . Allergy   . Ankle fracture    right  . Chronic hepatitis C ([place])   . Colon polyp   . COPD (chronic obstructive pulmonary disease) ([place])   . Diverticulitis   . Emphysema of lung ([place])   . Gallstones   . GERD (gastroesophageal reflux disease)   . Hypertension   . Kidney stones   . Osteoarthritis   . Spondylosis of cervical region without myelopathy or radiculopathy     Past Surgical History:  Procedure Laterality Date  . ABDOMINAL HYSTERECTOMY  1980  . BREAST BIOPSY  02/07/2010  . BREAST SURGERY  2011   biopsy  . CHOLECYSTECTOMY  1999  . ORIF ANKLE FRACTURE Right 03/20/2017   Procedure: OPEN REDUCTION INTERNAL FIXATION (ORIF) ANKLE FRACTURE;  Surgeon: Dorna Leitz, MD;  Location: [facility];  Service: Orthopedics;  Laterality: Right;  . TONSILLECTOMY AND ADENOIDECTOMY  1950's    There were no vitals filed for this visit.  Subjective Assessment - 07/09/17 1101    Subjective  "Im doing fine" Pt reports ankle pain that lasted a few days after last treatment.    Currently in Pain?  Yes    Pain Score  0-No pain    Pain Location  Ankle    Pain Orientation  Right         [facility] PT Assessment - 07/09/17 0001      AROM   Right/Left Ankle  Right    Right Ankle Dorsiflexion  1    Right Ankle Plantar  Flexion  51    Right Ankle Inversion  35    Right Ankle Eversion  25                  [facility] Adult PT Treatment/Exercise - 07/09/17 0001      Ambulation/Gait   Gait Comments  one flight of stairs, then back down out side then up  hill, R ankle pain with stair negotriation due to decrease DF. Pt lands hard on LLE when stepping down       Vasopneumatic   Number Minutes Vasopneumatic   15 minutes    Vasopnuematic Location   Ankle    Vasopneumatic Pressure  Medium    Vasopneumatic Temperature   32      Manual Therapy   Manual Therapy  Passive ROM    Passive ROM  R ankle all motions       Ankle Exercises: Aerobic   Elliptical  I13 R5 2 frd/2rev      Ankle Exercises: Machines for Strengthening   Cybex Leg Press  30lb 2x15, heel raises 30lb 2x10       Ankle Exercises: Seated   Other Seated Ankle Exercises  4  way ankle PRE's x15 each                PT Short Term Goals - 05/21/17 1224      PT SHORT TERM GOAL #1   Title  independent with initial HEP    Status  Achieved        PT Long Term Goals - 06/27/17 1059      PT LONG TERM GOAL #1   Title  understand and perform RICE    Status  Achieved      PT LONG TERM GOAL #2   Title  decrease pain 50%    Status  Partially Met      PT LONG TERM GOAL #3   Title  increase AROM DF to 5 degrees    Status  On-going      PT LONG TERM GOAL #4   Title  walk 750 feet with minimal deviation    Status  On-going      PT LONG TERM GOAL #5   Title  increase strength to 4/5    Status  On-going            Plan - 07/09/17 1153    Clinical Impression Statement  Pt remains very limited with R ankle dorsiflexion. Pt walks with a mild limit due to decrease dorsiflexion. Lands hard on LLE when going down stairs. Pt reports that the stretching at home is not going as good as she would like.    Rehab Potential  Good    PT Frequency  3x / week    PT Duration  8 weeks    PT Treatment/Interventions  ADLs/Self Care Home  Management;Cryotherapy;Electrical Stimulation;Gait training;Balance training;Therapeutic exercise;Therapeutic activities;Functional mobility training;Stair training;Patient/family education;Manual techniques;Vasopneumatic Device    PT Next Visit Plan  add exercises, PROM to gait DF for better gait, can use vaso for swelling       Patient will benefit from skilled therapeutic intervention in order to improve the following deficits and impairments:  Abnormal gait, Decreased activity tolerance, Decreased balance, Decreased mobility, Decreased strength, Increased edema, Impaired flexibility, Pain, Decreased range of motion, Difficulty walking  Visit Diagnosis: Pain in right ankle and joints of right foot  Stiffness of right ankle, not elsewhere classified  Localized edema  Difficulty in walking, not elsewhere classified     Problem List Patient Active Problem List   Diagnosis Date Noted  . Bimalleolar ankle fracture, right, closed, initial encounter 03/20/2017  . Bimalleolar fracture of right ankle 03/20/2017  . Depression with anxiety 12/25/2011  . HTN (hypertension) 12/25/2011  . GERD (gastroesophageal reflux disease) 12/25/2011  . COPD, moderate ([place]) 12/25/2011  . Chronic constipation 12/25/2011    Scot Jun, PTA 07/09/2017, 11:58 AM  [place], [place], [address] Phone: [phone]   Fax:  [phone]  Name: [name] MRN: [id number] Date of Birth: 04-04-1946

## 2017-07-11 ENCOUNTER — Ambulatory Visit: Payer: Medicare Other | Admitting: Physical Therapy

## 2017-07-11 ENCOUNTER — Encounter: Payer: Self-pay | Admitting: Physical Therapy

## 2017-07-11 ENCOUNTER — Other Ambulatory Visit: Payer: Self-pay | Admitting: Family Medicine

## 2017-07-11 DIAGNOSIS — R600 Localized edema: Secondary | ICD-10-CM

## 2017-07-11 DIAGNOSIS — R5381 Other malaise: Secondary | ICD-10-CM

## 2017-07-11 DIAGNOSIS — R262 Difficulty in walking, not elsewhere classified: Secondary | ICD-10-CM

## 2017-07-11 DIAGNOSIS — M25571 Pain in right ankle and joints of right foot: Secondary | ICD-10-CM

## 2017-07-11 DIAGNOSIS — M25671 Stiffness of right ankle, not elsewhere classified: Secondary | ICD-10-CM

## 2017-07-11 NOTE — Therapy (Signed)
[place], [place], [address] Phone: [phone]   Fax:  [phone]  Physical Therapy Treatment  Patient Details  Name: [name] MRN: [id number] Date of Birth: 1946-04-12 Referring Provider: Berenice Primas   Encounter Date: 07/11/2017  PT End of Session - 07/11/17 1512    Visit Number  16    Date for PT Re-Evaluation  07/17/17    PT Start Time  1430    PT Stop Time  1526    PT Time Calculation (min)  56 min    Activity Tolerance  Patient tolerated treatment well    Behavior During Therapy  [facility] for tasks assessed/performed       Past Medical History:  Diagnosis Date  . Allergy   . Ankle fracture    right  . Chronic hepatitis C ([place])   . Colon polyp   . COPD (chronic obstructive pulmonary disease) ([place])   . Diverticulitis   . Emphysema of lung ([place])   . Gallstones   . GERD (gastroesophageal reflux disease)   . Hypertension   . Kidney stones   . Osteoarthritis   . Spondylosis of cervical region without myelopathy or radiculopathy     Past Surgical History:  Procedure Laterality Date  . ABDOMINAL HYSTERECTOMY  1980  . BREAST BIOPSY  02/07/2010  . BREAST SURGERY  2011   biopsy  . CHOLECYSTECTOMY  1999  . ORIF ANKLE FRACTURE Right 03/20/2017   Procedure: OPEN REDUCTION INTERNAL FIXATION (ORIF) ANKLE FRACTURE;  Surgeon: Dorna Leitz, MD;  Location: [facility];  Service: Orthopedics;  Laterality: Right;  . TONSILLECTOMY AND ADENOIDECTOMY  1950's    There were no vitals filed for this visit.  Subjective Assessment - 07/11/17 1434    Subjective  Pt reports that's her MD was happy with her progress. She also reports that her hardware could be take out in two months.    Currently in Pain?  Yes    Pain Score  2     Pain Location  Ankle    Pain Orientation  Right;Lateral                      [facility] Adult PT Treatment/Exercise - 07/11/17 0001      Ambulation/Gait   Gait Comments  3 flight of stairs, , R ankle pain with stair negotriation due to decrease DF. Pt lands hard on LLE when stepping down       Vasopneumatic   Number Minutes Vasopneumatic   15 minutes    Vasopnuematic Location   Ankle    Vasopneumatic Pressure  Medium    Vasopneumatic Temperature   32      Ankle Exercises: Aerobic   Elliptical  I15 R5 2 frd/2rev      Ankle Exercises: Machines for Strengthening   Cybex Leg Press  40lb 2x15, heel raises 40lb 2x10       Ankle Exercises: Seated   Other Seated Ankle Exercises  Sit to stand LE on dyna disk 2x10     Other Seated Ankle Exercises  4 way ankle PRE's x15 each       Ankle Exercises: Standing   Other Standing Ankle Exercises  toe and heel walking; Heel and toe raises on airex 2x15                PT Short Term Goals - 05/21/17 1224      PT SHORT TERM GOAL #1  Title  independent with initial HEP    Status  Achieved        PT Long Term Goals - 06/27/17 1059      PT LONG TERM GOAL #1   Title  understand and perform RICE    Status  Achieved      PT LONG TERM GOAL #2   Title  decrease pain 50%    Status  Partially Met      PT LONG TERM GOAL #3   Title  increase AROM DF to 5 degrees    Status  On-going      PT LONG TERM GOAL #4   Title  walk 750 feet with minimal deviation    Status  On-going      PT LONG TERM GOAL #5   Title  increase strength to 4/5    Status  On-going            Plan - 07/11/17 1512    Clinical Impression Statement  Pt appears to have better mobility with gait and stair negotiation. Does display some weakness with toe walking and SL heel raises.     Rehab Potential  Good    PT Frequency  3x / week    PT Duration  8 weeks    PT Treatment/Interventions  ADLs/Self Care Home Management;Cryotherapy;Electrical Stimulation;Gait training;Balance training;Therapeutic exercise;Therapeutic activities;Functional mobility training;Stair training;Patient/family  education;Manual techniques;Vasopneumatic Device    PT Next Visit Plan  add exercises, PROM to gait DF for better gait, can use vaso for swelling       Patient will benefit from skilled therapeutic intervention in order to improve the following deficits and impairments:  Abnormal gait, Decreased activity tolerance, Decreased balance, Decreased mobility, Decreased strength, Increased edema, Impaired flexibility, Pain, Decreased range of motion, Difficulty walking  Visit Diagnosis: Pain in right ankle and joints of right foot  Stiffness of right ankle, not elsewhere classified  Difficulty in walking, not elsewhere classified  Localized edema     Problem List Patient Active Problem List   Diagnosis Date Noted  . Bimalleolar ankle fracture, right, closed, initial encounter 03/20/2017  . Bimalleolar fracture of right ankle 03/20/2017  . Depression with anxiety 12/25/2011  . HTN (hypertension) 12/25/2011  . GERD (gastroesophageal reflux disease) 12/25/2011  . COPD, moderate ([place]) 12/25/2011  . Chronic constipation 12/25/2011    Scot Jun, PTA 07/11/2017, 3:15 PM  [place], [place], [address] Phone: [phone]   Fax:  [phone]  Name: Jazleen Robeck MRN: [id number] Date of Birth: 17-Apr-1946

## 2017-07-16 ENCOUNTER — Ambulatory Visit: Payer: Medicare Other | Admitting: Physical Therapy

## 2017-07-16 ENCOUNTER — Encounter: Payer: Self-pay | Admitting: Physical Therapy

## 2017-07-16 DIAGNOSIS — M25571 Pain in right ankle and joints of right foot: Secondary | ICD-10-CM

## 2017-07-16 DIAGNOSIS — R600 Localized edema: Secondary | ICD-10-CM

## 2017-07-16 DIAGNOSIS — M25671 Stiffness of right ankle, not elsewhere classified: Secondary | ICD-10-CM

## 2017-07-16 DIAGNOSIS — R262 Difficulty in walking, not elsewhere classified: Secondary | ICD-10-CM

## 2017-07-16 NOTE — Therapy (Signed)
[place], [place], [address] Phone: [phone]   Fax:  [phone]  Physical Therapy Treatment  Patient Details  Name: [name] MRN: [id number] Date of Birth: April 16, 1946 Referring Provider: Berenice Primas   Encounter Date: 07/16/2017  PT End of Session - 07/16/17 1142    Visit Number  17    Date for PT Re-Evaluation  07/17/17    PT Start Time  1100    PT Stop Time  1157    PT Time Calculation (min)  57 min    Activity Tolerance  Patient tolerated treatment well    Behavior During Therapy  [facility] for tasks assessed/performed       Past Medical History:  Diagnosis Date  . Allergy   . Ankle fracture    right  . Chronic hepatitis C ([place])   . Colon polyp   . COPD (chronic obstructive pulmonary disease) ([place])   . Diverticulitis   . Emphysema of lung ([place])   . Gallstones   . GERD (gastroesophageal reflux disease)   . Hypertension   . Kidney stones   . Osteoarthritis   . Spondylosis of cervical region without myelopathy or radiculopathy     Past Surgical History:  Procedure Laterality Date  . ABDOMINAL HYSTERECTOMY  1980  . BREAST BIOPSY  02/07/2010  . BREAST SURGERY  2011   biopsy  . CHOLECYSTECTOMY  1999  . ORIF ANKLE FRACTURE Right 03/20/2017   Procedure: OPEN REDUCTION INTERNAL FIXATION (ORIF) ANKLE FRACTURE;  Surgeon: Dorna Leitz, MD;  Location: [facility];  Service: Orthopedics;  Laterality: Right;  . TONSILLECTOMY AND ADENOIDECTOMY  1950's    There were no vitals filed for this visit.  Subjective Assessment - 07/16/17 1057    Subjective  "Good"    Currently in Pain?  Yes    Pain Score  4          [facility] PT Assessment - 07/16/17 0001      AROM   Right/Left Ankle  Right    Right Ankle Dorsiflexion  3    Right Ankle Plantar Flexion  53    Right Ankle Inversion  35    Right Ankle Eversion  25                  [facility] Adult PT Treatment/Exercise -  07/16/17 0001      Ambulation/Gait   Gait Comments  2 flight of stairs, R ankle pain with stair negotriation due to decrease DF. Pt lands hard on LLE when stepping down       Ankle Exercises: Aerobic   Elliptical  I15 R5 2 frd/2rev    Tread Mill  forward 103mh and backward 1.544m walking on treatmill       Ankle Exercises: Seated   Other Seated Ankle Exercises  Sit to stand LE on dyna disk 2x10       Ankle Exercises: Standing   Other Standing Ankle Exercises  toe and heel walking; Heel and toe raises on airex 2x15     Other Standing Ankle Exercises  SLS 4 x 10 sec      Ankle Exercises: Machines for Strengthening   Cybex Leg Press  40lb 2x15, heel raises 40lb 2x10                PT Short Term Goals - 05/21/17 1224      PT SHORT TERM GOAL #1   Title  independent with  initial HEP    Status  Achieved        PT Long Term Goals - 06/27/17 1059      PT LONG TERM GOAL #1   Title  understand and perform RICE    Status  Achieved      PT LONG TERM GOAL #2   Title  decrease pain 50%    Status  Partially Met      PT LONG TERM GOAL #3   Title  increase AROM DF to 5 degrees    Status  On-going      PT LONG TERM GOAL #4   Title  walk 750 feet with minimal deviation    Status  On-going      PT LONG TERM GOAL #5   Title  increase strength to 4/5    Status  On-going            Plan - 07/16/17 1144    Clinical Impression Statement  Pt with an small increase in R ankle ROM, Ambulates with less limp, continues to land hard on LLE when going down stairs. Some difficulty with SLS on RLE.     Rehab Potential  Good    PT Frequency  3x / week    PT Duration  8 weeks    PT Treatment/Interventions  ADLs/Self Care Home Management;Cryotherapy;Electrical Stimulation;Gait training;Balance training;Therapeutic exercise;Therapeutic activities;Functional mobility training;Stair training;Patient/family education;Manual techniques;Vasopneumatic Device    PT Next Visit Plan  add  exercises, PROM to gait DF for better gait, can use vaso for swelling       Patient will benefit from skilled therapeutic intervention in order to improve the following deficits and impairments:  Abnormal gait, Decreased activity tolerance, Decreased balance, Decreased mobility, Decreased strength, Increased edema, Impaired flexibility, Pain, Decreased range of motion, Difficulty walking  Visit Diagnosis: Pain in right ankle and joints of right foot  Stiffness of right ankle, not elsewhere classified  Difficulty in walking, not elsewhere classified  Localized edema     Problem List Patient Active Problem List   Diagnosis Date Noted  . Bimalleolar ankle fracture, right, closed, initial encounter 03/20/2017  . Bimalleolar fracture of right ankle 03/20/2017  . Depression with anxiety 12/25/2011  . HTN (hypertension) 12/25/2011  . GERD (gastroesophageal reflux disease) 12/25/2011  . COPD, moderate ([place]) 12/25/2011  . Chronic constipation 12/25/2011    Scot Jun, PTA 07/16/2017, 11:47 AM  [place], [place], [address] Phone: [phone]   Fax:  [phone]  Name: [name] MRN: [id number] Date of Birth: 1946-04-15

## 2017-07-19 ENCOUNTER — Ambulatory Visit: Payer: Medicare Other | Admitting: Physical Therapy

## 2017-07-19 ENCOUNTER — Encounter: Payer: Self-pay | Admitting: Physical Therapy

## 2017-07-19 DIAGNOSIS — M25671 Stiffness of right ankle, not elsewhere classified: Secondary | ICD-10-CM

## 2017-07-19 DIAGNOSIS — R600 Localized edema: Secondary | ICD-10-CM

## 2017-07-19 DIAGNOSIS — M25571 Pain in right ankle and joints of right foot: Secondary | ICD-10-CM | POA: Diagnosis not present

## 2017-07-19 DIAGNOSIS — R262 Difficulty in walking, not elsewhere classified: Secondary | ICD-10-CM

## 2017-07-19 NOTE — Therapy (Signed)
[place], [place], [address] Phone: [phone]   Fax:  [phone]  Physical Therapy Treatment  Patient Details  Name: [name] MRN: [id number] Date of Birth: [birth year] Referring Provider: Berenice Primas   Encounter Date: 07/19/2017  PT End of Session - 07/19/17 1057    Visit Number  18    PT Start Time  2353    PT Stop Time  1112    PT Time Calculation (min)  57 min    Activity Tolerance  Patient tolerated treatment well    Behavior During Therapy  [facility] for tasks assessed/performed       Past Medical History:  Diagnosis Date  . Allergy   . Ankle fracture    right  . Chronic hepatitis C ([place])   . Colon polyp   . COPD (chronic obstructive pulmonary disease) ([place])   . Diverticulitis   . Emphysema of lung ([place])   . Gallstones   . GERD (gastroesophageal reflux disease)   . Hypertension   . Kidney stones   . Osteoarthritis   . Spondylosis of cervical region without myelopathy or radiculopathy     Past Surgical History:  Procedure Laterality Date  . ABDOMINAL HYSTERECTOMY  1980  . BREAST BIOPSY  02/07/2010  . BREAST SURGERY  2011   biopsy  . CHOLECYSTECTOMY  1999  . ORIF ANKLE FRACTURE Right 03/20/2017   Procedure: OPEN REDUCTION INTERNAL FIXATION (ORIF) ANKLE FRACTURE;  Surgeon: Dorna Leitz, MD;  Location: [place];  Service: Orthopedics;  Laterality: Right;  . TONSILLECTOMY AND ADENOIDECTOMY  1950's    There were no vitals filed for this visit.  Subjective Assessment - 07/19/17 1019    Subjective  "I don't feel good" Pt report she thinks it is from the steroid shot yesterday    Currently in Pain?  No/denies    Pain Score  0-No pain                      [facility] Adult PT Treatment/Exercise - 07/19/17 0001      Ambulation/Gait   Gait Comments  2 flight of stairs, R ankle pain with stair negotiation due to decrease DF. Pt lands hard on LLE when  stepping down       Vasopneumatic   Number Minutes Vasopneumatic   15 minutes    Vasopnuematic Location   Ankle    Vasopneumatic Pressure  Medium    Vasopneumatic Temperature   32      Ankle Exercises: Standing   Other Standing Ankle Exercises  toe and heel walking; Heel and toe raises on airex 2x15     Other Standing Ankle Exercises  step downs 6in 2x10      Ankle Exercises: Aerobic   Elliptical  I15 R8  60fd/3rev    Tread Mill  forward 259m and backward 1.37m59mwalking on treat mill       Ankle Exercises: Machines for Strengthening   Cybex Leg Press  40lb 2x15, heel raises 40lb 2x10       Ankle Exercises: Stretches   Gastroc Stretch  4 reps;20 seconds    Slant Board Stretch  -- pro stretch      Ankle Exercises: Plyometrics   Plyometric Exercises  mini trampoline               PT Short Term Goals - 05/21/17 1224      PT SHORT TERM GOAL #1  Title  independent with initial HEP    Status  Achieved        PT Long Term Goals - 06/27/17 1059      PT LONG TERM GOAL #1   Title  understand and perform RICE    Status  Achieved      PT LONG TERM GOAL #2   Title  decrease pain 50%    Status  Partially Met      PT LONG TERM GOAL #3   Title  increase AROM DF to 5 degrees    Status  On-going      PT LONG TERM GOAL #4   Title  walk 750 feet with minimal deviation    Status  On-going      PT LONG TERM GOAL #5   Title  increase strength to 4/5    Status  On-going            Plan - 07/19/17 1058    Clinical Impression Statement  Pt gait has improved, she does reports some pain using pro stretch. Still lands hard on LLE when descending stairs due to the lack or R ankle DF.     Rehab Potential  Good    PT Frequency  3x / week    PT Treatment/Interventions  ADLs/Self Care Home Management;Cryotherapy;Electrical Stimulation;Gait training;Balance training;Therapeutic exercise;Therapeutic activities;Functional mobility training;Stair training;Patient/family  education;Manual techniques;Vasopneumatic Device    PT Next Visit Plan  add exercises, PROM to gait DF for better gait, can use vaso for swelling       Patient will benefit from skilled therapeutic intervention in order to improve the following deficits and impairments:  Abnormal gait, Decreased activity tolerance, Decreased balance, Decreased mobility, Decreased strength, Increased edema, Impaired flexibility, Pain, Decreased range of motion, Difficulty walking  Visit Diagnosis: Pain in right ankle and joints of right foot  Stiffness of right ankle, not elsewhere classified  Difficulty in walking, not elsewhere classified  Localized edema     Problem List Patient Active Problem List   Diagnosis Date Noted  . Bimalleolar ankle fracture, right, closed, initial encounter 03/20/2017  . Bimalleolar fracture of right ankle 03/20/2017  . Depression with anxiety 12/25/2011  . HTN (hypertension) 12/25/2011  . GERD (gastroesophageal reflux disease) 12/25/2011  . COPD, moderate ([place]) 12/25/2011  . Chronic constipation 12/25/2011    Scot Jun, PTA 07/19/2017, 11:01 AM  [place], [place], [address] Phone: [phone]   Fax:  [phone]  Name: [name] MRN: [id number] Date of Birth: 1945/10/30

## 2017-07-22 ENCOUNTER — Other Ambulatory Visit: Payer: Self-pay | Admitting: Family Medicine

## 2017-07-22 DIAGNOSIS — E2839 Other primary ovarian failure: Secondary | ICD-10-CM

## 2017-07-22 DIAGNOSIS — Z9189 Other specified personal risk factors, not elsewhere classified: Secondary | ICD-10-CM

## 2017-07-23 ENCOUNTER — Encounter: Payer: Self-pay | Admitting: Physical Therapy

## 2017-07-23 ENCOUNTER — Ambulatory Visit: Payer: Medicare Other | Admitting: Physical Therapy

## 2017-07-23 DIAGNOSIS — R600 Localized edema: Secondary | ICD-10-CM

## 2017-07-23 DIAGNOSIS — M25671 Stiffness of right ankle, not elsewhere classified: Secondary | ICD-10-CM

## 2017-07-23 DIAGNOSIS — R262 Difficulty in walking, not elsewhere classified: Secondary | ICD-10-CM

## 2017-07-23 DIAGNOSIS — M25571 Pain in right ankle and joints of right foot: Secondary | ICD-10-CM

## 2017-07-23 NOTE — Therapy (Signed)
[place], [place], [address] Phone: [phone]   Fax:  [phone]  Physical Therapy Treatment  Patient Details  Name: [name] MRN: [id number] Date of Birth: Dec 25, 1945 Referring Provider: Berenice Primas   Encounter Date: 07/23/2017  PT End of Session - 07/23/17 1009    Visit Number  19    Date for PT Re-Evaluation  07/17/17    PT Start Time  0930    PT Stop Time  1025    PT Time Calculation (min)  55 min    Activity Tolerance  Patient tolerated treatment well;Patient limited by fatigue    Behavior During Therapy  [facility] for tasks assessed/performed       Past Medical History:  Diagnosis Date  . Allergy   . Ankle fracture    right  . Chronic hepatitis C ([place])   . Colon polyp   . COPD (chronic obstructive pulmonary disease) ([place])   . Diverticulitis   . Emphysema of lung ([place])   . Gallstones   . GERD (gastroesophageal reflux disease)   . Hypertension   . Kidney stones   . Osteoarthritis   . Spondylosis of cervical region without myelopathy or radiculopathy     Past Surgical History:  Procedure Laterality Date  . ABDOMINAL HYSTERECTOMY  1980  . BREAST BIOPSY  02/07/2010  . BREAST SURGERY  2011   biopsy  . CHOLECYSTECTOMY  1999  . ORIF ANKLE FRACTURE Right 03/20/2017   Procedure: OPEN REDUCTION INTERNAL FIXATION (ORIF) ANKLE FRACTURE;  Surgeon: Dorna Leitz, MD;  Location: [place];  Service: Orthopedics;  Laterality: Right;  . TONSILLECTOMY AND ADENOIDECTOMY  1950's    There were no vitals filed for this visit.  Subjective Assessment - 07/23/17 0934    Subjective  "I am fine"    Currently in Pain?  Yes    Pain Score  2     Pain Location  Ankle    Pain Orientation  Left;Lateral                      [facility] Adult PT Treatment/Exercise - 07/23/17 0001      Ambulation/Gait   Gait Comments  2 flight of stairs, R ankle pain with stair negotriation due  to decrease DF. Pt lands hard on LLE when stepping down       Vasopneumatic   Number Minutes Vasopneumatic   15 minutes    Vasopnuematic Location   Ankle    Vasopneumatic Pressure  Medium    Vasopneumatic Temperature   32      Ankle Exercises: Aerobic   Elliptical  I15 R8  63fd/3rev      Ankle Exercises: Machines for Strengthening   Cybex Leg Press  20lb RLE on dyna disk 2x10, RLE heel raises 20lb 2x10       Ankle Exercises: Stretches   Gastroc Stretch  4 reps;20 seconds pro stretch      Ankle Exercises: Plyometrics   Plyometric Exercises  mini trampoline               PT Short Term Goals - 05/21/17 1224      PT SHORT TERM GOAL #1   Title  independent with initial HEP    Status  Achieved        PT Long Term Goals - 06/27/17 1059      PT LONG TERM GOAL #1   Title  understand and perform  RICE    Status  Achieved      PT LONG TERM GOAL #2   Title  decrease pain 50%    Status  Partially Met      PT LONG TERM GOAL #3   Title  increase AROM DF to 5 degrees    Status  On-going      PT LONG TERM GOAL #4   Title  walk 750 feet with minimal deviation    Status  On-going      PT LONG TERM GOAL #5   Title  increase strength to 4/5    Status  On-going            Plan - 07/23/17 1010    Clinical Impression Statement  Increase fatigue with today's activities. She continues to land hard on LLE when going down stairs due to decrease DF of R ankle. She thinks the plate is limiting her ankle motion.     PT Frequency  1x / week Spoke to lead PT will decrease to once a week to conserve visits, pt may return after getting plate removed.    PT Treatment/Interventions  ADLs/Self Care Home Management;Cryotherapy;Electrical Stimulation;Gait training;Balance training;Therapeutic exercise;Therapeutic activities;Functional mobility training;Stair training;Patient/family education;Manual techniques;Vasopneumatic Device    PT Next Visit Plan  add exercises, PROM to gait DF  for better gait, can use vaso for swelling       Patient will benefit from skilled therapeutic intervention in order to improve the following deficits and impairments:  Abnormal gait, Decreased activity tolerance, Decreased balance, Decreased mobility, Decreased strength, Increased edema, Impaired flexibility, Pain, Decreased range of motion, Difficulty walking  Visit Diagnosis: Stiffness of right ankle, not elsewhere classified  Difficulty in walking, not elsewhere classified  Localized edema  Pain in right ankle and joints of right foot     Problem List Patient Active Problem List   Diagnosis Date Noted  . Bimalleolar ankle fracture, right, closed, initial encounter 03/20/2017  . Bimalleolar fracture of right ankle 03/20/2017  . Depression with anxiety 12/25/2011  . HTN (hypertension) 12/25/2011  . GERD (gastroesophageal reflux disease) 12/25/2011  . COPD, moderate ([place]) 12/25/2011  . Chronic constipation 12/25/2011    Scot Jun, PTA 07/23/2017, 10:12 AM  [place], [place], [address] Phone: [phone]   Fax:  [phone]  Name: [name] MRN: [id number] Date of Birth: Nov 13, 1945

## 2017-07-26 ENCOUNTER — Ambulatory Visit: Payer: Medicare Other | Admitting: Physical Therapy

## 2017-07-30 ENCOUNTER — Ambulatory Visit: Payer: Medicare Other | Admitting: Physical Therapy

## 2017-07-30 ENCOUNTER — Encounter: Payer: Self-pay | Admitting: Physical Therapy

## 2017-07-30 DIAGNOSIS — M25671 Stiffness of right ankle, not elsewhere classified: Secondary | ICD-10-CM

## 2017-07-30 DIAGNOSIS — R262 Difficulty in walking, not elsewhere classified: Secondary | ICD-10-CM

## 2017-07-30 DIAGNOSIS — M25571 Pain in right ankle and joints of right foot: Secondary | ICD-10-CM | POA: Diagnosis not present

## 2017-07-30 DIAGNOSIS — R600 Localized edema: Secondary | ICD-10-CM

## 2017-07-30 NOTE — Addendum Note (Signed)
Addended by: Sumner Boast on: 07/30/2017 05:43 PM   Modules accepted: Orders

## 2017-07-30 NOTE — Therapy (Signed)
[place], [place], [address] Phone: [phone]   Fax:  [phone]  Physical Therapy Treatment  Patient Details  Name: [name] MRN: [id number] Date of Birth: 03/05/1946 Referring Provider: Berenice Primas   Encounter Date: 07/30/2017  PT End of Session - 07/30/17 1055    Visit Number  20    Date for PT Re-Evaluation  07/17/17    PT Start Time  8891    PT Stop Time  1112    PT Time Calculation (min)  57 min       Past Medical History:  Diagnosis Date  . Allergy   . Ankle fracture    right  . Chronic hepatitis C ([place])   . Colon polyp   . COPD (chronic obstructive pulmonary disease) ([place])   . Diverticulitis   . Emphysema of lung ([place])   . Gallstones   . GERD (gastroesophageal reflux disease)   . Hypertension   . Kidney stones   . Osteoarthritis   . Spondylosis of cervical region without myelopathy or radiculopathy     Past Surgical History:  Procedure Laterality Date  . ABDOMINAL HYSTERECTOMY  1980  . BREAST BIOPSY  02/07/2010  . BREAST SURGERY  2011   biopsy  . CHOLECYSTECTOMY  1999  . ORIF ANKLE FRACTURE Right 03/20/2017   Procedure: OPEN REDUCTION INTERNAL FIXATION (ORIF) ANKLE FRACTURE;  Surgeon: Dorna Leitz, MD;  Location: [place];  Service: Orthopedics;  Laterality: Right;  . TONSILLECTOMY AND ADENOIDECTOMY  1950's    There were no vitals filed for this visit.  Subjective Assessment - 07/30/17 1019    Subjective  "My steroid shod did not work, It is my back and my led"    Currently in Pain?  No/denies    Pain Score  0-No pain                      [facility] Adult PT Treatment/Exercise - 07/30/17 0001      Ambulation/Gait   Gait Comments  2 flight of stairs, R ankle pain with stair negotriation due to decrease DF. Pt lands hard on LLE when stepping down       Vasopneumatic   Number Minutes Vasopneumatic   15 minutes    Vasopnuematic  Location   Ankle    Vasopneumatic Pressure  Medium    Vasopneumatic Temperature   32      Ankle Exercises: Aerobic   Elliptical  I15 R8  58fd/3rev      Ankle Exercises: Machines for Strengthening   Cybex Leg Press  30lb 3x10; RLE heel raises 30lb 2x10       Ankle Exercises: Standing   Other Standing Ankle Exercises  Squats x10; Split squats with UE support x100 RLE back     Other Standing Ankle Exercises  heel raises black bar RLE 2x10; Resisted gait 4 way 40lb x3 each way                PT Short Term Goals - 05/21/17 1224      PT SHORT TERM GOAL #1   Title  independent with initial HEP    Status  Achieved        PT Long Term Goals - 07/30/17 1057      PT LONG TERM GOAL #1   Title  understand and perform RICE    Status  Achieved      PT LONG TERM GOAL #  2   Title  decrease pain 50%    Status  Partially Met      PT LONG TERM GOAL #3   Title  increase AROM DF to 5 degrees    Status  On-going      PT LONG TERM GOAL #4   Title  walk 750 feet with minimal deviation    Status  Partially Met            Plan - 07/30/17 1055    Clinical Impression Statement  Pt continues to do well, R ankle ROM does make pt compensate for some activities. Continues to land hard on LLE when going  down steps. No reports of pain with today's exercises.     Rehab Potential  Good    PT Frequency  1x / week    PT Duration  8 weeks    PT Treatment/Interventions  ADLs/Self Care Home Management;Cryotherapy;Electrical Stimulation;Gait training;Balance training;Therapeutic exercise;Therapeutic activities;Functional mobility training;Stair training;Patient/family education;Manual techniques;Vasopneumatic Device    PT Next Visit Plan  add exercises, PROM to gait DF for better gait, can use vaso for swelling       Patient will benefit from skilled therapeutic intervention in order to improve the following deficits and impairments:  Abnormal gait, Decreased activity tolerance, Decreased  balance, Decreased mobility, Decreased strength, Increased edema, Impaired flexibility, Pain, Decreased range of motion, Difficulty walking  Visit Diagnosis: Difficulty in walking, not elsewhere classified  Stiffness of right ankle, not elsewhere classified  Localized edema  Pain in right ankle and joints of right foot     Problem List Patient Active Problem List   Diagnosis Date Noted  . Bimalleolar ankle fracture, right, closed, initial encounter 03/20/2017  . Bimalleolar fracture of right ankle 03/20/2017  . Depression with anxiety 12/25/2011  . HTN (hypertension) 12/25/2011  . GERD (gastroesophageal reflux disease) 12/25/2011  . COPD, moderate ([place]) 12/25/2011  . Chronic constipation 12/25/2011    Scot Jun, PTA 07/30/2017, 10:59 AM  [place], [place], [address] Phone: [phone]   Fax:  [phone]  Name: [name] MRN: [id number] Date of Birth: 03-11-1946

## 2017-08-01 ENCOUNTER — Ambulatory Visit: Payer: Medicare Other | Admitting: Physical Therapy

## 2017-08-07 ENCOUNTER — Encounter: Payer: Self-pay | Admitting: Physical Therapy

## 2017-08-07 ENCOUNTER — Ambulatory Visit: Payer: Medicare Other | Attending: Orthopedic Surgery | Admitting: Physical Therapy

## 2017-08-07 DIAGNOSIS — M25571 Pain in right ankle and joints of right foot: Secondary | ICD-10-CM | POA: Diagnosis present

## 2017-08-07 DIAGNOSIS — R262 Difficulty in walking, not elsewhere classified: Secondary | ICD-10-CM

## 2017-08-07 DIAGNOSIS — M25671 Stiffness of right ankle, not elsewhere classified: Secondary | ICD-10-CM | POA: Insufficient documentation

## 2017-08-07 DIAGNOSIS — R600 Localized edema: Secondary | ICD-10-CM | POA: Diagnosis present

## 2017-08-07 NOTE — Therapy (Signed)
[place], [place], [address] Phone: [phone]   Fax:  [phone]  Physical Therapy Treatment  Patient Details  Name: [name] MRN: [id number] Date of Birth: [birth year] Referring Provider: Berenice Primas   Encounter Date: 08/07/2017  PT End of Session - 08/07/17 1058    Visit Number  21    Date for PT Re-Evaluation  08/17/17    PT Start Time  9390    PT Stop Time  1112    PT Time Calculation (min)  57 min    Activity Tolerance  Patient tolerated treatment well    Behavior During Therapy  [facility] for tasks assessed/performed       Past Medical History:  Diagnosis Date  . Allergy   . Ankle fracture    right  . Chronic hepatitis C ([place])   . Colon polyp   . COPD (chronic obstructive pulmonary disease) ([place])   . Diverticulitis   . Emphysema of lung ([place])   . Gallstones   . GERD (gastroesophageal reflux disease)   . Hypertension   . Kidney stones   . Osteoarthritis   . Spondylosis of cervical region without myelopathy or radiculopathy     Past Surgical History:  Procedure Laterality Date  . ABDOMINAL HYSTERECTOMY  1980  . BREAST BIOPSY  02/07/2010  . BREAST SURGERY  2011   biopsy  . CHOLECYSTECTOMY  1999  . ORIF ANKLE FRACTURE Right 03/20/2017   Procedure: OPEN REDUCTION INTERNAL FIXATION (ORIF) ANKLE FRACTURE;  Surgeon: Dorna Leitz, MD;  Location: [place];  Service: Orthopedics;  Laterality: Right;  . TONSILLECTOMY AND ADENOIDECTOMY  1950's    There were no vitals filed for this visit.  Subjective Assessment - 08/07/17 1013    Subjective  "Im fine, I walked 5 miles the other day, I could feel it"    Currently in Pain?  No/denies    Pain Score  0-No pain         [facility] PT Assessment - 08/07/17 0001      AROM   Right/Left Ankle  Right    Right Ankle Dorsiflexion  -5    Right Ankle Plantar Flexion  60    Right Ankle Inversion  30    Right Ankle Eversion  23                   [facility] Adult PT Treatment/Exercise - 08/07/17 0001      Ambulation/Gait   Gait Comments  1 flight of stairs, R ankle pain with stair negotriation due to decrease DF. Pt lands hard on LLE when stepping down, back down outside up hill      Vasopneumatic   Number Minutes Vasopneumatic   15 minutes    Vasopnuematic Location   Ankle    Vasopneumatic Pressure  Medium    Vasopneumatic Temperature   32      Ankle Exercises: Aerobic   Stationary Bike  L2 x3 min    Elliptical  I15 R8  34fd/3rev      Ankle Exercises: Machines for Strengthening   Cybex Leg Press  40lb 2x15; heel raises 40lb 2x15      Ankle Exercises: Standing   Other Standing Ankle Exercises  heel and toe walking    Other Standing Ankle Exercises  heel raises black bar RLE 2x10; Resisted gait 4 way 40lb x3 each way       Ankle Exercises: Plyometrics   Plyometric Exercises  mini trampoline               PT Short Term Goals - 05/21/17 1224      PT SHORT TERM GOAL #1   Title  independent with initial HEP    Status  Achieved        PT Long Term Goals - 08/07/17 1101      PT LONG TERM GOAL #2   Title  decrease pain 50%    Status  Partially Met      PT LONG TERM GOAL #3   Title  increase AROM DF to 5 degrees    Status  On-going      PT LONG TERM GOAL #4   Title  walk 750 feet with minimal deviation    Status  Partially Met            Plan - 08/07/17 1058    Clinical Impression Statement  No improvement with R ankle AROM, she reports no functional limitations. R ankle lacks DF and she has a mild limp when walking. Continues to land hard on LLE when going down stairs due to decrease R ankle DF.     Rehab Potential  Good    PT Frequency  1x / week    PT Duration  8 weeks    PT Treatment/Interventions  ADLs/Self Care Home Management;Cryotherapy;Electrical Stimulation;Gait training;Balance training;Therapeutic exercise;Therapeutic activities;Functional mobility  training;Stair training;Patient/family education;Manual techniques;Vasopneumatic Device    PT Next Visit Plan  add exercises, PROM to gait DF for better gait, can use vaso for swelling       Patient will benefit from skilled therapeutic intervention in order to improve the following deficits and impairments:  Abnormal gait, Decreased activity tolerance, Decreased balance, Decreased mobility, Decreased strength, Increased edema, Impaired flexibility, Pain, Decreased range of motion, Difficulty walking  Visit Diagnosis: Difficulty in walking, not elsewhere classified  Stiffness of right ankle, not elsewhere classified  Localized edema  Pain in right ankle and joints of right foot     Problem List Patient Active Problem List   Diagnosis Date Noted  . Bimalleolar ankle fracture, right, closed, initial encounter 03/20/2017  . Bimalleolar fracture of right ankle 03/20/2017  . Depression with anxiety 12/25/2011  . HTN (hypertension) 12/25/2011  . GERD (gastroesophageal reflux disease) 12/25/2011  . COPD, moderate ([place]) 12/25/2011  . Chronic constipation 12/25/2011    Scot Jun, PTA 08/07/2017, 11:02 AM  [place], [place], [address] Phone: [phone]   Fax:  [phone]  Name: [name] MRN: [id number] Date of Birth: [birth year]

## 2017-08-12 ENCOUNTER — Ambulatory Visit
Admission: RE | Admit: 2017-08-12 | Discharge: 2017-08-12 | Disposition: A | Discharge status: Home / Self Care | Payer: Medicare Other | Source: Ambulatory Visit | Attending: Family Medicine | Admitting: Family Medicine

## 2017-08-12 ENCOUNTER — Ambulatory Visit: Payer: Medicare Other

## 2017-08-12 DIAGNOSIS — Z1231 Encounter for screening mammogram for malignant neoplasm of breast: Secondary | ICD-10-CM

## 2017-08-12 DIAGNOSIS — E2839 Other primary ovarian failure: Secondary | ICD-10-CM

## 2017-08-12 DIAGNOSIS — Z9189 Other specified personal risk factors, not elsewhere classified: Secondary | ICD-10-CM

## 2017-08-14 ENCOUNTER — Ambulatory Visit: Payer: Medicare Other | Admitting: Physical Therapy

## 2017-08-14 ENCOUNTER — Encounter: Payer: Self-pay | Admitting: Physical Therapy

## 2017-08-14 DIAGNOSIS — R262 Difficulty in walking, not elsewhere classified: Secondary | ICD-10-CM

## 2017-08-14 DIAGNOSIS — R600 Localized edema: Secondary | ICD-10-CM

## 2017-08-14 DIAGNOSIS — M25571 Pain in right ankle and joints of right foot: Secondary | ICD-10-CM

## 2017-08-14 DIAGNOSIS — M25671 Stiffness of right ankle, not elsewhere classified: Secondary | ICD-10-CM

## 2017-08-14 NOTE — Therapy (Signed)
[place], [place], [address] Phone: [phone]   Fax:  [phone]  Physical Therapy Treatment  Patient Details  Name: [name] MRN: [id number] Date of Birth: 02-08-1946 Referring Provider: Berenice Primas   Encounter Date: 08/14/2017  PT End of Session - 08/14/17 1057    Visit Number  22    Date for PT Re-Evaluation  08/17/17    PT Start Time  4481    PT Stop Time  1111    PT Time Calculation (min)  56 min    Activity Tolerance  Patient tolerated treatment well    Behavior During Therapy  [facility] for tasks assessed/performed       Past Medical History:  Diagnosis Date  . Allergy   . Ankle fracture    right  . Chronic hepatitis C ([place])   . Colon polyp   . COPD (chronic obstructive pulmonary disease) ([place])   . Diverticulitis   . Emphysema of lung ([place])   . Gallstones   . GERD (gastroesophageal reflux disease)   . Hypertension   . Kidney stones   . Osteoarthritis   . Spondylosis of cervical region without myelopathy or radiculopathy     Past Surgical History:  Procedure Laterality Date  . ABDOMINAL HYSTERECTOMY  1980  . BREAST BIOPSY  02/07/2010  . BREAST SURGERY  2011   biopsy  . CHOLECYSTECTOMY  1999  . ORIF ANKLE FRACTURE Right 03/20/2017   Procedure: OPEN REDUCTION INTERNAL FIXATION (ORIF) ANKLE FRACTURE;  Surgeon: Dorna Leitz, MD;  Location: [facility];  Service: Orthopedics;  Laterality: Right;  . TONSILLECTOMY AND ADENOIDECTOMY  1950's    There were no vitals filed for this visit.  Subjective Assessment - 08/14/17 1016    Subjective  "Ok" "just stiffness"    Currently in Pain?  No/denies    Pain Score  0-No pain                      [facility] Adult PT Treatment/Exercise - 08/14/17 0001      Ambulation/Gait   Gait Comments  1 flight of stairs,  Pt lands hard on LLE when stepping down, due to decrease R ankleDF, back down outside up hill      Vasopneumatic   Number Minutes Vasopneumatic   15 minutes    Vasopnuematic Location   Ankle    Vasopneumatic Pressure  Medium    Vasopneumatic Temperature   32      Ankle Exercises: Aerobic   Stationary Bike  L2 x3 min    Elliptical  I15 R8  63fd/3rev      Ankle Exercises: Machines for Strengthening   Cybex Leg Press  40lb 2x15; heel raises 40lb 2x15      Ankle Exercises: Standing   Other Standing Ankle Exercises  heel raises black bar RLE 2x10; Resisted gait 4 way 40lb x3 each way                PT Short Term Goals - 05/21/17 1224      PT SHORT TERM GOAL #1   Title  independent with initial HEP    Status  Achieved        PT Long Term Goals - 08/07/17 1101      PT LONG TERM GOAL #2   Title  decrease pain 50%    Status  Partially Met      PT LONG TERM GOAL #3  Title  increase AROM DF to 5 degrees    Status  On-going      PT LONG TERM GOAL #4   Title  walk 750 feet with minimal deviation    Status  Partially Met            Plan - 08/14/17 1057    Clinical Impression Statement  Pt appears to have reaches a therapeutic plateau. She reports no functional limitations at home. Pt is able to do all activities but has a mild limp due to decrease R ankle DF.     Rehab Potential  Good    PT Frequency  1x / week    PT Duration  8 weeks    PT Treatment/Interventions  ADLs/Self Care Home Management;Cryotherapy;Electrical Stimulation;Gait training;Balance training;Therapeutic exercise;Therapeutic activities;Functional mobility training;Stair training;Patient/family education;Manual techniques;Vasopneumatic Device    PT Next Visit Plan  Pt goes to MD next week        Patient will benefit from skilled therapeutic intervention in order to improve the following deficits and impairments:  Abnormal gait, Decreased activity tolerance, Decreased balance, Decreased mobility, Decreased strength, Increased edema, Impaired flexibility, Pain, Decreased range of motion,  Difficulty walking  Visit Diagnosis: Difficulty in walking, not elsewhere classified  Localized edema  Stiffness of right ankle, not elsewhere classified  Pain in right ankle and joints of right foot     Problem List Patient Active Problem List   Diagnosis Date Noted  . Bimalleolar ankle fracture, right, closed, initial encounter 03/20/2017  . Bimalleolar fracture of right ankle 03/20/2017  . Depression with anxiety 12/25/2011  . HTN (hypertension) 12/25/2011  . GERD (gastroesophageal reflux disease) 12/25/2011  . COPD, moderate ([place]) 12/25/2011  . Chronic constipation 12/25/2011    Scot Jun, PTA 08/14/2017, 10:59 AM  [place], [place], [address] Phone: [phone]   Fax:  [phone]  Name: Damiyah Ditmars MRN: [id number] Date of Birth: February 14, 1946

## 2017-08-21 ENCOUNTER — Ambulatory Visit: Payer: Medicare Other | Admitting: Physical Therapy

## 2017-08-21 DIAGNOSIS — M25571 Pain in right ankle and joints of right foot: Secondary | ICD-10-CM

## 2017-08-21 DIAGNOSIS — R600 Localized edema: Secondary | ICD-10-CM

## 2017-08-21 DIAGNOSIS — R262 Difficulty in walking, not elsewhere classified: Secondary | ICD-10-CM

## 2017-08-21 DIAGNOSIS — M25671 Stiffness of right ankle, not elsewhere classified: Secondary | ICD-10-CM

## 2017-08-21 NOTE — Therapy (Signed)
[place], [place], [address] Phone: [phone]   Fax:  [phone]  Physical Therapy Treatment  Patient Details  Name: [name] MRN: [id number] Date of Birth: September 11, 1945 Referring Provider: Berenice Primas   Encounter Date: 08/21/2017  PT End of Session - 08/21/17 1140    Visit Number  23    Date for PT Re-Evaluation  08/17/17    PT Start Time  1100    PT Stop Time  1156    PT Time Calculation (min)  56 min    Activity Tolerance  Patient tolerated treatment well    Behavior During Therapy  [facility] for tasks assessed/performed       Past Medical History:  Diagnosis Date  . Allergy   . Ankle fracture    right  . Chronic hepatitis C ([place])   . Colon polyp   . COPD (chronic obstructive pulmonary disease) ([place])   . Diverticulitis   . Emphysema of lung ([place])   . Gallstones   . GERD (gastroesophageal reflux disease)   . Hypertension   . Kidney stones   . Osteoarthritis   . Spondylosis of cervical region without myelopathy or radiculopathy     Past Surgical History:  Procedure Laterality Date  . ABDOMINAL HYSTERECTOMY  1980  . BREAST BIOPSY  02/07/2010  . BREAST SURGERY  2011   biopsy  . CHOLECYSTECTOMY  1999  . ORIF ANKLE FRACTURE Right 03/20/2017   Procedure: OPEN REDUCTION INTERNAL FIXATION (ORIF) ANKLE FRACTURE;  Surgeon: Dorna Leitz, MD;  Location: [facility];  Service: Orthopedics;  Laterality: Right;  . TONSILLECTOMY AND ADENOIDECTOMY  1950's    There were no vitals filed for this visit.  Subjective Assessment - 08/21/17 1104    Subjective  "Fine, I go the doctor tomorrow"    Currently in Pain?  No/denies    Pain Score  0-No pain         [facility] PT Assessment - 08/21/17 0001      AROM   Right Ankle Dorsiflexion  0    Right Ankle Plantar Flexion  60    Right Ankle Inversion  30    Right Ankle Eversion  23                  [facility] Adult PT  Treatment/Exercise - 08/21/17 0001      Ambulation/Gait   Gait Comments  2 flight of stairs,  Pt lands hard on LLE when stepping down due to decrease R ankleDF      Vasopneumatic   Number Minutes Vasopneumatic   15 minutes    Vasopnuematic Location   Ankle    Vasopneumatic Pressure  Medium    Vasopneumatic Temperature   32      Ankle Exercises: Aerobic   Stationary Bike  L2 x5 min    Tread Mill  forward 14mh and backward 2 mph walking on treatmill       Ankle Exercises: Standing   Other Standing Ankle Exercises  heel and toe walking    Other Standing Ankle Exercises  4in step downs tryinh yo keel R heel flat 2x10       Ankle Exercises: Plyometrics   Plyometric Exercises  mini trampoline      Ankle Exercises: Machines for Strengthening   Cybex Leg Press  40lb 2x15; heel raises 40lb 2x15               PT Short Term  Goals - 05/21/17 1224      PT SHORT TERM GOAL #1   Title  independent with initial HEP    Status  Achieved        PT Long Term Goals - 08/21/17 1142      PT LONG TERM GOAL #1   Title  understand and perform RICE    Status  Achieved      PT LONG TERM GOAL #2   Title  decrease pain 50%    Status  Achieved      PT LONG TERM GOAL #3   Title  increase AROM DF to 5 degrees    Status  On-going      PT LONG TERM GOAL #4   Title  walk 750 feet with minimal deviation    Status  Achieved      PT LONG TERM GOAL #5   Title  increase strength to 4/5    Status  Partially Met            Plan - 08/21/17 1141    Clinical Impression Statement  Pt seems to have reached a therapeutic plateau. Again no functional limitations just decrease R ankle dorsi flexion that cause pt to compensate with some activities. Little elevation achieved with toe walking.    Rehab Potential  Good    PT Frequency  1x / week    PT Duration  8 weeks    PT Treatment/Interventions  ADLs/Self Care Home Management;Cryotherapy;Electrical Stimulation;Gait training;Balance  training;Therapeutic exercise;Therapeutic activities;Functional mobility training;Stair training;Patient/family education;Manual techniques;Vasopneumatic Device    PT Next Visit Plan  Pt goes to MD tomorrow       Patient will benefit from skilled therapeutic intervention in order to improve the following deficits and impairments:  Abnormal gait, Decreased activity tolerance, Decreased balance, Decreased mobility, Decreased strength, Increased edema, Impaired flexibility, Pain, Decreased range of motion, Difficulty walking  Visit Diagnosis: Stiffness of right ankle, not elsewhere classified  Pain in right ankle and joints of right foot  Localized edema  Difficulty in walking, not elsewhere classified     Problem List Patient Active Problem List   Diagnosis Date Noted  . Bimalleolar ankle fracture, right, closed, initial encounter 03/20/2017  . Bimalleolar fracture of right ankle 03/20/2017  . Depression with anxiety 12/25/2011  . HTN (hypertension) 12/25/2011  . GERD (gastroesophageal reflux disease) 12/25/2011  . COPD, moderate ([place]) 12/25/2011  . Chronic constipation 12/25/2011    Scot Jun, PTA 08/21/2017, 11:43 AM  [place] [address] [place] [address] [place], [place], [address] Phone: [phone]   Fax:  [phone]  Name: [name] MRN: [id number] Date of Birth: 1946/01/18

## 2017-08-27 ENCOUNTER — Encounter: Payer: Self-pay | Admitting: Physical Therapy

## 2017-08-27 ENCOUNTER — Ambulatory Visit: Payer: Medicare Other | Admitting: Physical Therapy

## 2017-08-27 DIAGNOSIS — M25571 Pain in right ankle and joints of right foot: Secondary | ICD-10-CM

## 2017-08-27 DIAGNOSIS — R262 Difficulty in walking, not elsewhere classified: Secondary | ICD-10-CM

## 2017-08-27 DIAGNOSIS — M25671 Stiffness of right ankle, not elsewhere classified: Secondary | ICD-10-CM

## 2017-08-27 DIAGNOSIS — R600 Localized edema: Secondary | ICD-10-CM

## 2017-08-27 NOTE — Therapy (Signed)
[place], [place], [address] Phone: [phone]   Fax:  [phone]  Physical Therapy Treatment  Patient Details  Name: [name] MRN: [id number] Date of Birth: May 08, 1946 Referring Provider: Berenice Primas   Encounter Date: 08/27/2017  PT End of Session - 08/27/17 1059    Visit Number  24    Date for PT Re-Evaluation  08/17/17    PT Start Time  0175    PT Stop Time  1114    PT Time Calculation (min)  59 min    Activity Tolerance  Patient tolerated treatment well    Behavior During Therapy  [facility] for tasks assessed/performed       Past Medical History:  Diagnosis Date  . Allergy   . Ankle fracture    right  . Chronic hepatitis C ([place])   . Colon polyp   . COPD (chronic obstructive pulmonary disease) ([place])   . Diverticulitis   . Emphysema of lung ([place])   . Gallstones   . GERD (gastroesophageal reflux disease)   . Hypertension   . Kidney stones   . Osteoarthritis   . Spondylosis of cervical region without myelopathy or radiculopathy     Past Surgical History:  Procedure Laterality Date  . ABDOMINAL HYSTERECTOMY  1980  . BREAST BIOPSY  02/07/2010  . BREAST SURGERY  2011   biopsy  . CHOLECYSTECTOMY  1999  . ORIF ANKLE FRACTURE Right 03/20/2017   Procedure: OPEN REDUCTION INTERNAL FIXATION (ORIF) ANKLE FRACTURE;  Surgeon: Dorna Leitz, MD;  Location: [facility];  Service: Orthopedics;  Laterality: Right;  . TONSILLECTOMY AND ADENOIDECTOMY  1950's    There were no vitals filed for this visit.  Subjective Assessment - 08/27/17 1018    Subjective  "Fine"    Currently in Pain?  No/denies    Pain Score  0-No pain                      [facility] Adult PT Treatment/Exercise - 08/27/17 0001      Ambulation/Gait   Gait Comments  2 flight of stairs,  Pt lands hard on LLE when stepping down due to decrease R ankleDF      Vasopneumatic   Number Minutes Vasopneumatic    15 minutes    Vasopnuematic Location   Ankle    Vasopneumatic Pressure  Medium    Vasopneumatic Temperature   32      Ankle Exercises: Aerobic   Elliptical  I15 R8  92fd/3rev    Tread Mill   backward 1 mph walking on treatmill x2 min      Ankle Exercises: Standing   Other Standing Ankle Exercises  heel and toe walking    Other Standing Ankle Exercises  4in step downs trying to keel R heel flat 2x10       Ankle Exercises: Plyometrics   Plyometric Exercises  mini trampoline      Ankle Exercises: Machines for Strengthening   Cybex Leg Press  40lb 2x15; heel raises 40lb 3x15               PT Short Term Goals - 05/21/17 1224      PT SHORT TERM GOAL #1   Title  independent with initial HEP    Status  Achieved        PT Long Term Goals - 08/27/17 1059      PT LONG TERM GOAL #3  Title  increase AROM DF to 5 degrees    Status  On-going      PT LONG TERM GOAL #4   Title  walk 750 feet with minimal deviation    Status  Achieved      PT LONG TERM GOAL #5   Status  Achieved            Plan - 08/27/17 1100    Clinical Impression Statement  Most goals met, pt reports no functional limitationns only decrease R ankle DF.     Rehab Potential  Good    PT Frequency  1x / week    PT Duration  8 weeks    PT Treatment/Interventions  ADLs/Self Care Home Management;Cryotherapy;Electrical Stimulation;Gait training;Balance training;Therapeutic exercise;Therapeutic activities;Functional mobility training;Stair training;Patient/family education;Manual techniques;Vasopneumatic Device    PT Next Visit Plan  D/C PT       Patient will benefit from skilled therapeutic intervention in order to improve the following deficits and impairments:  Abnormal gait, Decreased activity tolerance, Decreased balance, Decreased mobility, Decreased strength, Increased edema, Impaired flexibility, Pain, Decreased range of motion, Difficulty walking  Visit Diagnosis: Stiffness of right ankle,  not elsewhere classified  Pain in right ankle and joints of right foot  Difficulty in walking, not elsewhere classified  Localized edema     Problem List Patient Active Problem List   Diagnosis Date Noted  . Bimalleolar ankle fracture, right, closed, initial encounter 03/20/2017  . Bimalleolar fracture of right ankle 03/20/2017  . Depression with anxiety 12/25/2011  . HTN (hypertension) 12/25/2011  . GERD (gastroesophageal reflux disease) 12/25/2011  . COPD, moderate ([place]) 12/25/2011  . Chronic constipation 12/25/2011   PHYSICAL THERAPY DISCHARGE SUMMARY  Visits from Start of Care: 24 Plan: Patient agrees to discharge.  Patient goals were met. Patient is being discharged due to being pleased with the current functional level.  ?????     Scot Jun, PTA 08/27/2017, 11:01 AM  [place] [address] [address] [place], [place], [address] Phone: [phone]   Fax:  [phone]  Name: [name] MRN: [id number] Date of Birth: 29-Jan-1946

## 2017-08-29 ENCOUNTER — Other Ambulatory Visit: Payer: Self-pay | Admitting: Orthopedic Surgery

## 2017-09-19 ENCOUNTER — Other Ambulatory Visit (HOSPITAL_COMMUNITY): Payer: Self-pay | Admitting: Family Medicine

## 2017-09-19 DIAGNOSIS — Z122 Encounter for screening for malignant neoplasm of respiratory organs: Secondary | ICD-10-CM

## 2017-09-23 ENCOUNTER — Other Ambulatory Visit: Payer: Self-pay

## 2017-09-23 ENCOUNTER — Encounter (HOSPITAL_BASED_OUTPATIENT_CLINIC_OR_DEPARTMENT_OTHER): Payer: Self-pay | Admitting: *Deleted

## 2017-09-23 NOTE — Progress Notes (Signed)
SPOKE WITH [name] NPO AFTER MIDNIGHT ARRIVE 630 AM 10-02-17 [facility] TO TAKE ALBUTEROL INHALER PRN/BRING INHALER DRIVER BETH WILSON FRIEND OR [place] NEEDS EKG ISTAT 4

## 2017-09-25 ENCOUNTER — Ambulatory Visit (HOSPITAL_COMMUNITY)
Admission: RE | Admit: 2017-09-25 | Discharge: 2017-09-25 | Disposition: A | Discharge status: Home / Self Care | Payer: Medicare Other | Source: Ambulatory Visit | Attending: Family Medicine | Admitting: Family Medicine

## 2017-09-25 DIAGNOSIS — Z122 Encounter for screening for malignant neoplasm of respiratory organs: Secondary | ICD-10-CM | POA: Diagnosis not present

## 2017-09-25 DIAGNOSIS — J439 Emphysema, unspecified: Secondary | ICD-10-CM | POA: Insufficient documentation

## 2017-09-25 DIAGNOSIS — I700 Atherosclerosis of aorta: Secondary | ICD-10-CM | POA: Insufficient documentation

## 2017-09-25 DIAGNOSIS — F17210 Nicotine dependence, cigarettes, uncomplicated: Secondary | ICD-10-CM | POA: Diagnosis present

## 2017-09-25 DIAGNOSIS — I2510 Atherosclerotic heart disease of native coronary artery without angina pectoris: Secondary | ICD-10-CM | POA: Insufficient documentation

## 2017-10-01 NOTE — Anesthesia Preprocedure Evaluation (Addendum)
Anesthesia Evaluation  Patient identified by MRN, date of birth, ID band Patient awake    Reviewed: Allergy & Precautions, H&P , Patient's Chart, lab work & pertinent test results, reviewed documented beta blocker date and time   Airway Mallampati: II  TM Distance: >3 FB Neck ROM: full    Dental no notable dental hx.    Pulmonary Current Smoker,    Pulmonary exam normal breath sounds clear to auscultation       Cardiovascular hypertension,  Rhythm:regular Rate:Normal     Neuro/Psych    GI/Hepatic   Endo/Other    Renal/GU      Musculoskeletal   Abdominal   Peds  Hematology   Anesthesia Other Findings   Reproductive/Obstetrics                                                             Anesthesia Evaluation  Patient identified by MRN, date of birth, ID band Patient awake    Reviewed: Allergy & Precautions, NPO status , Patient's Chart, lab work & pertinent test results  Airway Mallampati: II  TM Distance: >3 FB Neck ROM: Full    Dental no notable dental hx.    Pulmonary COPD, Current Smoker,    Pulmonary exam normal breath sounds clear to auscultation       Cardiovascular hypertension, Pt. on medications Normal cardiovascular exam Rhythm:Regular Rate:Normal     Neuro/Psych PSYCHIATRIC DISORDERS negative neurological ROS     GI/Hepatic negative GI ROS, GERD  ,(+) Hepatitis -, C  Endo/Other  negative endocrine ROS  Renal/GU Renal disease     Musculoskeletal negative musculoskeletal ROS (+)   Abdominal   Peds  Hematology negative hematology ROS (+)   Anesthesia Other Findings   Reproductive/Obstetrics negative OB ROS                             Anesthesia Physical Anesthesia Plan  ASA: III  Anesthesia Plan: Regional   Post-op Pain Management:    Induction:   PONV Risk Score and Plan:   Airway Management Planned:    Additional Equipment:   Intra-op Plan:   Post-operative Plan:   Informed Consent: I have reviewed the patients History and Physical, chart, labs and discussed the procedure including the risks, benefits and alternatives for the proposed anesthesia with the patient or authorized representative who has indicated his/her understanding and acceptance.   Dental advisory given  Plan Discussed with: CRNA  Anesthesia Plan Comments: (If a tourniquet is used, pt will most likely need GA)        Anesthesia Quick Evaluation  Anesthesia Physical Anesthesia Plan  ASA: III  Anesthesia Plan: General   Post-op Pain Management:    Induction: Intravenous  PONV Risk Score and Plan:   Airway Management Planned: LMA  Additional Equipment:   Intra-op Plan:   Post-operative Plan:   Informed Consent: I have reviewed the patients History and Physical, chart, labs and discussed the procedure including the risks, benefits and alternatives for the proposed anesthesia with the patient or authorized representative who has indicated his/her understanding and acceptance.   Dental Advisory Given  Plan Discussed with: CRNA and Surgeon  Anesthesia Plan Comments: ( May be good candidate for  spinal)  Anesthesia Quick Evaluation  

## 2017-10-02 ENCOUNTER — Other Ambulatory Visit: Payer: Self-pay

## 2017-10-02 ENCOUNTER — Ambulatory Visit (HOSPITAL_BASED_OUTPATIENT_CLINIC_OR_DEPARTMENT_OTHER): Payer: Medicare Other | Admitting: Anesthesiology

## 2017-10-02 ENCOUNTER — Encounter (HOSPITAL_BASED_OUTPATIENT_CLINIC_OR_DEPARTMENT_OTHER): Payer: Self-pay | Admitting: *Deleted

## 2017-10-02 ENCOUNTER — Ambulatory Visit (HOSPITAL_BASED_OUTPATIENT_CLINIC_OR_DEPARTMENT_OTHER)
Admission: RE | Admit: 2017-10-02 | Discharge: 2017-10-02 | Disposition: A | Discharge status: Home / Self Care | Payer: Medicare Other | Source: Ambulatory Visit | Attending: Orthopedic Surgery | Admitting: Orthopedic Surgery

## 2017-10-02 ENCOUNTER — Encounter (HOSPITAL_BASED_OUTPATIENT_CLINIC_OR_DEPARTMENT_OTHER)
Admission: RE | Disposition: A | Discharge status: Home / Self Care | Payer: Self-pay | Source: Ambulatory Visit | Attending: Orthopedic Surgery

## 2017-10-02 DIAGNOSIS — M94271 Chondromalacia, right ankle and joints of right foot: Secondary | ICD-10-CM | POA: Insufficient documentation

## 2017-10-02 DIAGNOSIS — M19071 Primary osteoarthritis, right ankle and foot: Secondary | ICD-10-CM

## 2017-10-02 DIAGNOSIS — Y793 Surgical instruments, materials and orthopedic devices (including sutures) associated with adverse incidents: Secondary | ICD-10-CM | POA: Diagnosis not present

## 2017-10-02 DIAGNOSIS — M25871 Other specified joint disorders, right ankle and foot: Secondary | ICD-10-CM | POA: Insufficient documentation

## 2017-10-02 DIAGNOSIS — Z79899 Other long term (current) drug therapy: Secondary | ICD-10-CM | POA: Insufficient documentation

## 2017-10-02 DIAGNOSIS — M1990 Unspecified osteoarthritis, unspecified site: Secondary | ICD-10-CM | POA: Insufficient documentation

## 2017-10-02 DIAGNOSIS — Z79890 Hormone replacement therapy (postmenopausal): Secondary | ICD-10-CM | POA: Insufficient documentation

## 2017-10-02 DIAGNOSIS — Z969 Presence of functional implant, unspecified: Secondary | ICD-10-CM

## 2017-10-02 DIAGNOSIS — I10 Essential (primary) hypertension: Secondary | ICD-10-CM | POA: Diagnosis not present

## 2017-10-02 DIAGNOSIS — F17210 Nicotine dependence, cigarettes, uncomplicated: Secondary | ICD-10-CM | POA: Insufficient documentation

## 2017-10-02 DIAGNOSIS — J439 Emphysema, unspecified: Secondary | ICD-10-CM | POA: Insufficient documentation

## 2017-10-02 DIAGNOSIS — T8484XA Pain due to internal orthopedic prosthetic devices, implants and grafts, initial encounter: Secondary | ICD-10-CM | POA: Diagnosis present

## 2017-10-02 HISTORY — DX: Dyspnea, unspecified: R06.00

## 2017-10-02 HISTORY — DX: Other intervertebral disc degeneration, lumbar region without mention of lumbar back pain or lower extremity pain: M51.369

## 2017-10-02 HISTORY — PX: ANKLE ARTHROSCOPY: SHX545

## 2017-10-02 HISTORY — DX: Personal history of urinary calculi: Z87.442

## 2017-10-02 HISTORY — DX: Other intervertebral disc degeneration, lumbar region: M51.36

## 2017-10-02 HISTORY — DX: Unspecified chronic bronchitis: J42

## 2017-10-02 HISTORY — DX: Pneumonia, unspecified organism: J18.9

## 2017-10-02 HISTORY — PX: HARDWARE REMOVAL: SHX979

## 2017-10-02 HISTORY — DX: Other intervertebral disc displacement, lumbar region: M51.26

## 2017-10-02 LAB — POCT I-STAT 4, (NA,K, GLUC, HGB,HCT)
GLUCOSE: 115 mg/dL — AB (ref 65–99)
HCT: 42 % (ref 36.0–46.0)
Hemoglobin: 14.3 g/dL (ref 12.0–15.0)
Potassium: 3.9 mmol/L (ref 3.5–5.1)
SODIUM: 143 mmol/L (ref 135–145)

## 2017-10-02 SURGERY — ARTHROSCOPY, ANKLE
Anesthesia: General | Site: Ankle | Laterality: Right

## 2017-10-02 MED ORDER — FENTANYL CITRATE (PF) 100 MCG/2ML IJ SOLN
25.0000 ug | INTRAMUSCULAR | Status: DC | PRN
  Administered 2017-10-02: 25 ug via INTRAVENOUS
  Filled 2017-10-02: qty 1

## 2017-10-02 MED ORDER — HYDROCODONE-ACETAMINOPHEN 5-325 MG PO TABS
1.0000 | ORAL_TABLET | Freq: Once | ORAL | Status: AC
  Administered 2017-10-02: 1 via ORAL
  Filled 2017-10-02: qty 1

## 2017-10-02 MED ORDER — FENTANYL CITRATE (PF) 100 MCG/2ML IJ SOLN
INTRAMUSCULAR | Status: AC
  Filled 2017-10-02: qty 2

## 2017-10-02 MED ORDER — PHENYLEPHRINE 40 MCG/ML (10ML) SYRINGE FOR IV PUSH (FOR BLOOD PRESSURE SUPPORT)
PREFILLED_SYRINGE | INTRAVENOUS | Status: AC
  Filled 2017-10-02: qty 10

## 2017-10-02 MED ORDER — LACTATED RINGERS IV SOLN
INTRAVENOUS | Status: DC
  Filled 2017-10-02: qty 1000

## 2017-10-02 MED ORDER — PROPOFOL 10 MG/ML IV BOLUS
INTRAVENOUS | Status: DC | PRN
  Administered 2017-10-02: 40 mg via INTRAVENOUS
  Administered 2017-10-02: 140 mg via INTRAVENOUS

## 2017-10-02 MED ORDER — LIDOCAINE-EPINEPHRINE (PF) 1 %-1:200000 IJ SOLN
INTRAMUSCULAR | Status: DC | PRN
  Administered 2017-10-02: 10 mL

## 2017-10-02 MED ORDER — FENTANYL CITRATE (PF) 100 MCG/2ML IJ SOLN
25.0000 ug | INTRAMUSCULAR | Status: DC | PRN
  Administered 2017-10-02 (×4): 25 ug via INTRAVENOUS
  Filled 2017-10-02: qty 1

## 2017-10-02 MED ORDER — DEXAMETHASONE SODIUM PHOSPHATE 10 MG/ML IJ SOLN
INTRAMUSCULAR | Status: AC
  Filled 2017-10-02: qty 1

## 2017-10-02 MED ORDER — KETOROLAC TROMETHAMINE 30 MG/ML IJ SOLN
INTRAMUSCULAR | Status: AC
  Filled 2017-10-02: qty 1

## 2017-10-02 MED ORDER — LIDOCAINE 2% (20 MG/ML) 5 ML SYRINGE
INTRAMUSCULAR | Status: DC | PRN
  Administered 2017-10-02: 100 mg via INTRAVENOUS

## 2017-10-02 MED ORDER — LACTATED RINGERS IV SOLN
INTRAVENOUS | Status: DC
  Administered 2017-10-02: 07:00:00 via INTRAVENOUS
  Filled 2017-10-02: qty 1000

## 2017-10-02 MED ORDER — FENTANYL CITRATE (PF) 100 MCG/2ML IJ SOLN
INTRAMUSCULAR | Status: DC | PRN
  Administered 2017-10-02 (×2): 50 ug via INTRAVENOUS
  Administered 2017-10-02: 25 ug via INTRAVENOUS

## 2017-10-02 MED ORDER — ONDANSETRON HCL 4 MG/2ML IJ SOLN
INTRAMUSCULAR | Status: DC | PRN
  Administered 2017-10-02: 4 mg via INTRAVENOUS

## 2017-10-02 MED ORDER — KETOROLAC TROMETHAMINE 30 MG/ML IJ SOLN
INTRAMUSCULAR | Status: DC | PRN
  Administered 2017-10-02: 15 mg via INTRAVENOUS

## 2017-10-02 MED ORDER — FENTANYL CITRATE (PF) 100 MCG/2ML IJ SOLN
100.0000 ug | Freq: Once | INTRAMUSCULAR | Status: DC
  Filled 2017-10-02: qty 2

## 2017-10-02 MED ORDER — DEXAMETHASONE SODIUM PHOSPHATE 10 MG/ML IJ SOLN
INTRAMUSCULAR | Status: DC | PRN
  Administered 2017-10-02: 10 mg via INTRAVENOUS

## 2017-10-02 MED ORDER — PROPOFOL 10 MG/ML IV BOLUS
INTRAVENOUS | Status: AC
  Filled 2017-10-02: qty 40

## 2017-10-02 MED ORDER — HYDROCODONE-ACETAMINOPHEN 5-325 MG PO TABS
ORAL_TABLET | ORAL | Status: AC
  Filled 2017-10-02: qty 1

## 2017-10-02 MED ORDER — LIDOCAINE 2% (20 MG/ML) 5 ML SYRINGE
INTRAMUSCULAR | Status: AC
  Filled 2017-10-02: qty 5

## 2017-10-02 MED ORDER — PHENYLEPHRINE 40 MCG/ML (10ML) SYRINGE FOR IV PUSH (FOR BLOOD PRESSURE SUPPORT)
PREFILLED_SYRINGE | INTRAVENOUS | Status: DC | PRN
  Administered 2017-10-02: 80 ug via INTRAVENOUS
  Administered 2017-10-02: 120 ug via INTRAVENOUS

## 2017-10-02 MED ORDER — HYDROCODONE-ACETAMINOPHEN 5-325 MG PO TABS: 1.0000 | ORAL_TABLET | Freq: Four times a day (QID) | ORAL | 0 refills | Status: DC | PRN

## 2017-10-02 MED ORDER — CHLORHEXIDINE GLUCONATE 4 % EX LIQD
60.0000 mL | Freq: Once | CUTANEOUS | Status: DC
  Filled 2017-10-02: qty 118

## 2017-10-02 MED ORDER — CEFAZOLIN SODIUM-DEXTROSE 2-4 GM/100ML-% IV SOLN
INTRAVENOUS | Status: AC
  Filled 2017-10-02: qty 100

## 2017-10-02 MED ORDER — BUPIVACAINE-EPINEPHRINE (PF) 0.5% -1:200000 IJ SOLN
INTRAMUSCULAR | Status: DC | PRN
  Administered 2017-10-02: 25 mL via PERINEURAL

## 2017-10-02 MED ORDER — ONDANSETRON HCL 4 MG/2ML IJ SOLN
INTRAMUSCULAR | Status: AC
  Filled 2017-10-02: qty 2

## 2017-10-02 MED ORDER — CEFAZOLIN SODIUM-DEXTROSE 2-4 GM/100ML-% IV SOLN
2.0000 g | INTRAVENOUS | Status: AC
  Administered 2017-10-02: 10 g via INTRAVENOUS
  Filled 2017-10-02: qty 100

## 2017-10-02 SURGICAL SUPPLY — 80 items
BANDAGE ACE 4X5 VEL STRL LF (GAUZE/BANDAGES/DRESSINGS) ×4 IMPLANT
BANDAGE ACE 6X5 VEL STRL LF (GAUZE/BANDAGES/DRESSINGS) IMPLANT
BANDAGE ESMARK 6X9 LF (GAUZE/BANDAGES/DRESSINGS) IMPLANT
BLADE CUDA 4.2 (BLADE) IMPLANT
BLADE CUDA GRT WHITE 3.5 (BLADE) IMPLANT
BLADE CUDA SHAVER 3.5 (BLADE) IMPLANT
BLADE CUTTER GATOR 3.5 (BLADE) ×4 IMPLANT
BLADE SURG 15 STRL LF DISP TIS (BLADE) ×4 IMPLANT
BLADE SURG 15 STRL SS (BLADE) ×4
BNDG COHESIVE 4X5 TAN STRL (GAUZE/BANDAGES/DRESSINGS) IMPLANT
BNDG ESMARK 4X9 LF (GAUZE/BANDAGES/DRESSINGS) ×4 IMPLANT
BNDG ESMARK 6X9 LF (GAUZE/BANDAGES/DRESSINGS)
BUR 3.5 LG SPHERICAL (BURR) IMPLANT
BUR OVAL 4.0 (BURR) IMPLANT
BURR 3.5 LG SPHERICAL (BURR)
BURR 3.5MM LG SPHERICAL (BURR)
CANISTER SUCTION 1200CC (MISCELLANEOUS) IMPLANT
CONT SPEC 4OZ CLIKSEAL STRL BL (MISCELLANEOUS) ×4 IMPLANT
COVER BACK TABLE 60X90IN (DRAPES) ×4 IMPLANT
CUFF TOURNIQUET SINGLE 18IN (TOURNIQUET CUFF) ×4 IMPLANT
CUFF TOURNIQUET SINGLE 24IN (TOURNIQUET CUFF) IMPLANT
CUFF TOURNIQUET SINGLE 34IN LL (TOURNIQUET CUFF) IMPLANT
DECANTER SPIKE VIAL GLASS SM (MISCELLANEOUS) IMPLANT
DRAPE ARTHROSCOPY W/POUCH 90 (DRAPES) ×4 IMPLANT
DRAPE EXTREMITY T 121X128X90 (DRAPE) IMPLANT
DRAPE IMP U-DRAPE 54X76 (DRAPES) IMPLANT
DRAPE INCISE IOBAN 66X45 STRL (DRAPES) IMPLANT
DRAPE OEC MINIVIEW 54X84 (DRAPES) IMPLANT
DRAPE U-SHAPE 47X51 STRL (DRAPES) ×4 IMPLANT
DRSG EMULSION OIL 3X3 NADH (GAUZE/BANDAGES/DRESSINGS) ×4 IMPLANT
DRSG PAD ABDOMINAL 8X10 ST (GAUZE/BANDAGES/DRESSINGS) IMPLANT
DURAPREP 26ML APPLICATOR (WOUND CARE) ×4 IMPLANT
ELECT REM PT RETURN 9FT ADLT (ELECTROSURGICAL) ×4
ELECT SMALL JOINT 90D BASC (ELECTRODE) IMPLANT
ELECTRODE REM PT RTRN 9FT ADLT (ELECTROSURGICAL) ×2 IMPLANT
GAUZE SPONGE 4X4 12PLY STRL (GAUZE/BANDAGES/DRESSINGS) ×4 IMPLANT
GLOVE BIOGEL PI IND STRL 8 (GLOVE) ×4 IMPLANT
GLOVE BIOGEL PI INDICATOR 8 (GLOVE) ×4
GLOVE ECLIPSE 7.5 STRL STRAW (GLOVE) ×8 IMPLANT
GOWN STRL REUS W/ TWL LRG LVL3 (GOWN DISPOSABLE) ×2 IMPLANT
GOWN STRL REUS W/ TWL XL LVL3 (GOWN DISPOSABLE) ×2 IMPLANT
GOWN STRL REUS W/TWL LRG LVL3 (GOWN DISPOSABLE) ×2
GOWN STRL REUS W/TWL XL LVL3 (GOWN DISPOSABLE) ×6 IMPLANT
K-WIRE .062X4 (WIRE) IMPLANT
MANIFOLD NEPTUNE II (INSTRUMENTS) ×4 IMPLANT
NDL SAFETY ECLIPSE 18X1.5 (NEEDLE) ×2 IMPLANT
NEEDLE HYPO 18GX1.5 SHARP (NEEDLE) ×2
NEEDLE HYPO 22GX1.5 SAFETY (NEEDLE) IMPLANT
NS IRRIG 1000ML POUR BTL (IV SOLUTION) ×4 IMPLANT
PACK ARTHROSCOPY DSU (CUSTOM PROCEDURE TRAY) ×4 IMPLANT
PACK BASIN DAY SURGERY FS (CUSTOM PROCEDURE TRAY) ×4 IMPLANT
PAD CAST 4YDX4 CTTN HI CHSV (CAST SUPPLIES) ×2 IMPLANT
PADDING CAST ABS 4INX4YD NS (CAST SUPPLIES)
PADDING CAST ABS COTTON 4X4 ST (CAST SUPPLIES) IMPLANT
PADDING CAST COTTON 4X4 STRL (CAST SUPPLIES) ×2
PENCIL BUTTON HOLSTER BLD 10FT (ELECTRODE) ×4 IMPLANT
SLEEVE SCD COMPRESS KNEE MED (MISCELLANEOUS) ×4 IMPLANT
SPLINT FAST PLASTER 5X30 (CAST SUPPLIES)
SPLINT FIBERGLASS 4X30 (CAST SUPPLIES) ×4 IMPLANT
SPLINT PLASTER CAST FAST 5X30 (CAST SUPPLIES) IMPLANT
SPONGE LAP 4X18 X RAY DECT (DISPOSABLE) IMPLANT
STOCKINETTE IMPERVIOUS LG (DRAPES) IMPLANT
STOCKINETTE TUBULAR 6 INCH (GAUZE/BANDAGES/DRESSINGS) IMPLANT
STRAP ANKLE FOOT DISTRACTOR (ORTHOPEDIC SUPPLIES) ×4 IMPLANT
SUCTION FRAZIER HANDLE 10FR (MISCELLANEOUS) ×2
SUCTION TUBE FRAZIER 10FR DISP (MISCELLANEOUS) ×2 IMPLANT
SUT ETHILON 3 0 PS 1 (SUTURE) ×4 IMPLANT
SUT ETHILON 4 0 PS 2 18 (SUTURE) ×4 IMPLANT
SUT VIC AB 2-0 SH 27 (SUTURE)
SUT VIC AB 2-0 SH 27XBRD (SUTURE) IMPLANT
SYR 5ML LL (SYRINGE) ×4 IMPLANT
SYR BULB 3OZ (MISCELLANEOUS) IMPLANT
SYR CONTROL 10ML LL (SYRINGE) ×4 IMPLANT
TOWEL OR 17X24 6PK STRL BLUE (TOWEL DISPOSABLE) ×4 IMPLANT
TOWEL OR NON WOVEN STRL DISP B (DISPOSABLE) ×4 IMPLANT
TUBE CONNECTING 12'X1/4 (SUCTIONS) ×1
TUBE CONNECTING 12X1/4 (SUCTIONS) ×3 IMPLANT
TUBING ARTHRO INFLOW-ONLY STRL (TUBING) ×4 IMPLANT
UNDERPAD 30X30 (UNDERPADS AND DIAPERS) ×4 IMPLANT
WATER STERILE IRR 1000ML POUR (IV SOLUTION) ×4 IMPLANT

## 2017-10-02 NOTE — Brief Op Note (Signed)
10/02/2017  9:29 AM  PATIENT:  [name]  72 y.o. female  PRE-OPERATIVE DIAGNOSIS:  PAINFUL HARDWARE RIGHT ANKLE S/P OPEN REDUCTION INTERNAL FIXATION WITH SYNOVITIS  POST-OPERATIVE DIAGNOSIS:  INTERNAL FIXATION WITH SYNOVITIS  PROCEDURE:  Procedure(s) with comments: RIGHT ANKLE ARTHROSCOPY WITH EXTENSIVE DEBRIDEMENT (Right) - 90 MINUTES FOR CASE HARDWARE REMOVAL (Right)  SURGEON:  Surgeon(s) and Role:    Dorna Leitz, MD - Primary  PHYSICIAN ASSISTANT:   ASSISTANTS: bethune   ANESTHESIA:   general  EBL:  none  BLOOD ADMINISTERED:none  DRAINS: none   LOCAL MEDICATIONS USED:  MARCAINE     SPECIMEN:  No Specimen  DISPOSITION OF SPECIMEN:  N/A  COUNTS:  YES  TOURNIQUET:  * Missing tourniquet times found for documented tourniquets in log: [id number] *  DICTATION: .Other Dictation: Dictation Number missed it  PLAN OF CARE: Discharge to home after PACU  PATIENT DISPOSITION:  PACU - hemodynamically stable.   Delay start of Pharmacological VTE agent (>24hrs) due to surgical blood loss or risk of bleeding: no

## 2017-10-02 NOTE — H&P (Signed)
PREOPERATIVE H&P  Chief Complaint: r ankle pain  HPI: [name] is a 72 y.o. female who presents for evaluation of r ankle pain. It has been present for several months and has been worsening. She had ankle orif and has had pain over hardware and was helped with intra-articular injection She has failed conservative measures. Pain is rated as moderate.  Past Medical History:  Diagnosis Date  . Allergy   . Ankle fracture 03/16/2017   right  . Bulging lumbar disc    L4 OR L5 TAKES STEROID INJECTIONS FOR TOOK INJECTION MARCH 2019  . Chronic bronchitis ([place]) FEW YEARS AGO  . Chronic hepatitis C ([place])    TOOK HARVONI TX 2017   . Colon polyp   . COPD (chronic obstructive pulmonary disease) ([place])   . Diverticulitis 65YRS AGO  . Dyspnea    WITH HEAVY EXERTION  . Emphysema of lung (H[facility])    TOOK PULMONARY FUCTION TEST AND PASSED FEW WEEKS AGO AT FAMILY  MD  . Gallstones   . GERD (gastroesophageal reflux disease)   . History of kidney stones 20 YRS AGO   . Hypertension   . Osteoarthritis   . Pneumonia AT BIRTH  . Spondylosis of cervical region without myelopathy or radiculopathy    Past Surgical History:  Procedure Laterality Date  . ABDOMINAL HYSTERECTOMY  1980   PARTIAL  . BREAST BIOPSY  02/07/2010  . BREAST SURGERY  2011   biopsy  . CHOLECYSTECTOMY  1999   LAPAROSCOPIC  . ORIF ANKLE FRACTURE Right 03/20/2017   Procedure: OPEN REDUCTION INTERNAL FIXATION (ORIF) ANKLE FRACTURE;  Surgeon: Dorna Leitz, MD;  Location: [facility];  Service: Orthopedics;  Laterality: Right;  . TONSILLECTOMY AND ADENOIDECTOMY  1950's   Social History   Socioeconomic History  . Marital status: Legally Separated    Spouse name: Not on file  . Number of children: 0  . Years of education: Not on file  . Highest education level: Not on file  Occupational History  . Occupation: retired  Scientific laboratory technician  . Financial resource strain: Not on file  . Food insecurity:    Worry: Not on file     Inability: Not on file  . Transportation needs:    Medical: Not on file    Non-medical: Not on file  Tobacco Use  . Smoking status: Current Every Day Smoker    Packs/day: 0.50    Years: 53.00    Pack years: 26.50    Types: Cigarettes    Start date: 07/20/1960  . Smokeless tobacco: Never Used  Substance and Sexual Activity  . Alcohol use: Not Currently    Alcohol/week: 0.0 oz    Frequency: Never    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  . Drug use: No  . Sexual activity: Not on file  Lifestyle  . Physical activity:    Days per week: Not on file    Minutes per session: Not on file  . Stress: Not on file  Relationships  . Social connections:    Talks on phone: Not on file    Gets together: Not on file    Attends religious service: Not on file    Active member of club or organization: Not on file    Attends meetings of clubs or organizations: Not on file    Relationship status: Not on file  Other Topics Concern  . Not on file  Social History Narrative  . Not on file  Family History  Problem Relation Age of Onset  . Heart disease Mother   . Stroke Brother   . Stroke Maternal Uncle   . COPD Paternal Uncle    Allergies  Allergen Reactions  . Chantix [Varenicline]     Makes her feel crazy  . Codeine     Itch    Prior to Admission medications   Medication Sig Start Date End Date Taking? Authorizing Provider  amLODipine (NORVASC) 5 MG tablet Take 1 tablet by mouth every evening.  11/11/14  Yes [provider]  atorvastatin (LIPITOR) 20 MG tablet Take 20 mg by mouth every evening.    Yes [provider]  estradiol (ESTRACE) 0.5 MG tablet 0.5 mg every evening. Once daily 04/21/13  Yes [provider]  fesoterodine (TOVIAZ) 8 MG TB24 tablet Take 8 mg by mouth every evening.    Yes [provider]  HYDROmorphone (DILAUDID) 2 MG tablet Take by mouth every 4 (four) hours as needed for severe pain.   Yes [provider]   montelukast (SINGULAIR) 10 MG tablet Take 10 mg by mouth every evening.    Yes [provider]  traMADol (ULTRAM) 50 MG tablet Take by mouth 2 (two) times daily.    Yes [provider]  albuterol (PROVENTIL HFA;VENTOLIN HFA) 108 (90 BASE) MCG/ACT inhaler Inhale 2 puffs into the lungs every 6 (six) hours as needed.    [provider]  dexlansoprazole (DEXILANT) 60 MG capsule Take 60 mg by mouth as needed.     [provider]     Positive ROS: none  All other systems have been reviewed and were otherwise negative with the exception of those mentioned in the HPI and as above.  Physical Exam: Vitals:   10/02/17 0629  BP: 120/72  Pulse: 95  Resp: 16  Temp: 98 F (36.7 C)  SpO2: 98%    General: Alert, no acute distress Cardiovascular: No pedal edema Respiratory: No cyanosis, no use of accessory musculature GI: No organomegaly, abdomen is soft and non-tender Skin: No lesions in the area of chief complaint Neurologic: Sensation intact distally Psychiatric: Patient is competent for consent with normal mood and affect Lymphatic: No axillary or cervical lymphadenopathy  MUSCULOSKELETAL: r ankle :pain over hardware pain with rom nvi distally  Assessment/Plan: PAINFUL HARDWARE RIGHT ANKLE S/P OPEN REDUCTION INTERNAL FIXATION WITH SYNOVITIS Plan for Procedure(s): RIGHT ANKLE ARTHROSCOPY WITH HARDWARE REMOVAL HARDWARE REMOVAL  The risks benefits and alternatives were discussed with the patient including but not limited to the risks of nonoperative treatment, versus surgical intervention including infection, bleeding, nerve injury, malunion, nonunion, hardware prominence, hardware failure, need for hardware removal, blood clots, cardiopulmonary complications, morbidity, mortality, among others, and they were willing to proceed.  Predicted outcome is good, although there will be at least a six to nine month expected recovery.  Alta Corning,  MD 10/02/2017 8:27 AM

## 2017-10-02 NOTE — Anesthesia Procedure Notes (Signed)
Procedure Name: LMA Insertion Date/Time: 10/02/2017 8:35 AM Performed by: Bonney Aid, CRNA Pre-anesthesia Checklist: Patient identified, Emergency Drugs available, Suction available and Patient being monitored Patient Re-evaluated:Patient Re-evaluated prior to induction Oxygen Delivery Method: Circle system utilized Preoxygenation: Pre-oxygenation with 100% oxygen Induction Type: IV induction Ventilation: Mask ventilation without difficulty LMA: LMA inserted LMA Size: 4.0 Number of attempts: 1 Airway Equipment and Method: Bite block Placement Confirmation: positive ETCO2 Tube secured with: Tape Dental Injury: Teeth and Oropharynx as per pre-operative assessment

## 2017-10-02 NOTE — Transfer of Care (Signed)
Immediate Anesthesia Transfer of Care Note  Patient: [name]  Procedure(s) Performed: RIGHT ANKLE ARTHROSCOPY WITH EXTENSIVE DEBRIDEMENT (Right Ankle) HARDWARE REMOVAL (Right )  Patient Location: PACU  Anesthesia Type:General  Level of Consciousness: sedated and responds to stimulation  Airway & Oxygen Therapy: Patient Spontanous Breathing and Patient connected to nasal cannula oxygen  Post-op Assessment: Report given to RN  Post vital signs: Reviewed and stable  Last Vitals: 120/58 Vitals Value Taken Time  BP    Temp    Pulse 84 10/02/2017 10:15 AM  Resp 15 10/02/2017 10:15 AM  SpO2 98 % 10/02/2017 10:15 AM  Vitals shown include unvalidated device data.  Last Pain:  Vitals:   10/02/17 0642  TempSrc:   PainSc: 4       Patients Stated Pain Goal: 7 (93/23/55 7322)  Complications: No apparent anesthesia complications

## 2017-10-02 NOTE — Anesthesia Procedure Notes (Signed)
Anesthesia Regional Block: Popliteal block   Pre-Anesthetic Checklist: ,, timeout performed, Correct Patient, Correct Site, Correct Laterality, Correct Procedure, Correct Position, site marked, Risks and benefits discussed, pre-op evaluation,  At surgeon's request and post-op pain management  Laterality: Right  Prep: chloraprep       Needles:   Needle Type: Echogenic Needle     Needle Length: 9cm  Needle Gauge: 21     Additional Needles:   Procedures:,,,, ultrasound used (permanent image in chart),,,,  Narrative:  Start time: 10/02/2017 9:57 AM End time: 10/02/2017 10:03 AM Anesthesiologist: Lyndle Herrlich, MD

## 2017-10-02 NOTE — Op Note (Signed)
NAME:  [name], [name]                    ACCOUNT NO.:  MEDICAL RECORD NO.:  [id number]  LOCATION:                                 FACILITY:  PHYSICIAN:  Alta Corning, M.D.        DATE OF BIRTH:  DATE OF PROCEDURE:  10/02/2017 DATE OF DISCHARGE:                              OPERATIVE REPORT   PREOPERATIVE DIAGNOSIS:  Status post open reduction and internal fixation of ankle with continued pain around medial and lateral hardware and within the ankle joint.  POSTOPERATIVE DIAGNOSIS:  Status post open reduction and internal fixation of ankle with continued pain around medial and lateral hardware and within the ankle joint.  PROCEDURES: 1. Ankle arthroscopy with extensive debridement of medial and lateral     gutters and anteromedial tibial plafond and anterolateral talus. 2. Removal of lateral hardware including 6 screws and a plate. 3. Removal of medial hardware including 2 cannulated screws.  SURGEON:  Alta Corning, M.D.  Terrence DupontModena Slater.  ANESTHESIA:  General.  BRIEF HISTORY:  [name] is a 72 year old female with a history of having open reduction and internal fixation of her ankle.  She did well initially, but as she began becoming more active, she began having increasing pain.  We encouraged her to wait until 6 months.  She continued to have pain and swelling.  She definitely had 1 prominent distal screw and after failure of conservative care, we injected her ankle that gave her pretty good relief.  So, we felt like there was at least pathology within the ankle as well as this painful prominent hardware.  So, we brought her to the operating room for ankle arthroscopy and hardware excision.  DESCRIPTION OF PROCEDURE:  The patient was brought to the operative room and after adequate anesthesia was obtained with general anesthetic, the patient was placed supine on the operating room table.  The right leg was prepped and draped in usual sterile fashion.   Following this, the leg was exsanguinated.  Blood pressure inflated to 250 mmHg.  Following this, 10 mL of fluid was instilled into the ankle joint from the anteromedial portal.  A small incision was made here and the ankle scope was put in place.  Following this, a lateral portal was established with the direct vision of a spinal needle.  Once these portals were established, it became quite clear there was significant tissue in both the medial and lateral gutters.  This was debrided with a suction shaver.  There was significant injury to the anterolateral talus.  This was debrided back to a smooth and stable rim removing all fragmenting pieces of cartilage.  We then reversed the camera and put the camera on the lateral side, and came immediately, there was significant medial gutter impingement which was debrided and then there was significant chondromalacia on the anteromedial tibial plafond.  We debrided this up as best as we could and did leave exposed bone on the anteromedial tibial plafond and exposed bone on the anterolateral talus, but these were cleaned up.  The remainder of the joint centrally looked pretty good at this point.  So, at this point,  the ankle was irrigated and suctioned dry.  Attention at this time was turned laterally where incision was made over the lateral hardware.  Subcutaneous tissue was dissected lateral to the level of the hardware.  The hardware was identified.  Five screws were removed and then the plate was removed and then the front and back screw was removed.  We roughened up the bone and debrided all other tissue around the plate.  Attention was then turned medially.  A small incision was made there.  We identified the heads of the cannulated screws.  I removed both.  The wounds were irrigated, suctioned dry, closed in layers.  Sterile compressive dressing was applied as well as a posterior splint and compressive dressing; and the patient was taken to  the recovery room where she was noted to be in satisfactory condition.  Estimated blood loss for the procedure was minimal.     Alta Corning, M.D.     Corliss Skains  D:  10/02/2017  T:  10/02/2017  Job:  [id number]

## 2017-10-02 NOTE — Discharge Instructions (Signed)
Ambulate weightbearing as tolerated. Wear your boot when up walking. Keep dressing in place until your follow-up. Elevate and ice your right ankle as much as possible Use crutches or a walker as needed for a few days.  Do not take any nonsteroidal anti inflammatories (Ibuprofen, Advil, aleve, Motrin) until after 2:15pm today.     Post Anesthesia Home Care Instructions  Activity: Get plenty of rest for the remainder of the day. A responsible individual must stay with you for 24 hours following the procedure.  For the next 24 hours, DO NOT: -Drive a car -Paediatric nurse -Drink alcoholic beverages -Take any medication unless instructed by your physician -Make any legal decisions or sign important papers.  Meals: Start with liquid foods such as gelatin or soup. Progress to regular foods as tolerated. Avoid greasy, spicy, heavy foods. If nausea and/or vomiting occur, drink only clear liquids until the nausea and/or vomiting subsides. Call your physician if vomiting continues.  Special Instructions/Symptoms: Your throat may feel dry or sore from the anesthesia or the breathing tube placed in your throat during surgery. If this causes discomfort, gargle with warm salt water. The discomfort should disappear within 24 hours.

## 2017-10-03 ENCOUNTER — Encounter (HOSPITAL_BASED_OUTPATIENT_CLINIC_OR_DEPARTMENT_OTHER): Payer: Self-pay | Admitting: Orthopedic Surgery

## 2017-10-03 NOTE — Anesthesia Postprocedure Evaluation (Signed)
Anesthesia Post Note  Patient: Fish farm manager  Procedure(s) Performed: RIGHT ANKLE ARTHROSCOPY WITH EXTENSIVE DEBRIDEMENT (Right Ankle) HARDWARE REMOVAL (Right )     Anesthesia Type: General    Last Vitals:  Vitals:   10/02/17 1119 10/02/17 1202  BP:  124/60  Pulse: 77 90  Resp: 15 16  Temp:  37.1 C  SpO2: 97% 98%    Last Pain:  Vitals:   10/03/17 1343  TempSrc:   PainSc: 4    Pain Goal: Patients Stated Pain Goal: 7 (10/02/17 1561)               Riccardo Dubin

## 2017-10-24 ENCOUNTER — Ambulatory Visit: Payer: Medicare Other | Attending: Orthopedic Surgery | Admitting: Physical Therapy

## 2017-10-24 DIAGNOSIS — M25571 Pain in right ankle and joints of right foot: Secondary | ICD-10-CM

## 2017-10-24 DIAGNOSIS — M25671 Stiffness of right ankle, not elsewhere classified: Secondary | ICD-10-CM | POA: Diagnosis present

## 2017-10-24 DIAGNOSIS — R2689 Other abnormalities of gait and mobility: Secondary | ICD-10-CM

## 2017-10-24 DIAGNOSIS — R262 Difficulty in walking, not elsewhere classified: Secondary | ICD-10-CM | POA: Diagnosis present

## 2017-10-24 NOTE — Patient Instructions (Signed)
Ankle Alphabet   Using left ankle and foot only, trace the letters of the alphabet. Perform A to Z. Repeat __1-2__ times per set.   Dorsiflexion: Resisted   Facing anchor, tubing around left foot, pull toward face.  Repeat __10-15__ times per set.   Plantar Flexion: Resisted   Anchor behind, tubing around left foot, press down. Repeat __10-15__ times per set.   Inversion: Resisted   Cross legs with right leg underneath, foot in tubing loop. Hold tubing around other foot to resist and turn foot in. Repeat _10-15___ times per set.   Eversion: Resisted   With right foot in tubing loop, hold tubing around other foot to resist and turn foot out. Repeat _10-15___ times per set.

## 2017-10-24 NOTE — Therapy (Signed)
[facility], [place], [address] Phone: [phone]   Fax:  [phone]  Physical Therapy Evaluation  Patient Details  Name: [name] MRN: [id number] Date of Birth: 01/19/1946 Referring Provider: Dr. Berenice Primas   Encounter Date: 10/24/2017  PT End of Session - 10/24/17 1310    Visit Number  1    Number of Visits  16    Date for PT Re-Evaluation  12/19/17    PT Start Time  1016    PT Stop Time  1054    PT Time Calculation (min)  38 min    Activity Tolerance  Patient tolerated treatment well    Behavior During Therapy  [facility] for tasks assessed/performed       Past Medical History:  Diagnosis Date  . Allergy   . Ankle fracture 03/16/2017   right  . Bulging lumbar disc    L4 OR L5 TAKES STEROID INJECTIONS FOR TOOK INJECTION MARCH 2019  . Chronic bronchitis ([place]) FEW YEARS AGO  . Chronic hepatitis C ([place])    TOOK HARVONI TX 2017   . Colon polyp   . COPD (chronic obstructive pulmonary disease) ([place])   . Diverticulitis 64YRS AGO  . Dyspnea    WITH HEAVY EXERTION  . Emphysema of lung (HCC)    TOOK PULMONARY FUCTION TEST AND PASSED FEW WEEKS AGO AT FAMILY  MD  . Gallstones   . GERD (gastroesophageal reflux disease)   . History of kidney stones 20 YRS AGO   . Hypertension   . Osteoarthritis   . Pneumonia AT BIRTH  . Spondylosis of cervical region without myelopathy or radiculopathy     Past Surgical History:  Procedure Laterality Date  . ABDOMINAL HYSTERECTOMY  1980   PARTIAL  . ANKLE ARTHROSCOPY Right 10/02/2017   Procedure: RIGHT ANKLE ARTHROSCOPY WITH EXTENSIVE DEBRIDEMENT;  Surgeon: Dorna Leitz, MD;  Location: [facility];  Service: Orthopedics;  Laterality: Right;  90 MINUTES FOR CASE  . BREAST BIOPSY  02/07/2010  . BREAST SURGERY  2011   biopsy  . CHOLECYSTECTOMY  1999   LAPAROSCOPIC  . HARDWARE REMOVAL Right 10/02/2017   Procedure: HARDWARE REMOVAL;  Surgeon: Dorna Leitz, MD;  Location: [facility];  Service: Orthopedics;  Laterality: Right;  . ORIF ANKLE FRACTURE Right 03/20/2017   Procedure: OPEN REDUCTION INTERNAL FIXATION (ORIF) ANKLE FRACTURE;  Surgeon: Dorna Leitz, MD;  Location: [facility];  Service: Orthopedics;  Laterality: Right;  . TONSILLECTOMY AND ADENOIDECTOMY  1950's    There were no vitals filed for this visit.   Subjective Assessment - 10/24/17 1017    Subjective  patient s/p hardware removal of R ankle 10/02/17 - had ORIF on 03/10/17 after a fall. Ambulating well in full weight bearing - keeps ankle wrapped in ace bandage - still has steri-strips - using guaze and bandage as "padding" Feels like she can move her ankle a little more since surgery    Patient Stated Goals  improve walking, regain motion    Currently in Pain?  No/denies    Pain Score  0-No pain did take pain medication this morning         [facility] PT Assessment - 10/24/17 1024      Assessment   Medical Diagnosis  s/p R hardware removal and ankle scope    Referring Provider  Dr. Berenice Primas    Onset Date/Surgical Date  10/02/17    Next MD Visit  -- ~  2-3 weeks    Prior Therapy  yes - prior to hardware removal      Precautions   Precautions  None      Restrictions   Weight Bearing Restrictions  No      Balance Screen   Has the patient fallen in the past 6 months  No    Has the patient had a decrease in activity level because of a fear of falling?   No    Is the patient reluctant to leave their home because of a fear of falling?   No      Home Environment   Living Environment  Private residence    Living Arrangements  Alone    Type of [place] to enter    Entrance Stairs-Number of Steps  2    Entrance Stairs-Rails  None has to reach for post and hold on    [place]  One level    [place]  None      Prior Function   Level of [place]  Retired    Leisure  likes to  walk      Cognition   Overall Cognitive Status  Within Functional Limits for tasks assessed      Observation/Other Assessments   Focus on Therapeutic Outcomes (FOTO)   Ankle: 45 (55% limited, predicted 50% limited)      ROM / Strength   AROM / PROM / Strength  AROM;PROM;Strength      AROM   Right/Left Ankle  Right    Right Ankle Dorsiflexion  -2    Right Ankle Plantar Flexion  47    Right Ankle Inversion  23    Right Ankle Eversion  10      PROM   Right/Left Ankle  Right    Right Ankle Dorsiflexion  1    Right Ankle Plantar Flexion  50    Right Ankle Inversion  28    Right Ankle Eversion  18      Strength   Right/Left Ankle  Right    Right Ankle Dorsiflexion  3/5    Right Ankle Plantar Flexion  3-/5    Right Ankle Inversion  3/5    Right Ankle Eversion  2-/5                Objective measurements completed on examination: See above findings.      [facility] Adult PT Treatment/Exercise - 10/24/17 0001      Ankle Exercises: Supine   Other Supine Ankle Exercises  ABC's x 1 - R LE    Other Supine Ankle Exercises  R LE - 4 way resisted ankle x 15 reps each - yellow tband             PT Education - 10/24/17 1309    Education provided  Yes    Education Details  exam findings, POC, HEP    Person(s) Educated  Patient    Methods  Explanation;Demonstration;Handout    Comprehension  Verbalized understanding;Returned demonstration       PT Short Term Goals - 10/24/17 1315      PT SHORT TERM GOAL #1   Title  patient to be independent with initial HEP    Status  New    Target Date  11/07/17        PT Long Term Goals - 10/24/17 1315      PT LONG TERM  GOAL #1   Title  patient to be independent with advanced HEP    Status  New    Target Date  12/19/17      PT LONG TERM GOAL #2   Title  patient to improve R ankle AROM to [facility] to allow for improved gait mechanics    Status  New    Target Date  12/19/17      PT LONG TERM GOAL #3   Title  patient to  demonstrate good heel strike and appropriate gait mechanics with good stability and minimal deviation    Status  New    Target Date  12/19/17      PT LONG TERM GOAL #4   Title  patient to improve R ankle strength to >/= 4/5    Status  New    Target Date  12/19/17             Plan - 10/24/17 1311    Clinical Impression Statement  [name] presenting to [place] today s/p hardware removal at R ankle on 10/02/17, following ORIF on 03/10/17. Patient today ambulating without AD, but with slight antalgic gait pattern as well as reduced gait mechiancs due to restricted motion at R ankle. Patient today with deficits in R ankle AROM and strength, as seen above, expected s/p surgical intervention. Patient to benefit from skilled PT intervention to address deficits in functional mobility to allow for improved gait mechanics and QOL.     Clinical Presentation  Stable    Clinical Decision Making  Low    PT Treatment/Interventions  ADLs/Self Care Home Management;Cryotherapy;Electrical Stimulation;Gait training;Balance training;Therapeutic exercise;Therapeutic activities;Functional mobility training;Stair training;Patient/family education;Manual techniques;Vasopneumatic Device;Passive range of motion;Taping    Consulted and Agree with Plan of Care  Patient       Patient will benefit from skilled therapeutic intervention in order to improve the following deficits and impairments:  Abnormal gait, Decreased activity tolerance, Decreased balance, Decreased mobility, Decreased strength, Increased edema, Impaired flexibility, Pain, Decreased range of motion, Difficulty walking  Visit Diagnosis: Pain in right ankle and joints of right foot  Stiffness of right ankle, not elsewhere classified  Difficulty in walking, not elsewhere classified  Other abnormalities of gait and mobility     Problem List Patient Active Problem List   Diagnosis Date Noted  . Localized osteoarthritis of right ankle 10/02/2017  .  Retained orthopedic hardware 10/02/2017  . Bimalleolar ankle fracture, right, closed, initial encounter 03/20/2017  . Bimalleolar fracture of right ankle 03/20/2017  . Depression with anxiety 12/25/2011  . HTN (hypertension) 12/25/2011  . GERD (gastroesophageal reflux disease) 12/25/2011  . COPD, moderate ([place]) 12/25/2011  . Chronic constipation 12/25/2011     Lanney Gins, PT, DPT 10/24/17 1:20 PM   [address], [place], [address] Phone: [phone]   Fax:  [phone]  Name: [name] MRN: [id number] Date of Birth: 1946/03/07

## 2017-10-29 ENCOUNTER — Encounter: Payer: Self-pay | Admitting: Physical Therapy

## 2017-10-29 ENCOUNTER — Ambulatory Visit: Payer: Medicare Other | Admitting: Physical Therapy

## 2017-10-29 DIAGNOSIS — R262 Difficulty in walking, not elsewhere classified: Secondary | ICD-10-CM

## 2017-10-29 DIAGNOSIS — M25671 Stiffness of right ankle, not elsewhere classified: Secondary | ICD-10-CM

## 2017-10-29 DIAGNOSIS — R2689 Other abnormalities of gait and mobility: Secondary | ICD-10-CM

## 2017-10-29 DIAGNOSIS — M25571 Pain in right ankle and joints of right foot: Secondary | ICD-10-CM

## 2017-10-29 NOTE — Therapy (Signed)
[place], [place], [address] Phone: [phone]   Fax:  [phone]  Physical Therapy Treatment  Patient Details  Name: [name] MRN: [id number] Date of Birth: [birth year] Referring Provider: Dr. Berenice Primas   Encounter Date: 10/29/2017  PT End of Session - 10/29/17 1101    Visit Number  2    Number of Visits  16    Date for PT Re-Evaluation  12/19/17    PT Start Time  3810    PT Stop Time  1114    PT Time Calculation (min)  59 min    Activity Tolerance  Patient tolerated treatment well    Behavior During Therapy  [facility] for tasks assessed/performed       Past Medical History:  Diagnosis Date  . Allergy   . Ankle fracture 03/16/2017   right  . Bulging lumbar disc    L4 OR L5 TAKES STEROID INJECTIONS FOR TOOK INJECTION MARCH 2019  . Chronic bronchitis ([place]) FEW YEARS AGO  . Chronic hepatitis C ([place])    TOOK HARVONI TX 2017   . Colon polyp   . COPD (chronic obstructive pulmonary disease) ([place])   . Diverticulitis 24YRS AGO  . Dyspnea    WITH HEAVY EXERTION  . Emphysema of lung (HCC)    TOOK PULMONARY FUCTION TEST AND PASSED FEW WEEKS AGO AT FAMILY  MD  . Gallstones   . GERD (gastroesophageal reflux disease)   . History of kidney stones 20 YRS AGO   . Hypertension   . Osteoarthritis   . Pneumonia AT BIRTH  . Spondylosis of cervical region without myelopathy or radiculopathy     Past Surgical History:  Procedure Laterality Date  . ABDOMINAL HYSTERECTOMY  1980   PARTIAL  . ANKLE ARTHROSCOPY Right 10/02/2017   Procedure: RIGHT ANKLE ARTHROSCOPY WITH EXTENSIVE DEBRIDEMENT;  Surgeon: Dorna Leitz, MD;  Location: [place];  Service: Orthopedics;  Laterality: Right;  90 MINUTES FOR CASE  . BREAST BIOPSY  02/07/2010  . BREAST SURGERY  2011   biopsy  . CHOLECYSTECTOMY  1999   LAPAROSCOPIC  . HARDWARE REMOVAL Right 10/02/2017   Procedure: HARDWARE REMOVAL;  Surgeon: Dorna Leitz, MD;  Location: [facility];  Service: Orthopedics;  Laterality: Right;  . ORIF ANKLE FRACTURE Right 03/20/2017   Procedure: OPEN REDUCTION INTERNAL FIXATION (ORIF) ANKLE FRACTURE;  Surgeon: Dorna Leitz, MD;  Location: [place];  Service: Orthopedics;  Laterality: Right;  . TONSILLECTOMY AND ADENOIDECTOMY  1950's    There were no vitals filed for this visit.  Subjective Assessment - 10/29/17 1014    Subjective  "its stiff"     Currently in Pain?  No/denies    Pain Score  0-No pain                       [facility] Adult PT Treatment/Exercise - 10/29/17 0001      Vasopneumatic   Number Minutes Vasopneumatic   15 minutes    Vasopnuematic Location   Ankle    Vasopneumatic Pressure  Medium    Vasopneumatic Temperature   32      Manual Therapy   Manual Therapy  Passive ROM    Manual therapy comments  some PROM taken to end range and held    Passive ROM  R ankle all motions       Ankle Exercises: Aerobic   Recumbent Bike  Seat  1 x 16min     Nustep  L1 x4 min LE only       Ankle Exercises: Seated   Ankle Circles/Pumps  20 reps    Other Seated Ankle Exercises  4 way ankle PRE's x15 each       Ankle Exercises: Machines for Strengthening   Cybex Leg Press  30lb 2x10; heel raises 30lb 3x15      Ankle Exercises: Stretches   Gastroc Stretch  5 reps;10 seconds      Ankle Exercises: Standing   Other Standing Ankle Exercises  heel raises airex x20    Other Standing Ankle Exercises  4in step downs trying to keel R heel flat 2x10                PT Short Term Goals - 10/24/17 1315      PT SHORT TERM GOAL #1   Title  patient to be independent with initial HEP    Status  New    Target Date  11/07/17        PT Long Term Goals - 10/24/17 1315      PT LONG TERM GOAL #1   Title  patient to be independent with advanced HEP    Status  New    Target Date  12/19/17      PT LONG TERM GOAL #2   Title  patient to improve R ankle  AROM to [facility] to allow for improved gait mechanics    Status  New    Target Date  12/19/17      PT LONG TERM GOAL #3   Title  patient to demonstrate good heel strike and appropriate gait mechanics with good stability and minimal deviation    Status  New    Target Date  12/19/17      PT LONG TERM GOAL #4   Title  patient to improve R ankle strength to >/= 4/5    Status  New    Target Date  12/19/17            Plan - 10/29/17 1102    Clinical Impression Statement  Pt tolerated an initial progression to exercises and MT well. She was a bit hesitant and guarded during MT but tended to relax as we progressed. Some compensation with heel raises on leg press and with 4 inch step downs. Pt still ambulated with mild limp avoiding R ankle DF, but it may be due to habit cine pt has the passive motion    Rehab Potential  Good    PT Frequency  1x / week    PT Duration  8 weeks    PT Treatment/Interventions  ADLs/Self Care Home Management;Cryotherapy;Electrical Stimulation;Gait training;Balance training;Therapeutic exercise;Therapeutic activities;Functional mobility training;Stair training;Patient/family education;Manual techniques;Vasopneumatic Device;Passive range of motion;Taping    PT Next Visit Plan  R ankle stability, strength and ROM       Patient will benefit from skilled therapeutic intervention in order to improve the following deficits and impairments:  Abnormal gait, Decreased activity tolerance, Decreased balance, Decreased mobility, Decreased strength, Increased edema, Impaired flexibility, Pain, Decreased range of motion, Difficulty walking  Visit Diagnosis: Pain in right ankle and joints of right foot  Stiffness of right ankle, not elsewhere classified  Difficulty in walking, not elsewhere classified  Other abnormalities of gait and mobility     Problem List Patient Active Problem List   Diagnosis Date Noted  . Localized osteoarthritis of right ankle 10/02/2017  .  Retained orthopedic hardware  10/02/2017  . Bimalleolar ankle fracture, right, closed, initial encounter 03/20/2017  . Bimalleolar fracture of right ankle 03/20/2017  . Depression with anxiety 12/25/2011  . HTN (hypertension) 12/25/2011  . GERD (gastroesophageal reflux disease) 12/25/2011  . COPD, moderate ([place]) 12/25/2011  . Chronic constipation 12/25/2011    Scot Jun, PTA 10/29/2017, 11:08 AM  [place], [place], [address] Phone: [phone]   Fax:  [phone]  Name: [name] MRN: [id number] Date of Birth: Jul 27, 1945

## 2017-10-31 ENCOUNTER — Ambulatory Visit: Payer: Medicare Other | Attending: Orthopedic Surgery | Admitting: Physical Therapy

## 2017-10-31 ENCOUNTER — Encounter: Payer: Self-pay | Admitting: Physical Therapy

## 2017-10-31 DIAGNOSIS — R262 Difficulty in walking, not elsewhere classified: Secondary | ICD-10-CM

## 2017-10-31 DIAGNOSIS — M19071 Primary osteoarthritis, right ankle and foot: Secondary | ICD-10-CM | POA: Insufficient documentation

## 2017-10-31 DIAGNOSIS — M25571 Pain in right ankle and joints of right foot: Secondary | ICD-10-CM | POA: Diagnosis present

## 2017-10-31 DIAGNOSIS — R2689 Other abnormalities of gait and mobility: Secondary | ICD-10-CM | POA: Diagnosis present

## 2017-10-31 DIAGNOSIS — M25671 Stiffness of right ankle, not elsewhere classified: Secondary | ICD-10-CM | POA: Insufficient documentation

## 2017-10-31 DIAGNOSIS — R600 Localized edema: Secondary | ICD-10-CM | POA: Diagnosis present

## 2017-10-31 NOTE — Therapy (Signed)
[place], [place], [address] Phone: [phone]   Fax:  [phone]  Physical Therapy Treatment  Patient Details  Name: [name] MRN: [id number] Date of Birth: 04/17/1946 Referring Provider: Dr. Berenice Primas   Encounter Date: 10/31/2017  PT End of Session - 10/31/17 1520    Visit Number  3    Number of Visits  16    Date for PT Re-Evaluation  12/19/17    PT Start Time  1425    PT Stop Time  1535    PT Time Calculation (min)  70 min    Activity Tolerance  Patient tolerated treatment well    Behavior During Therapy  [facility] for tasks assessed/performed       Past Medical History:  Diagnosis Date  . Allergy   . Ankle fracture 03/16/2017   right  . Bulging lumbar disc    L4 OR L5 TAKES STEROID INJECTIONS FOR TOOK INJECTION MARCH 2019  . Chronic bronchitis ([place]) FEW YEARS AGO  . Chronic hepatitis C ([place])    TOOK HARVONI TX 2017   . Colon polyp   . COPD (chronic obstructive pulmonary disease) ([place])   . Diverticulitis 78YRS AGO  . Dyspnea    WITH HEAVY EXERTION  . Emphysema of lung (HCC)    TOOK PULMONARY FUCTION TEST AND PASSED FEW WEEKS AGO AT FAMILY  MD  . Gallstones   . GERD (gastroesophageal reflux disease)   . History of kidney stones 20 YRS AGO   . Hypertension   . Osteoarthritis   . Pneumonia AT BIRTH  . Spondylosis of cervical region without myelopathy or radiculopathy     Past Surgical History:  Procedure Laterality Date  . ABDOMINAL HYSTERECTOMY  1980   PARTIAL  . ANKLE ARTHROSCOPY Right 10/02/2017   Procedure: RIGHT ANKLE ARTHROSCOPY WITH EXTENSIVE DEBRIDEMENT;  Surgeon: Dorna Leitz, MD;  Location: [place];  Service: Orthopedics;  Laterality: Right;  90 MINUTES FOR CASE  . BREAST BIOPSY  02/07/2010  . BREAST SURGERY  2011   biopsy  . CHOLECYSTECTOMY  1999   LAPAROSCOPIC  . HARDWARE REMOVAL Right 10/02/2017   Procedure: HARDWARE REMOVAL;  Surgeon: Dorna Leitz, MD;  Location: [facility];  Service: Orthopedics;  Laterality: Right;  . ORIF ANKLE FRACTURE Right 03/20/2017   Procedure: OPEN REDUCTION INTERNAL FIXATION (ORIF) ANKLE FRACTURE;  Surgeon: Dorna Leitz, MD;  Location: [place];  Service: Orthopedics;  Laterality: Right;  . TONSILLECTOMY AND ADENOIDECTOMY  1950's    There were no vitals filed for this visit.  Subjective Assessment - 10/31/17 1432    Subjective  "ankle is doing ok"    Pain Score  2     Pain Location  Ankle    Pain Orientation  Right                       [facility] Adult PT Treatment/Exercise - 10/31/17 0001      Vasopneumatic   Number Minutes Vasopneumatic   15 minutes    Vasopnuematic Location   Ankle    Vasopneumatic Pressure  Medium    Vasopneumatic Temperature   32      Manual Therapy   Manual Therapy  Passive ROM    Manual therapy comments  some PROM taken to end range and held    Passive ROM  R ankle all motions       Ankle Exercises:  Aerobic   Elliptical  I=10 R=5 65frd/2rev     Recumbent Bike  L1x 5 min      Ankle Exercises: Seated   Ankle Circles/Pumps  AROM;Right;20 reps    Other Seated Ankle Exercises  Sit to stand from 2 airex on treadmill 2x5     Other Seated Ankle Exercises  4 way ankle PRE's x15 each       Ankle Exercises: Standing   Rebounder  jumping on rebounder. different jump pattern    Heel Raises  Both;20 reps    Other Standing Ankle Exercises  toe walks and heel walks    Other Standing Ankle Exercises  4 flights of stairs. pt lacks DF in R ankle                PT Short Term Goals - 10/24/17 1315      PT SHORT TERM GOAL #1   Title  patient to be independent with initial HEP    Status  New    Target Date  11/07/17        PT Long Term Goals - 10/24/17 1315      PT LONG TERM GOAL #1   Title  patient to be independent with advanced HEP    Status  New    Target Date  12/19/17      PT LONG TERM GOAL #2   Title   patient to improve R ankle AROM to [facility] to allow for improved gait mechanics    Status  New    Target Date  12/19/17      PT LONG TERM GOAL #3   Title  patient to demonstrate good heel strike and appropriate gait mechanics with good stability and minimal deviation    Status  New    Target Date  12/19/17      PT LONG TERM GOAL #4   Title  patient to improve R ankle strength to >/= 4/5    Status  New    Target Date  12/19/17            Plan - 10/31/17 1521    Clinical Impression Statement  Pt progressed to stairs where she exhibited decreased DF on R ankle. Pt tolerated progression to trampoline well however she fatigued easily during thereapeutic exercise.     Rehab Potential  Good    PT Frequency  1x / week    PT Duration  8 weeks    PT Treatment/Interventions  ADLs/Self Care Home Management;Cryotherapy;Electrical Stimulation;Gait training;Balance training;Therapeutic exercise;Therapeutic activities;Functional mobility training;Stair training;Patient/family education;Manual techniques;Vasopneumatic Device;Passive range of motion;Taping    PT Next Visit Plan  R ankle stability, strength and ROM       Patient will benefit from skilled therapeutic intervention in order to improve the following deficits and impairments:  Abnormal gait, Decreased activity tolerance, Decreased balance, Decreased mobility, Decreased strength, Increased edema, Impaired flexibility, Pain, Decreased range of motion, Difficulty walking  Visit Diagnosis: Pain in right ankle and joints of right foot  Stiffness of right ankle, not elsewhere classified  Difficulty in walking, not elsewhere classified  Other abnormalities of gait and mobility  Localized edema     Problem List Patient Active Problem List   Diagnosis Date Noted  . Localized osteoarthritis of right ankle 10/02/2017  . Retained orthopedic hardware 10/02/2017  . Bimalleolar ankle fracture, right, closed, initial encounter 03/20/2017  .  Bimalleolar fracture of right ankle 03/20/2017  . Depression with anxiety 12/25/2011  . HTN (hypertension) 12/25/2011  .  GERD (gastroesophageal reflux disease) 12/25/2011  . COPD, moderate ([place]) 12/25/2011  . Chronic constipation 12/25/2011    Loyal Gambler 10/31/2017, 3:29 PM  [place] [address] [place] [address] [place], [place], [address] Phone: [phone]   Fax:  [phone]  Name: [name] MRN: [id number] Date of Birth: Apr 17, 1946

## 2017-11-04 ENCOUNTER — Ambulatory Visit: Payer: Medicare Other | Admitting: Physical Therapy

## 2017-11-04 ENCOUNTER — Encounter: Payer: Self-pay | Admitting: Physical Therapy

## 2017-11-04 DIAGNOSIS — R262 Difficulty in walking, not elsewhere classified: Secondary | ICD-10-CM

## 2017-11-04 DIAGNOSIS — R2689 Other abnormalities of gait and mobility: Secondary | ICD-10-CM

## 2017-11-04 DIAGNOSIS — M25671 Stiffness of right ankle, not elsewhere classified: Secondary | ICD-10-CM

## 2017-11-04 DIAGNOSIS — M25571 Pain in right ankle and joints of right foot: Secondary | ICD-10-CM

## 2017-11-04 NOTE — Therapy (Signed)
[place], [place], [address] Phone: [phone]   Fax:  [phone]  Physical Therapy Treatment  Patient Details  Name: [name] MRN: [id number] Date of Birth: [birth year] Referring Provider: Dr. Berenice Primas   Encounter Date: 11/04/2017  PT End of Session - 11/04/17 1209    Visit Number  4    Number of Visits  16    Date for PT Re-Evaluation  12/19/17    PT Start Time  1132    PT Stop Time  1226    PT Time Calculation (min)  54 min    Activity Tolerance  Patient tolerated treatment well    Behavior During Therapy  [facility] for tasks assessed/performed       Past Medical History:  Diagnosis Date  . Allergy   . Ankle fracture 03/16/2017   right  . Bulging lumbar disc    L4 OR L5 TAKES STEROID INJECTIONS FOR TOOK INJECTION MARCH 2019  . Chronic bronchitis ([place]) FEW YEARS AGO  . Chronic hepatitis C ([place])    TOOK HARVONI TX 2017   . Colon polyp   . COPD (chronic obstructive pulmonary disease) ([place])   . Diverticulitis 10YRS AGO  . Dyspnea    WITH HEAVY EXERTION  . Emphysema of lung (HCC)    TOOK PULMONARY FUCTION TEST AND PASSED FEW WEEKS AGO AT FAMILY  MD  . Gallstones   . GERD (gastroesophageal reflux disease)   . History of kidney stones 20 YRS AGO   . Hypertension   . Osteoarthritis   . Pneumonia AT BIRTH  . Spondylosis of cervical region without myelopathy or radiculopathy     Past Surgical History:  Procedure Laterality Date  . ABDOMINAL HYSTERECTOMY  1980   PARTIAL  . ANKLE ARTHROSCOPY Right 10/02/2017   Procedure: RIGHT ANKLE ARTHROSCOPY WITH EXTENSIVE DEBRIDEMENT;  Surgeon: Dorna Leitz, MD;  Location: [place];  Service: Orthopedics;  Laterality: Right;  90 MINUTES FOR CASE  . BREAST BIOPSY  02/07/2010  . BREAST SURGERY  2011   biopsy  . CHOLECYSTECTOMY  1999   LAPAROSCOPIC  . HARDWARE REMOVAL Right 10/02/2017   Procedure: HARDWARE REMOVAL;  Surgeon: Dorna Leitz, MD;  Location: [facility];  Service: Orthopedics;  Laterality: Right;  . ORIF ANKLE FRACTURE Right 03/20/2017   Procedure: OPEN REDUCTION INTERNAL FIXATION (ORIF) ANKLE FRACTURE;  Surgeon: Dorna Leitz, MD;  Location: [place];  Service: Orthopedics;  Laterality: Right;  . TONSILLECTOMY AND ADENOIDECTOMY  1950's    There were no vitals filed for this visit.  Subjective Assessment - 11/04/17 1135    Subjective  "It hurts just a tad"    Currently in Pain?  Yes    Pain Score  3     Pain Location  Ankle    Pain Orientation  Right                       [facility] Adult PT Treatment/Exercise - 11/04/17 0001      Manual Therapy   Manual Therapy  Passive ROM    Manual therapy comments  some PROM taken to end range and held    Passive ROM  R ankle all motions       Ankle Exercises: Aerobic   Elliptical  I=10 R=5 10frd/2rev     Recumbent Bike  L1x 5 min      Ankle Exercises: Seated   Other  Seated Ankle Exercises  4 way ankle PRE's x15 each       Ankle Exercises: Standing   Other Standing Ankle Exercises  treadmill push and pull x 30 each     Other Standing Ankle Exercises  2 flights of stairs. pt lacks DF in R ankle       Ankle Exercises: Stretches   Gastroc Stretch  5 reps;10 seconds      Ankle Exercises: Machines for Strengthening   Cybex Leg Press  heel raises RLE 20lb 2x15                PT Short Term Goals - 10/24/17 1315      PT SHORT TERM GOAL #1   Title  patient to be independent with initial HEP    Status  New    Target Date  11/07/17        PT Long Term Goals - 10/24/17 1315      PT LONG TERM GOAL #1   Title  patient to be independent with advanced HEP    Status  New    Target Date  12/19/17      PT LONG TERM GOAL #2   Title  patient to improve R ankle AROM to [facility] to allow for improved gait mechanics    Status  New    Target Date  12/19/17      PT LONG TERM GOAL #3   Title  patient to demonstrate  good heel strike and appropriate gait mechanics with good stability and minimal deviation    Status  New    Target Date  12/19/17      PT LONG TERM GOAL #4   Title  patient to improve R ankle strength to >/= 4/5    Status  New    Target Date  12/19/17            Plan - 11/04/17 1211    Clinical Impression Statement  Swelling noted in R ankle with MT. Decrease R ankle DF causes gait compensation. Pt compensates with stair negotiation. Pt continues to fatigue easily with activity.     Rehab Potential  Good    PT Frequency  1x / week    PT Duration  8 weeks    PT Treatment/Interventions  ADLs/Self Care Home Management;Cryotherapy;Electrical Stimulation;Gait training;Balance training;Therapeutic exercise;Therapeutic activities;Functional mobility training;Stair training;Patient/family education;Manual techniques;Vasopneumatic Device;Passive range of motion;Taping    PT Next Visit Plan  R ankle stability, strength and ROM       Patient will benefit from skilled therapeutic intervention in order to improve the following deficits and impairments:  Abnormal gait, Decreased activity tolerance, Decreased balance, Decreased mobility, Decreased strength, Increased edema, Impaired flexibility, Pain, Decreased range of motion, Difficulty walking  Visit Diagnosis: Stiffness of right ankle, not elsewhere classified  Pain in right ankle and joints of right foot  Difficulty in walking, not elsewhere classified  Other abnormalities of gait and mobility     Problem List Patient Active Problem List   Diagnosis Date Noted  . Localized osteoarthritis of right ankle 10/02/2017  . Retained orthopedic hardware 10/02/2017  . Bimalleolar ankle fracture, right, closed, initial encounter 03/20/2017  . Bimalleolar fracture of right ankle 03/20/2017  . Depression with anxiety 12/25/2011  . HTN (hypertension) 12/25/2011  . GERD (gastroesophageal reflux disease) 12/25/2011  . COPD, moderate ([place])  12/25/2011  . Chronic constipation 12/25/2011    Scot Jun, PTA 11/04/2017, 12:14 PM  [place]  [address] [place] [address] [place], [place], [address] Phone: [phone]   Fax:  [phone]  Name: [name] MRN: [id number] Date of Birth: January 30, 1946

## 2017-11-06 ENCOUNTER — Encounter: Payer: Self-pay | Admitting: Physical Therapy

## 2017-11-06 ENCOUNTER — Ambulatory Visit: Payer: Medicare Other | Admitting: Physical Therapy

## 2017-11-06 DIAGNOSIS — M25571 Pain in right ankle and joints of right foot: Secondary | ICD-10-CM

## 2017-11-06 DIAGNOSIS — M25671 Stiffness of right ankle, not elsewhere classified: Secondary | ICD-10-CM

## 2017-11-06 DIAGNOSIS — R262 Difficulty in walking, not elsewhere classified: Secondary | ICD-10-CM

## 2017-11-06 NOTE — Therapy (Signed)
[place] [address] [place], [place], [address] Phone: [phone]   Fax:  [phone]  Physical Therapy Treatment  Patient Details  Name: [name] MRN: [id number] Date of Birth: 1946-02-28 Referring Provider: Dr. Berenice Primas   Encounter Date: 11/06/2017  PT End of Session - 11/06/17 1136    Visit Number  5    Number of Visits  16    Date for PT Re-Evaluation  12/19/17    PT Start Time  1055    PT Stop Time  1150    PT Time Calculation (min)  55 min    Activity Tolerance  Patient tolerated treatment well    Behavior During Therapy  [facility] for tasks assessed/performed       Past Medical History:  Diagnosis Date  . Allergy   . Ankle fracture 03/16/2017   right  . Bulging lumbar disc    L4 OR L5 TAKES STEROID INJECTIONS FOR TOOK INJECTION MARCH 2019  . Chronic bronchitis ([place]) FEW YEARS AGO  . Chronic hepatitis C ([place])    TOOK [place] TX 2017   . Colon polyp   . COPD (chronic obstructive pulmonary disease) ([place])   . Diverticulitis 49YRS AGO  . Dyspnea    WITH HEAVY EXERTION  . Emphysema of lung ([facility])    TOOK PULMONARY FUCTION TEST AND PASSED FEW WEEKS AGO AT FAMILY  MD  . Gallstones   . GERD (gastroesophageal reflux disease)   . History of kidney stones 20 YRS AGO   . Hypertension   . Osteoarthritis   . Pneumonia AT BIRTH  . Spondylosis of cervical region without myelopathy or radiculopathy     Past Surgical History:  Procedure Laterality Date  . ABDOMINAL HYSTERECTOMY  1980   PARTIAL  . ANKLE ARTHROSCOPY Right 10/02/2017   Procedure: RIGHT ANKLE ARTHROSCOPY WITH EXTENSIVE DEBRIDEMENT;  Surgeon: Dorna Leitz, MD;  Location: [facility];  Service: Orthopedics;  Laterality: Right;  90 MINUTES FOR CASE  . BREAST BIOPSY  02/07/2010  . BREAST SURGERY  2011   biopsy  . CHOLECYSTECTOMY  1999   LAPAROSCOPIC  . HARDWARE REMOVAL Right 10/02/2017   Procedure: HARDWARE REMOVAL;  Surgeon: Dorna Leitz, MD;  Location: [facility];  Service: Orthopedics;  Laterality: Right;  . ORIF ANKLE FRACTURE Right 03/20/2017   Procedure: OPEN REDUCTION INTERNAL FIXATION (ORIF) ANKLE FRACTURE;  Surgeon: Dorna Leitz, MD;  Location: [facility];  Service: Orthopedics;  Laterality: Right;  . TONSILLECTOMY AND ADENOIDECTOMY  1950's    There were no vitals filed for this visit.  Subjective Assessment - 11/06/17 1057    Subjective  "I have been stretching"    Currently in Pain?  Yes    Pain Score  4     Pain Location  Ankle    Pain Orientation  Right                       [facility] Adult PT Treatment/Exercise - 11/06/17 0001      Vasopneumatic   Number Minutes Vasopneumatic   15 minutes    Vasopnuematic Location   Ankle    Vasopneumatic Pressure  Medium    Vasopneumatic Temperature   32      Ankle Exercises: Aerobic   Elliptical  I=10 R=5 73frd/10rev     Recumbent Bike  L1x 5 min      Ankle Exercises: Standing   Other Standing Ankle Exercises  treadmill push and pull x 30 each     Other Standing Ankle Exercises  3 flights of stairs. pt lacks DF in R ankle lands hard on LLE      Ankle Exercises: Stretches   Gastroc Stretch  5 reps;10 seconds      Ankle Exercises: Machines for Strengthening   Cybex Leg Press  30lb 2x10, heel raises 30lb RLE 2x10                PT Short Term Goals - 10/24/17 1315      PT SHORT TERM GOAL #1   Title  patient to be independent with initial HEP    Status  New    Target Date  11/07/17        PT Long Term Goals - 10/24/17 1315      PT LONG TERM GOAL #1   Title  patient to be independent with advanced HEP    Status  New    Target Date  12/19/17      PT LONG TERM GOAL #2   Title  patient to improve R ankle AROM to [facility] to allow for improved gait mechanics    Status  New    Target Date  12/19/17      PT LONG TERM GOAL #3   Title  patient to demonstrate good heel strike and appropriate gait  mechanics with good stability and minimal deviation    Status  New    Target Date  12/19/17      PT LONG TERM GOAL #4   Title  patient to improve R ankle strength to >/= 4/5    Status  New    Target Date  12/19/17            Plan - 11/06/17 1138    Clinical Impression Statement  Pt continues to have some swelling and decrease R ankle dorsiflexion. Decrease dorsiflexion attributes to gait and stair negotiation compensation. Cues to lean forward to create more of a stretch  during gastro stretch. Pt fatigues quick with activity requiring frequent rest breaks.     Rehab Potential  Good    PT Frequency  1x / week    PT Duration  8 weeks    PT Treatment/Interventions  ADLs/Self Care Home Management;Cryotherapy;Electrical Stimulation;Gait training;Balance training;Therapeutic exercise;Therapeutic activities;Functional mobility training;Stair training;Patient/family education;Manual techniques;Vasopneumatic Device;Passive range of motion;Taping    PT Next Visit Plan  R ankle stability, strength and ROM       Patient will benefit from skilled therapeutic intervention in order to improve the following deficits and impairments:  Abnormal gait, Decreased activity tolerance, Decreased balance, Decreased mobility, Decreased strength, Increased edema, Impaired flexibility, Pain, Decreased range of motion, Difficulty walking  Visit Diagnosis: Stiffness of right ankle, not elsewhere classified  Pain in right ankle and joints of right foot  Difficulty in walking, not elsewhere classified     Problem List Patient Active Problem List   Diagnosis Date Noted  . Localized osteoarthritis of right ankle 10/02/2017  . Retained orthopedic hardware 10/02/2017  . Bimalleolar ankle fracture, right, closed, initial encounter 03/20/2017  . Bimalleolar fracture of right ankle 03/20/2017  . Depression with anxiety 12/25/2011  . HTN (hypertension) 12/25/2011  . GERD (gastroesophageal reflux disease)  12/25/2011  . COPD, moderate ([place]) 12/25/2011  . Chronic constipation 12/25/2011    Scot Jun , PTA 11/06/2017, 11:43 AM  [place], [place], [address]  Phone: [phone]   Fax:  [phone]  Name: Kameela Leipold MRN: [id number] Date of Birth: 1946-06-08

## 2017-11-12 ENCOUNTER — Encounter: Payer: Self-pay | Admitting: Physical Therapy

## 2017-11-12 ENCOUNTER — Ambulatory Visit: Payer: Medicare Other | Admitting: Physical Therapy

## 2017-11-12 DIAGNOSIS — M25571 Pain in right ankle and joints of right foot: Secondary | ICD-10-CM | POA: Diagnosis not present

## 2017-11-12 DIAGNOSIS — R2689 Other abnormalities of gait and mobility: Secondary | ICD-10-CM

## 2017-11-12 DIAGNOSIS — R262 Difficulty in walking, not elsewhere classified: Secondary | ICD-10-CM

## 2017-11-12 DIAGNOSIS — M25671 Stiffness of right ankle, not elsewhere classified: Secondary | ICD-10-CM

## 2017-11-12 NOTE — Therapy (Signed)
[place], [place], [address] Phone: [phone]   Fax:  [phone]  Physical Therapy Treatment  Patient Details  Name: [name] MRN: [id number] Date of Birth: [birth year] Referring Provider: Dr. Berenice Primas   Encounter Date: 11/12/2017  PT End of Session - 11/12/17 1231    Visit Number  6    Number of Visits  16    Date for PT Re-Evaluation  12/19/17    PT Start Time  5056    PT Stop Time  9794    PT Time Calculation (min)  61 min       Past Medical History:  Diagnosis Date  . Allergy   . Ankle fracture 03/16/2017   right  . Bulging lumbar disc    L4 OR L5 TAKES STEROID INJECTIONS FOR TOOK INJECTION MARCH 2019  . Chronic bronchitis ([place]) FEW YEARS AGO  . Chronic hepatitis C ([place])    TOOK HARVONI TX 2017   . Colon polyp   . COPD (chronic obstructive pulmonary disease) ([place])   . Diverticulitis 85YRS AGO  . Dyspnea    WITH HEAVY EXERTION  . Emphysema of lung (HCC)    TOOK PULMONARY FUCTION TEST AND PASSED FEW WEEKS AGO AT FAMILY  MD  . Gallstones   . GERD (gastroesophageal reflux disease)   . History of kidney stones 20 YRS AGO   . Hypertension   . Osteoarthritis   . Pneumonia AT BIRTH  . Spondylosis of cervical region without myelopathy or radiculopathy     Past Surgical History:  Procedure Laterality Date  . ABDOMINAL HYSTERECTOMY  1980   PARTIAL  . ANKLE ARTHROSCOPY Right 10/02/2017   Procedure: RIGHT ANKLE ARTHROSCOPY WITH EXTENSIVE DEBRIDEMENT;  Surgeon: Dorna Leitz, MD;  Location: [facility];  Service: Orthopedics;  Laterality: Right;  90 MINUTES FOR CASE  . BREAST BIOPSY  02/07/2010  . BREAST SURGERY  2011   biopsy  . CHOLECYSTECTOMY  1999   LAPAROSCOPIC  . HARDWARE REMOVAL Right 10/02/2017   Procedure: HARDWARE REMOVAL;  Surgeon: Dorna Leitz, MD;  Location[facility];  Service: Orthopedics;  Laterality: Right;  . ORIF ANKLE FRACTURE  Right 03/20/2017   Procedure: OPEN REDUCTION INTERNAL FIXATION (ORIF) ANKLE FRACTURE;  Surgeon: Dorna Leitz, MD;  Location: [facility];  Service: Orthopedics;  Laterality: Right;  . TONSILLECTOMY AND ADENOIDECTOMY  1950's    There were no vitals filed for this visit.  Subjective Assessment - 11/12/17 1154    Subjective  "Ok"  Went to [place] he put her on anti inflammatory    Currently in Pain?  No/denies    Pain Score  0-No pain         [facility] PT Assessment - 11/12/17 0001      AROM   Right/Left Ankle  Right    Right Ankle Dorsiflexion  2    Right Ankle Plantar Flexion  53    Right Ankle Inversion  35    Right Ankle Eversion  20                   [facility] Adult PT Treatment/Exercise - 11/12/17 0001      Vasopneumatic   Number Minutes Vasopneumatic   15 minutes    Vasopnuematic Location   Ankle    Vasopneumatic Pressure  Medium    Vasopneumatic Temperature   32      Manual Therapy   Manual Therapy  Passive ROM    Manual therapy comments  some PROM taken to end range and held    Passive ROM  R ankle all motions       Ankle Exercises: Aerobic   Elliptical  I=10 R=5 81fd/4ev     Recumbent Bike  L1x 5 min      Ankle Exercises: Seated   Other Seated Ankle Exercises  Sit to stand from 2 airex on treadmill 2x5     Other Seated Ankle Exercises  4 way ankle PRE's x15 each       Ankle Exercises: Standing   Heel Raises  Both;20 reps on airex    Toe Raise  20 reps;2 seconds on airex     Other Standing Ankle Exercises  toe and heel walking    Other Standing Ankle Exercises  3 flights of stairs. pt lacks DF in R ankle lands hard on LLE               PT Short Term Goals - 10/24/17 1315      PT SHORT TERM GOAL #1   Title  patient to be independent with initial HEP    Status  New    Target Date  11/07/17        PT Long Term Goals - 11/12/17 1233      PT LONG TERM GOAL #1   Title  patient to be independent with advanced HEP    Status   Achieved      PT LONG TERM GOAL #2   Title  patient to [facility]    Status  On-going      PT LONG TERM GOAL #3   Title  patient to demonstrate good heel strike and appropriate gait mechanics with good stability and minimal deviation    Status  Partially Met      PT LONG TERM GOAL #4   Title  patient to improve R ankle strength to >/= 4/5    Status  On-going            Plan - 11/12/17 1233    Clinical Impression Statement  Pt with improvement with all ankle motions. She reports improve in her mobility overall. She is still limited in ankle dorsiflexion with a firm end feel noted with MT. Stated that she is doing her stretches at home.    Rehab Potential  Good    PT Frequency  1x / week    PT Duration  8 weeks    PT Treatment/Interventions  ADLs/Self Care Home Management;Cryotherapy;Electrical Stimulation;Gait training;Balance training;Therapeutic exercise;Therapeutic activities;Functional mobility training;Stair training;Patient/family education;Manual techniques;Vasopneumatic Device;Passive range of motion;Taping    PT Next Visit Plan  R ankle stability, strength and ROM       Patient will benefit from skilled therapeutic intervention in order to improve the following deficits and impairments:  Abnormal gait, Decreased activity tolerance, Decreased balance, Decreased mobility, Decreased strength, Increased edema, Impaired flexibility, Pain, Decreased range of motion, Difficulty walking  Visit Diagnosis: Stiffness of right ankle, not elsewhere classified  Pain in right ankle and joints of right foot  Difficulty in walking, not elsewhere classified  Other abnormalities of gait and mobility     Problem List Patient Active Problem List   Diagnosis Date Noted  . Localized osteoarthritis of right ankle 10/02/2017  . Retained orthopedic hardware 10/02/2017  . Bimalleolar ankle fracture, right, closed, initial encounter  03/20/2017  . Bimalleolar fracture of right  ankle 03/20/2017  . Depression with anxiety 12/25/2011  . HTN (hypertension) 12/25/2011  . GERD (gastroesophageal reflux disease) 12/25/2011  . COPD, moderate ([place]) 12/25/2011  . Chronic constipation 12/25/2011    Scot Jun, PTA 11/12/2017, 12:37 PM  [place], [place], [address] Phone: [phone]   Fax:  [phone]  Name: [name] MRN: [id number] Date of Birth: 11/07/1945

## 2017-11-14 ENCOUNTER — Ambulatory Visit: Payer: Medicare Other | Admitting: Physical Therapy

## 2017-11-14 ENCOUNTER — Encounter: Payer: Self-pay | Admitting: Physical Therapy

## 2017-11-14 DIAGNOSIS — R2689 Other abnormalities of gait and mobility: Secondary | ICD-10-CM

## 2017-11-14 DIAGNOSIS — M25571 Pain in right ankle and joints of right foot: Secondary | ICD-10-CM

## 2017-11-14 DIAGNOSIS — M25671 Stiffness of right ankle, not elsewhere classified: Secondary | ICD-10-CM

## 2017-11-14 DIAGNOSIS — R262 Difficulty in walking, not elsewhere classified: Secondary | ICD-10-CM

## 2017-11-14 DIAGNOSIS — R600 Localized edema: Secondary | ICD-10-CM

## 2017-11-14 NOTE — Therapy (Signed)
[place], [place], [address] Phone: [phone]   Fax:  [phone]  Physical Therapy Treatment  Patient Details  Name: [name] MRN: [id number] Date of Birth: 09-14-1945 Referring Provider: Dr. Berenice Primas   Encounter Date: 11/14/2017  PT End of Session - 11/14/17 1220    Visit Number  7    Number of Visits  16    Date for PT Re-Evaluation  12/19/17    PT Start Time  9924    PT Stop Time  1232    PT Time Calculation (min)  50 min    Activity Tolerance  Patient tolerated treatment well    Behavior During Therapy  [facility] for tasks assessed/performed       Past Medical History:  Diagnosis Date  . Allergy   . Ankle fracture 03/16/2017   right  . Bulging lumbar disc    L4 OR L5 TAKES STEROID INJECTIONS FOR TOOK INJECTION MARCH 2019  . Chronic bronchitis ([place]) FEW YEARS AGO  . Chronic hepatitis C ([place])    TOOK HARVONI TX 2017   . Colon polyp   . COPD (chronic obstructive pulmonary disease) ([place])   . Diverticulitis 50YRS AGO  . Dyspnea    WITH HEAVY EXERTION  . Emphysema of lung (HCC)    TOOK PULMONARY FUCTION TEST AND PASSED FEW WEEKS AGO AT FAMILY  MD  . Gallstones   . GERD (gastroesophageal reflux disease)   . History of kidney stones 20 YRS AGO   . Hypertension   . Osteoarthritis   . Pneumonia AT BIRTH  . Spondylosis of cervical region without myelopathy or radiculopathy     Past Surgical History:  Procedure Laterality Date  . ABDOMINAL HYSTERECTOMY  1980   PARTIAL  . ANKLE ARTHROSCOPY Right 10/02/2017   Procedure: RIGHT ANKLE ARTHROSCOPY WITH EXTENSIVE DEBRIDEMENT;  Surgeon: Dorna Leitz, MD;  Location: [facility];  Service: Orthopedics;  Laterality: Right;  90 MINUTES FOR CASE  . BREAST BIOPSY  02/07/2010  . BREAST SURGERY  2011   biopsy  . CHOLECYSTECTOMY  1999   LAPAROSCOPIC  . HARDWARE REMOVAL Right 10/02/2017   Procedure: HARDWARE REMOVAL;  Surgeon: Dorna Leitz, MD;  Location: [facility];  Service: Orthopedics;  Laterality: Right;  . ORIF ANKLE FRACTURE Right 03/20/2017   Procedure: OPEN REDUCTION INTERNAL FIXATION (ORIF) ANKLE FRACTURE;  Surgeon: Dorna Leitz, MD;  Location: [facility];  Service: Orthopedics;  Laterality: Right;  . TONSILLECTOMY AND ADENOIDECTOMY  1950's    There were no vitals filed for this visit.  Subjective Assessment - 11/14/17 1150    Subjective  "Tired"    Currently in Pain?  Yes    Pain Score  3     Pain Location  Ankle    Pain Orientation  Right                       [facility] Adult PT Treatment/Exercise - 11/14/17 0001      Ambulation/Gait   Gait Comments  3 flight of stairs,  Pt lands hard on LLE when stepping down due to decrease R ankle DF      Vasopneumatic   Number Minutes Vasopneumatic   15 minutes    Vasopnuematic Location   Ankle    Vasopneumatic Pressure  Medium    Vasopneumatic Temperature   32      Manual Therapy   Manual Therapy  Passive ROM    Manual therapy comments  some PROM taken to end range and held    Passive ROM  R ankle all motions       Ankle Exercises: Aerobic   Elliptical  I=10 R=5 46fd/3ev       Ankle Exercises: Standing   Other Standing Ankle Exercises  treadmill push and pull x 30 each     Other Standing Ankle Exercises  resisted gait 40 lb x5 front and back      Ankle Exercises: Machines for Strengthening   Cybex Leg Press  30lb 2x10, heel raises 30lb RLE 2x10                PT Short Term Goals - 10/24/17 1315      PT SHORT TERM GOAL #1   Title  patient to be independent with initial HEP    Status  New    Target Date  11/07/17        PT Long Term Goals - 11/12/17 1233      PT LONG TERM GOAL #1   Title  patient to be independent with advanced HEP    Status  Achieved      PT LONG TERM GOAL #2   Title  patient to improve R ankle [facility] allow for improved gait mechanics    Status  On-going       PT LONG TERM GOAL #3   Title  patient to demonstrate good heel strike and appropriate gait mechanics with good stability and minimal deviation    Status  Partially Met      PT LONG TERM GOAL #4   Title  patient to improve R ankle strength to >/= 4/5    Status  On-going            Plan - 11/14/17 1222    Clinical Impression Statement  Pt ~ to have an improved ability to descend stairs. She does not land as hard on LLE. Cues to articulate ankle with treadmill pushed and resisted gait. Guarded a little with MT requiring cues to relax. She continues to do her stretched as home.    Rehab Potential  Good    PT Frequency  1x / week    PT Duration  8 weeks    PT Treatment/Interventions  ADLs/Self Care Home Management;Cryotherapy;Electrical Stimulation;Gait training;Balance training;Therapeutic exercise;Therapeutic activities;Functional mobility training;Stair training;Patient/family education;Manual techniques;Vasopneumatic Device;Passive range of motion;Taping    PT Next Visit Plan  R ankle stability, strength and ROM       Patient will benefit from skilled therapeutic intervention in order to improve the following deficits and impairments:  Abnormal gait, Decreased activity tolerance, Decreased balance, Decreased mobility, Decreased strength, Increased edema, Impaired flexibility, Pain, Decreased range of motion, Difficulty walking  Visit Diagnosis: Stiffness of right ankle, not elsewhere classified  Pain in right ankle and joints of right foot  Difficulty in walking, not elsewhere classified  Other abnormalities of gait and mobility  Localized edema     Problem List Patient Active Problem List   Diagnosis Date Noted  . Localized osteoarthritis of right ankle 10/02/2017  . Retained orthopedic hardware 10/02/2017  . Bimalleolar ankle fracture, right, closed, initial encounter 03/20/2017  . Bimalleolar fracture of right ankle 03/20/2017  . Depression with anxiety 12/25/2011   . HTN (hypertension) 12/25/2011  . GERD (gastroesophageal reflux disease) 12/25/2011  . [place] 12/25/2011  . Chronic constipation 12/25/2011    RScot Jun PTA 11/14/2017,  12:24 PM  [place] [address] [place], [place], [address] Phone: [phone]   Fax:  [phone]  Name: [name] Hipp MRN: [id number] Date of Birth: [birth year]

## 2017-11-19 ENCOUNTER — Encounter: Payer: Self-pay | Admitting: Physical Therapy

## 2017-11-19 ENCOUNTER — Ambulatory Visit: Payer: Medicare Other | Admitting: Physical Therapy

## 2017-11-19 DIAGNOSIS — R262 Difficulty in walking, not elsewhere classified: Secondary | ICD-10-CM

## 2017-11-19 DIAGNOSIS — M25571 Pain in right ankle and joints of right foot: Secondary | ICD-10-CM | POA: Diagnosis not present

## 2017-11-19 DIAGNOSIS — M25671 Stiffness of right ankle, not elsewhere classified: Secondary | ICD-10-CM

## 2017-11-19 NOTE — Therapy (Signed)
[name], [place], [address] Phone: [phone]   Fax:  [phone]  Physical Therapy Treatment  Patient Details  Name: [name] MRN: [id number] Date of Birth: March 24, 1946 Referring Provider: Dr. Berenice Primas   Encounter Date: 11/19/2017  PT End of Session - 11/19/17 1149    Visit Number  8    Date for PT Re-Evaluation  12/19/17    PT Start Time  1105    PT Stop Time  1205    PT Time Calculation (min)  60 min    Activity Tolerance  Patient tolerated treatment well    Behavior During Therapy  [facility] for tasks assessed/performed       Past Medical History:  Diagnosis Date  . Allergy   . Ankle fracture 03/16/2017   right  . Bulging lumbar disc    L4 OR L5 TAKES STEROID INJECTIONS FOR TOOK INJECTION MARCH 2019  . Chronic bronchitis ([place]) FEW YEARS AGO  . Chronic hepatitis C ([place])    TOOK HARVONI TX 2017   . Colon polyp   . COPD (chronic obstructive pulmonary disease) ([place])   . Diverticulitis 4YRS AGO  . Dyspnea    WITH HEAVY EXERTION  . Emphysema of lung (H[facility])    TOOK PULMONARY FUCTION TEST AND PASSED FEW WEEKS AGO AT FAMILY  MD  . Gallstones   . GERD (gastroesophageal reflux disease)   . History of kidney stones 20 YRS AGO   . Hypertension   . Osteoarthritis   . Pneumonia AT BIRTH  . Spondylosis of cervical region without myelopathy or radiculopathy     Past Surgical History:  Procedure Laterality Date  . ABDOMINAL HYSTERECTOMY  1980   PARTIAL  . ANKLE ARTHROSCOPY Right 10/02/2017   Procedure: RIGHT ANKLE ARTHROSCOPY WITH EXTENSIVE DEBRIDEMENT;  Surgeon: Dorna Leitz, MD;  Location: [place];  Service: Orthopedics;  Laterality: Right;  90 MINUTES FOR CASE  . BREAST BIOPSY  02/07/2010  . BREAST SURGERY  2011   biopsy  . CHOLECYSTECTOMY  1999   LAPAROSCOPIC  . HARDWARE REMOVAL Right 10/02/2017   Procedure: HARDWARE REMOVAL;  Surgeon: Dorna Leitz, MD;  Location:  [facility];  Service: Orthopedics;  Laterality: Right;  . ORIF ANKLE FRACTURE Right 03/20/2017   Procedure: OPEN REDUCTION INTERNAL FIXATION (ORIF) ANKLE FRACTURE;  Surgeon: Dorna Leitz, MD;  Location: [facility];  Service: Orthopedics;  Laterality: Right;  . TONSILLECTOMY AND ADENOIDECTOMY  1950's    There were no vitals filed for this visit.  Subjective Assessment - 11/19/17 1105    Subjective  "I hurt" "It started Saturday, Sunday it was ok, it started again Monday" Pt reports that she has been applying ice    Currently in Pain?  Yes    Pain Score  4     Pain Location  Ankle    Pain Orientation  Right                       [facility] Adult PT Treatment/Exercise - 11/19/17 0001      Ambulation/Gait   Gait Comments  3 flight of stairs,  Pt lands hard on LLE when stepping down due to decrease R ankle DF      Manual Therapy   Manual Therapy  Passive ROM    Manual therapy comments  some PROM taken to end range and held    Passive ROM  R ankle all  motions       Ankle Exercises: Aerobic   Elliptical  I=10 R=5 37fd/2ev     Recumbent Bike  L1x 5 min      Ankle Exercises: Standing   Other Standing Ankle Exercises  Heel raises black bar 2x15; toe and heel walking    Other Standing Ankle Exercises  resisted gait 30 lb x5 front and back      Ankle Exercises: Machines for Strengthening   Cybex Leg Press  40lb 2x15; RLE 20lb 2x10        Ankle Exercises: Stretches   Gastroc Stretch  30 seconds;2 reps      Ankle Exercises: Seated   Other Seated Ankle Exercises  4 way ankle PRE's x15 each                PT Short Term Goals - 10/24/17 1315      PT SHORT TERM GOAL #1   Title  patient to be independent with initial HEP    Status  New    Target Date  11/07/17        PT Long Term Goals - 11/12/17 1233      PT LONG TERM GOAL #1   Title  patient to be independent with advanced HEP    Status  Achieved      PT LONG TERM GOAL #2    Title  patient to improve R ankle [facility] to [facility] allow for improved gait mechanics    Status  On-going      PT LONG TERM GOAL #3   Title  patient to demonstrate good heel strike and appropriate gait mechanics with good stability and minimal deviation    Status  Partially Met      PT LONG TERM GOAL #4   Title  patient to improve R ankle strength to >/= 4/5    Status  On-going            Plan - 11/19/17 1151    Clinical Impression Statement  Pt enters clinic reporting increase pain that has been off ands on since Saturday. She thinks it comes from wearing sandals. She does reports increase soreness during MT. No increase pain reported afterwards with the exercises. She still compensates overall due to decrease R ankle DF    Rehab Potential  Good    PT Frequency  1x / week    PT Duration  8 weeks    PT Treatment/Interventions  ADLs/Self Care Home Management;Cryotherapy;Electrical Stimulation;Gait training;Balance training;Therapeutic exercise;Therapeutic activities;Functional mobility training;Stair training;Patient/family education;Manual techniques;Vasopneumatic Device;Passive range of motion;Taping    PT Next Visit Plan  R ankle stability, strength and ROM       Patient will benefit from skilled therapeutic intervention in order to improve the following deficits and impairments:  Abnormal gait, Decreased activity tolerance, Decreased balance, Decreased mobility, Decreased strength, Increased edema, Impaired flexibility, Pain, Decreased range of motion, Difficulty walking  Visit Diagnosis: Stiffness of right ankle, not elsewhere classified  Pain in right ankle and joints of right foot  Difficulty in walking, not elsewhere classified     Problem List Patient Active Problem List   Diagnosis Date Noted  . Localized osteoarthritis of right ankle 10/02/2017  . Retained orthopedic hardware 10/02/2017  . Bimalleolar ankle fracture, right, closed, initial encounter 03/20/2017  .  Bimalleolar fracture of right ankle 03/20/2017  . Depression with anxiety 12/25/2011  . HTN (hypertension) 12/25/2011  . GERD (gastroesophageal reflux disease) 12/25/2011  . COPD, moderate ([place] 12/25/2011  .  Chronic constipation 12/25/2011    Scot Jun, PTA 11/19/2017, 11:54 AM  [place], [place], [address] Phone: [phone]   Fax:  [phone]  Name: Meha Vidrine MRN: [id number] Date of Birth: June 23, 1946

## 2017-11-21 ENCOUNTER — Ambulatory Visit: Payer: Medicare Other | Admitting: Physical Therapy

## 2017-11-21 ENCOUNTER — Encounter: Payer: Self-pay | Admitting: Physical Therapy

## 2017-11-21 DIAGNOSIS — M25571 Pain in right ankle and joints of right foot: Secondary | ICD-10-CM

## 2017-11-21 DIAGNOSIS — R262 Difficulty in walking, not elsewhere classified: Secondary | ICD-10-CM

## 2017-11-21 DIAGNOSIS — M19071 Primary osteoarthritis, right ankle and foot: Secondary | ICD-10-CM

## 2017-11-21 DIAGNOSIS — M25671 Stiffness of right ankle, not elsewhere classified: Secondary | ICD-10-CM

## 2017-11-21 DIAGNOSIS — R2689 Other abnormalities of gait and mobility: Secondary | ICD-10-CM

## 2017-11-21 DIAGNOSIS — R600 Localized edema: Secondary | ICD-10-CM

## 2017-11-21 NOTE — Therapy (Signed)
[place], [place], [address] Phone: [phone]   Fax:  [phone]  Physical Therapy Treatment  Patient Details  Name: [name] MRN: [id number] Date of Birth: Feb 22, 1946 Referring Provider: Dr. Berenice Primas   Encounter Date: 11/21/2017  PT End of Session - 11/21/17 1510    Visit Number  9    Date for PT Re-Evaluation  12/19/17    PT Start Time  1425    PT Stop Time  1526    PT Time Calculation (min)  61 min    Activity Tolerance  Patient tolerated treatment well    Behavior During Therapy  [facility] for tasks assessed/performed       Past Medical History:  Diagnosis Date  . Allergy   . Ankle fracture 03/16/2017   right  . Bulging lumbar disc    L4 OR L5 TAKES STEROID INJECTIONS FOR TOOK INJECTION MARCH 2019  . Chronic bronchitis ([place]) FEW YEARS AGO  . Chronic hepatitis C ([place])    TOOK HARVONI TX 2017   . Colon polyp   . COPD (chronic obstructive pulmonary disease) ([place])   . Diverticulitis 37YRS AGO  . Dyspnea    WITH HEAVY EXERTION  . Emphysema of lung (HCC)    TOOK PULMONARY FUCTION TEST AND PASSED FEW WEEKS AGO AT FAMILY  MD  . Gallstones   . GERD (gastroesophageal reflux disease)   . History of kidney stones 20 YRS AGO   . Hypertension   . Osteoarthritis   . Pneumonia AT BIRTH  . Spondylosis of cervical region without myelopathy or radiculopathy     Past Surgical History:  Procedure Laterality Date  . ABDOMINAL HYSTERECTOMY  1980   PARTIAL  . ANKLE ARTHROSCOPY Right 10/02/2017   Procedure: RIGHT ANKLE ARTHROSCOPY WITH EXTENSIVE DEBRIDEMENT;  Surgeon: Dorna Leitz, MD;  Location: [facility];  Service: Orthopedics;  Laterality: Right;  90 MINUTES FOR CASE  . BREAST BIOPSY  02/07/2010  . BREAST SURGERY  2011   biopsy  . CHOLECYSTECTOMY  1999   LAPAROSCOPIC  . HARDWARE REMOVAL Right 10/02/2017   Procedure: HARDWARE REMOVAL;  Surgeon: Dorna Leitz, MD;  Location:  [facility];  Service: Orthopedics;  Laterality: Right;  . ORIF ANKLE FRACTURE Right 03/20/2017   Procedure: OPEN REDUCTION INTERNAL FIXATION (ORIF) ANKLE FRACTURE;  Surgeon: Dorna Leitz, MD;  Location: [facility];  Service: Orthopedics;  Laterality: Right;  . TONSILLECTOMY AND ADENOIDECTOMY  1950's    There were no vitals filed for this visit.  Subjective Assessment - 11/21/17 1428    Subjective  "It still hurts"    Currently in Pain?  Yes    Pain Score  4     Pain Location  Ankle    Pain Orientation  Right         [facility] PT Assessment - 11/21/17 0001      AROM   Right/Left Ankle  Right    Right Ankle Dorsiflexion  4    Right Ankle Plantar Flexion  60    Right Ankle Inversion  39    Right Ankle Eversion  30                   [facility] Adult PT Treatment/Exercise - 11/21/17 0001      Ambulation/Gait   Gait Comments  3 flight of stairs,  Pt lands hard on LLE when stepping down due to decrease R ankle DF  Vasopneumatic   Number Minutes Vasopneumatic   15 minutes    Vasopnuematic Location   Ankle    Vasopneumatic Pressure  Medium    Vasopneumatic Temperature   32      Manual Therapy   Manual Therapy  Passive ROM    Manual therapy comments  some PROM taken to end range and held    Passive ROM  R ankle all motions       Ankle Exercises: Aerobic   Elliptical  I=13 R=7 36fd/3ev     Recumbent Bike  L1x 4 min      Ankle Exercises: Seated   Other Seated Ankle Exercises  4 way ankle PRE's x20 each       Ankle Exercises: Standing   Other Standing Ankle Exercises  Heel raises black bar 2x15; toe and heel walking    Other Standing Ankle Exercises  resisted gait in hall       Ankle Exercises: Stretches   Gastroc Stretch  20 seconds;4 reps               PT Short Term Goals - 10/24/17 1315      PT SHORT TERM GOAL #1   Title  patient to be independent with initial HEP    Status  New    Target Date  11/07/17        PT  Long Term Goals - 11/12/17 1233      PT LONG TERM GOAL #1   Title  patient to be independent with advanced HEP    Status  Achieved      PT LONG TERM GOAL #2   Title  patient to improve R ankle AROM to [facility] allow for improved gait mechanics    Status  On-going      PT LONG TERM GOAL #3   Title  patient to demonstrate good heel strike and appropriate gait mechanics with good stability and minimal deviation    Status  Partially Met      PT LONG TERM GOAL #4   Title  patient to improve R ankle strength to >/= 4/5    Status  On-going            Plan - 11/21/17 1517    Clinical Impression Statement  Pt R ankle has improved some but not much with DF. Pt reports that her ankle still hurts but not as bad. She has increase pain when descending stairs. She reports that the pain changed based on the surface she walks on. She is able to do all activities but compensated due to lack of R ankle DF.     Rehab Potential  Good    PT Frequency  1x / week    PT Duration  8 weeks    PT Treatment/Interventions  ADLs/Self Care Home Management;Cryotherapy;Electrical Stimulation;Gait training;Balance training;Therapeutic exercise;Therapeutic activities;Functional mobility training;Stair training;Patient/family education;Manual techniques;Vasopneumatic Device;Passive range of motion;Taping    PT Next Visit Plan  R ankle stability, strength and ROM       Patient will benefit from skilled therapeutic intervention in order to improve the following deficits and impairments:  Abnormal gait, Decreased activity tolerance, Decreased balance, Decreased mobility, Decreased strength, Increased edema, Impaired flexibility, Pain, Decreased range of motion, Difficulty walking  Visit Diagnosis: Stiffness of right ankle, not elsewhere classified  Localized osteoarthritis of right ankle  Pain in right ankle and joints of right foot  Difficulty in walking, not elsewhere classified  Other abnormalities of gait  and mobility  Localized edema     Problem List Patient Active Problem List   Diagnosis Date Noted  . Localized osteoarthritis of right ankle 10/02/2017  . Retained orthopedic hardware 10/02/2017  . Bimalleolar ankle fracture, right, closed, initial encounter 03/20/2017  . Bimalleolar fracture of right ankle 03/20/2017  . Depression with anxiety 12/25/2011  . HTN (hypertension) 12/25/2011  . GERD (gastroesophageal reflux disease) 12/25/2011  . COPD, moderate ([place]) 12/25/2011  . Chronic constipation 12/25/2011    Willey Blade 11/21/2017, 3:21 PM  [place] [address] [place], [place], [address] Phone: [phone]   Fax:  [phone]  Name: [name] MRN: [id number] Date of Birth: 1946/01/07

## 2017-11-27 ENCOUNTER — Encounter: Payer: Self-pay | Admitting: Physical Therapy

## 2017-11-27 ENCOUNTER — Ambulatory Visit: Payer: Medicare Other | Admitting: Physical Therapy

## 2017-11-27 DIAGNOSIS — M25571 Pain in right ankle and joints of right foot: Secondary | ICD-10-CM

## 2017-11-27 DIAGNOSIS — M19071 Primary osteoarthritis, right ankle and foot: Secondary | ICD-10-CM

## 2017-11-27 DIAGNOSIS — R262 Difficulty in walking, not elsewhere classified: Secondary | ICD-10-CM

## 2017-11-27 DIAGNOSIS — M25671 Stiffness of right ankle, not elsewhere classified: Secondary | ICD-10-CM

## 2017-11-27 NOTE — Therapy (Signed)
[place], [place], [address] Phone: [phone]   Fax:  [phone]  Physical Therapy Treatment  Patient Details  Name: [name] MRN: [id number] Date of Birth: Nov 08, 1945 Referring Provider: Dr. Berenice Primas   Encounter Date: 11/27/2017  PT End of Session - 11/27/17 1600    Visit Number  10    Date for PT Re-Evaluation  12/19/17    PT Start Time  1761    PT Stop Time  1615    PT Time Calculation (min)  60 min    Activity Tolerance  Patient tolerated treatment well    Behavior During Therapy  [facility] for tasks assessed/performed       Past Medical History:  Diagnosis Date  . Allergy   . Ankle fracture 03/16/2017   right  . Bulging lumbar disc    L4 OR L5 TAKES STEROID INJECTIONS FOR TOOK INJECTION MARCH 2019  . Chronic bronchitis ([place]) FEW YEARS AGO  . Chronic hepatitis C ([place])    TOOK HARVONI TX 2017   . Colon polyp   . COPD (chronic obstructive pulmonary disease) ([place])   . Diverticulitis 71YRS AGO  . Dyspnea    WITH HEAVY EXERTION  . Emphysema of lung (HCC)    TOOK PULMONARY FUCTION TEST AND PASSED FEW WEEKS AGO AT FAMILY  MD  . Gallstones   . GERD (gastroesophageal reflux disease)   . History of kidney stones 20 YRS AGO   . Hypertension   . Osteoarthritis   . Pneumonia AT BIRTH  . Spondylosis of cervical region without myelopathy or radiculopathy     Past Surgical History:  Procedure Laterality Date  . ABDOMINAL HYSTERECTOMY  1980   PARTIAL  . ANKLE ARTHROSCOPY Right 10/02/2017   Procedure: RIGHT ANKLE ARTHROSCOPY WITH EXTENSIVE DEBRIDEMENT;  Surgeon: Dorna Leitz, MD;  Location: [facility];  Service: Orthopedics;  Laterality: Right;  90 MINUTES FOR CASE  . BREAST BIOPSY  02/07/2010  . BREAST SURGERY  2011   biopsy  . CHOLECYSTECTOMY  1999   LAPAROSCOPIC  . HARDWARE REMOVAL Right 10/02/2017   Procedure: HARDWARE REMOVAL;  Surgeon: Dorna Leitz, MD;  Location:  [facility];  Service: Orthopedics;  Laterality: Right;  . ORIF ANKLE FRACTURE Right 03/20/2017   Procedure: OPEN REDUCTION INTERNAL FIXATION (ORIF) ANKLE FRACTURE;  Surgeon: Dorna Leitz, MD;  Location: [facility];  Service: Orthopedics;  Laterality: Right;  . TONSILLECTOMY AND ADENOIDECTOMY  1950's    There were no vitals filed for this visit.  Subjective Assessment - 11/27/17 1515    Subjective  "It has been doing good, except for this morning"    Currently in Pain?  No/denies    Pain Score  0-No pain                       [facility] Adult PT Treatment/Exercise - 11/27/17 0001      Ambulation/Gait   Gait Comments  3 flight of stairs,  Pt lands hard on LLE when stepping down due to decrease R ankle DF      Vasopneumatic   Number Minutes Vasopneumatic   15 minutes    Vasopnuematic Location   Ankle    Vasopneumatic Pressure  Medium    Vasopneumatic Temperature   32      Manual Therapy   Manual Therapy  Passive ROM    Manual therapy comments  some PROM taken to end  range and held    Passive ROM  R ankle all motions       Ankle Exercises: Aerobic   Elliptical  I=15 R=5 47fd/3ev     Recumbent Bike  L1x 4 min      Ankle Exercises: Standing   Other Standing Ankle Exercises  toe and heel raises 2x15 black br    Other Standing Ankle Exercises  resisted gait in hall       Ankle Exercises: Stretches   Gastroc Stretch  20 seconds;4 reps      Ankle Exercises: Seated   Other Seated Ankle Exercises  4 way ankle PRE's x20 each                PT Short Term Goals - 10/24/17 1315      PT SHORT TERM GOAL #1   Title  patient to be independent with initial HEP    Status  New    Target Date  11/07/17        PT Long Term Goals - 11/12/17 1233      PT LONG TERM GOAL #1   Title  patient to be independent with advanced HEP    Status  Achieved      PT LONG TERM GOAL #2   Title  patient to improve R ankle AROM to [facility] allow for  improved gait mechanics    Status  On-going      PT LONG TERM GOAL #3   Title  patient to demonstrate good heel strike and appropriate gait mechanics with good stability and minimal deviation    Status  Partially Met      PT LONG TERM GOAL #4   Title  patient to improve R ankle strength to >/= 4/5    Status  On-going            Plan - 11/27/17 1600    Clinical Impression Statement  Pt reports that she is still afraid to climb the stairs at home despite doing stairs in therapy. Continues to compensate due to lack of R ankle DF. No pain reported during today's session    Rehab Potential  Good    PT Frequency  1x / week    PT Treatment/Interventions  ADLs/Self Care Home Management;Cryotherapy;Electrical Stimulation;Gait training;Balance training;Therapeutic exercise;Therapeutic activities;Functional mobility training;Stair training;Patient/family education;Manual techniques;Vasopneumatic Device;Passive range of motion;Taping    PT Next Visit Plan  R ankle stability, strength and ROM       Patient will benefit from skilled therapeutic intervention in order to improve the following deficits and impairments:  Abnormal gait, Decreased activity tolerance, Decreased balance, Decreased mobility, Decreased strength, Increased edema, Impaired flexibility, Pain, Decreased range of motion, Difficulty walking  Visit Diagnosis: Stiffness of right ankle, not elsewhere classified  Localized osteoarthritis of right ankle  Pain in right ankle and joints of right foot  Difficulty in walking, not elsewhere classified     Problem List Patient Active Problem List   Diagnosis Date Noted  . Localized osteoarthritis of right ankle 10/02/2017  . Retained orthopedic hardware 10/02/2017  . Bimalleolar ankle fracture, right, closed, initial encounter 03/20/2017  . Bimalleolar fracture of right ankle 03/20/2017  . Depression with anxiety 12/25/2011  . HTN (hypertension) 12/25/2011  . GERD  (gastroesophageal reflux disease) 12/25/2011  . COPD, moderate ([place] 12/25/2011  . Chronic constipation 12/25/2011    RScot Jun PTA 11/27/2017, 4:02 PM  [place]  [place], [address] Phone: [phone]   Fax:  [phone]  Name: [name] MRN: [id number] Date of Birth: [birth year]

## 2017-12-03 ENCOUNTER — Ambulatory Visit: Payer: Medicare Other | Attending: Orthopedic Surgery | Admitting: Physical Therapy

## 2017-12-03 ENCOUNTER — Encounter: Payer: Self-pay | Admitting: Physical Therapy

## 2017-12-03 DIAGNOSIS — R2689 Other abnormalities of gait and mobility: Secondary | ICD-10-CM | POA: Diagnosis present

## 2017-12-03 DIAGNOSIS — R262 Difficulty in walking, not elsewhere classified: Secondary | ICD-10-CM | POA: Diagnosis present

## 2017-12-03 DIAGNOSIS — M25571 Pain in right ankle and joints of right foot: Secondary | ICD-10-CM | POA: Diagnosis present

## 2017-12-03 DIAGNOSIS — M19071 Primary osteoarthritis, right ankle and foot: Secondary | ICD-10-CM | POA: Diagnosis present

## 2017-12-03 DIAGNOSIS — R600 Localized edema: Secondary | ICD-10-CM | POA: Diagnosis present

## 2017-12-03 DIAGNOSIS — M25671 Stiffness of right ankle, not elsewhere classified: Secondary | ICD-10-CM | POA: Insufficient documentation

## 2017-12-03 NOTE — Therapy (Signed)
[facility] [place], [place], [address] Phone: [phone]   Fax:  [phone]  Physical Therapy Treatment  Patient Details  Name: [name] MRN: [id number] Date of Birth: 02/25/1946 Referring Provider: Dr. Berenice Primas   Encounter Date: 12/03/2017  PT End of Session - 12/03/17 1055    Visit Number  11    Number of Visits  16    Date for PT Re-Evaluation  12/19/17    PT Start Time  6812    PT Stop Time  1110    PT Time Calculation (min)  55 min    Activity Tolerance  Patient tolerated treatment well    Behavior During Therapy  [facility] for tasks assessed/performed       Past Medical History:  Diagnosis Date  . Allergy   . Ankle fracture 03/16/2017   right  . Bulging lumbar disc    L4 OR L5 TAKES STEROID INJECTIONS FOR TOOK INJECTION MARCH 2019  . Chronic bronchitis ([place]) FEW YEARS AGO  . Chronic hepatitis C ([place])    TOOK HARVONI TX 2017   . Colon polyp   . COPD (chronic obstructive pulmonary disease) ([place])   . Diverticulitis 25YRS AGO  . Dyspnea    WITH HEAVY EXERTION  . Emphysema of lung ([facility])    TOOK PULMONARY FUCTION TEST AND PASSED FEW WEEKS AGO AT FAMILY  MD  . Gallstones   . GERD (gastroesophageal reflux disease)   . History of kidney stones 20 YRS AGO   . Hypertension   . Osteoarthritis   . Pneumonia AT BIRTH  . Spondylosis of cervical region without myelopathy or radiculopathy     Past Surgical History:  Procedure Laterality Date  . ABDOMINAL HYSTERECTOMY  1980   PARTIAL  . ANKLE ARTHROSCOPY Right 10/02/2017   Procedure: RIGHT ANKLE ARTHROSCOPY WITH EXTENSIVE DEBRIDEMENT;  Surgeon: Dorna Leitz, MD;  Location: [facility];  Service: Orthopedics;  Laterality: Right;  90 MINUTES FOR CASE  . BREAST BIOPSY  02/07/2010  . BREAST SURGERY  2011   biopsy  . CHOLECYSTECTOMY  1999   LAPAROSCOPIC  . HARDWARE REMOVAL Right 10/02/2017   Procedure: HARDWARE REMOVAL;  Surgeon: Dorna Leitz, MD;  Location: [facility];  Service: Orthopedics;  Laterality: Right;  . ORIF ANKLE FRACTURE Right 03/20/2017   Procedure: OPEN REDUCTION INTERNAL FIXATION (ORIF) ANKLE FRACTURE;  Surgeon: Dorna Leitz, MD;  Location: [facility];  Service: Orthopedics;  Laterality: Right;  . TONSILLECTOMY AND ADENOIDECTOMY  1950's    There were no vitals filed for this visit.  Subjective Assessment - 12/03/17 1021    Subjective  "kinda tight today"    Currently in Pain?  Yes    Pain Score  3     Pain Location  Ankle    Pain Orientation  Right                       [facility] Adult PT Treatment/Exercise - 12/03/17 0001      Ambulation/Gait   Gait Comments  one flight then back down and up hill.       Vasopneumatic   Number Minutes Vasopneumatic   15 minutes    Vasopnuematic Location   Ankle    Vasopneumatic Pressure  Medium    Vasopneumatic Temperature   32      Ankle Exercises: Aerobic   Elliptical  I=17 R=8 31fd/2ev     Recumbent  Bike  L1x 6 min      Ankle Exercises: Machines for Strengthening   Cybex Leg Press  50lb 2x15      Ankle Exercises: Stretches   Gastroc Stretch  20 seconds;4 reps      Ankle Exercises: Seated   Other Seated Ankle Exercises  4 way ankle PRE's x20 each                PT Short Term Goals - 10/24/17 1315      PT SHORT TERM GOAL #1   Title  patient to be independent with initial HEP    Status  New    Target Date  11/07/17        PT Long Term Goals - 12/03/17 1104      PT LONG TERM GOAL #1   Title  patient to be independent with advanced HEP    Status  Achieved      PT LONG TERM GOAL #2   Title  patient to improve R ankle AROM to [facility] to allow for improved gait mechanics    Status  Partially Met      PT LONG TERM GOAL #3   Title  patient to demonstrate good heel strike and appropriate gait mechanics with good stability and minimal deviation    Status  Partially Met      PT LONG TERM GOAL #4    Title  patient to improve R ankle strength to >/= 4/5    Status  Partially Met      PT LONG TERM GOAL #5   Title  increase strength to 4/5    Status  Achieved            Plan - 12/03/17 1105    Clinical Impression Statement  Pt able to perform all exercises well, but continues to lack R ankle dorsiflexion. Minium compensation with stair negotiation. She reports that's he is unable to turn sharply and has to take extra steps to turn.     Rehab Potential  Good    PT Frequency  1x / week    PT Duration  8 weeks    PT Treatment/Interventions  ADLs/Self Care Home Management;Cryotherapy;Electrical Stimulation;Gait training;Balance training;Therapeutic exercise;Therapeutic activities;Functional mobility training;Stair training;Patient/family education;Manual techniques;Vasopneumatic Device;Passive range of motion;Taping    PT Next Visit Plan  R ankle stability, strength and ROM       Patient will benefit from skilled therapeutic intervention in order to improve the following deficits and impairments:  Abnormal gait, Decreased activity tolerance, Decreased balance, Decreased mobility, Decreased strength, Increased edema, Impaired flexibility, Pain, Decreased range of motion, Difficulty walking  Visit Diagnosis: Stiffness of right ankle, not elsewhere classified  Localized osteoarthritis of right ankle  Pain in right ankle and joints of right foot  Difficulty in walking, not elsewhere classified  Localized edema     Problem List Patient Active Problem List   Diagnosis Date Noted  . Localized osteoarthritis of right ankle 10/02/2017  . Retained orthopedic hardware 10/02/2017  . Bimalleolar ankle fracture, right, closed, initial encounter 03/20/2017  . Bimalleolar fracture of right ankle 03/20/2017  . Depression with anxiety 12/25/2011  . HTN (hypertension) 12/25/2011  . GERD (gastroesophageal reflux disease) 12/25/2011  . COPD, moderate ([place]) 12/25/2011  . Chronic  constipation 12/25/2011    Scot Jun, PTA 12/03/2017, 11:08 AM  [place] [address] [place] [place], [place], [address] Phone: [phone]   Fax:  [phone]  Name:  [name] MRN: [id number] Date of Birth: 06/17/1946

## 2017-12-05 ENCOUNTER — Ambulatory Visit: Payer: Medicare Other | Admitting: Physical Therapy

## 2017-12-05 ENCOUNTER — Encounter: Payer: Self-pay | Admitting: Physical Therapy

## 2017-12-05 DIAGNOSIS — M19071 Primary osteoarthritis, right ankle and foot: Secondary | ICD-10-CM

## 2017-12-05 DIAGNOSIS — R262 Difficulty in walking, not elsewhere classified: Secondary | ICD-10-CM

## 2017-12-05 DIAGNOSIS — M25571 Pain in right ankle and joints of right foot: Secondary | ICD-10-CM

## 2017-12-05 DIAGNOSIS — M25671 Stiffness of right ankle, not elsewhere classified: Secondary | ICD-10-CM

## 2017-12-05 NOTE — Therapy (Signed)
[facility] [place], [place], [address] Phone: [phone]   Fax:  [phone]  Physical Therapy Treatment  Patient Details  Name: [name] MRN: [id number] Date of Birth: [birth year] Referring Provider: Dr. Berenice Primas   Encounter Date: 12/05/2017  PT End of Session - 12/05/17 1519    Visit Number  12    Date for PT Re-Evaluation  12/19/17    PT Start Time  1430    PT Stop Time  1530    PT Time Calculation (min)  60 min    Activity Tolerance  Patient tolerated treatment well    Behavior During Therapy  [facility] for tasks assessed/performed       Past Medical History:  Diagnosis Date  . Allergy   . Ankle fracture 03/16/2017   right  . Bulging lumbar disc    L4 OR L5 TAKES STEROID INJECTIONS FOR TOOK INJECTION MARCH 2019  . Chronic bronchitis ([place]) FEW YEARS AGO  . Chronic hepatitis C ([place])    TOOK HARVONI TX 2017   . Colon polyp   . COPD (chronic obstructive pulmonary disease) ([place])   . Diverticulitis 57YRS AGO  . Dyspnea    WITH HEAVY EXERTION  . Emphysema of lung (HCC)    TOOK PULMONARY FUCTION TEST AND PASSED FEW WEEKS AGO AT FAMILY  MD  . Gallstones   . GERD (gastroesophageal reflux disease)   . History of kidney stones 20 YRS AGO   . Hypertension   . Osteoarthritis   . Pneumonia AT BIRTH  . Spondylosis of cervical region without myelopathy or radiculopathy     Past Surgical History:  Procedure Laterality Date  . ABDOMINAL HYSTERECTOMY  1980   PARTIAL  . ANKLE ARTHROSCOPY Right 10/02/2017   Procedure: RIGHT ANKLE ARTHROSCOPY WITH EXTENSIVE DEBRIDEMENT;  Surgeon: Dorna Leitz, MD;  Location: [facility];  Service: Orthopedics;  Laterality: Right;  90 MINUTES FOR CASE  . BREAST BIOPSY  02/07/2010  . BREAST SURGERY  2011   biopsy  . CHOLECYSTECTOMY  1999   LAPAROSCOPIC  . HARDWARE REMOVAL Right 10/02/2017   Procedure: HARDWARE REMOVAL;  Surgeon: Dorna Leitz, MD;  Location:  [facility];  Service: Orthopedics;  Laterality: Right;  . ORIF ANKLE FRACTURE Right 03/20/2017   Procedure: OPEN REDUCTION INTERNAL FIXATION (ORIF) ANKLE FRACTURE;  Surgeon: Dorna Leitz, MD;  Location: [facility];  Service: Orthopedics;  Laterality: Right;  . TONSILLECTOMY AND ADENOIDECTOMY  1950's    There were no vitals filed for this visit.  Subjective Assessment - 12/05/17 1446    Subjective  Pt enters clinic reporting difficulty ambulating on uneven surfaces.    Currently in Pain?  Yes    Pain Score  3     Pain Location  Ankle    Pain Orientation  Right                       [facility] Adult PT Treatment/Exercise - 12/05/17 0001      Ambulation/Gait   Ambulation/Gait  Yes    Ambulation/Gait Assistance  5: Supervision    Assistive device  None    Gait Pattern  Step-through pattern;Decreased dorsiflexion - right    Ambulation Surface  Outdoor;Grass;Gravel;Paved;Unlevel    Gait Comments  Difficulty allowing R ankle to articulate on surface, LOB X1, Pt ablulated with a stiff R ankle 3      High Level Balance   High Level  Balance Comments  SLS onairex HHAx1      Cryotherapy   Number Minutes Cryotherapy  10 Minutes    Cryotherapy Location  Ankle    Type of Cryotherapy  Ice pack      Manual Therapy   Manual Therapy  Passive ROM    Manual therapy comments  some PROM taken to end range and held    Passive ROM  R ankle all motions       Ankle Exercises: Seated   BAPS  15 reps;Sitting;Level 5 some therapist assit to promote more articulation     Other Seated Ankle Exercises  4 way ankle PRE's x20 each       Ankle Exercises: Aerobic   Recumbent Bike  L1x 6 min               PT Short Term Goals - 10/24/17 1315      PT SHORT TERM GOAL #1   Title  patient to be independent with initial HEP    Status  New    Target Date  11/07/17        PT Long Term Goals - 12/03/17 1104      PT LONG TERM GOAL #1   Title  patient to  be independent with advanced HEP    Status  Achieved      PT LONG TERM GOAL #2   Title  patient to improve R ankle AROM to [facility] to allow for improved gait mechanics    Status  Partially Met      PT LONG TERM GOAL #3   Title  patient to demonstrate good heel strike and appropriate gait mechanics with good stability and minimal deviation    Status  Partially Met      PT LONG TERM GOAL #4   Title  patient to improve R ankle strength to >/= 4/5    Status  Partially Met      PT LONG TERM GOAL #5   Title  increase strength to 4/5    Status  Achieved            Plan - 12/05/17 1520    Clinical Impression Statement  Pt wit difficulty ambulating on uneven surfaces, LOB x1. She tends to walk with a rigid R ankle preventing any articulation. Pain and guarding with MT. R ankle does supinate when ambulating. Difficulty with BAP'S board, assist needed with BAP'S board.     Rehab Potential  Good    PT Frequency  1x / week    PT Treatment/Interventions  ADLs/Self Care Home Management;Cryotherapy;Electrical Stimulation;Gait training;Balance training;Therapeutic exercise;Therapeutic activities;Functional mobility training;Stair training;Patient/family education;Manual techniques;Vasopneumatic Device;Passive range of motion;Taping    PT Next Visit Plan  R ankle stability, strength and ROM       Patient will benefit from skilled therapeutic intervention in order to improve the following deficits and impairments:  Abnormal gait, Decreased activity tolerance, Decreased balance, Decreased mobility, Decreased strength, Increased edema, Impaired flexibility, Pain, Decreased range of motion, Difficulty walking  Visit Diagnosis: Stiffness of right ankle, not elsewhere classified  Localized osteoarthritis of right ankle  Pain in right ankle and joints of right foot  Difficulty in walking, not elsewhere classified     Problem List Patient Active Problem List   Diagnosis Date Noted  . Localized  osteoarthritis of right ankle 10/02/2017  . Retained orthopedic hardware 10/02/2017  . Bimalleolar ankle fracture, right, closed, initial encounter 03/20/2017  . Bimalleolar fracture of right ankle 03/20/2017  . Depression  with anxiety 12/25/2011  . HTN (hypertension) 12/25/2011  . GERD (gastroesophageal reflux disease) 12/25/2011  . COPD, moderate ([place]) 12/25/2011  . Chronic constipation 12/25/2011    Scot Jun, PTA 12/05/2017, 3:24 PM  [place], [place], [address] Phone: [phone]   Fax:  [phone]  Name: Khadijah Mastrianni MRN: [id number] Date of Birth: Aug 20, 1945

## 2017-12-10 ENCOUNTER — Encounter: Payer: Self-pay | Admitting: Physical Therapy

## 2017-12-10 ENCOUNTER — Ambulatory Visit: Payer: Medicare Other | Admitting: Physical Therapy

## 2017-12-10 DIAGNOSIS — M25671 Stiffness of right ankle, not elsewhere classified: Secondary | ICD-10-CM

## 2017-12-10 DIAGNOSIS — R262 Difficulty in walking, not elsewhere classified: Secondary | ICD-10-CM

## 2017-12-10 DIAGNOSIS — M25571 Pain in right ankle and joints of right foot: Secondary | ICD-10-CM

## 2017-12-10 DIAGNOSIS — R600 Localized edema: Secondary | ICD-10-CM

## 2017-12-10 DIAGNOSIS — M19071 Primary osteoarthritis, right ankle and foot: Secondary | ICD-10-CM

## 2017-12-10 NOTE — Therapy (Signed)
[place], [place], [address] Phone: [phone]   Fax:  [phone]  Physical Therapy Treatment  Patient Details  Name: [name] MRN: [id number] Date of Birth: [birth year] Referring Provider: Dr. Berenice Primas   Encounter Date: 12/10/2017  PT End of Session - 12/10/17 1515    Visit Number  13    Date for PT Re-Evaluation  12/19/17    PT Start Time  1430    PT Stop Time  1515    PT Time Calculation (min)  45 min    Activity Tolerance  Patient tolerated treatment well    Behavior During Therapy  [facility] for tasks assessed/performed       Past Medical History:  Diagnosis Date  . Allergy   . Ankle fracture 03/16/2017   right  . Bulging lumbar disc    L4 OR L5 TAKES STEROID INJECTIONS FOR TOOK INJECTION MARCH 2019  . Chronic bronchitis ([place]) FEW YEARS AGO  . Chronic hepatitis C ([place])    TOOK HARVONI TX 2017   . Colon polyp   . COPD (chronic obstructive pulmonary disease) ([place])   . Diverticulitis 44YRS AGO  . Dyspnea    WITH HEAVY EXERTION  . Emphysema of lung (H[facility])    TOOK PULMONARY FUCTION TEST AND PASSED FEW WEEKS AGO AT FAMILY  MD  . Gallstones   . GERD (gastroesophageal reflux disease)   . History of kidney stones 20 YRS AGO   . Hypertension   . Osteoarthritis   . Pneumonia AT BIRTH  . Spondylosis of cervical region without myelopathy or radiculopathy     Past Surgical History:  Procedure Laterality Date  . ABDOMINAL HYSTERECTOMY  1980   PARTIAL  . ANKLE ARTHROSCOPY Right 10/02/2017   Procedure: RIGHT ANKLE ARTHROSCOPY WITH EXTENSIVE DEBRIDEMENT;  Surgeon: Dorna Leitz, MD;  Location: [facility];  Service: Orthopedics;  Laterality: Right;  90 MINUTES FOR CASE  . BREAST BIOPSY  02/07/2010  . BREAST SURGERY  2011   biopsy  . CHOLECYSTECTOMY  1999   LAPAROSCOPIC  . HARDWARE REMOVAL Right 10/02/2017   Procedure: HARDWARE REMOVAL;  Surgeon: Dorna Leitz, MD;  Location:  [facility];  Service: Orthopedics;  Laterality: Right;  . ORIF ANKLE FRACTURE Right 03/20/2017   Procedure: OPEN REDUCTION INTERNAL FIXATION (ORIF) ANKLE FRACTURE;  Surgeon: Dorna Leitz, MD;  Location: [facility];  Service: Orthopedics;  Laterality: Right;  . TONSILLECTOMY AND ADENOIDECTOMY  1950's    There were no vitals filed for this visit.  Subjective Assessment - 12/10/17 1432    Subjective  "Well I feel like it hurts when I bend it"    Currently in Pain?  Yes    Pain Score  3     Pain Location  Ankle    Pain Orientation  Right    Pain Descriptors / Indicators  Aching;Nagging                       [facility] Adult PT Treatment/Exercise - 12/10/17 0001      Ambulation/Gait   Ambulation/Gait  Yes    Ambulation/Gait Assistance  5: Supervision    Assistive device  None    Gait Pattern  Step-through pattern;Decreased dorsiflexion - right    Ambulation Surface  Unlevel;Outdoor;Paved;Gravel;Grass    Gait Comments  Difficulty allowing R ankle to articulate on surface, LOB X1, Pt ablulated with a stiff R ankle  Manual Therapy   Manual Therapy  Passive ROM;Soft tissue mobilization    Manual therapy comments  some PROM taken to end range and held    Soft tissue mobilization  scar mobilization    Passive ROM  R ankle all motions       Ankle Exercises: Aerobic   Elliptical  I=15 R=5 62fd/2ev     Tread Mill  117m I 7 x2 min    Recumbent Bike  L1x 6 min      Ankle Exercises: Standing   Other Standing Ankle Exercises  toe raises on airex 2x20               PT Short Term Goals - 10/24/17 1315      PT SHORT TERM GOAL #1   Title  patient to be independent with initial HEP    Status  New    Target Date  11/07/17        PT Long Term Goals - 12/03/17 1104      PT LONG TERM GOAL #1   Title  patient to be independent with advanced HEP    Status  Achieved      PT LONG TERM GOAL #2   Title  patient to improve R ankle [facility]  [facility] for improved gait mechanics    Status  Partially Met      PT LONG TERM GOAL #3   Title  patient to demonstrate good heel strike and appropriate gait mechanics with good stability and minimal deviation    Status  Partially Met      PT LONG TERM GOAL #4   Title  patient to improve R ankle strength to >/= 4/5    Status  Partially Met      PT LONG TERM GOAL #5   Title  increase strength to 4/5    Status  Achieved            Plan - 12/10/17 1516    Clinical Impression Statement  Continued with outdoor ambulation on different terrain and uneven surfaces. Pt continues to walk with a stiff ankle preventing it from articulating with the ground. She reports that she has pain when her ankle bends. Pt is guarded with MT at times due to pain. Pt is very sensitive around her scar and does not like for anything to touch it. informed  and demonstrated how she need to move her scar around to prevent any adhesions.    Rehab Potential  Good    PT Frequency  1x / week    PT Duration  8 weeks    PT Treatment/Interventions  ADLs/Self Care Home Management;Cryotherapy;Electrical Stimulation;Gait training;Balance training;Therapeutic exercise;Therapeutic activities;Functional mobility training;Stair training;Patient/family education;Manual techniques;Vasopneumatic Device;Passive range of motion;Taping    PT Next Visit Plan  R ankle stability, strength and ROM       Patient will benefit from skilled therapeutic intervention in order to improve the following deficits and impairments:  Abnormal gait, Decreased activity tolerance, Decreased balance, Decreased mobility, Decreased strength, Increased edema, Impaired flexibility, Pain, Decreased range of motion, Difficulty walking  Visit Diagnosis: Stiffness of right ankle, not elsewhere classified  Localized osteoarthritis of right ankle  Pain in right ankle and joints of right foot  Difficulty in walking, not elsewhere  classified  Localized edema     Problem List Patient Active Problem List   Diagnosis Date Noted  . Localized osteoarthritis of right ankle 10/02/2017  . Retained orthopedic hardware 10/02/2017  . Bimalleolar  ankle fracture, right, closed, initial encounter 03/20/2017  . Bimalleolar fracture of right ankle 03/20/2017  . Depression with anxiety 12/25/2011  . HTN (hypertension) 12/25/2011  . GERD (gastroesophageal reflux disease) 12/25/2011  . COPD, moderate ([place]) 12/25/2011  . Chronic constipation 12/25/2011    Scot Jun, PTA 12/10/2017, 3:28 PM  [place], [place], [address] Phone: [phone]   Fax:  [phone]  Name: Miguelina Fore MRN: [id number] Date of Birth: 10/26/1945

## 2017-12-12 ENCOUNTER — Encounter: Payer: Self-pay | Admitting: Physical Therapy

## 2017-12-12 ENCOUNTER — Ambulatory Visit: Payer: Medicare Other | Admitting: Physical Therapy

## 2017-12-12 DIAGNOSIS — M25671 Stiffness of right ankle, not elsewhere classified: Secondary | ICD-10-CM | POA: Diagnosis not present

## 2017-12-12 DIAGNOSIS — M19071 Primary osteoarthritis, right ankle and foot: Secondary | ICD-10-CM

## 2017-12-12 DIAGNOSIS — R262 Difficulty in walking, not elsewhere classified: Secondary | ICD-10-CM

## 2017-12-12 DIAGNOSIS — M25571 Pain in right ankle and joints of right foot: Secondary | ICD-10-CM

## 2017-12-12 NOTE — Therapy (Addendum)
[place], [place], [address] Phone: [phone]   Fax:  [phone]  Physical Therapy Treatment  Patient Details  Name: [name] MRN: [id number] Date of Birth: 1946/02/08 Referring Provider: Dr. Berenice Primas   Encounter Date: 12/12/2017  PT End of Session - 12/12/17 1518    Visit Number  14    Date for PT Re-Evaluation  12/19/17    PT Start Time  1430    PT Stop Time  1531    PT Time Calculation (min)  61 min    Activity Tolerance  Patient tolerated treatment well    Behavior During Therapy  [facility] for tasks assessed/performed       Past Medical History:  Diagnosis Date  . Allergy   . Ankle fracture 03/16/2017   right  . Bulging lumbar disc    L4 OR L5 TAKES STEROID INJECTIONS FOR TOOK INJECTION MARCH 2019  . Chronic bronchitis ([place]) FEW YEARS AGO  . Chronic hepatitis C ([place])    TOOK HARVONI TX 2017   . Colon polyp   . COPD (chronic obstructive pulmonary disease) ([place])   . Diverticulitis 29YRS AGO  . Dyspnea    WITH HEAVY EXERTION  . Emphysema of lung (H[facility])    TOOK PULMONARY FUCTION TEST AND PASSED FEW WEEKS AGO AT FAMILY  MD  . Gallstones   . GERD (gastroesophageal reflux disease)   . History of kidney stones 20 YRS AGO   . Hypertension   . Osteoarthritis   . Pneumonia AT BIRTH  . Spondylosis of cervical region without myelopathy or radiculopathy     Past Surgical History:  Procedure Laterality Date  . ABDOMINAL HYSTERECTOMY  1980   PARTIAL  . ANKLE ARTHROSCOPY Right 10/02/2017   Procedure: RIGHT ANKLE ARTHROSCOPY WITH EXTENSIVE DEBRIDEMENT;  Surgeon: Dorna Leitz, MD;  Location: [place];  Service: Orthopedics;  Laterality: Right;  90 MINUTES FOR CASE  . BREAST BIOPSY  02/07/2010  . BREAST SURGERY  2011   biopsy  . CHOLECYSTECTOMY  1999   LAPAROSCOPIC  . HARDWARE REMOVAL Right 10/02/2017   Procedure: HARDWARE REMOVAL;  Surgeon: Dorna Leitz, MD;  Location:  [facility];  Service: Orthopedics;  Laterality: Right;  . ORIF ANKLE FRACTURE Right 03/20/2017   Procedure: OPEN REDUCTION INTERNAL FIXATION (ORIF) ANKLE FRACTURE;  Surgeon: Dorna Leitz, MD;  Location: [place] [facility];  Service: Orthopedics;  Laterality: Right;  . TONSILLECTOMY AND ADENOIDECTOMY  1950's    There were no vitals filed for this visit.  Subjective Assessment - 12/12/17 1436    Subjective  Pt reports difficulty ambulating on uneven surfaces     Currently in Pain?  Yes    Pain Score  2     Pain Location  Ankle    Pain Orientation  Right         [facility] PT Assessment - 12/12/17 0001      AROM   Right/Left Ankle  Right    Right Ankle Dorsiflexion  5    Right Ankle Plantar Flexion  68    Right Ankle Inversion  42    Right Ankle Eversion  30                   [facility] Adult PT Treatment/Exercise - 12/12/17 0001      High Level Balance   High Level Balance Comments  BOSU standing 3 x 30 sec min assist  Vasopneumatic   Number Minutes Vasopneumatic   15 minutes    Vasopnuematic Location   Ankle    Vasopneumatic Pressure  Medium    Vasopneumatic Temperature   32      Manual Therapy   Manual Therapy  Passive ROM;Soft tissue mobilization    Manual therapy comments  some PROM taken to end range and held    Soft tissue mobilization  scar mobilization    Passive ROM  R ankle all motions       Ankle Exercises: Aerobic   Tread Mill  57mh I 7 x2 min    Recumbent Bike  L2x 6 min      Ankle Exercises: Machines for Strengthening   Cybex Leg Press  50lb 2x15, 50lb heel raises 2x15       Ankle Exercises: Standing   Other Standing Ankle Exercises  sit to stand LE on dyna diak 2x15      Ankle Exercises: Seated   Other Seated Ankle Exercises  4 way ankle PRE's x20 each                PT Short Term Goals - 10/24/17 1315      PT SHORT TERM GOAL #1   Title  patient to be independent with initial HEP    Status  New     Target Date  11/07/17        PT Long Term Goals - 12/12/17 1518      PT LONG TERM GOAL #1   Title  patient to be independent with advanced HEP    Status  Achieved      PT LONG TERM GOAL #2   Title  patient to [facility] improved gait mechanics    Status  Partially Met      PT LONG TERM GOAL #3   Title  patient to demonstrate good heel strike and appropriate gait mechanics with good stability and minimal deviation    Status  Partially Met      PT LONG TERM GOAL #4   Title  patient to improve R ankle strength to >/= 4/5    Status  Partially Met            Plan - 12/12/17 1519    Clinical Impression Statement  Pt with a little improvement in R ankle AROM. She reports no limitation on flat even surfaces. She does reports some issues with noncompliant surfaces. Difficulty with BOSU standing. Pt ambulates with a stiff ankle not allowing her ankle to articulate to different surfaces due to pain. She continues to be sensitive over incision.      Rehab Potential  Good    PT Frequency  1x / week    PT Duration  8 weeks    PT Treatment/Interventions  ADLs/Self Care Home Management;Cryotherapy;Electrical Stimulation;Gait training;Balance training;Therapeutic exercise;Therapeutic activities;Functional mobility training;Stair training;Patient/family education;Manual techniques;Vasopneumatic Device;Passive range of motion;Taping    PT Next Visit Plan  R ankle stability, strength and ROM       Patient will benefit from skilled therapeutic intervention in order to improve the following deficits and impairments:  Abnormal gait, Decreased activity tolerance, Decreased balance, Decreased mobility, Decreased strength, Increased edema, Impaired flexibility, Pain, Decreased range of motion, Difficulty walking  Visit Diagnosis: Stiffness of right ankle, not elsewhere classified  Localized osteoarthritis of right ankle  Pain in right ankle and joints of right  foot  Difficulty in walking, not elsewhere classified  Problem List Patient Active Problem List   Diagnosis Date Noted  . Localized osteoarthritis of right ankle 10/02/2017  . Retained orthopedic hardware 10/02/2017  . Bimalleolar ankle fracture, right, closed, initial encounter 03/20/2017  . Bimalleolar fracture of right ankle 03/20/2017  . Depression with anxiety 12/25/2011  . HTN (hypertension) 12/25/2011  . GERD (gastroesophageal reflux disease) 12/25/2011  . COPD, moderate ([place]) 12/25/2011  . Chronic constipation 12/25/2011    Scot Jun, PTA 12/12/2017, 3:21 PM  [place], [place], [address] Phone: [phone]   Fax:  [phone]  Name: [name] MRN: [id number] Date of Birth: 12/20/1945

## 2017-12-18 ENCOUNTER — Encounter: Payer: Self-pay | Admitting: Physical Therapy

## 2017-12-18 ENCOUNTER — Ambulatory Visit: Payer: Medicare Other | Admitting: Physical Therapy

## 2017-12-18 DIAGNOSIS — R262 Difficulty in walking, not elsewhere classified: Secondary | ICD-10-CM

## 2017-12-18 DIAGNOSIS — M25671 Stiffness of right ankle, not elsewhere classified: Secondary | ICD-10-CM

## 2017-12-18 DIAGNOSIS — R600 Localized edema: Secondary | ICD-10-CM

## 2017-12-18 DIAGNOSIS — M25571 Pain in right ankle and joints of right foot: Secondary | ICD-10-CM

## 2017-12-18 DIAGNOSIS — M19071 Primary osteoarthritis, right ankle and foot: Secondary | ICD-10-CM

## 2017-12-18 NOTE — Therapy (Signed)
[address] [place], [place], [address] Phone: [phone]   Fax:  [phone]  Physical Therapy Treatment  Patient Details  Name: [name] MRN: [id number] Date of Birth: May 04, 1946 Referring Provider: Dr. Berenice Primas   Encounter Date: 12/18/2017  PT End of Session - 12/18/17 1228    Visit Number  15    Date for PT Re-Evaluation  12/19/17    PT Start Time  1145    PT Stop Time  1243    PT Time Calculation (min)  58 min    Activity Tolerance  Patient tolerated treatment well    Behavior During Therapy  [facility] for tasks assessed/performed       Past Medical History:  Diagnosis Date  . Allergy   . Ankle fracture 03/16/2017   right  . Bulging lumbar disc    L4 OR L5 TAKES STEROID INJECTIONS FOR TOOK INJECTION MARCH 2019  . Chronic bronchitis ([place]) FEW YEARS AGO  . Chronic hepatitis C ([place])    TOOK HARVONI TX 2017   . Colon polyp   . COPD (chronic obstructive pulmonary disease) ([place])   . Diverticulitis 65YRS AGO  . Dyspnea    WITH HEAVY EXERTION  . Emphysema of lung (H[facility])    TOOK PULMONARY FUCTION TEST AND PASSED FEW WEEKS AGO AT FAMILY  MD  . Gallstones   . GERD (gastroesophageal reflux disease)   . History of kidney stones 20 YRS AGO   . Hypertension   . Osteoarthritis   . Pneumonia AT BIRTH  . Spondylosis of cervical region without myelopathy or radiculopathy     Past Surgical History:  Procedure Laterality Date  . ABDOMINAL HYSTERECTOMY  1980   PARTIAL  . ANKLE ARTHROSCOPY Right 10/02/2017   Procedure: RIGHT ANKLE ARTHROSCOPY WITH EXTENSIVE DEBRIDEMENT;  Surgeon: Dorna Leitz, MD;  Location: [place];  Service: Orthopedics;  Laterality: Right;  90 MINUTES FOR CASE  . BREAST BIOPSY  02/07/2010  . BREAST SURGERY  2011   biopsy  . CHOLECYSTECTOMY  1999   LAPAROSCOPIC  . HARDWARE REMOVAL Right 10/02/2017   Procedure: HARDWARE REMOVAL;  Surgeon: Dorna Leitz, MD;  Location:  [facility];  Service: Orthopedics;  Laterality: Right;  . ORIF ANKLE FRACTURE Right 03/20/2017   Procedure: OPEN REDUCTION INTERNAL FIXATION (ORIF) ANKLE FRACTURE;  Surgeon: Dorna Leitz, MD;  Location: [place];  Service: Orthopedics;  Laterality: Right;  . TONSILLECTOMY AND ADENOIDECTOMY  1950's    There were no vitals filed for this visit.  Subjective Assessment - 12/18/17 1148    Subjective  Pt continues to report difficulty ambulating on uneven surfaces. PA was pleased  with progress.    Currently in Pain?  No/denies    Pain Score  0-No pain    Pain Location  Ankle    Pain Orientation  Right                       [facility] Adult PT Treatment/Exercise - 12/18/17 0001      Ambulation/Gait   Ambulation/Gait  Yes    Ambulation/Gait Assistance  5: Supervision    Assistive device  None    Gait Pattern  Step-through pattern;Decreased dorsiflexion - right    Ambulation Surface  Unlevel;Paved;Outdoor    Gait Comments  one flight then back down with a slow jog up hill      Vasopneumatic   Number Minutes Vasopneumatic  15 minutes    Vasopnuematic Location   Ankle    Vasopneumatic Pressure  Medium    Vasopneumatic Temperature   32      Manual Therapy   Manual Therapy  Passive ROM;Soft tissue mobilization    Manual therapy comments  some PROM taken to end range and held    Soft tissue mobilization  scar mobilization    Passive ROM  R ankle all motions       Ankle Exercises: Aerobic   Elliptical  I=15 R=5 34fd/2ev     Recumbent Bike  L2x 4 min      Ankle Exercises: Standing   Other Standing Ankle Exercises  sit to stand from low surface 2x5   On BOSU x 131m     Ankle Exercises: Seated   Other Seated Ankle Exercises  4 way ankle PRE's x20 each       Ankle Exercises: Machines for Strengthening   Cybex Leg Press  50lb 2x15               PT Short Term Goals - 10/24/17 1315      PT SHORT TERM GOAL #1   Title  patient to  be independent with initial HEP    Status  New    Target Date  11/07/17        PT Long Term Goals - 12/12/17 1518      PT LONG TERM GOAL #1   Title  patient to be independent with advanced HEP    Status  Achieved      PT LONG TERM GOAL #2   Title  patient to improve R ankle AROM to [facility] allow for improved gait mechanics    Status  Partially Met      PT LONG TERM GOAL #3   Title  patient to demonstrate good heel strike and appropriate gait mechanics with good stability and minimal deviation    Status  Partially Met      PT LONG TERM GOAL #4   Title  patient to improve R ankle strength to >/= 4/5    Status  Partially Met            Plan - 12/18/17 1231    Clinical Impression Statement  Pt reports that her visit with the P.A. went well. Progressed to some up hill light jogging, pt R ankle fatigues quick as she started dragging her R heel. Tightness noted during MT with dorsi flexion. BOSU standing still poses some difficulty.    Rehab Potential  Good    PT Frequency  1x / week    PT Duration  8 weeks    PT Treatment/Interventions  ADLs/Self Care Home Management;Cryotherapy;Electrical Stimulation;Gait training;Balance training;Therapeutic exercise;Therapeutic activities;Functional mobility training;Stair training;Patient/family education;Manual techniques;Vasopneumatic Device;Passive range of motion;Taping    PT Next Visit Plan  R ankle stability, strength and ROM       Patient will benefit from skilled therapeutic intervention in order to improve the following deficits and impairments:  Abnormal gait, Decreased activity tolerance, Decreased balance, Decreased mobility, Decreased strength, Increased edema, Impaired flexibility, Pain, Decreased range of motion, Difficulty walking  Visit Diagnosis: Stiffness of right ankle, not elsewhere classified  Localized osteoarthritis of right ankle  Pain in right ankle and joints of right foot  Difficulty in walking, not  elsewhere classified  Localized edema     Problem List Patient Active Problem List   Diagnosis Date Noted  . Localized osteoarthritis of right ankle 10/02/2017  .  Retained orthopedic hardware 10/02/2017  . Bimalleolar ankle fracture, right, closed, initial encounter 03/20/2017  . Bimalleolar fracture of right ankle 03/20/2017  . Depression with anxiety 12/25/2011  . HTN (hypertension) 12/25/2011  . GERD (gastroesophageal reflux disease) 12/25/2011  . COPD, moderate ([place]) 12/25/2011  . Chronic constipation 12/25/2011    Scot Jun, PTA 12/18/2017, 12:33 PM  [place], [place], [address] Phone: [phone]   Fax:  [phone]  Name: [name] MRN: [id number] Date of Birth: 1946/03/04

## 2017-12-19 ENCOUNTER — Encounter: Payer: Self-pay | Admitting: Physical Therapy

## 2017-12-19 ENCOUNTER — Ambulatory Visit: Payer: Medicare Other | Admitting: Physical Therapy

## 2017-12-19 DIAGNOSIS — M25571 Pain in right ankle and joints of right foot: Secondary | ICD-10-CM

## 2017-12-19 DIAGNOSIS — M25671 Stiffness of right ankle, not elsewhere classified: Secondary | ICD-10-CM

## 2017-12-19 DIAGNOSIS — M19071 Primary osteoarthritis, right ankle and foot: Secondary | ICD-10-CM

## 2017-12-19 DIAGNOSIS — R262 Difficulty in walking, not elsewhere classified: Secondary | ICD-10-CM

## 2017-12-19 DIAGNOSIS — R600 Localized edema: Secondary | ICD-10-CM

## 2017-12-19 DIAGNOSIS — R2689 Other abnormalities of gait and mobility: Secondary | ICD-10-CM

## 2017-12-19 NOTE — Addendum Note (Signed)
Addended by: Sumner Boast on: 12/19/2017 12:43 PM   Modules accepted: Orders

## 2017-12-19 NOTE — Therapy (Signed)
[place], [place], [address] Phone: [phone]   Fax:  [phone]  Physical Therapy Treatment  Patient Details  Name: [name] MRN: [id number] Date of Birth: 1946/02/05 Referring Provider: Dr. Berenice Primas   Encounter Date: 12/19/2017  PT End of Session - 12/19/17 1221    Visit Number  16    Date for PT Re-Evaluation  12/19/17    PT Start Time  1142    PT Stop Time  1235    PT Time Calculation (min)  53 min    Activity Tolerance  Patient tolerated treatment well    Behavior During Therapy  [facility] for tasks assessed/performed       Past Medical History:  Diagnosis Date  . Allergy   . Ankle fracture 03/16/2017   right  . Bulging lumbar disc    L4 OR L5 TAKES STEROID INJECTIONS FOR TOOK INJECTION MARCH 2019  . Chronic bronchitis ([place]) FEW YEARS AGO  . Chronic hepatitis C ([place])    TOOK HARVONI TX 2017   . Colon polyp   . COPD (chronic obstructive pulmonary disease) ([place])   . Diverticulitis 58YRS AGO  . Dyspnea    WITH HEAVY EXERTION  . Emphysema of lung (H[facility])    TOOK PULMONARY FUCTION TEST AND PASSED FEW WEEKS AGO AT FAMILY  MD  . Gallstones   . GERD (gastroesophageal reflux disease)   . History of kidney stones 20 YRS AGO   . Hypertension   . Osteoarthritis   . Pneumonia AT BIRTH  . Spondylosis of cervical region without myelopathy or radiculopathy     Past Surgical History:  Procedure Laterality Date  . ABDOMINAL HYSTERECTOMY  1980   PARTIAL  . ANKLE ARTHROSCOPY Right 10/02/2017   Procedure: RIGHT ANKLE ARTHROSCOPY WITH EXTENSIVE DEBRIDEMENT;  Surgeon: Dorna Leitz, MD;  Location: [place];  Service: Orthopedics;  Laterality: Right;  90 MINUTES FOR CASE  . BREAST BIOPSY  02/07/2010  . BREAST SURGERY  2011   biopsy  . CHOLECYSTECTOMY  1999   LAPAROSCOPIC  . HARDWARE REMOVAL Right 10/02/2017   Procedure: HARDWARE REMOVAL;  Surgeon: Dorna Leitz, MD;  Location:  [facility];  Service: Orthopedics;  Laterality: Right;  . ORIF ANKLE FRACTURE Right 03/20/2017   Procedure: OPEN REDUCTION INTERNAL FIXATION (ORIF) ANKLE FRACTURE;  Surgeon: Dorna Leitz, MD;  Location: [facility] [place];  Service: Orthopedics;  Laterality: Right;  . TONSILLECTOMY AND ADENOIDECTOMY  1950's    There were no vitals filed for this visit.  Subjective Assessment - 12/19/17 1150    Subjective  "Ok a little pain, not much"    Currently in Pain?  Yes    Pain Score  2     Pain Location  Ankle    Pain Orientation  Right                       [facility] Adult PT Treatment/Exercise - 12/19/17 0001      Ambulation/Gait   Gait Comments  one flight then back down with a slow jog up hill      Vasopneumatic   Number Minutes Vasopneumatic   15 minutes    Vasopnuematic Location   Ankle    Vasopneumatic Pressure  Medium    Vasopneumatic Temperature   32      Ankle Exercises: Aerobic   Elliptical  I=10 R=5 2fd/3ev     Recumbent Bike  L2x  4 min      Ankle Exercises: Standing   Other Standing Ankle Exercises  resisted gait 4 way 40lb x3 each    Other Standing Ankle Exercises  sit to stand from low surface 2x5       Ankle Exercises: Machines for Strengthening   Cybex Leg Press  50lb 2x15, Heel raises 50lb 2x15                PT Short Term Goals - 10/24/17 1315      PT SHORT TERM GOAL #1   Title  patient to be independent with initial HEP    Status  New    Target Date  11/07/17        PT Long Term Goals - 12/12/17 1518      PT LONG TERM GOAL #1   Title  patient to be independent with advanced HEP    Status  Achieved      PT LONG TERM GOAL #2   Title  patient to improve R ankle AROM to [facility] to allow for improved gait mechanics    Status  Partially Met      PT LONG TERM GOAL #3   Title  patient to demonstrate good heel strike and appropriate gait mechanics with good stability and minimal deviation    Status  Partially Met       PT LONG TERM GOAL #4   Title  patient to improve R ankle strength to >/= 4/5    Status  Partially Met            Plan - 12/19/17 1222    Clinical Impression Statement  Pt is doing well, she still compensates descending stairs due to R ankle dorsiflexion limitations. Balance instabilities noted with resisted side stepping. Fatigues quick with light up hill jogging.     Rehab Potential  Good    PT Frequency  1x / week    PT Duration  8 weeks    PT Treatment/Interventions  ADLs/Self Care Home Management;Cryotherapy;Electrical Stimulation;Gait training;Balance training;Therapeutic exercise;Therapeutic activities;Functional mobility training;Stair training;Patient/family education;Manual techniques;Vasopneumatic Device;Passive range of motion;Taping    PT Next Visit Plan  R ankle stability, strength and ROM       Patient will benefit from skilled therapeutic intervention in order to improve the following deficits and impairments:  Abnormal gait, Decreased activity tolerance, Decreased balance, Decreased mobility, Decreased strength, Increased edema, Impaired flexibility, Pain, Decreased range of motion, Difficulty walking  Visit Diagnosis: Stiffness of right ankle, not elsewhere classified  Localized osteoarthritis of right ankle  Pain in right ankle and joints of right foot  Difficulty in walking, not elsewhere classified  Localized edema  Other abnormalities of gait and mobility     Problem List Patient Active Problem List   Diagnosis Date Noted  . Localized osteoarthritis of right ankle 10/02/2017  . Retained orthopedic hardware 10/02/2017  . Bimalleolar ankle fracture, right, closed, initial encounter 03/20/2017  . Bimalleolar fracture of right ankle 03/20/2017  . Depression with anxiety 12/25/2011  . HTN (hypertension) 12/25/2011  . GERD (gastroesophageal reflux disease) 12/25/2011  . COPD, moderate ([place]) 12/25/2011  . Chronic constipation 12/25/2011     Scot Jun, PTA 12/19/2017, 12:26 PM  [place] [address] [place], [place], [address] Phone: [phone]   Fax:  [phone]  Name: [name] MRN: [id number] Date of Birth: [birth year]

## 2018-01-07 ENCOUNTER — Encounter: Payer: Self-pay | Admitting: Physical Therapy

## 2018-01-07 ENCOUNTER — Ambulatory Visit: Payer: Medicare Other | Attending: Orthopedic Surgery | Admitting: Physical Therapy

## 2018-01-07 DIAGNOSIS — R2689 Other abnormalities of gait and mobility: Secondary | ICD-10-CM | POA: Insufficient documentation

## 2018-01-07 DIAGNOSIS — R262 Difficulty in walking, not elsewhere classified: Secondary | ICD-10-CM | POA: Diagnosis present

## 2018-01-07 DIAGNOSIS — R600 Localized edema: Secondary | ICD-10-CM | POA: Diagnosis present

## 2018-01-07 DIAGNOSIS — M25571 Pain in right ankle and joints of right foot: Secondary | ICD-10-CM | POA: Insufficient documentation

## 2018-01-07 DIAGNOSIS — M19071 Primary osteoarthritis, right ankle and foot: Secondary | ICD-10-CM | POA: Insufficient documentation

## 2018-01-07 DIAGNOSIS — M25671 Stiffness of right ankle, not elsewhere classified: Secondary | ICD-10-CM | POA: Insufficient documentation

## 2018-01-07 NOTE — Therapy (Signed)
[place], [place], [address] Phone: [phone]   Fax:  [phone]  Physical Therapy Treatment  Patient Details  Name: [name] MRN: [id number] Date of Birth: [birth year] Referring Provider: Dr. Berenice Primas   Encounter Date: 01/07/2018  PT End of Session - 01/07/18 1103    Visit Number  17    Date for PT Re-Evaluation  01/18/18    PT Start Time  1018    PT Stop Time  1115    PT Time Calculation (min)  57 min    Activity Tolerance  Patient tolerated treatment well    Behavior During Therapy  [facility] for tasks assessed/performed       Past Medical History:  Diagnosis Date  . Allergy   . Ankle fracture 03/16/2017   right  . Bulging lumbar disc    L4 OR L5 TAKES STEROID INJECTIONS FOR TOOK INJECTION MARCH 2019  . Chronic bronchitis ([place]) FEW YEARS AGO  . Chronic hepatitis C ([place])    TOOK HARVONI TX 2017   . Colon polyp   . COPD (chronic obstructive pulmonary disease) ([place])   . Diverticulitis 28YRS AGO  . Dyspnea    WITH HEAVY EXERTION  . Emphysema of lung (HCC)    TOOK PULMONARY FUCTION TEST AND PASSED FEW WEEKS AGO AT FAMILY  MD  . Gallstones   . GERD (gastroesophageal reflux disease)   . History of kidney stones 20 YRS AGO   . Hypertension   . Osteoarthritis   . Pneumonia AT BIRTH  . Spondylosis of cervical region without myelopathy or radiculopathy     Past Surgical History:  Procedure Laterality Date  . ABDOMINAL HYSTERECTOMY  1980   PARTIAL  . ANKLE ARTHROSCOPY Right 10/02/2017   Procedure: RIGHT ANKLE ARTHROSCOPY WITH EXTENSIVE DEBRIDEMENT;  Surgeon: Dorna Leitz, MD;  Location: [place];  Service: Orthopedics;  Laterality: Right;  90 MINUTES FOR CASE  . BREAST BIOPSY  02/07/2010  . BREAST SURGERY  2011   biopsy  . CHOLECYSTECTOMY  1999   LAPAROSCOPIC  . HARDWARE REMOVAL Right 10/02/2017   Procedure: HARDWARE REMOVAL;  Surgeon: Dorna Leitz, MD;  Location:  [facility];  Service: Orthopedics;  Laterality: Right;  . ORIF ANKLE FRACTURE Right 03/20/2017   Procedure: OPEN REDUCTION INTERNAL FIXATION (ORIF) ANKLE FRACTURE;  Surgeon: Dorna Leitz, MD;  Location: [place];  Service: Orthopedics;  Laterality: Right;  . TONSILLECTOMY AND ADENOIDECTOMY  1950's    There were no vitals filed for this visit.  Subjective Assessment - 01/07/18 1021    Subjective  Pt reports that she has no limitations in home, reports some difficulty walking in grass.      Currently in Pain?  No/denies "No yet, but it looks like it, See the dark clouds"                       [facility] Adult PT Treatment/Exercise - 01/07/18 0001      Ambulation/Gait   Gait Comments  2 flights of stairs then back down outside on uneven terrain. Reprots difficulty ambulating horrizontal on slants       High Level Balance   High Level Balance Comments  BOSU standing x68mn      Vasopneumatic   Number Minutes Vasopneumatic   15 minutes    Vasopnuematic Location   Ankle    Vasopneumatic Pressure  Medium    Vasopneumatic Temperature  32      Ankle Exercises: Aerobic   Elliptical  I=13 R=7 52fd/3ev     Recumbent Bike  L2x 4 min      Ankle Exercises: Standing   Heel Raises  Both;2 seconds;15 reps x2    Other Standing Ankle Exercises  6 in step downs trying to keep R heel down 2x10    Other Standing Ankle Exercises  sit to stand from low surface 2x5       Ankle Exercises: Machines for Strengthening   Cybex Leg Press  50lb 2x15, Heel raises 50lb 2x15       Ankle Exercises: Stretches   Gastroc Stretch  20 seconds;5 reps               PT Short Term Goals - 10/24/17 1315      PT SHORT TERM GOAL #1   Title  patient to be independent with initial HEP    Status  New    Target Date  11/07/17        PT Long Term Goals - 12/12/17 1518      PT LONG TERM GOAL #1   Title  patient to be independent with advanced HEP    Status   Achieved      PT LONG TERM GOAL #2   Title  patient to improve R ankle [facility] to [facility] allow for improved gait mechanics    Status  Partially Met      PT LONG TERM GOAL #3   Title  patient to demonstrate good heel strike and appropriate gait mechanics with good stability and minimal deviation    Status  Partially Met      PT LONG TERM GOAL #4   Title  patient to improve R ankle strength to >/= 4/5    Status  Partially Met            Plan - 01/07/18 1104    Clinical Impression Statement  Pt continues to have a little compensation descending stairs due to lack of R ankle DF. Reports difficulty ambulating on slopes. Did jog again today up hill with no issues reported.     Rehab Potential  Good    PT Frequency  1x / week    PT Duration  8 weeks    PT Treatment/Interventions  ADLs/Self Care Home Management;Cryotherapy;Electrical Stimulation;Gait training;Balance training;Therapeutic exercise;Therapeutic activities;Functional mobility training;Stair training;Patient/family education;Manual techniques;Vasopneumatic Device;Passive range of motion;Taping    PT Next Visit Plan  R ankle stability, strength and ROM       Patient will benefit from skilled therapeutic intervention in order to improve the following deficits and impairments:  Abnormal gait, Decreased activity tolerance, Decreased balance, Decreased mobility, Decreased strength, Increased edema, Impaired flexibility, Pain, Decreased range of motion, Difficulty walking  Visit Diagnosis: Stiffness of right ankle, not elsewhere classified  Localized osteoarthritis of right ankle  Pain in right ankle and joints of right foot     Problem List Patient Active Problem List   Diagnosis Date Noted  . Localized osteoarthritis of right ankle 10/02/2017  . Retained orthopedic hardware 10/02/2017  . Bimalleolar ankle fracture, right, closed, initial encounter 03/20/2017  . Bimalleolar fracture of right ankle 03/20/2017  .  Depression with anxiety 12/25/2011  . HTN (hypertension) 12/25/2011  . GERD (gastroesophageal reflux disease) 12/25/2011  . COPD, moderate ([place] 12/25/2011  . Chronic constipation 12/25/2011    RScot Jun PTA 01/07/2018, 11:07 AM  [phone].  [facility] [place], [place], [address] Phone: [phone]   Fax:  [phone]  Name: Alisse Tuite MRN: [id number] Date of Birth: Feb 17, 1946

## 2018-01-14 ENCOUNTER — Encounter: Payer: Self-pay | Admitting: Physical Therapy

## 2018-01-14 ENCOUNTER — Ambulatory Visit: Payer: Medicare Other | Admitting: Physical Therapy

## 2018-01-14 DIAGNOSIS — M19071 Primary osteoarthritis, right ankle and foot: Secondary | ICD-10-CM

## 2018-01-14 DIAGNOSIS — M25671 Stiffness of right ankle, not elsewhere classified: Secondary | ICD-10-CM | POA: Diagnosis not present

## 2018-01-14 DIAGNOSIS — R600 Localized edema: Secondary | ICD-10-CM

## 2018-01-14 DIAGNOSIS — R262 Difficulty in walking, not elsewhere classified: Secondary | ICD-10-CM

## 2018-01-14 DIAGNOSIS — M25571 Pain in right ankle and joints of right foot: Secondary | ICD-10-CM

## 2018-01-14 DIAGNOSIS — R2689 Other abnormalities of gait and mobility: Secondary | ICD-10-CM

## 2018-01-14 NOTE — Therapy (Signed)
[place], [place], [address] Phone: [phone]   Fax:  [phone]  Physical Therapy Treatment  Patient Details  Name: [name] MRN: [id number] Date of Birth: 1946/02/05 Referring Provider: Dr. Berenice Primas   Encounter Date: 01/14/2018  PT End of Session - 01/14/18 1056    Visit Number  18    Date for PT Re-Evaluation  01/18/18    PT Start Time  1020    PT Stop Time  1100    PT Time Calculation (min)  40 min    Activity Tolerance  Patient tolerated treatment well    Behavior During Therapy  [facility] for tasks assessed/performed       Past Medical History:  Diagnosis Date  . Allergy   . Ankle fracture 03/16/2017   right  . Bulging lumbar disc    L4 OR L5 TAKES STEROID INJECTIONS FOR TOOK INJECTION MARCH 2019  . Chronic bronchitis ([place]) FEW YEARS AGO  . Chronic hepatitis C ([place])    TOOK HARVONI TX 2017   . Colon polyp   . COPD (chronic obstructive pulmonary disease) ([place])   . Diverticulitis 21YRS AGO  . Dyspnea    WITH HEAVY EXERTION  . Emphysema of lung (HCC)    TOOK PULMONARY FUCTION TEST AND PASSED FEW WEEKS AGO AT FAMILY  MD  . Gallstones   . GERD (gastroesophageal reflux disease)   . History of kidney stones 20 YRS AGO   . Hypertension   . Osteoarthritis   . Pneumonia AT BIRTH  . Spondylosis of cervical region without myelopathy or radiculopathy     Past Surgical History:  Procedure Laterality Date  . ABDOMINAL HYSTERECTOMY  1980   PARTIAL  . ANKLE ARTHROSCOPY Right 10/02/2017   Procedure: RIGHT ANKLE ARTHROSCOPY WITH EXTENSIVE DEBRIDEMENT;  Surgeon: Dorna Leitz, MD;  Location: [place];  Service: Orthopedics;  Laterality: Right;  90 MINUTES FOR CASE  . BREAST BIOPSY  02/07/2010  . BREAST SURGERY  2011   biopsy  . CHOLECYSTECTOMY  1999   LAPAROSCOPIC  . HARDWARE REMOVAL Right 10/02/2017   Procedure: HARDWARE REMOVAL;  Surgeon: Dorna Leitz, MD;  Location:  [facility];  Service: Orthopedics;  Laterality: Right;  . ORIF ANKLE FRACTURE Right 03/20/2017   Procedure: OPEN REDUCTION INTERNAL FIXATION (ORIF) ANKLE FRACTURE;  Surgeon: Dorna Leitz, MD;  Location: [place];  Service: Orthopedics;  Laterality: Right;  . TONSILLECTOMY AND ADENOIDECTOMY  1950's    There were no vitals filed for this visit.  Subjective Assessment - 01/14/18 1027    Subjective  "Today is my last day" "It hurts"    Currently in Pain?  No/denies    Pain Score  0-No pain         [facility] PT Assessment - 01/14/18 0001      ROM / Strength   AROM / PROM / Strength  Strength      AROM   Right/Left Ankle  Right    Right Ankle Dorsiflexion  11    Right Ankle Plantar Flexion  67      Strength   Right Ankle Dorsiflexion  4+/5    Right Ankle Plantar Flexion  4+/5    Right Ankle Inversion  4+/5    Right Ankle Eversion  4+/5                   [facility] Adult PT Treatment/Exercise - 01/14/18 0001  Ambulation/Gait   Ambulation/Gait  Yes    Stairs  Yes    Gait Comments  2 flights of stairs then back down outside on uneven terrain up slope      High Level Balance   High Level Balance Comments  treadmill push fwd/bak      Manual Therapy   Manual Therapy  Passive ROM    Passive ROM  R ankle all motions       Ankle Exercises: Aerobic   Elliptical  I=17 R=7 57fd/2ev     Recumbent Bike  L2x 4 min      Ankle Exercises: Standing   Toe Raise  20 reps;2 seconds      Ankle Exercises: Seated   Other Seated Ankle Exercises  sit to stand from low surface                PT Short Term Goals - 10/24/17 1315      PT SHORT TERM GOAL #1   Title  patient to be independent with initial HEP    Status  New    Target Date  11/07/17        PT Long Term Goals - 01/14/18 1104      PT LONG TERM GOAL #1   Title  patient to be independent with advanced HEP    Status  Achieved      PT LONG TERM GOAL #2   Title  patient to  improve R ankle AROM to [facility] allow for improved gait mechanics    Status  Partially Met      PT LONG TERM GOAL #3   Title  patient to demonstrate good heel strike and appropriate gait mechanics with good stability and minimal deviation    Status  Partially Met      PT LONG TERM GOAL #4   Title  patient to improve R ankle strength to >/= 4/5    Status  Achieved      PT LONG TERM GOAL #5   Title  increase strength to 4/5    Status  Achieved            Plan - 01/14/18 1058    Clinical Impression Statement  Pt tolerated all of today's interventions well. Pt still has some compensation negotiating stairs due to limited R ankle dorsiflexion, all other [facility] motion [facility] She reports some difficulty ambulating on grass and uneven surfaces. R ankle is still lacking ~ 9 to 10 degrees of DF.      Rehab Potential  Good    PT Frequency  1x / week    PT Duration  8 weeks    PT Treatment/Interventions  ADLs/Self Care Home Management;Cryotherapy;Electrical Stimulation;Gait training;Balance training;Therapeutic exercise;Therapeutic activities;Functional mobility training;Stair training;Patient/family education;Manual techniques;Vasopneumatic Device;Passive range of motion;Taping    PT Next Visit Plan  D/C PT, pt will try to remain active on her own to maintain function.        Patient will benefit from skilled therapeutic intervention in order to improve the following deficits and impairments:  Abnormal gait, Decreased activity tolerance, Decreased balance, Decreased mobility, Decreased strength, Increased edema, Impaired flexibility, Pain, Decreased range of motion, Difficulty walking  Visit Diagnosis: Stiffness of right ankle, not elsewhere classified  Localized osteoarthritis of right ankle  Pain in right ankle and joints of right foot  Localized edema  Difficulty in walking, not elsewhere classified  Other abnormalities of gait and mobility     Problem List Patient Active  Problem  List   Diagnosis Date Noted  . Localized osteoarthritis of right ankle 10/02/2017  . Retained orthopedic hardware 10/02/2017  . Bimalleolar ankle fracture, right, closed, initial encounter 03/20/2017  . Bimalleolar fracture of right ankle 03/20/2017  . Depression with anxiety 12/25/2011  . HTN (hypertension) 12/25/2011  . GERD (gastroesophageal reflux disease) 12/25/2011  . COPD, moderate ([place]) 12/25/2011  . Chronic constipation 12/25/2011   PHYSICAL THERAPY DISCHARGE SUMMARY  Visits from Start of Care: 18  Plan: Patient agrees to discharge.  Patient goals were partially met. Patient is being discharged due to financial reasons.  ?????     Scot Jun, PTA 01/14/2018, 11:06 AM  [place] [address] [place], [place], [address] Phone: [phone]   Fax:  [phone]  Name: Kennetta Pavlovic MRN: [id number] Date of Birth: 22-Sep-1945

## 2018-06-09 ENCOUNTER — Ambulatory Visit: Payer: Medicare Other | Attending: Orthopedic Surgery | Admitting: Physical Therapy

## 2018-06-09 ENCOUNTER — Encounter: Payer: Self-pay | Admitting: Physical Therapy

## 2018-06-09 ENCOUNTER — Other Ambulatory Visit: Payer: Self-pay

## 2018-06-09 DIAGNOSIS — R262 Difficulty in walking, not elsewhere classified: Secondary | ICD-10-CM | POA: Diagnosis present

## 2018-06-09 DIAGNOSIS — M25571 Pain in right ankle and joints of right foot: Secondary | ICD-10-CM | POA: Insufficient documentation

## 2018-06-09 DIAGNOSIS — R600 Localized edema: Secondary | ICD-10-CM | POA: Insufficient documentation

## 2018-06-09 DIAGNOSIS — M25671 Stiffness of right ankle, not elsewhere classified: Secondary | ICD-10-CM | POA: Diagnosis present

## 2018-06-09 NOTE — Therapy (Signed)
[place], [place], [address] Phone: [phone]   Fax:  [phone]  Physical Therapy Evaluation  Patient Details  Name: [name] MRN: [id number] Date of Birth: 1946-05-11 Referring Provider (PT): Dr. Berenice Primas   Encounter Date: 06/09/2018  PT End of Session - 06/09/18 1343    Visit Number  1    Date for PT Re-Evaluation  08/10/18    PT Start Time  1308    PT Stop Time  1405    PT Time Calculation (min)  57 min    Activity Tolerance  Patient tolerated treatment well    Behavior During Therapy  [facility] for tasks assessed/performed       Past Medical History:  Diagnosis Date  . Allergy   . Ankle fracture 03/16/2017   right  . Bulging lumbar disc    L4 OR L5 TAKES STEROID INJECTIONS FOR TOOK INJECTION MARCH 2019  . Chronic bronchitis ([place]) FEW YEARS AGO  . Chronic hepatitis C ([place])    TOOK HARVONI TX 2017   . Colon polyp   . COPD (chronic obstructive pulmonary disease) ([place])   . Diverticulitis 75YRS AGO  . Dyspnea    WITH HEAVY EXERTION  . Emphysema of lung (HCC)    TOOK PULMONARY FUCTION TEST AND PASSED FEW WEEKS AGO AT FAMILY  MD  . Gallstones   . GERD (gastroesophageal reflux disease)   . History of kidney stones 20 YRS AGO   . Hypertension   . Osteoarthritis   . Pneumonia AT BIRTH  . Spondylosis of cervical region without myelopathy or radiculopathy     Past Surgical History:  Procedure Laterality Date  . ABDOMINAL HYSTERECTOMY  1980   PARTIAL  . ANKLE ARTHROSCOPY Right 10/02/2017   Procedure: RIGHT ANKLE ARTHROSCOPY WITH EXTENSIVE DEBRIDEMENT;  Surgeon: Dorna Leitz, MD;  Location: [place];  Service: Orthopedics;  Laterality: Right;  90 MINUTES FOR CASE  . BREAST BIOPSY  02/07/2010  . BREAST SURGERY  2011   biopsy  . CHOLECYSTECTOMY  1999   LAPAROSCOPIC  . HARDWARE REMOVAL Right 10/02/2017   Procedure: HARDWARE REMOVAL;  Surgeon: Dorna Leitz, MD;  Location:  [facility];  Service: Orthopedics;  Laterality: Right;  . ORIF ANKLE FRACTURE Right 03/20/2017   Procedure: OPEN REDUCTION INTERNAL FIXATION (ORIF) ANKLE FRACTURE;  Surgeon: Dorna Leitz, MD;  Location: [place];  Service: Orthopedics;  Laterality: Right;  . TONSILLECTOMY AND ADENOIDECTOMY  1950's    There were no vitals filed for this visit.   Subjective Assessment - 06/09/18 1311    Subjective  Patient broke her right ankle in September 2018.  She had ORIF at that time.  She had the hardware removed with a scope of the ankle in April 2019.  WE saw her after the hardware removal and was doing very well.  We discharged her with all goals met.  She reports that she has been to the gym but feels like over the past few months she has had increased pain and some difficulty walking and especially stairs.  She report sthat she is still on pain meds and pain cream.  She c/o tenderness and tightness in the right posterior and lateral ankle    Limitations  Walking    Patient Stated Goals  have less pain, not take pain meds, no problems with stairs, not problems with walking on uneven surfaces    Currently in Pain?  Yes  Pain Score  2     Pain Location  Ankle    Pain Orientation  Right;Lateral    Pain Descriptors / Indicators  Aching;Tightness;Sore    Pain Type  Chronic pain    Pain Onset  More than a month ago    Pain Frequency  Constant    Aggravating Factors   worse with stairs, walking on uneven ground, touching the ankle, stretching, the pain is up to 8/10    Pain Relieving Factors  rest, pain meds and pain cream pain can be 0/10    Effect of Pain on Daily Activities  difficulty walking on uneven ground, stairs, does not like to lay on the right side         [facility] PT Assessment - 06/09/18 0001      Observation/Other Assessments-Edema    Edema  Circumferential      Circumferential Edema   Circumferential - Right  25 cm mid malleoli    Circumferential -  Left   23.75 cm      ROM / Strength   AROM / PROM / Strength  AROM;PROM;Strength      AROM   Overall AROM Comments  some pain iwth active motions    AROM Assessment Site  Ankle    Right/Left Ankle  Right    Right Ankle Dorsiflexion  0   5 degrees from neutral   Right Ankle Plantar Flexion  25    Right Ankle Inversion  20    Right Ankle Eversion  0      PROM   Overall PROM Comments  all PROM is painful to the end range    PROM Assessment Site  Ankle    Right/Left Ankle  Right    Right Ankle Dorsiflexion  0    Right Ankle Plantar Flexion  25    Right Ankle Inversion  25    Right Ankle Eversion  5      Strength   Overall Strength Comments  4/5 with "pulling for PF/DF, 4-/5 for inversion and eversion with c/o pulling and pain      Flexibility   Soft Tissue Assessment /Muscle Length  --   tight calves and peroneals     Palpation   Palpation comment  she has swelling in the right lateral ankle, she is very tender from the lateral malleolous up the lateral ankle and shin , very tender right achilles      Special Tests   Other special tests  she cannot stand on the right foot without holding on to something, she cannot stand on the right leg SLS greater than 1 second due to fear and some pain      Ambulation/Gait   Gait Comments  no device, slight toe out, limps on the right worse when she first gets up and then it gets better but does not go away, the first stair going down she really favors it and has to lean away. then it gets a little better but again does not go away                Objective measurements completed on examination: See above findings.      [facility] Adult PT Treatment/Exercise - 06/09/18 0001      Modalities   Modalities  Electrical Stimulation;Vasopneumatic      Electrical Stimulation   Electrical Stimulation Location  right lateral ankle    Electrical Stimulation Action  IFC    Electrical Stimulation Parameters  elevated  Electrical  Stimulation Goals  Edema;Pain      Vasopneumatic   Number Minutes Vasopneumatic   15 minutes    Vasopnuematic Location   Ankle    Vasopneumatic Pressure  Medium    Vasopneumatic Temperature   34               PT Short Term Goals - 06/09/18 1351      PT SHORT TERM GOAL #1   Title  patient to be independent with initial HEP    Time  2    Period  Weeks    Status  New        PT Long Term Goals - 06/09/18 1351      PT LONG TERM GOAL #1   Title  patient to be independent with advanced HEP    Time  8    Period  Weeks    Status  New      PT LONG TERM GOAL #2   Title  patient to improve R ankle AROM to [facility] to allow for improved gait mechanics    Time  8    Period  Weeks    Status  New      PT LONG TERM GOAL #3   Title  patient to demonstrate good heel strike and appropriate gait mechanics with good stability and minimal deviation on level and unlevel surfaces    Time  8    Period  Weeks    Status  New      PT LONG TERM GOAL #4   Title  patient to improve R ankle strength to >/= 4/5    Time  8    Period  Weeks    Status  New      PT LONG TERM GOAL #5   Title  stand on the right leg >10 seconds without holding on    Time  8    Period  Weeks    Status  New             Plan - 06/09/18 1344    Clinical Impression Statement  Patient broke her right ankle in September 2018, she had ORIF at that time.  She had hardware removal April 2019.  She saw [place] and was doing great.  Reports that over the past few months she had increased pain, difficulty with walking, especially uneven ground and stairs, she has swelling and is very tender in the lateral ankle.  She has limited ROM for DF and eversion.  She could not stand on the right leg without assist.    Clinical Presentation  Stable    Clinical Decision Making  Low    Rehab Potential  Good    PT Frequency  2x / week    PT Duration  8 weeks    PT Treatment/Interventions  ADLs/Self Care Home  Management;Cryotherapy;Electrical Stimulation;Iontophoresis 52m/ml Dexamethasone;Gait training;Stair training;Functional mobility training;Therapeutic activities;Therapeutic exercise;Balance training;Neuromuscular re-education;Patient/family education;Manual techniques;Vasopneumatic Device    PT Next Visit Plan  work on ROM, could try desensitization    Consulted and Agree with Plan of Care  Patient       Patient will benefit from skilled therapeutic intervention in order to improve the following deficits and impairments:  Abnormal gait, Decreased range of motion, Difficulty walking, Pain, Decreased activity tolerance, Decreased balance, Impaired flexibility, Decreased strength, Decreased mobility, Increased edema  Visit Diagnosis: Stiffness of right ankle, not elsewhere classified - Plan: PT plan of care cert/re-cert  Pain in right ankle and  joints of right foot - Plan: PT plan of care cert/re-cert  Localized edema - Plan: PT plan of care cert/re-cert  Difficulty in walking, not elsewhere classified - Plan: PT plan of care cert/re-cert     Problem List Patient Active Problem List   Diagnosis Date Noted  . Localized osteoarthritis of right ankle 10/02/2017  . Retained orthopedic hardware 10/02/2017  . Bimalleolar ankle fracture, right, closed, initial encounter 03/20/2017  . Bimalleolar fracture of right ankle 03/20/2017  . Depression with anxiety 12/25/2011  . HTN (hypertension) 12/25/2011  . GERD (gastroesophageal reflux disease) 12/25/2011  . COPD, moderate ([place]) 12/25/2011  . Chronic constipation 12/25/2011    Sumner Boast., PT 06/09/2018, 2:49 PM  [place] [address] [place], [place], [address] Phone: [phone]   Fax:  [phone]  Name: [name] MRN: [id number] Date of Birth: 28-Dec-1945

## 2018-06-12 ENCOUNTER — Encounter: Payer: Self-pay | Admitting: Physical Therapy

## 2018-06-12 ENCOUNTER — Ambulatory Visit: Payer: Medicare Other | Admitting: Physical Therapy

## 2018-06-12 DIAGNOSIS — R262 Difficulty in walking, not elsewhere classified: Secondary | ICD-10-CM

## 2018-06-12 DIAGNOSIS — R600 Localized edema: Secondary | ICD-10-CM

## 2018-06-12 DIAGNOSIS — M25671 Stiffness of right ankle, not elsewhere classified: Secondary | ICD-10-CM

## 2018-06-12 DIAGNOSIS — M25571 Pain in right ankle and joints of right foot: Secondary | ICD-10-CM

## 2018-06-12 NOTE — Therapy (Signed)
[place], [place], [address] Phone: [phone]   Fax:  [phone]  Physical Therapy Treatment  Patient Details  Name: [name] MRN: [id number] Date of Birth: 12/11/1945 Referring Provider (PT): Dr. Berenice Primas   Encounter Date: 06/12/2018  PT End of Session - 06/12/18 0840    Visit Number  2    Date for PT Re-Evaluation  08/10/18    PT Start Time  0802    PT Stop Time  8099    PT Time Calculation (min)  55 min    Activity Tolerance  Patient tolerated treatment well    Behavior During Therapy  [facility] for tasks assessed/performed       Past Medical History:  Diagnosis Date  . Allergy   . Ankle fracture 03/16/2017   right  . Bulging lumbar disc    L4 OR L5 TAKES STEROID INJECTIONS FOR TOOK INJECTION MARCH 2019  . Chronic bronchitis ([place]) FEW YEARS AGO  . Chronic hepatitis C ([place])    TOOK HARVONI TX 2017   . Colon polyp   . COPD (chronic obstructive pulmonary disease) ([place])   . Diverticulitis 34YRS AGO  . Dyspnea    WITH HEAVY EXERTION  . Emphysema of lung (HCC)    TOOK PULMONARY FUCTION TEST AND PASSED FEW WEEKS AGO AT FAMILY  MD  . Gallstones   . GERD (gastroesophageal reflux disease)   . History of kidney stones 20 YRS AGO   . Hypertension   . Osteoarthritis   . Pneumonia AT BIRTH  . Spondylosis of cervical region without myelopathy or radiculopathy     Past Surgical History:  Procedure Laterality Date  . ABDOMINAL HYSTERECTOMY  1980   PARTIAL  . ANKLE ARTHROSCOPY Right 10/02/2017   Procedure: RIGHT ANKLE ARTHROSCOPY WITH EXTENSIVE DEBRIDEMENT;  Surgeon: Dorna Leitz, MD;  Location: [place];  Service: Orthopedics;  Laterality: Right;  90 MINUTES FOR CASE  . BREAST BIOPSY  02/07/2010  . BREAST SURGERY  2011   biopsy  . CHOLECYSTECTOMY  1999   LAPAROSCOPIC  . HARDWARE REMOVAL Right 10/02/2017   Procedure: HARDWARE REMOVAL;  Surgeon: Dorna Leitz, MD;  Location:  [facility];  Service: Orthopedics;  Laterality: Right;  . ORIF ANKLE FRACTURE Right 03/20/2017   Procedure: OPEN REDUCTION INTERNAL FIXATION (ORIF) ANKLE FRACTURE;  Surgeon: Dorna Leitz, MD;  Location: [place];  Service: Orthopedics;  Laterality: Right;  . TONSILLECTOMY AND ADENOIDECTOMY  1950's    There were no vitals filed for this visit.  Subjective Assessment - 06/12/18 0805    Subjective  Pt reports no change since evaluation. Pain in the R lateral, posterior ankle    Currently in Pain?  No/denies                       [facility] Adult PT Treatment/Exercise - 06/12/18 0001      Exercises   Exercises  Ankle      Vasopneumatic   Number Minutes Vasopneumatic   15 minutes    Vasopnuematic Location   Ankle    Vasopneumatic Pressure  Medium    Vasopneumatic Temperature   34      Manual Therapy   Manual Therapy  Passive ROM    Passive ROM  R ankle all directions      Ankle Exercises: Stretches   Soleus Stretch  4 reps;20 seconds    Gastroc Stretch  4 reps;20  seconds      Ankle Exercises: Aerobic   Recumbent Bike  L0 x 6 min       Ankle Exercises: Standing   Other Standing Ankle Exercises  RLE 4 in step downs x10      Ankle Exercises: Seated   Ankle Circles/Pumps  Right;AROM;15 reps    Other Seated Ankle Exercises  4 way ankle red tband x20               PT Short Term Goals - 06/12/18 0841      PT SHORT TERM GOAL #1   Title  patient to be independent with initial HEP    Status  Achieved        PT Long Term Goals - 06/09/18 1351      PT LONG TERM GOAL #1   Title  patient to be independent with advanced HEP    Time  8    Period  Weeks    Status  New      PT LONG TERM GOAL #2   Title  patient to improve R ankle AROM to [facility] to allow for improved gait mechanics    Time  8    Period  Weeks    Status  New      PT LONG TERM GOAL #3   Title  patient to demonstrate good heel strike and appropriate gait  mechanics with good stability and minimal deviation on level and unlevel surfaces    Time  8    Period  Weeks    Status  New      PT LONG TERM GOAL #4   Title  patient to improve R ankle strength to >/= 4/5    Time  8    Period  Weeks    Status  New      PT LONG TERM GOAL #5   Title  stand on the right leg >10 seconds without holding on    Time  8    Period  Weeks    Status  New            Plan - 06/12/18 5188    Clinical Impression Statement  Pt with some tenderness to posterior R ankle on  lateral side with light touch. She reports a pulling sensation in that area as well. Tightness with R ankle PF noted all other motions are ok passively. Limited eversion and PF noted with light resistance. Tactile cues needed with both gastroc and soleus stretching due to pt compensation. She often reports that he does not feel a  stretch on her own due to compensation.    PT Frequency  2x / week    PT Duration  8 weeks    PT Treatment/Interventions  ADLs/Self Care Home Management;Cryotherapy;Electrical Stimulation;Iontophoresis 4mg /ml Dexamethasone;Gait training;Stair training;Functional mobility training;Therapeutic activities;Therapeutic exercise;Balance training;Neuromuscular re-education;Patient/family education;Manual techniques;Vasopneumatic Device    PT Next Visit Plan  Continue work on ROM, could try desensitization       Patient will benefit from skilled therapeutic intervention in order to improve the following deficits and impairments:  Abnormal gait, Decreased range of motion, Difficulty walking, Pain, Decreased activity tolerance, Decreased balance, Impaired flexibility, Decreased strength, Decreased mobility, Increased edema  Visit Diagnosis: Stiffness of right ankle, not elsewhere classified  Pain in right ankle and joints of right foot  Localized edema  Difficulty in walking, not elsewhere classified     Problem List Patient Active Problem List   Diagnosis Date  Noted  . Localized  osteoarthritis of right ankle 10/02/2017  . Retained orthopedic hardware 10/02/2017  . Bimalleolar ankle fracture, right, closed, initial encounter 03/20/2017  . Bimalleolar fracture of right ankle 03/20/2017  . Depression with anxiety 12/25/2011  . HTN (hypertension) 12/25/2011  . GERD (gastroesophageal reflux disease) 12/25/2011  . COPD, moderate ([place]) 12/25/2011  . Chronic constipation 12/25/2011    Scot Jun, PTA 06/12/2018, 8:45 AM  [place] [address] [place], [place], [address] Phone: [phone]   Fax:  [phone]  Name: [name] MRN: [id number] Date of Birth: 07-05-1945

## 2018-06-17 ENCOUNTER — Encounter: Payer: Self-pay | Admitting: Physical Therapy

## 2018-06-17 ENCOUNTER — Ambulatory Visit: Payer: Medicare Other | Admitting: Physical Therapy

## 2018-06-17 ENCOUNTER — Ambulatory Visit: Payer: Medicare Other

## 2018-06-17 DIAGNOSIS — M25571 Pain in right ankle and joints of right foot: Secondary | ICD-10-CM

## 2018-06-17 DIAGNOSIS — M25671 Stiffness of right ankle, not elsewhere classified: Secondary | ICD-10-CM | POA: Diagnosis not present

## 2018-06-17 DIAGNOSIS — R600 Localized edema: Secondary | ICD-10-CM

## 2018-06-17 NOTE — Therapy (Signed)
[place], [place], [address] Phone: [phone]   Fax:  [phone]  Physical Therapy Treatment  Patient Details  Name: [name] MRN: [id number] Date of Birth: 1946/04/19 Referring Provider (PT): Dr. Berenice Primas   Encounter Date: 06/17/2018  PT End of Session - 06/17/18 1056    Visit Number  3    Date for PT Re-Evaluation  08/10/18    PT Start Time  3212    PT Stop Time  1111    PT Time Calculation (min)  56 min    Activity Tolerance  Patient tolerated treatment well    Behavior During Therapy  [facility] for tasks assessed/performed       Past Medical History:  Diagnosis Date  . Allergy   . Ankle fracture 03/16/2017   right  . Bulging lumbar disc    L4 OR L5 TAKES STEROID INJECTIONS FOR TOOK INJECTION MARCH 2019  . Chronic bronchitis ([employer]) FEW YEARS AGO  . Chronic hepatitis C ([place])    TOOK HARVONI TX 2017   . Colon polyp   . COPD (chronic obstructive pulmonary disease) ([place])   . Diverticulitis 62YRS AGO  . Dyspnea    WITH HEAVY EXERTION  . Emphysema of lung (HCC)    TOOK PULMONARY FUCTION TEST AND PASSED FEW WEEKS AGO AT FAMILY  MD  . Gallstones   . GERD (gastroesophageal reflux disease)   . History of kidney stones 20 YRS AGO   . Hypertension   . Osteoarthritis   . Pneumonia AT BIRTH  . Spondylosis of cervical region without myelopathy or radiculopathy     Past Surgical History:  Procedure Laterality Date  . ABDOMINAL HYSTERECTOMY  1980   PARTIAL  . ANKLE ARTHROSCOPY Right 10/02/2017   Procedure: RIGHT ANKLE ARTHROSCOPY WITH EXTENSIVE DEBRIDEMENT;  Surgeon: Dorna Leitz, MD;  Location: [place];  Service: Orthopedics;  Laterality: Right;  90 MINUTES FOR CASE  . BREAST BIOPSY  02/07/2010  . BREAST SURGERY  2011   biopsy  . CHOLECYSTECTOMY  1999   LAPAROSCOPIC  . HARDWARE REMOVAL Right 10/02/2017   Procedure: HARDWARE REMOVAL;  Surgeon: Dorna Leitz, MD;  [facility];  Service: Orthopedics;  Laterality: Right;  . ORIF ANKLE FRACTURE Right 03/20/2017   Procedure: OPEN REDUCTION INTERNAL FIXATION (ORIF) ANKLE FRACTURE;  Surgeon: Dorna Leitz, MD;  Location: [place];  Service: Orthopedics;  Laterality: Right;  . TONSILLECTOMY AND ADENOIDECTOMY  1950's    There were no vitals filed for this visit.  Subjective Assessment - 06/17/18 1015    Subjective  "I feel fine"    Currently in Pain?  No/denies                       [facility] Adult PT Treatment/Exercise - 06/17/18 0001      Ambulation/Gait   Gait Comments  @ flights of stairs some compensation descending due to decrease R ankle DF      Vasopneumatic   Number Minutes Vasopneumatic   15 minutes    Vasopnuematic Location   Ankle    Vasopneumatic Pressure  Medium    Vasopneumatic Temperature   34      Manual Therapy   Manual Therapy  Passive ROM    Manual therapy comments  some PROM taken to end range and held    Passive ROM  R ankle all directions      Ankle  Exercises: Aerobic   Recumbent Bike  Ll x 5 min       Ankle Exercises: Stretches   Gastroc Stretch  4 reps;20 seconds   pro stretch     Ankle Exercises: Standing   Rocker Board  3 minutes    Heel Raises  Both;15 reps;2 seconds   RLE 2x10     Other Standing Ankle Exercises  Resisted gait 40lb x5                PT Short Term Goals - 06/12/18 0841      PT SHORT TERM GOAL #1   Title  patient to be independent with initial HEP    Status  Achieved        PT Long Term Goals - 06/17/18 1059      PT LONG TERM GOAL #2   Title  patient to improve R ankle AROM to [facility] to allow for improved gait mechanics    Status  On-going            Plan - 06/17/18 1057    Clinical Impression Statement  Overall Pt does well functionally. A decrease in R ankle DF causes pt to compensation throughout treatment session. Ankle pain reported when descending stairs. Tactile cues  needed when doing all calf stretching to promoted more DF. Pt often times lean forward decreasing the stretch in her R ankle.     Rehab Potential  Good    PT Frequency  2x / week    PT Duration  8 weeks    PT Treatment/Interventions  ADLs/Self Care Home Management;Cryotherapy;Electrical Stimulation;Iontophoresis 4mg /ml Dexamethasone;Gait training;Stair training;Functional mobility training;Therapeutic activities;Therapeutic exercise;Balance training;Neuromuscular re-education;Patient/family education;Manual techniques;Vasopneumatic Device    PT Next Visit Plan  Continue work on ROM, could try desensitization       Patient will benefit from skilled therapeutic intervention in order to improve the following deficits and impairments:  Abnormal gait, Decreased range of motion, Difficulty walking, Pain, Decreased activity tolerance, Decreased balance, Impaired flexibility, Decreased strength, Decreased mobility, Increased edema  Visit Diagnosis: Stiffness of right ankle, not elsewhere classified  Pain in right ankle and joints of right foot  Localized edema     Problem List Patient Active Problem List   Diagnosis Date Noted  . Localized osteoarthritis of right ankle 10/02/2017  . Retained orthopedic hardware 10/02/2017  . Bimalleolar ankle fracture, right, closed, initial encounter 03/20/2017  . Bimalleolar fracture of right ankle 03/20/2017  . Depression with anxiety 12/25/2011  . HTN (hypertension) 12/25/2011  . GERD (gastroesophageal reflux disease) 12/25/2011  . COPD, moderate ([place]) 12/25/2011  . Chronic constipation 12/25/2011    Scot Jun, PTA 06/17/2018, 11:00 AM  [place], [place], [address] Phone: [phone]   Fax:  [phone]  Name: [name] MRN: [id number] Date of Birth: Jul 24, 1945

## 2018-06-19 ENCOUNTER — Ambulatory Visit: Payer: Medicare Other | Admitting: Physical Therapy

## 2018-06-19 ENCOUNTER — Encounter: Payer: Self-pay | Admitting: Physical Therapy

## 2018-06-19 DIAGNOSIS — M25671 Stiffness of right ankle, not elsewhere classified: Secondary | ICD-10-CM

## 2018-06-19 DIAGNOSIS — R600 Localized edema: Secondary | ICD-10-CM

## 2018-06-19 DIAGNOSIS — M25571 Pain in right ankle and joints of right foot: Secondary | ICD-10-CM

## 2018-06-19 NOTE — Therapy (Signed)
[place], [place], [address] Phone: [phone]   Fax:  [phone]  Physical Therapy Treatment  Patient Details  Name: [name] MRN: [id number] Date of Birth: 1946-06-14 Referring Provider (PT): Dr. Berenice Primas   Encounter Date: 06/19/2018  PT End of Session - 06/19/18 1427    Visit Number  4    Date for PT Re-Evaluation  08/10/18    PT Start Time  1345    PT Stop Time  1441    PT Time Calculation (min)  56 min    Activity Tolerance  Patient tolerated treatment well    Behavior During Therapy  [facility] for tasks assessed/performed       Past Medical History:  Diagnosis Date  . Allergy   . Ankle fracture 03/16/2017   right  . Bulging lumbar disc    L4 OR L5 TAKES STEROID INJECTIONS FOR TOOK INJECTION MARCH 2019  . Chronic bronchitis ([place]) FEW YEARS AGO  . Chronic hepatitis C ([place])    TOOK HARVONI TX 2017   . Colon polyp   . COPD (chronic obstructive pulmonary disease) ([place])   . Diverticulitis 43YRS AGO  . Dyspnea    WITH HEAVY EXERTION  . Emphysema of lung (HCC)    TOOK PULMONARY FUCTION TEST AND PASSED FEW WEEKS AGO AT FAMILY  MD  . Gallstones   . GERD (gastroesophageal reflux disease)   . History of kidney stones 20 YRS AGO   . Hypertension   . Osteoarthritis   . Pneumonia AT BIRTH  . Spondylosis of cervical region without myelopathy or radiculopathy     Past Surgical History:  Procedure Laterality Date  . ABDOMINAL HYSTERECTOMY  1980   PARTIAL  . ANKLE ARTHROSCOPY Right 10/02/2017   Procedure: RIGHT ANKLE ARTHROSCOPY WITH EXTENSIVE DEBRIDEMENT;  Surgeon: Dorna Leitz, MD;  Location: [place];  Service: Orthopedics;  Laterality: Right;  90 MINUTES FOR CASE  . BREAST BIOPSY  02/07/2010  . BREAST SURGERY  2011   biopsy  . CHOLECYSTECTOMY  1999   LAPAROSCOPIC  . HARDWARE REMOVAL Right 10/02/2017   Procedure: HARDWARE REMOVAL;  Surgeon: Dorna Leitz, MD;  Location:  [facility];  Service: Orthopedics;  Laterality: Right;  . ORIF ANKLE FRACTURE Right 03/20/2017   Procedure: OPEN REDUCTION INTERNAL FIXATION (ORIF) ANKLE FRACTURE;  Surgeon: Dorna Leitz, MD;  Location: [place];  Service: Orthopedics;  Laterality: Right;  . TONSILLECTOMY AND ADENOIDECTOMY  1950's    There were no vitals filed for this visit.  Subjective Assessment - 06/19/18 1351    Subjective  Pt reports some pain in her R calf    Currently in Pain?  Yes    Pain Location  Calf    Pain Orientation  Right                       [facility] Adult PT Treatment/Exercise - 06/19/18 0001      Ambulation/Gait   Gait Comments  2 flights of stairs some compensation descending due to decrease R ankle DF      Vasopneumatic   Number Minutes Vasopneumatic   15 minutes    Vasopnuematic Location   Ankle    Vasopneumatic Pressure  Medium    Vasopneumatic Temperature   34      Manual Therapy   Manual Therapy  Passive ROM    Manual therapy comments  some PROM taken to end  range and held    Passive ROM  R ankle all directions      Ankle Exercises: Aerobic   Elliptical  I10 R5 x 5 min    Recumbent Bike  Ll x 5 min       Ankle Exercises: Standing   Rocker Board  3 minutes    Heel Raises  Both;15 reps;2 seconds    Other Standing Ankle Exercises  Resisted gait 40lb x10      Ankle Exercises: Stretches   Gastroc Stretch  4 reps;20 seconds               PT Short Term Goals - 06/12/18 0841      PT SHORT TERM GOAL #1   Title  patient to be independent with initial HEP    Status  Achieved        PT Long Term Goals - 06/17/18 1059      PT LONG TERM GOAL #2   Title  patient to improve R ankle AROM to [facility] to allow for improved gait mechanics    Status  On-going            Plan - 06/19/18 1428    Clinical Impression Statement  Although pt does well functionally, decrease R ankle DF causes her to compensate with gait and stair  negotiation. Sensitive to lateral malleolus with touch. Tactile cues to help stretch R ankle.    Rehab Potential  Good    PT Frequency  2x / week    PT Duration  8 weeks    PT Treatment/Interventions  ADLs/Self Care Home Management;Cryotherapy;Electrical Stimulation;Iontophoresis 4mg /ml Dexamethasone;Gait training;Stair training;Functional mobility training;Therapeutic activities;Therapeutic exercise;Balance training;Neuromuscular re-education;Patient/family education;Manual techniques;Vasopneumatic Device    PT Next Visit Plan  Continue work on ROM, could try desensitization       Patient will benefit from skilled therapeutic intervention in order to improve the following deficits and impairments:  Abnormal gait, Decreased range of motion, Difficulty walking, Pain, Decreased activity tolerance, Decreased balance, Impaired flexibility, Decreased strength, Decreased mobility, Increased edema  Visit Diagnosis: Stiffness of right ankle, not elsewhere classified  Pain in right ankle and joints of right foot  Localized edema     Problem List Patient Active Problem List   Diagnosis Date Noted  . Localized osteoarthritis of right ankle 10/02/2017  . Retained orthopedic hardware 10/02/2017  . Bimalleolar ankle fracture, right, closed, initial encounter 03/20/2017  . Bimalleolar fracture of right ankle 03/20/2017  . Depression with anxiety 12/25/2011  . HTN (hypertension) 12/25/2011  . GERD (gastroesophageal reflux disease) 12/25/2011  . COPD, moderate ([place]) 12/25/2011  . Chronic constipation 12/25/2011    Scot Jun, PTA 06/19/2018, 2:32 PM  [place], [place], [address] Phone: [phone]   Fax:  [phone]  Name: Gabriellia Rempel MRN: [id number] Date of Birth: 1946/06/07

## 2018-06-23 ENCOUNTER — Encounter: Payer: Self-pay | Admitting: Physical Therapy

## 2018-06-23 ENCOUNTER — Ambulatory Visit: Payer: Medicare Other | Admitting: Physical Therapy

## 2018-06-23 DIAGNOSIS — M25671 Stiffness of right ankle, not elsewhere classified: Secondary | ICD-10-CM | POA: Diagnosis not present

## 2018-06-23 DIAGNOSIS — M25571 Pain in right ankle and joints of right foot: Secondary | ICD-10-CM

## 2018-06-23 DIAGNOSIS — R600 Localized edema: Secondary | ICD-10-CM

## 2018-06-23 DIAGNOSIS — R262 Difficulty in walking, not elsewhere classified: Secondary | ICD-10-CM

## 2018-06-23 NOTE — Therapy (Signed)
[place], [place], [address] Phone: [phone]   Fax:  [phone]  Physical Therapy Treatment  Patient Details  Name: [name] MRN: [id number] Date of Birth: [birth year] Referring Provider (PT): Dr. Berenice Primas   Encounter Date: 06/23/2018  PT End of Session - 06/23/18 1110    Visit Number  5    Date for PT Re-Evaluation  08/10/18    PT Start Time  1696    PT Stop Time  1112    PT Time Calculation (min)  57 min    Activity Tolerance  Patient tolerated treatment well    Behavior During Therapy  [facility] for tasks assessed/performed       Past Medical History:  Diagnosis Date  . Allergy   . Ankle fracture 03/16/2017   right  . Bulging lumbar disc    L4 OR L5 TAKES STEROID INJECTIONS FOR TOOK INJECTION MARCH 2019  . Chronic bronchitis ([place]) FEW YEARS AGO  . Chronic hepatitis C ([place])    TOOK HARVONI TX 2017   . Colon polyp   . COPD (chronic obstructive pulmonary disease) ([place])   . Diverticulitis 24YRS AGO  . Dyspnea    WITH HEAVY EXERTION  . Emphysema of lung (HCC)    TOOK PULMONARY FUCTION TEST AND PASSED FEW WEEKS AGO AT FAMILY  MD  . Gallstones   . GERD (gastroesophageal reflux disease)   . History of kidney stones 20 YRS AGO   . Hypertension   . Osteoarthritis   . Pneumonia AT BIRTH  . Spondylosis of cervical region without myelopathy or radiculopathy     Past Surgical History:  Procedure Laterality Date  . ABDOMINAL HYSTERECTOMY  1980   PARTIAL  . ANKLE ARTHROSCOPY Right 10/02/2017   Procedure: RIGHT ANKLE ARTHROSCOPY WITH EXTENSIVE DEBRIDEMENT;  Surgeon: Dorna Leitz, MD;  Location: [place];  Service: Orthopedics;  Laterality: Right;  90 MINUTES FOR CASE  . BREAST BIOPSY  02/07/2010  . BREAST SURGERY  2011   biopsy  . CHOLECYSTECTOMY  1999   LAPAROSCOPIC  . HARDWARE REMOVAL Right 10/02/2017   Procedure: HARDWARE REMOVAL;  Surgeon: Dorna Leitz, MD;  [facility];  Service: Orthopedics;  Laterality: Right;  . ORIF ANKLE FRACTURE Right 03/20/2017   Procedure: OPEN REDUCTION INTERNAL FIXATION (ORIF) ANKLE FRACTURE;  Surgeon: Dorna Leitz, MD;  Location: [place];  Service: Orthopedics;  Laterality: Right;  . TONSILLECTOMY AND ADENOIDECTOMY  1950's    There were no vitals filed for this visit.  Subjective Assessment - 06/23/18 1016    Subjective  "It is painful today, but I think the bunching up is going away"         [facility] PT Assessment - 06/23/18 0001      AROM   Right Ankle Dorsiflexion  2    Right Ankle Plantar Flexion  53                   [facility] Adult PT Treatment/Exercise - 06/23/18 0001      Ambulation/Gait   Gait Comments  3 flights of stairs some compensation descending due to decrease R ankle DF      Vasopneumatic   Number Minutes Vasopneumatic   15 minutes    Vasopnuematic Location   Ankle    Vasopneumatic Pressure  Medium    Vasopneumatic Temperature   34      Manual Therapy   Manual Therapy  Passive ROM    Manual therapy comments  some PROM taken to end range and held    Passive ROM  R ankle all directions      Ankle Exercises: Aerobic   Elliptical  I10 R5 x 5 min    Recumbent Bike  Ll x 5 min       Ankle Exercises: Machines for Strengthening   Cybex Leg Press  20lb 2x10      Ankle Exercises: Stretches   Gastroc Stretch  4 reps;20 seconds               PT Short Term Goals - 06/12/18 0841      PT SHORT TERM GOAL #1   Title  patient to be independent with initial HEP    Status  Achieved        PT Long Term Goals - 06/17/18 1059      PT LONG TERM GOAL #2   Title  patient to improve R ankle AROM to [facility] to allow for improved gait mechanics    Status  On-going            Plan - 06/23/18 1110    Clinical Impression Statement  Pt with a minor increase in R ankle dorsiflexion, but the motion remains very limited causing her to compensate  with functional activities. R ankle DF has a firm end feel noted with PROM. Lateral malleolus remains very sensitive to touch. cues to promote heel strike with resisted gait.     Rehab Potential  Good    PT Frequency  2x / week    PT Duration  8 weeks    PT Treatment/Interventions  ADLs/Self Care Home Management;Cryotherapy;Electrical Stimulation;Iontophoresis 4mg /ml Dexamethasone;Gait training;Stair training;Functional mobility training;Therapeutic activities;Therapeutic exercise;Balance training;Neuromuscular re-education;Patient/family education;Manual techniques;Vasopneumatic Device    PT Next Visit Plan  Continue work on ROM, could try desensitization       Patient will benefit from skilled therapeutic intervention in order to improve the following deficits and impairments:  Abnormal gait, Decreased range of motion, Difficulty walking, Pain, Decreased activity tolerance, Decreased balance, Impaired flexibility, Decreased strength, Decreased mobility, Increased edema  Visit Diagnosis: Stiffness of right ankle, not elsewhere classified  Pain in right ankle and joints of right foot  Localized edema  Difficulty in walking, not elsewhere classified     Problem List Patient Active Problem List   Diagnosis Date Noted  . Localized osteoarthritis of right ankle 10/02/2017  . Retained orthopedic hardware 10/02/2017  . Bimalleolar ankle fracture, right, closed, initial encounter 03/20/2017  . Bimalleolar fracture of right ankle 03/20/2017  . Depression with anxiety 12/25/2011  . HTN (hypertension) 12/25/2011  . GERD (gastroesophageal reflux disease) 12/25/2011  . COPD, moderate ([place]) 12/25/2011  . Chronic constipation 12/25/2011    Scot Jun, PTA 06/23/2018, 11:12 AM  [place], [place], [address] Phone: [phone]   Fax:  [phone]  Name: Alita Waldren MRN: [id number] Date of  Birth: 1945-12-27

## 2018-06-26 ENCOUNTER — Encounter: Payer: Self-pay | Admitting: Physical Therapy

## 2018-06-26 ENCOUNTER — Ambulatory Visit: Payer: Medicare Other | Admitting: Physical Therapy

## 2018-06-26 DIAGNOSIS — M25671 Stiffness of right ankle, not elsewhere classified: Secondary | ICD-10-CM | POA: Diagnosis not present

## 2018-06-26 DIAGNOSIS — M25571 Pain in right ankle and joints of right foot: Secondary | ICD-10-CM

## 2018-06-26 DIAGNOSIS — R600 Localized edema: Secondary | ICD-10-CM

## 2018-06-26 DIAGNOSIS — R262 Difficulty in walking, not elsewhere classified: Secondary | ICD-10-CM

## 2018-06-26 NOTE — Therapy (Signed)
[place], [place], [address] Phone: [phone]   Fax:  [phone]  Physical Therapy Treatment  Patient Details  Name: [name] MRN: [id number] Date of Birth: 04-09-1946 Referring Provider (PT): Dr. Berenice Primas   Encounter Date: 06/26/2018  PT End of Session - 06/26/18 1512    Visit Number  6    Date for PT Re-Evaluation  08/10/18    PT Start Time  1430    PT Stop Time  1513    PT Time Calculation (min)  43 min    Activity Tolerance  Patient tolerated treatment well    Behavior During Therapy  [facility] for tasks assessed/performed       Past Medical History:  Diagnosis Date  . Allergy   . Ankle fracture 03/16/2017   right  . Bulging lumbar disc    L4 OR L5 TAKES STEROID INJECTIONS FOR TOOK INJECTION MARCH 2019  . Chronic bronchitis ([place]) FEW YEARS AGO  . Chronic hepatitis C ([place])    TOOK HARVONI TX 2017   . Colon polyp   . COPD (chronic obstructive pulmonary disease) ([place])   . Diverticulitis 53YRS AGO  . Dyspnea    WITH HEAVY EXERTION  . Emphysema of lung (HCC)    TOOK PULMONARY FUCTION TEST AND PASSED FEW WEEKS AGO AT FAMILY  MD  . Gallstones   . GERD (gastroesophageal reflux disease)   . History of kidney stones 20 YRS AGO   . Hypertension   . Osteoarthritis   . Pneumonia AT BIRTH  . Spondylosis of cervical region without myelopathy or radiculopathy     Past Surgical History:  Procedure Laterality Date  . ABDOMINAL HYSTERECTOMY  1980   PARTIAL  . ANKLE ARTHROSCOPY Right 10/02/2017   Procedure: RIGHT ANKLE ARTHROSCOPY WITH EXTENSIVE DEBRIDEMENT;  Surgeon: Dorna Leitz, MD;  Location: [place];  Service: Orthopedics;  Laterality: Right;  90 MINUTES FOR CASE  . BREAST BIOPSY  02/07/2010  . BREAST SURGERY  2011   biopsy  . CHOLECYSTECTOMY  1999   LAPAROSCOPIC  . HARDWARE REMOVAL Right 10/02/2017   Procedure: HARDWARE REMOVAL;  Surgeon: Dorna Leitz, MD;  Location:  [facility];  Service: Orthopedics;  Laterality: Right;  . ORIF ANKLE FRACTURE Right 03/20/2017   Procedure: OPEN REDUCTION INTERNAL FIXATION (ORIF) ANKLE FRACTURE;  Surgeon: Dorna Leitz, MD;  Location: [place];  Service: Orthopedics;  Laterality: Right;  . TONSILLECTOMY AND ADENOIDECTOMY  1950's    There were no vitals filed for this visit.  Subjective Assessment - 06/26/18 1429    Subjective  "Good"    Pain Score  6     Pain Location  Ankle    Pain Orientation  Right                       [facility] Adult PT Treatment/Exercise - 06/26/18 0001      Ambulation/Gait   Gait Comments  1 flight thne back down with and withpout rail, alternaitng pattern, back down outside up hill       Modalities   Modalities  Ultrasound      Ultrasound   Ultrasound Location  Lat R ankle    Ultrasound Parameters  3/3Mhz 1.2w/cm2    Ultrasound Goals  Pain   deep heating     Ankle Exercises: Aerobic   Recumbent Bike  L2 x 4 min       Ankle  Exercises: Stretches   Gastroc Stretch  4 reps;20 seconds      Ankle Exercises: Standing   Rocker Board  3 minutes    Heel Raises  Both;15 reps;2 seconds               PT Short Term Goals - 06/12/18 0841      PT SHORT TERM GOAL #1   Title  patient to be independent with initial HEP    Status  Achieved        PT Long Term Goals - 06/17/18 1059      PT LONG TERM GOAL #2   Title  patient to improve R ankle AROM to [place] [facility] to allow for improved gait mechanics    Status  On-going            Plan - 06/26/18 1512    Clinical Impression Statement  Pt reports that she wanted to focus on stretching today. She continues to compensate with gastroc stretch, pushing her hip back. Given visual and tactile cues to help correct. Compensation remain when descending stairs. R heel pops up quick due to decrease R ankle dorsi flexion. Did well with up hill ambulation.     Rehab Potential  Good    PT Frequency  2x  / week    PT Duration  8 weeks    PT Treatment/Interventions  ADLs/Self Care Home Management;Cryotherapy;Electrical Stimulation;Iontophoresis 4mg /ml Dexamethasone;Gait training;Stair training;Functional mobility training;Therapeutic activities;Therapeutic exercise;Balance training;Neuromuscular re-education;Patient/family education;Manual techniques;Vasopneumatic Device    PT Next Visit Plan  Continue work on ROM, could try desensitization       Patient will benefit from skilled therapeutic intervention in order to improve the following deficits and impairments:  Abnormal gait, Decreased range of motion, Difficulty walking, Pain, Decreased activity tolerance, Decreased balance, Impaired flexibility, Decreased strength, Decreased mobility, Increased edema  Visit Diagnosis: Stiffness of right ankle, not elsewhere classified  Pain in right ankle and joints of right foot  Localized edema  Difficulty in walking, not elsewhere classified     Problem List Patient Active Problem List   Diagnosis Date Noted  . Localized osteoarthritis of right ankle 10/02/2017  . Retained orthopedic hardware 10/02/2017  . Bimalleolar ankle fracture, right, closed, initial encounter 03/20/2017  . Bimalleolar fracture of right ankle 03/20/2017  . Depression with anxiety 12/25/2011  . HTN (hypertension) 12/25/2011  . GERD (gastroesophageal reflux disease) 12/25/2011  . COPD, moderate ([place]) 12/25/2011  . Chronic constipation 12/25/2011    Scot Jun, PTA 06/26/2018, 3:15 PM  [place], [place], [address] Phone: [phone]   Fax:  [phone]  Name: [name] MRN: [id number] Date of Birth: Dec 31, 1945

## 2018-06-27 ENCOUNTER — Ambulatory Visit: Payer: Medicare Other | Admitting: Physical Therapy

## 2018-07-01 ENCOUNTER — Encounter: Payer: Self-pay | Admitting: Physical Therapy

## 2018-07-01 ENCOUNTER — Encounter: Payer: Medicare Other | Admitting: Physical Therapy

## 2018-07-01 ENCOUNTER — Ambulatory Visit: Payer: Medicare Other | Admitting: Physical Therapy

## 2018-07-01 DIAGNOSIS — M25671 Stiffness of right ankle, not elsewhere classified: Secondary | ICD-10-CM

## 2018-07-01 DIAGNOSIS — M25571 Pain in right ankle and joints of right foot: Secondary | ICD-10-CM

## 2018-07-01 DIAGNOSIS — R600 Localized edema: Secondary | ICD-10-CM

## 2018-07-01 NOTE — Therapy (Signed)
[place], [place], [address] Phone: [phone]   Fax:  [phone]  Physical Therapy Treatment  Patient Details  Name: [name] MRN: [id number] Date of Birth: 04-Dec-1945 Referring Provider (PT): Dr. Berenice Primas   Encounter Date: 07/01/2018  PT End of Session - 07/01/18 1141    Visit Number  7    Date for PT Re-Evaluation  08/10/18    PT Start Time  1100    PT Stop Time  1142    PT Time Calculation (min)  42 min    Activity Tolerance  Patient tolerated treatment well    Behavior During Therapy  [facility] for tasks assessed/performed       Past Medical History:  Diagnosis Date  . Allergy   . Ankle fracture 03/16/2017   right  . Bulging lumbar disc    L4 OR L5 TAKES STEROID INJECTIONS FOR TOOK INJECTION MARCH 2019  . Chronic bronchitis ([place]) FEW YEARS AGO  . Chronic hepatitis C ([place])    TOOK HARVON[place] 2017   . Colon polyp   . COPD (chronic obstructive pulmonary disease) ([place])   . Diverticulitis 71YRS AGO  . Dyspnea    WITH HEAVY EXERTION  . Emphysema of lung (HCC)    TOOK PULMONARY FUCTION TEST AND PASSED FEW WEEKS AGO AT FAMILY  MD  . Gallstones   . GERD (gastroesophageal reflux disease)   . History of kidney stones 20 YRS AGO   . Hypertension   . Osteoarthritis   . Pneumonia AT BIRTH  . Spondylosis of cervical region without myelopathy or radiculopathy     Past Surgical History:  Procedure Laterality Date  . ABDOMINAL HYSTERECTOMY  1980   PARTIAL  . ANKLE ARTHROSCOPY Right 10/02/2017   Procedure: RIGHT ANKLE ARTHROSCOPY WITH EXTENSIVE DEBRIDEMENT;  Surgeon: Dorna Leitz, MD;  Location: [place];  Service: Orthopedics;  Laterality: Right;  90 MINUTES FOR CASE  . BREAST BIOPSY  02/07/2010  . BREAST SURGERY  2011   biopsy  . CHOLECYSTECTOMY  1999   LAPAROSCOPIC  . HARDWARE REMOVAL Right 10/02/2017   Procedure: HARDWARE REMOVAL;  Surgeon: Dorna Leitz, MD;  Location:  [facility];  Service: Orthopedics;  Laterality: Right;  . ORIF ANKLE FRACTURE Right 03/20/2017   Procedure: OPEN REDUCTION INTERNAL FIXATION (ORIF) ANKLE FRACTURE;  Surgeon: Dorna Leitz, MD;  Location: [place];  Service: Orthopedics;  Laterality: Right;  . TONSILLECTOMY AND ADENOIDECTOMY  1950's    There were no vitals filed for this visit.  Subjective Assessment - 07/01/18 1101    Subjective  Pt reports that her ankle was doing fine up until yesterday when she felt it "Knotting up"    Currently in Pain?  Yes    Pain Score  5     Pain Location  Knee    Pain Orientation  Right                       [facility] Adult PT Treatment/Exercise - 07/01/18 0001      Ambulation/Gait   Gait Comments  2 flight thne back down with and withpout rail, alternaitng pattern, back down outside up hill       Ultrasound   Ultrasound Location  Lateral R ankle     Ultrasound Parameters  3.3Mz 1.2w/cm2     Ultrasound Goals  Pain   deep heating      Ankle Exercises: Aerobic  Elliptical  I12 R6 x 6 min      Ankle Exercises: Stretches   Gastroc Stretch  4 reps;20 seconds      Ankle Exercises: Standing   Rocker Board  3 minutes    Heel Raises  Both;15 reps;2 seconds    Other Standing Ankle Exercises  Toe and heel walking                PT Short Term Goals - 06/12/18 0841      PT SHORT TERM GOAL #1   Title  patient to be independent with initial HEP    Status  Achieved        PT Long Term Goals - 06/17/18 1059      PT LONG TERM GOAL #2   Title  patient to improve R ankle AROM to [facility] to allow for improved gait mechanics    Status  On-going            Plan - 07/01/18 1142    Clinical Impression Statement  Pt enters clinic with improved gait pattern. She has also improved on stair negotiation despite limited R ankle dorsiflexion. Good heel elevation with toe walking. Limited elevation with heel walking. Positive response to US>      PT Frequency  2x / week    PT Duration  8 weeks    PT Treatment/Interventions  ADLs/Self Care Home Management;Cryotherapy;Electrical Stimulation;Iontophoresis 4mg /ml Dexamethasone;Gait training;Stair training;Functional mobility training;Therapeutic activities;Therapeutic exercise;Balance training;Neuromuscular re-education;Patient/family education;Manual techniques;Vasopneumatic Device    PT Next Visit Plan  Continue work on ROM, could try desensitization       Patient will benefit from skilled therapeutic intervention in order to improve the following deficits and impairments:  Abnormal gait, Decreased range of motion, Difficulty walking, Pain, Decreased activity tolerance, Decreased balance, Impaired flexibility, Decreased strength, Decreased mobility, Increased edema  Visit Diagnosis: Stiffness of right ankle, not elsewhere classified  Pain in right ankle and joints of right foot  Localized edema     Problem List Patient Active Problem List   Diagnosis Date Noted  . Localized osteoarthritis of right ankle 10/02/2017  . Retained orthopedic hardware 10/02/2017  . Bimalleolar ankle fracture, right, closed, initial encounter 03/20/2017  . Bimalleolar fracture of right ankle 03/20/2017  . Depression with anxiety 12/25/2011  . HTN (hypertension) 12/25/2011  . GERD (gastroesophageal reflux disease) 12/25/2011  . COPD, moderate ([place]) 12/25/2011  . Chronic constipation 12/25/2011    Scot Jun, PTA 07/01/2018, 11:46 AM  [address] [place], [place], [address] Phone: [phone]   Fax:  [phone]  Name: [name] MRN: [id number] Date of Birth: 03/27/1946

## 2018-07-03 ENCOUNTER — Ambulatory Visit: Payer: Medicare Other | Attending: Orthopedic Surgery | Admitting: Physical Therapy

## 2018-07-03 ENCOUNTER — Encounter: Payer: Medicare Other | Admitting: Physical Therapy

## 2018-07-03 DIAGNOSIS — M19071 Primary osteoarthritis, right ankle and foot: Secondary | ICD-10-CM | POA: Insufficient documentation

## 2018-07-03 DIAGNOSIS — R262 Difficulty in walking, not elsewhere classified: Secondary | ICD-10-CM | POA: Insufficient documentation

## 2018-07-03 DIAGNOSIS — R2689 Other abnormalities of gait and mobility: Secondary | ICD-10-CM | POA: Insufficient documentation

## 2018-07-03 DIAGNOSIS — M25571 Pain in right ankle and joints of right foot: Secondary | ICD-10-CM | POA: Insufficient documentation

## 2018-07-03 DIAGNOSIS — M25671 Stiffness of right ankle, not elsewhere classified: Secondary | ICD-10-CM | POA: Insufficient documentation

## 2018-07-03 DIAGNOSIS — R600 Localized edema: Secondary | ICD-10-CM | POA: Insufficient documentation

## 2018-07-07 ENCOUNTER — Other Ambulatory Visit: Payer: Self-pay | Admitting: Family Medicine

## 2018-07-07 DIAGNOSIS — Z1231 Encounter for screening mammogram for malignant neoplasm of breast: Secondary | ICD-10-CM

## 2018-07-08 ENCOUNTER — Encounter: Payer: Self-pay | Admitting: Physical Therapy

## 2018-07-08 ENCOUNTER — Ambulatory Visit: Payer: Medicare Other | Admitting: Physical Therapy

## 2018-07-08 DIAGNOSIS — R262 Difficulty in walking, not elsewhere classified: Secondary | ICD-10-CM | POA: Diagnosis present

## 2018-07-08 DIAGNOSIS — R600 Localized edema: Secondary | ICD-10-CM | POA: Diagnosis present

## 2018-07-08 DIAGNOSIS — M25671 Stiffness of right ankle, not elsewhere classified: Secondary | ICD-10-CM

## 2018-07-08 DIAGNOSIS — R2689 Other abnormalities of gait and mobility: Secondary | ICD-10-CM | POA: Diagnosis present

## 2018-07-08 DIAGNOSIS — M25571 Pain in right ankle and joints of right foot: Secondary | ICD-10-CM | POA: Diagnosis present

## 2018-07-08 DIAGNOSIS — M19071 Primary osteoarthritis, right ankle and foot: Secondary | ICD-10-CM

## 2018-07-08 NOTE — Therapy (Signed)
[place], [place], [address] Phone: [phone]   Fax:  [phone]  Physical Therapy Treatment  Patient Details  Name: [name] MRN: [id number] Date of Birth: [birth year] Referring Provider (PT): Dr. Berenice Primas   Encounter Date: 07/08/2018  PT End of Session - 07/08/18 1319    Visit Number  8    Date for PT Re-Evaluation  08/10/18    PT Start Time  1258    PT Stop Time  1334    PT Time Calculation (min)  36 min    Activity Tolerance  Patient tolerated treatment well    Behavior During Therapy  [facility] for tasks assessed/performed       Past Medical History:  Diagnosis Date  . Allergy   . Ankle fracture 03/16/2017   right  . Bulging lumbar disc    L4 OR L5 TAKES STEROID INJECTIONS FOR TOOK INJECTION MARCH 2019  . Chronic bronchitis ([place]) FEW YEARS AGO  . Chronic hepatitis C ([place])    TOOK HARVONI TX 2017   . Colon polyp   . COPD (chronic obstructive pulmonary disease) ([place])   . Diverticulitis 14YRS AGO  . Dyspnea    WITH HEAVY EXERTION  . Emphysema of lung (HCC)    TOOK PULMONARY FUCTION TEST AND PASSED FEW WEEKS AGO AT FAMILY  MD  . Gallstones   . GERD (gastroesophageal reflux disease)   . History of kidney stones 20 YRS AGO   . Hypertension   . Osteoarthritis   . Pneumonia AT BIRTH  . Spondylosis of cervical region without myelopathy or radiculopathy     Past Surgical History:  Procedure Laterality Date  . ABDOMINAL HYSTERECTOMY  1980   PARTIAL  . ANKLE ARTHROSCOPY Right 10/02/2017   Procedure: RIGHT ANKLE ARTHROSCOPY WITH EXTENSIVE DEBRIDEMENT;  Surgeon: Dorna Leitz, MD;  Location: [place];  Service: Orthopedics;  Laterality: Right;  90 MINUTES FOR CASE  . BREAST BIOPSY  02/07/2010  . BREAST SURGERY  2011   biopsy  . CHOLECYSTECTOMY  1999   LAPAROSCOPIC  . HARDWARE REMOVAL Right 10/02/2017   Procedure: HARDWARE REMOVAL;  Surgeon: Dorna Leitz, MD;  Location:  [facility];  Service: Orthopedics;  Laterality: Right;  . ORIF ANKLE FRACTURE Right 03/20/2017   Procedure: OPEN REDUCTION INTERNAL FIXATION (ORIF) ANKLE FRACTURE;  Surgeon: Dorna Leitz, MD;  Location: [place];  Service: Orthopedics;  Laterality: Right;  . TONSILLECTOMY AND ADENOIDECTOMY  1950's    There were no vitals filed for this visit.  Subjective Assessment - 07/08/18 1257    Subjective  Pt reports that MD suggested a ankle fusion or replacement. She said she did not want to have another surgery. Reports getting a Cortizone shot in R ankle     Currently in Pain?  Yes    Pain Score  7     Pain Location  Ankle    Pain Orientation  Right                       [facility] Adult PT Treatment/Exercise - 07/08/18 0001      Vasopneumatic   Number Minutes Vasopneumatic   15 minutes    Vasopnuematic Location   Ankle    Vasopneumatic Pressure  Medium    Vasopneumatic Temperature   34      Manual Therapy   Manual Therapy  Passive ROM    Manual therapy comments  some PROM taken to end range and held    Passive ROM  R ankle all directions               PT Short Term Goals - 06/12/18 0841      PT SHORT TERM GOAL #1   Title  patient to be independent with initial HEP    Status  Achieved        PT Long Term Goals - 06/17/18 1059      PT LONG TERM GOAL #2   Title  patient to improve R ankle AROM to [facility] to allow for improved gait mechanics    Status  On-going            Plan - 07/08/18 1322    Clinical Impression Statement  Pt reports  getting a Cortizone shot in her R ankle earlier today and is having some pain. Only light aerobic warm up and passive stretching due to recent injection.     Rehab Potential  Good    PT Frequency  2x / week    PT Duration  8 weeks    PT Treatment/Interventions  ADLs/Self Care Home Management;Cryotherapy;Electrical Stimulation;Iontophoresis 4mg /ml Dexamethasone;Gait training;Stair  training;Functional mobility training;Therapeutic activities;Therapeutic exercise;Balance training;Neuromuscular re-education;Patient/family education;Manual techniques;Vasopneumatic Device    PT Next Visit Plan  Continue work on ROM, could try desensitization       Patient will benefit from skilled therapeutic intervention in order to improve the following deficits and impairments:  Abnormal gait, Decreased range of motion, Difficulty walking, Pain, Decreased activity tolerance, Decreased balance, Impaired flexibility, Decreased strength, Decreased mobility, Increased edema  Visit Diagnosis: Stiffness of right ankle, not elsewhere classified  Pain in right ankle and joints of right foot  Localized edema  Localized osteoarthritis of right ankle  Difficulty in walking, not elsewhere classified     Problem List Patient Active Problem List   Diagnosis Date Noted  . Localized osteoarthritis of right ankle 10/02/2017  . Retained orthopedic hardware 10/02/2017  . Bimalleolar ankle fracture, right, closed, initial encounter 03/20/2017  . Bimalleolar fracture of right ankle 03/20/2017  . Depression with anxiety 12/25/2011  . HTN (hypertension) 12/25/2011  . GERD (gastroesophageal reflux disease) 12/25/2011  . COPD, moderate ([place]) 12/25/2011  . Chronic constipation 12/25/2011    Scot Jun, PTA 07/08/2018, 1:27 PM  [place], [place], [address] Phone: [phone]   Fax:  [phone]  Name: Jerilee Space MRN: [id number] Date of Birth: Nov 08, 1945

## 2018-07-10 ENCOUNTER — Ambulatory Visit: Payer: Medicare Other | Admitting: Physical Therapy

## 2018-07-10 ENCOUNTER — Encounter: Payer: Self-pay | Admitting: Physical Therapy

## 2018-07-10 DIAGNOSIS — R600 Localized edema: Secondary | ICD-10-CM

## 2018-07-10 DIAGNOSIS — M25671 Stiffness of right ankle, not elsewhere classified: Secondary | ICD-10-CM

## 2018-07-10 DIAGNOSIS — M25571 Pain in right ankle and joints of right foot: Secondary | ICD-10-CM

## 2018-07-10 NOTE — Therapy (Signed)
[name], [place], [address] Phone: [phone]   Fax:  [phone]  Physical Therapy Treatment  Patient Details  Name: [name] MRN: [id number] Date of Birth: 1945/09/30 Referring Provider (PT): Dr. Berenice Primas   Encounter Date: 07/10/2018  PT End of Session - 07/10/18 1056    Visit Number  9    Date for PT Re-Evaluation  08/10/18    PT Start Time  1016    PT Stop Time  1106    PT Time Calculation (min)  50 min    Activity Tolerance  Patient tolerated treatment well    Behavior During Therapy  [facility] for tasks assessed/performed       Past Medical History:  Diagnosis Date  . Allergy   . Ankle fracture 03/16/2017   right  . Bulging lumbar disc    L4 OR L5 TAKES STEROID INJECTIONS FOR TOOK INJECTION MARCH 2019  . Chronic bronchitis ([place]) FEW YEARS AGO  . Chronic hepatitis C ([place])    TOOK HARVONI TX 2017   . Colon polyp   . COPD (chronic obstructive pulmonary disease) ([place])   . Diverticulitis 63YRS AGO  . Dyspnea    WITH HEAVY EXERTION  . Emphysema of lung (HCC)    TOOK PULMONARY FUCTION TEST AND PASSED FEW WEEKS AGO AT FAMILY  MD  . Gallstones   . GERD (gastroesophageal reflux disease)   . History of kidney stones 20 YRS AGO   . Hypertension   . Osteoarthritis   . Pneumonia AT BIRTH  . Spondylosis of cervical region without myelopathy or radiculopathy     Past Surgical History:  Procedure Laterality Date  . ABDOMINAL HYSTERECTOMY  1980   PARTIAL  . ANKLE ARTHROSCOPY Right 10/02/2017   Procedure: RIGHT ANKLE ARTHROSCOPY WITH EXTENSIVE DEBRIDEMENT;  Surgeon: Dorna Leitz, MD;  Location: [place];  Service: Orthopedics;  Laterality: Right;  90 MINUTES FOR CASE  . BREAST BIOPSY  02/07/2010  . BREAST SURGERY  2011   biopsy  . CHOLECYSTECTOMY  1999   LAPAROSCOPIC  . HARDWARE REMOVAL Right 10/02/2017   Procedure: HARDWARE REMOVAL;  Surgeon: Dorna Leitz, MD;  Location:  [facility];  Service: Orthopedics;  Laterality: Right;  . ORIF ANKLE FRACTURE Right 03/20/2017   Procedure: OPEN REDUCTION INTERNAL FIXATION (ORIF) ANKLE FRACTURE;  Surgeon: Dorna Leitz, MD;  Location: [place];  Service: Orthopedics;  Laterality: Right;  . TONSILLECTOMY AND ADENOIDECTOMY  1950's    There were no vitals filed for this visit.  Subjective Assessment - 07/10/18 1020    Subjective  Everything is good except my tooth    Currently in Pain?  Yes    Pain Score  9     Pain Location  Teeth    Pain Orientation  Right;Posterior                       [facility] Adult PT Treatment/Exercise - 07/10/18 0001      Ambulation/Gait   Gait Comments  2 flight thne back down with and withpout rail, alternaitng pattern      Modalities   Modalities  Cryotherapy      Cryotherapy   Number Minutes Cryotherapy  10 Minutes    Cryotherapy Location  Ankle    Type of Cryotherapy  Ice pack      Manual Therapy   Manual Therapy  Passive ROM    Manual therapy  comments  some PROM taken to end range and held    Passive ROM  R ankle all directions      Ankle Exercises: Aerobic   Elliptical  I12 R6 x 4 min    Recumbent Bike  L2 x 4 min       Ankle Exercises: Machines for Strengthening   Cybex Leg Press  40lb 2x15, Heel raises 40lb 2x20      Ankle Exercises: Stretches   Gastroc Stretch  4 reps;20 seconds      Ankle Exercises: Seated   Other Seated Ankle Exercises  Low squats 2x10       Ankle Exercises: Standing   Other Standing Ankle Exercises  Toe and heel walking                PT Short Term Goals - 06/12/18 0841      PT SHORT TERM GOAL #1   Title  patient to be independent with initial HEP    Status  Achieved        PT Long Term Goals - 06/17/18 1059      PT LONG TERM GOAL #2   Title  patient to improve R ankle AROM to [facility] to allow for improved gait mechanics    Status  On-going            Plan - 07/10/18 1058     Clinical Impression Statement  Compensation remains due to decrease ankle DF, but no limitations. Stair negotiation has improved but small limp remains. Little elevation with to and heel raises.     Rehab Potential  Good    PT Duration  8 weeks    PT Next Visit Plan  Continue work on ROM       Patient will benefit from skilled therapeutic intervention in order to improve the following deficits and impairments:  Abnormal gait, Decreased range of motion, Difficulty walking, Pain, Decreased activity tolerance, Decreased balance, Impaired flexibility, Decreased strength, Decreased mobility, Increased edema  Visit Diagnosis: Stiffness of right ankle, not elsewhere classified  Pain in right ankle and joints of right foot  Localized edema     Problem List Patient Active Problem List   Diagnosis Date Noted  . Localized osteoarthritis of right ankle 10/02/2017  . Retained orthopedic hardware 10/02/2017  . Bimalleolar ankle fracture, right, closed, initial encounter 03/20/2017  . Bimalleolar fracture of right ankle 03/20/2017  . Depression with anxiety 12/25/2011  . HTN (hypertension) 12/25/2011  . GERD (gastroesophageal reflux disease) 12/25/2011  . COPD, moderate ([place]) 12/25/2011  . Chronic constipation 12/25/2011    Scot Jun, PTA 07/10/2018, 11:03 AM  [address] [place], [place], [address] Phone: [phone]   Fax:  [phone]  Name: [name] MRN: [id number] Date of Birth: Nov 21, 1945

## 2018-07-15 ENCOUNTER — Encounter: Payer: Self-pay | Admitting: Physical Therapy

## 2018-07-15 ENCOUNTER — Ambulatory Visit: Payer: Medicare Other | Admitting: Physical Therapy

## 2018-07-15 DIAGNOSIS — R600 Localized edema: Secondary | ICD-10-CM

## 2018-07-15 DIAGNOSIS — M25571 Pain in right ankle and joints of right foot: Secondary | ICD-10-CM

## 2018-07-15 DIAGNOSIS — M25671 Stiffness of right ankle, not elsewhere classified: Secondary | ICD-10-CM

## 2018-07-15 NOTE — Therapy (Signed)
[address] [place], [place], [address] Phone: [phone]   Fax:  [phone] Progress Note Reporting Period 06/09/18 to 07/15/18 for the first 10 visits  See note below for Objective Data and Assessment of Progress/Goals.      Physical Therapy Treatment  Patient Details  Name: [name] MRN: [id number] Date of Birth: [birth year] Referring Provider (PT): Dr. Berenice Primas   Encounter Date: 07/15/2018  PT End of Session - 07/15/18 1148    Visit Number  10    Date for PT Re-Evaluation  08/10/18    PT Start Time  1101    PT Stop Time  1145    PT Time Calculation (min)  44 min    Activity Tolerance  Patient tolerated treatment well    Behavior During Therapy  [facility] for tasks assessed/performed       Past Medical History:  Diagnosis Date  . Allergy   . Ankle fracture 03/16/2017   right  . Bulging lumbar disc    L4 OR L5 TAKES STEROID INJECTIONS FOR TOOK INJECTION MARCH 2019  . Chronic bronchitis ([place]) FEW YEARS AGO  . Chronic hepatitis C ([place])    TOOK HARVONI [place] 2017   . Colon polyp   . COPD (chronic obstructive pulmonary disease) ([place])   . Diverticulitis 50YRS AGO  . Dyspnea    WITH HEAVY EXERTION  . Emphysema of lung (HCC)    TOOK PULMONARY FUCTION TEST AND PASSED FEW WEEKS AGO AT FAMILY  MD  . Gallstones   . GERD (gastroesophageal reflux disease)   . History of kidney stones 20 YRS AGO   . Hypertension   . Osteoarthritis   . Pneumonia AT BIRTH  . Spondylosis of cervical region without myelopathy or radiculopathy     Past Surgical History:  Procedure Laterality Date  . ABDOMINAL HYSTERECTOMY  1980   PARTIAL  . ANKLE ARTHROSCOPY Right 10/02/2017   Procedure: RIGHT ANKLE ARTHROSCOPY WITH EXTENSIVE DEBRIDEMENT;  Surgeon: Dorna Leitz, MD;  Location: [place];  Service: Orthopedics;  Laterality: Right;  90 MINUTES FOR CASE  . BREAST BIOPSY  02/07/2010  . BREAST SURGERY  2011    biopsy  . CHOLECYSTECTOMY  1999   LAPAROSCOPIC  . HARDWARE REMOVAL Right 10/02/2017   Procedure: HARDWARE REMOVAL;  Surgeon: Dorna Leitz, MD;  Location: [facility];  Service: Orthopedics;  Laterality: Right;  . ORIF ANKLE FRACTURE Right 03/20/2017   Procedure: OPEN REDUCTION INTERNAL FIXATION (ORIF) ANKLE FRACTURE;  Surgeon: Dorna Leitz, MD;  Location: [place];  Service: Orthopedics;  Laterality: Right;  . TONSILLECTOMY AND ADENOIDECTOMY  1950's    There were no vitals filed for this visit.  Subjective Assessment - 07/15/18 1109    Subjective  IT feels swollen    Currently in Pain?  No/denies                       [facility] Adult PT Treatment/Exercise - 07/15/18 0001      Ambulation/Gait   Gait Comments  3 flight thne back down with and withpout rail, alternaitng pattern      Ultrasound   Ultrasound Location  R ant/lat ankle    Ultrasound Parameters  3.3Mhz 1.1w/cm2    Ultrasound Goals  Pain      Ankle Exercises: Aerobic   Elliptical  I12 R6 x 4 min    Recumbent Bike  L2 x 4 min  Ankle Exercises: Seated   Other Seated Ankle Exercises  Low squats 2x10       Ankle Exercises: Standing   Rocker Board  3 minutes    Other Standing Ankle Exercises  Toe and heel walking                PT Short Term Goals - 06/12/18 0841      PT SHORT TERM GOAL #1   Title  patient to be independent with initial HEP    Status  Achieved        PT Long Term Goals - 07/15/18 1149      PT LONG TERM GOAL #1   Title  patient to be independent with advanced HEP    Status  Achieved      PT LONG TERM GOAL #2   Title  patient to improve R ankle AROM to [facility] to allow for improved gait mechanics    Status  On-going      PT LONG TERM GOAL #3   Title  patient to demonstrate good heel strike and appropriate gait mechanics with good stability and minimal deviation on level and unlevel surfaces    Status  On-going      PT LONG TERM GOAL #4    Title  patient to improve R ankle strength to >/= 4/5    Status  On-going      PT LONG TERM GOAL #5   Title  stand on the right leg >10 seconds without holding on    Status  On-going            Plan - 07/15/18 1150    Clinical Impression Statement  Overall pt is progressing towards goals, R ankle DF limitations causes her to compensate with gait and stair negotiation. Cues to keep heel up with toe walking. Tactile cues needed on rocker board to keep body from leaning.     Rehab Potential  Good    PT Frequency  2x / week    PT Duration  8 weeks    PT Treatment/Interventions  ADLs/Self Care Home Management;Cryotherapy;Electrical Stimulation;Iontophoresis 4mg /ml Dexamethasone;Gait training;Stair training;Functional mobility training;Therapeutic activities;Therapeutic exercise;Balance training;Neuromuscular re-education;Patient/family education;Manual techniques;Vasopneumatic Device    PT Next Visit Plan  Continue work on ROM       Patient will benefit from skilled therapeutic intervention in order to improve the following deficits and impairments:  Abnormal gait, Decreased range of motion, Difficulty walking, Pain, Decreased activity tolerance, Decreased balance, Impaired flexibility, Decreased strength, Decreased mobility, Increased edema  Visit Diagnosis: Stiffness of right ankle, not elsewhere classified  Pain in right ankle and joints of right foot  Localized edema     Problem List Patient Active Problem List   Diagnosis Date Noted  . Localized osteoarthritis of right ankle 10/02/2017  . Retained orthopedic hardware 10/02/2017  . Bimalleolar ankle fracture, right, closed, initial encounter 03/20/2017  . Bimalleolar fracture of right ankle 03/20/2017  . Depression with anxiety 12/25/2011  . HTN (hypertension) 12/25/2011  . GERD (gastroesophageal reflux disease) 12/25/2011  . COPD, moderate ([place]) 12/25/2011  . Chronic constipation 12/25/2011    Scot Jun,  PTA 07/15/2018, 11:51 AM  [place], [place], [address] Phone: [phone]   Fax:  [phone]  Name: Kenzy Campoverde MRN: [id number] Date of Birth: 1946-01-21

## 2018-07-17 ENCOUNTER — Ambulatory Visit: Payer: Medicare Other | Admitting: Physical Therapy

## 2018-07-17 ENCOUNTER — Encounter: Payer: Self-pay | Admitting: Physical Therapy

## 2018-07-17 DIAGNOSIS — M25671 Stiffness of right ankle, not elsewhere classified: Secondary | ICD-10-CM

## 2018-07-17 DIAGNOSIS — R262 Difficulty in walking, not elsewhere classified: Secondary | ICD-10-CM

## 2018-07-17 DIAGNOSIS — M25571 Pain in right ankle and joints of right foot: Secondary | ICD-10-CM

## 2018-07-17 DIAGNOSIS — R600 Localized edema: Secondary | ICD-10-CM

## 2018-07-17 DIAGNOSIS — M19071 Primary osteoarthritis, right ankle and foot: Secondary | ICD-10-CM

## 2018-07-17 NOTE — Therapy (Signed)
[place] [address] [place], [place], [address] Phone: [phone]   Fax:  [phone]  Physical Therapy Treatment  Patient Details  Name: [name] MRN: [id number] Date of Birth: [birth year] Referring Provider (PT): Dr. Berenice Primas   Encounter Date: 07/17/2018  PT End of Session - 07/17/18 1149    Visit Number  11    Date for PT Re-Evaluation  08/10/18    PT Start Time  1100    PT Stop Time  1147    PT Time Calculation (min)  47 min    Activity Tolerance  Patient tolerated treatment well    Behavior During Therapy  [facility] for tasks assessed/performed       Past Medical History:  Diagnosis Date  . Allergy   . Ankle fracture 03/16/2017   right  . Bulging lumbar disc    L4 OR L5 TAKES STEROID INJECTIONS FOR TOOK INJECTION MARCH 2019  . Chronic bronchitis ([place]) FEW YEARS AGO  . Chronic hepatitis C ([place])    TOOK HARVONI TX 2017   . Colon polyp   . COPD (chronic obstructive pulmonary disease) ([place])   . Diverticulitis 24YRS AGO  . Dyspnea    WITH HEAVY EXERTION  . Emphysema of lung (HCC)    TOOK PULMONARY FUCTION TEST AND PASSED FEW WEEKS AGO AT FAMILY  MD  . Gallstones   . GERD (gastroesophageal reflux disease)   . History of kidney stones 20 YRS AGO   . Hypertension   . Osteoarthritis   . Pneumonia AT BIRTH  . Spondylosis of cervical region without myelopathy or radiculopathy     Past Surgical History:  Procedure Laterality Date  . ABDOMINAL HYSTERECTOMY  1980   PARTIAL  . ANKLE ARTHROSCOPY Right 10/02/2017   Procedure: RIGHT ANKLE ARTHROSCOPY WITH EXTENSIVE DEBRIDEMENT;  Surgeon: Dorna Leitz, MD;  Location: [place];  Service: Orthopedics;  Laterality: Right;  90 MINUTES FOR CASE  . BREAST BIOPSY  02/07/2010  . BREAST SURGERY  2011   biopsy  . CHOLECYSTECTOMY  1999   LAPAROSCOPIC  . HARDWARE REMOVAL Right 10/02/2017   Procedure: HARDWARE REMOVAL;  Surgeon: Dorna Leitz, MD;  Location:  [facility];  Service: Orthopedics;  Laterality: Right;  . ORIF ANKLE FRACTURE Right 03/20/2017   Procedure: OPEN REDUCTION INTERNAL FIXATION (ORIF) ANKLE FRACTURE;  Surgeon: Dorna Leitz, MD;  Location: [place];  Service: Orthopedics;  Laterality: Right;  . TONSILLECTOMY AND ADENOIDECTOMY  1950's    There were no vitals filed for this visit.  Subjective Assessment - 07/17/18 1109    Subjective  "Good, mu balloon don't feel like a balloon anymore"    Currently in Pain?  No/denies                       [facility] Adult PT Treatment/Exercise - 07/17/18 0001      Ambulation/Gait   Gait Comments  2 flight then back down outside 1.5 laps around building.       Ultrasound   Ultrasound Location  R Lateral/anterior ankle    Ultrasound Parameters  3.3MHz 1.1w/cm2    Ultrasound Goals  Edema;Pain      Ankle Exercises: Aerobic   Elliptical  I13 R7 x 4 min    Recumbent Bike  L2 x 4 min       Ankle Exercises: Machines for Strengthening   Cybex Leg Press  40lb 2x15, Heel raises 40lb 2x20  Ankle Exercises: Stretches   Gastroc Stretch  4 reps;20 seconds               PT Short Term Goals - 06/12/18 0841      PT SHORT TERM GOAL #1   Title  patient to be independent with initial HEP    Status  Achieved        PT Long Term Goals - 07/15/18 1149      PT LONG TERM GOAL #1   Title  patient to be independent with advanced HEP    Status  Achieved      PT LONG TERM GOAL #2   Title  patient to improve R ankle AROM to [facility] to allow for improved gait mechanics    Status  On-going      PT LONG TERM GOAL #3   Title  patient to demonstrate good heel strike and appropriate gait mechanics with good stability and minimal deviation on level and unlevel surfaces    Status  On-going      PT LONG TERM GOAL #4   Title  patient to improve R ankle strength to >/= 4/5    Status  On-going      PT LONG TERM GOAL #5   Title  stand on the right leg  >10 seconds without holding on    Status  On-going            Plan - 07/17/18 1150    Clinical Impression Statement  Pt continues to do well overall functionally but decrease L ankle DF causes some compensation. She repots that the [place] is helping with her ankle swelling. She did well ambulating up and down slopes.     Rehab Potential  Good    PT Frequency  2x / week    PT Duration  8 weeks    PT Next Visit Plan  Continue work on R ankel ROM       Patient will benefit from skilled therapeutic intervention in order to improve the following deficits and impairments:  Abnormal gait, Decreased range of motion, Difficulty walking, Pain, Decreased activity tolerance, Decreased balance, Impaired flexibility, Decreased strength, Decreased mobility, Increased edema  Visit Diagnosis: Stiffness of right ankle, not elsewhere classified  Pain in right ankle and joints of right foot  Localized edema  Localized osteoarthritis of right ankle  Difficulty in walking, not elsewhere classified     Problem List Patient Active Problem List   Diagnosis Date Noted  . Localized osteoarthritis of right ankle 10/02/2017  . Retained orthopedic hardware 10/02/2017  . Bimalleolar ankle fracture, right, closed, initial encounter 03/20/2017  . Bimalleolar fracture of right ankle 03/20/2017  . Depression with anxiety 12/25/2011  . HTN (hypertension) 12/25/2011  . GERD (gastroesophageal reflux disease) 12/25/2011  . COPD, moderate ([place]) 12/25/2011  . Chronic constipation 12/25/2011    Scot Jun, PTA 07/17/2018, 11:52 AM  [place], [place], [address] Phone: [phone]   Fax:  [phone]  Name: Faythe Heitzenrater MRN: [id number] Date of Birth: 09/20/1945

## 2018-07-22 ENCOUNTER — Ambulatory Visit: Payer: Medicare Other | Admitting: Physical Therapy

## 2018-07-22 ENCOUNTER — Encounter: Payer: Self-pay | Admitting: Physical Therapy

## 2018-07-22 DIAGNOSIS — M25571 Pain in right ankle and joints of right foot: Secondary | ICD-10-CM

## 2018-07-22 DIAGNOSIS — R600 Localized edema: Secondary | ICD-10-CM

## 2018-07-22 DIAGNOSIS — M25671 Stiffness of right ankle, not elsewhere classified: Secondary | ICD-10-CM

## 2018-07-22 NOTE — Therapy (Signed)
[name] [place], [place], [address] Phone: [phone]   Fax:  [phone]  Physical Therapy Treatment  Patient Details  Name: [name] MRN: [id number] Date of Birth: Mar 15, 1946 Referring Provider (PT): Dr. Berenice Primas   Encounter Date: 07/22/2018  PT End of Session - 07/22/18 1347    Visit Number  12    Date for PT Re-Evaluation  08/10/18    PT Start Time  1300    PT Stop Time  1345    PT Time Calculation (min)  45 min    Activity Tolerance  Patient tolerated treatment well    Behavior During Therapy  [facility] for tasks assessed/performed       Past Medical History:  Diagnosis Date  . Allergy   . Ankle fracture 03/16/2017   right  . Bulging lumbar disc    L4 OR L5 TAKES STEROID INJECTIONS FOR TOOK INJECTION MARCH 2019  . Chronic bronchitis ([place]) FEW YEARS AGO  . Chronic hepatitis C ([place])    TOOK HARVONI TX 2017   . Colon polyp   . COPD (chronic obstructive pulmonary disease) ([place])   . Diverticulitis 7YRS AGO  . Dyspnea    WITH HEAVY EXERTION  . Emphysema of lung (HCC)    TOOK PULMONARY FUCTION TEST AND PASSED FEW WEEKS AGO AT FAMILY  MD  . Gallstones   . GERD (gastroesophageal reflux disease)   . History of kidney stones 20 YRS AGO   . Hypertension   . Osteoarthritis   . Pneumonia AT BIRTH  . Spondylosis of cervical region without myelopathy or radiculopathy     Past Surgical History:  Procedure Laterality Date  . ABDOMINAL HYSTERECTOMY  1980   PARTIAL  . ANKLE ARTHROSCOPY Right 10/02/2017   Procedure: RIGHT ANKLE ARTHROSCOPY WITH EXTENSIVE DEBRIDEMENT;  Surgeon: Dorna Leitz, MD;  Location: [place];  Service: Orthopedics;  Laterality: Right;  90 MINUTES FOR CASE  . BREAST BIOPSY  02/07/2010  . BREAST SURGERY  2011   biopsy  . CHOLECYSTECTOMY  1999   LAPAROSCOPIC  . HARDWARE REMOVAL Right 10/02/2017   Procedure: HARDWARE REMOVAL;  Surgeon: Dorna Leitz, MD;  [facility];  Service: Orthopedics;  Laterality: Right;  . ORIF ANKLE FRACTURE Right 03/20/2017   Procedure: OPEN REDUCTION INTERNAL FIXATION (ORIF) ANKLE FRACTURE;  Surgeon: Dorna Leitz, MD;  Location: [place];  Service: Orthopedics;  Laterality: Right;  . TONSILLECTOMY AND ADENOIDECTOMY  1950's    There were no vitals filed for this visit.  Subjective Assessment - 07/22/18 1304    Subjective  "I feel ok"    Currently in Pain?  No/denies                       [facility] Adult PT Treatment/Exercise - 07/22/18 0001      Ambulation/Gait   Gait Comments  3 flight,       High Level Balance   High Level Balance Comments  SLS 10 sec x 3 each       Ultrasound   Ultrasound Location  R lateral/anteerior ankle     Ultrasound Parameters  3.3Mhz 1.2w/cm2    Ultrasound Goals  Edema;Pain      Ankle Exercises: Aerobic   Recumbent Bike  L1 x 4 min       Ankle Exercises: Seated   Other Seated Ankle Exercises  Low squats 2x10  Ankle Exercises: Standing   Other Standing Ankle Exercises  resisted gait 40lb 4 way x5 each       Ankle Exercises: Stretches   Gastroc Stretch  20 seconds;3 reps;10 seconds               PT Short Term Goals - 06/12/18 0841      PT SHORT TERM GOAL #1   Title  patient to be independent with initial HEP    Status  Achieved        PT Long Term Goals - 07/22/18 1328      PT LONG TERM GOAL #2   Title  patient to improve R ankle AROM to [facility] to allow for improved gait mechanics    Status  On-going      PT LONG TERM GOAL #3   Title  patient to demonstrate good heel strike and appropriate gait mechanics with good stability and minimal deviation on level and unlevel surfaces    Status  Partially Met      PT LONG TERM GOAL #5   Title  stand on the right leg >10 seconds without holding on    Status  Achieved            Plan - 07/22/18 1348    Clinical Impression Statement  Pt with some fatigue  with stair negotiation. She continues to respond positive to [place]. Cues needed to push hips forward with calve stretching. The lack of R ankle DF continues to cause pt to compensate.     Rehab Potential  Good    PT Treatment/Interventions  ADLs/Self Care Home Management;Cryotherapy;Electrical Stimulation;Iontophoresis 66m/ml Dexamethasone;Gait training;Stair training;Functional mobility training;Therapeutic activities;Therapeutic exercise;Balance training;Neuromuscular re-education;Patient/family education;Manual techniques;Vasopneumatic Device    PT Next Visit Plan  Continue work on R ankel ROM       Patient will benefit from skilled therapeutic intervention in order to improve the following deficits and impairments:  Abnormal gait, Decreased range of motion, Difficulty walking, Pain, Decreased activity tolerance, Decreased balance, Impaired flexibility, Decreased strength, Decreased mobility, Increased edema  Visit Diagnosis: Stiffness of right ankle, not elsewhere classified  Pain in right ankle and joints of right foot  Localized edema     Problem List Patient Active Problem List   Diagnosis Date Noted  . Localized osteoarthritis of right ankle 10/02/2017  . Retained orthopedic hardware 10/02/2017  . Bimalleolar ankle fracture, right, closed, initial encounter 03/20/2017  . Bimalleolar fracture of right ankle 03/20/2017  . Depression with anxiety 12/25/2011  . HTN (hypertension) 12/25/2011  . GERD (gastroesophageal reflux disease) 12/25/2011  . [place] 12/25/2011  . Chronic constipation 12/25/2011    RScot Jun PTA 07/22/2018, 1:50 PM  [place] [address]Phone: [phone]  Fax:  [phone] Name: Kelsey NorgardMRN: [id number] of Birth: 7October 22, 1947

## 2018-07-24 ENCOUNTER — Encounter: Payer: Self-pay | Admitting: Physical Therapy

## 2018-07-24 ENCOUNTER — Ambulatory Visit: Payer: Medicare Other | Admitting: Physical Therapy

## 2018-07-24 DIAGNOSIS — M25571 Pain in right ankle and joints of right foot: Secondary | ICD-10-CM

## 2018-07-24 DIAGNOSIS — M25671 Stiffness of right ankle, not elsewhere classified: Secondary | ICD-10-CM | POA: Diagnosis not present

## 2018-07-24 DIAGNOSIS — R600 Localized edema: Secondary | ICD-10-CM

## 2018-07-24 NOTE — Therapy (Signed)
[place], [place], [address] Phone: [phone]   Fax:  [phone]  Physical Therapy Treatment  Patient Details  Name: [name] MRN: [id number] Date of Birth: 1945/09/19 Referring Provider (PT): Dr. Berenice Primas   Encounter Date: 07/24/2018  PT End of Session - 07/24/18 1147    Visit Number  13    Date for PT Re-Evaluation  08/10/18    PT Start Time  1100    PT Stop Time  1145    PT Time Calculation (min)  45 min    Activity Tolerance  Patient tolerated treatment well    Behavior During Therapy  [facility] for tasks assessed/performed       Past Medical History:  Diagnosis Date  . Allergy   . Ankle fracture 03/16/2017   right  . Bulging lumbar disc    L4 OR L5 TAKES STEROID INJECTIONS FOR TOOK INJECTION MARCH 2019  . Chronic bronchitis ([place]) FEW YEARS AGO  . Chronic hepatitis C ([place])    TOOK HARVONI TX 2017   . Colon polyp   . COPD (chronic obstructive pulmonary disease) ([place])   . Diverticulitis 69YRS AGO  . Dyspnea    WITH HEAVY EXERTION  . Emphysema of lung (HCC)    TOOK PULMONARY FUCTION TEST AND PASSED FEW WEEKS AGO AT FAMILY  MD  . Gallstones   . GERD (gastroesophageal reflux disease)   . History of kidney stones 20 YRS AGO   . Hypertension   . Osteoarthritis   . Pneumonia AT BIRTH  . Spondylosis of cervical region without myelopathy or radiculopathy     Past Surgical History:  Procedure Laterality Date  . ABDOMINAL HYSTERECTOMY  1980   PARTIAL  . ANKLE ARTHROSCOPY Right 10/02/2017   Procedure: RIGHT ANKLE ARTHROSCOPY WITH EXTENSIVE DEBRIDEMENT;  Surgeon: Dorna Leitz, MD;  Location: [place];  Service: Orthopedics;  Laterality: Right;  90 MINUTES FOR CASE  . BREAST BIOPSY  02/07/2010  . BREAST SURGERY  2011   biopsy  . CHOLECYSTECTOMY  1999   LAPAROSCOPIC  . HARDWARE REMOVAL Right 10/02/2017   Procedure: HARDWARE REMOVAL;  Surgeon: Dorna Leitz, MD;  Location:  [facility];  Service: Orthopedics;  Laterality: Right;  . ORIF ANKLE FRACTURE Right 03/20/2017   Procedure: OPEN REDUCTION INTERNAL FIXATION (ORIF) ANKLE FRACTURE;  Surgeon: Dorna Leitz, MD;  Location: [place];  Service: Orthopedics;  Laterality: Right;  . TONSILLECTOMY AND ADENOIDECTOMY  1950's    There were no vitals filed for this visit.  Subjective Assessment - 07/24/18 1103    Subjective  "Im a four today" "I have not take my pill, I take it at lunch"    Currently in Pain?  Yes    Pain Score  4     Pain Location  Ankle    Pain Orientation  Right                       [facility] Adult PT Treatment/Exercise - 07/24/18 0001      Ambulation/Gait   Gait Comments  2 flight then back down outside, backwards up and diwn hill, side step to each side up and down hill.       Ultrasound   Ultrasound Location  R laateral/anterior a    Ultrasound Parameters  3.3Mhz 1.1w/cm2    Ultrasound Goals  Edema;Pain      Ankle Exercises: Aerobic   Elliptical  I1 R7 x 4 min    Recumbent Bike  L1 x 5 min     Nustep  L4 x 3 min LE only      Ankle Exercises: Standing   Other Standing Ankle Exercises  Heel raises black bar 2x15       Ankle Exercises: Stretches   Gastroc Stretch  20 seconds;10 seconds;4 reps      Ankle Exercises: Seated   Other Seated Ankle Exercises  4 wat green tband x15 each               PT Short Term Goals - 06/12/18 0841      PT SHORT TERM GOAL #1   Title  patient to be independent with initial HEP    Status  Achieved        PT Long Term Goals - 07/24/18 1147      PT LONG TERM GOAL #3   Title  patient to demonstrate good heel strike and appropriate gait mechanics with good stability and minimal deviation on level and unlevel surfaces    Status  Partially Met            Plan - 07/24/18 1148    Clinical Impression Statement  Pt with a mild increase in R ankle;le DF but it remains limited. Ankle  articulated well with backwards walking up hill. Verbal and tactile cues needed to keep hips square with up and down hill side stepping. Pt unable to control the eccentric phase of resisted eversion despite cues pt reports "I cant do it."    PT Frequency  2x / week    PT Duration  8 weeks    PT Treatment/Interventions  ADLs/Self Care Home Management;Cryotherapy;Electrical Stimulation;Iontophoresis 93m/ml Dexamethasone;Gait training;Stair training;Functional mobility training;Therapeutic activities;Therapeutic exercise;Balance training;Neuromuscular re-education;Patient/family education;Manual techniques;Vasopneumatic Device    PT Next Visit Plan  Continue work on R ankel ROM       Patient will benefit from skilled therapeutic intervention in order to improve the following deficits and impairments:  Abnormal gait, Decreased range of motion, Difficulty walking, Pain, Decreased activity tolerance, Decreased balance, Impaired flexibility, Decreased strength, Decreased mobility, Increased edema  Visit Diagnosis: Stiffness of right ankle, not elsewhere classified  Pain in right ankle and joints of right foot  Localized edema     Problem List Patient Active Problem List   Diagnosis Date Noted  . Localized osteoarthritis of right ankle 10/02/2017  . Retained orthopedic hardware 10/02/2017  . Bimalleolar ankle fracture, right, closed, initial encounter 03/20/2017  . Bimalleolar fracture of right ankle 03/20/2017  . Depression with anxiety 12/25/2011  . HTN (hypertension) 12/25/2011  . GERD (gastroesophageal reflux disease) 12/25/2011  . COPD, moderate ([place] 12/25/2011  . Chronic constipation 12/25/2011    RScot Jun PTA 07/24/2018, 11:50 AM  [place] [address]Phone: [phone]  Fax:  [phone] Name: Kelsey UriarteMRN: [id number] of Birth: [birth year]

## 2018-07-29 ENCOUNTER — Ambulatory Visit: Payer: Medicare Other | Admitting: Physical Therapy

## 2018-07-29 ENCOUNTER — Encounter: Payer: Self-pay | Admitting: Physical Therapy

## 2018-07-29 DIAGNOSIS — R600 Localized edema: Secondary | ICD-10-CM

## 2018-07-29 DIAGNOSIS — M25571 Pain in right ankle and joints of right foot: Secondary | ICD-10-CM

## 2018-07-29 DIAGNOSIS — M25671 Stiffness of right ankle, not elsewhere classified: Secondary | ICD-10-CM | POA: Diagnosis not present

## 2018-07-29 NOTE — Therapy (Signed)
[place], [place], [address] Phone: [phone]   Fax:  [phone]  Physical Therapy Treatment  Patient Details  Name: [name] MRN: [id number] Date of Birth: 1945/11/06 Referring Provider (PT): Dr. Berenice Primas   Encounter Date: 07/29/2018  PT End of Session - 07/29/18 1145    Visit Number  14    Date for PT Re-Evaluation  08/10/18    PT Start Time  1100    PT Stop Time  1145    PT Time Calculation (min)  45 min       Past Medical History:  Diagnosis Date  . Allergy   . Ankle fracture 03/16/2017   right  . Bulging lumbar disc    L4 OR L5 TAKES STEROID INJECTIONS FOR TOOK INJECTION MARCH 2019  . Chronic bronchitis ([place]) FEW YEARS AGO  . Chronic hepatitis C ([place])    TOOK HARVONI TX 2017   . Colon polyp   . COPD (chronic obstructive pulmonary disease) ([place])   . Diverticulitis 22YRS AGO  . Dyspnea    WITH HEAVY EXERTION  . Emphysema of lung (HCC)    TOOK PULMONARY FUCTION TEST AND PASSED FEW WEEKS AGO AT FAMILY  MD  . Gallstones   . GERD (gastroesophageal reflux disease)   . History of kidney stones 20 YRS AGO   . Hypertension   . Osteoarthritis   . Pneumonia AT BIRTH  . Spondylosis of cervical region without myelopathy or radiculopathy     Past Surgical History:  Procedure Laterality Date  . ABDOMINAL HYSTERECTOMY  1980   PARTIAL  . ANKLE ARTHROSCOPY Right 10/02/2017   Procedure: RIGHT ANKLE ARTHROSCOPY WITH EXTENSIVE DEBRIDEMENT;  Surgeon: Dorna Leitz, MD;  Location: [place];  Service: Orthopedics;  Laterality: Right;  90 MINUTES FOR CASE  . BREAST BIOPSY  02/07/2010  . BREAST SURGERY  2011   biopsy  . CHOLECYSTECTOMY  1999   LAPAROSCOPIC  . HARDWARE REMOVAL Right 10/02/2017   Procedure: HARDWARE REMOVAL;  Surgeon: Dorna Leitz, MD;  Location: [facility];  Service: Orthopedics;  Laterality: Right;  . ORIF ANKLE FRACTURE Right 03/20/2017    Procedure: OPEN REDUCTION INTERNAL FIXATION (ORIF) ANKLE FRACTURE;  Surgeon: Dorna Leitz, MD;  Location: [place];  Service: Orthopedics;  Laterality: Right;  . TONSILLECTOMY AND ADENOIDECTOMY  1950's    There were no vitals filed for this visit.  Subjective Assessment - 07/29/18 1103    Subjective  "good'' "we are so close"    Currently in Pain?  Yes    Pain Score  3                        [facility] Adult PT Treatment/Exercise - 07/29/18 0001      Ambulation/Gait   Gait Comments  2 flight then back down outside, backwards up and down hill, side step to each side up and down hill.       Ultrasound   Ultrasound Location  R lateral/anterior ankle    Ultrasound Parameters  3.3Mhz 1.2w/cm2    Ultrasound Goals  Edema;Pain      Manual Therapy   Manual Therapy  Passive ROM;Joint mobilization    Manual therapy comments  some PROM taken to end range and held    Joint Mobilization  posterior/anterior grads 4 R ankle, Scar mobilization lateral R ankle    Passive ROM  R ankle all directions  Ankle Exercises: Aerobic   Elliptical  I1 R7 x 4 min    Recumbent Bike  L1 x 5 min       Ankle Exercises: Standing   Other Standing Ankle Exercises  Heel raises black bar 3x15       Ankle Exercises: Stretches   Gastroc Stretch  20 seconds;10 seconds;4 reps               PT Short Term Goals - 06/12/18 0841      PT SHORT TERM GOAL #1   Title  patient to be independent with initial HEP    Status  Achieved        PT Long Term Goals - 07/29/18 1145      PT LONG TERM GOAL #2   Title  patient to improve R ankle AROM to [facility] to allow for improved gait mechanics    Status  On-going            Plan - 07/29/18 1146    Clinical Impression Statement  Limited R ankle causes pt to compensate with functional activities. No functional limitations just some mild limping  when walking. Cues to keep hips square with side step up and down hill. Tactile cues's  to lean forward with gastroc stretch. Pt continues to be sensitive ober scar on lateral R ankle. "I don't like things to touch it". Expressed the importance of scar mobilization.    PT Frequency  2x / week    PT Duration  8 weeks    PT Treatment/Interventions  ADLs/Self Care Home Management;Cryotherapy;Electrical Stimulation;Iontophoresis 4mg /ml Dexamethasone;Gait training;Stair training;Functional mobility training;Therapeutic activities;Therapeutic exercise;Balance training;Neuromuscular re-education;Patient/family education;Manual techniques;Vasopneumatic Device    PT Next Visit Plan  Continue work on R ankel ROM       Patient will benefit from skilled therapeutic intervention in order to improve the following deficits and impairments:  Abnormal gait, Decreased range of motion, Difficulty walking, Pain, Decreased activity tolerance, Decreased balance, Impaired flexibility, Decreased strength, Decreased mobility, Increased edema  Visit Diagnosis: Stiffness of right ankle, not elsewhere classified  Pain in right ankle and joints of right foot  Localized edema     Problem List Patient Active Problem List   Diagnosis Date Noted  . Localized osteoarthritis of right ankle 10/02/2017  . Retained orthopedic hardware 10/02/2017  . Bimalleolar ankle fracture, right, closed, initial encounter 03/20/2017  . Bimalleolar fracture of right ankle 03/20/2017  . Depression with anxiety 12/25/2011  . HTN (hypertension) 12/25/2011  . GERD (gastroesophageal reflux disease) 12/25/2011  . COPD, moderate ([place]) 12/25/2011  . Chronic constipation 12/25/2011    Scot Jun 07/29/2018, 11:55 AM  [place] [address] [place], [place], [address] Phone: [phone]   Fax:  [phone]  Name: [name] MRN: [id number] Date of Birth: 04-13-1946

## 2018-07-31 ENCOUNTER — Encounter: Payer: Self-pay | Admitting: Physical Therapy

## 2018-07-31 ENCOUNTER — Ambulatory Visit: Payer: Medicare Other | Admitting: Physical Therapy

## 2018-07-31 DIAGNOSIS — M25571 Pain in right ankle and joints of right foot: Secondary | ICD-10-CM

## 2018-07-31 DIAGNOSIS — R600 Localized edema: Secondary | ICD-10-CM

## 2018-07-31 DIAGNOSIS — M25671 Stiffness of right ankle, not elsewhere classified: Secondary | ICD-10-CM

## 2018-07-31 DIAGNOSIS — M19071 Primary osteoarthritis, right ankle and foot: Secondary | ICD-10-CM

## 2018-07-31 DIAGNOSIS — R2689 Other abnormalities of gait and mobility: Secondary | ICD-10-CM

## 2018-07-31 DIAGNOSIS — R262 Difficulty in walking, not elsewhere classified: Secondary | ICD-10-CM

## 2018-07-31 NOTE — Therapy (Signed)
[place], [place], [address] Phone: [phone]   Fax:  [phone]  Physical Therapy Treatment  Patient Details  Name: [name] MRN: [id number] Date of Birth: April 02, 1946 Referring Provider (PT): Dr. Berenice Primas   Encounter Date: 07/31/2018  PT End of Session - 07/31/18 1149    Visit Number  15    Date for PT Re-Evaluation  08/10/18    PT Start Time  1100    PT Stop Time  1145    PT Time Calculation (min)  45 min    Activity Tolerance  Patient tolerated treatment well    Behavior During Therapy  [facility] for tasks assessed/performed       Past Medical History:  Diagnosis Date  . Allergy   . Ankle fracture 03/16/2017   right  . Bulging lumbar disc    L4 OR L5 TAKES STEROID INJECTIONS FOR TOOK INJECTION MARCH 2019  . Chronic bronchitis ([place]) FEW YEARS AGO  . Chronic hepatitis C ([place])    TOOK HARVONI TX 2017   . Colon polyp   . COPD (chronic obstructive pulmonary disease) ([place])   . Diverticulitis 8YRS AGO  . Dyspnea    WITH HEAVY EXERTION  . Emphysema of lung (HCC)    TOOK PULMONARY FUCTION TEST AND PASSED FEW WEEKS AGO AT FAMILY  MD  . Gallstones   . GERD (gastroesophageal reflux disease)   . History of kidney stones 20 YRS AGO   . Hypertension   . Osteoarthritis   . Pneumonia AT BIRTH  . Spondylosis of cervical region without myelopathy or radiculopathy     Past Surgical History:  Procedure Laterality Date  . ABDOMINAL HYSTERECTOMY  1980   PARTIAL  . ANKLE ARTHROSCOPY Right 10/02/2017   Procedure: RIGHT ANKLE ARTHROSCOPY WITH EXTENSIVE DEBRIDEMENT;  Surgeon: Dorna Leitz, MD;  Location: [place];  Service: Orthopedics;  Laterality: Right;  90 MINUTES FOR CASE  . BREAST BIOPSY  02/07/2010  . BREAST SURGERY  2011   biopsy  . CHOLECYSTECTOMY  1999   LAPAROSCOPIC  . HARDWARE REMOVAL Right 10/02/2017   Procedure: HARDWARE REMOVAL;  Surgeon: Dorna Leitz, MD;  Location:  [facility];  Service: Orthopedics;  Laterality: Right;  . ORIF ANKLE FRACTURE Right 03/20/2017   Procedure: OPEN REDUCTION INTERNAL FIXATION (ORIF) ANKLE FRACTURE;  Surgeon: Dorna Leitz, MD;  Location: [place];  Service: Orthopedics;  Laterality: Right;  . TONSILLECTOMY AND ADENOIDECTOMY  1950's    There were no vitals filed for this visit.  Subjective Assessment - 07/31/18 1103    Subjective  "Fine"    Currently in Pain?  Yes    Pain Score  3     Pain Location  Ankle    Pain Orientation  Right                       [facility] Adult PT Treatment/Exercise - 07/31/18 0001      Ultrasound   Ultrasound Location  R lateral/ antrerior ankle     Ultrasound Parameters  3.3MhZ 1.1w/cm2    Ultrasound Goals  Edema;Pain      Manual Therapy   Manual Therapy  Passive ROM;Joint mobilization    Manual therapy comments  some PROM taken to end range and held    Joint Mobilization  posterior/anterior grads 4 R ankle, Scar mobilization lateral R ankle    Passive ROM  R ankle all directions  Ankle Exercises: Aerobic   Elliptical  I13 R7 x 4 min    Tread Mill  backwards  1 mph x1 min     Recumbent Bike  L2 x 5 min       Ankle Exercises: Standing   Other Standing Ankle Exercises  Heel raises black bar 2x15     Other Standing Ankle Exercises  6in step downs 2x10       Ankle Exercises: Seated   Other Seated Ankle Exercises  4 wat green tband x15 each               PT Short Term Goals - 06/12/18 0841      PT SHORT TERM GOAL #1   Title  patient to be independent with initial HEP    Status  Achieved        PT Long Term Goals - 07/31/18 1149      PT LONG TERM GOAL #2   Title  patient to improve R ankle AROM to [facility] to allow for improved gait mechanics    Status  On-going      PT LONG TERM GOAL #3   Title  patient to demonstrate good heel strike and appropriate gait mechanics with good stability and minimal deviation on level and  unlevel surfaces    Status  Achieved      PT LONG TERM GOAL #4   Status  Achieved            Plan - 07/31/18 1150    Clinical Impression Statement  R ankle continues to be limited with Dorsi flexion. Firm end feel with MT. Some PROM taken to end range. Pt is less sensitive over her scar. Cues to prevent postural compensation with heel raises. Tactile cues to keep LE still with seated tband exercises.     Rehab Potential  Good    PT Duration  8 weeks    PT Treatment/Interventions  ADLs/Self Care Home Management;Cryotherapy;Electrical Stimulation;Iontophoresis 4mg /ml Dexamethasone;Gait training;Stair training;Functional mobility training;Therapeutic activities;Therapeutic exercise;Balance training;Neuromuscular re-education;Patient/family education;Manual techniques;Vasopneumatic Device    PT Next Visit Plan  Continue work on R ankel ROM D/C in next two visits       Patient will benefit from skilled therapeutic intervention in order to improve the following deficits and impairments:  Abnormal gait, Decreased range of motion, Difficulty walking, Pain, Decreased activity tolerance, Decreased balance, Impaired flexibility, Decreased strength, Decreased mobility, Increased edema  Visit Diagnosis: No diagnosis found.     Problem List Patient Active Problem List   Diagnosis Date Noted  . Localized osteoarthritis of right ankle 10/02/2017  . Retained orthopedic hardware 10/02/2017  . Bimalleolar ankle fracture, right, closed, initial encounter 03/20/2017  . Bimalleolar fracture of right ankle 03/20/2017  . Depression with anxiety 12/25/2011  . HTN (hypertension) 12/25/2011  . GERD (gastroesophageal reflux disease) 12/25/2011  . COPD, moderate ([place]) 12/25/2011  . Chronic constipation 12/25/2011    Scot Jun 07/31/2018, 11:52 AM  [place] [address] [place], [place], [address] Phone: [phone]   Fax:   [phone]  Name: [name] MRN: [id number] Date of Birth: 1946/03/13

## 2018-08-05 ENCOUNTER — Encounter: Payer: Self-pay | Admitting: Physical Therapy

## 2018-08-05 ENCOUNTER — Ambulatory Visit: Payer: Medicare Other | Attending: Orthopedic Surgery | Admitting: Physical Therapy

## 2018-08-05 DIAGNOSIS — M25671 Stiffness of right ankle, not elsewhere classified: Secondary | ICD-10-CM | POA: Diagnosis present

## 2018-08-05 DIAGNOSIS — M25571 Pain in right ankle and joints of right foot: Secondary | ICD-10-CM | POA: Insufficient documentation

## 2018-08-05 DIAGNOSIS — R262 Difficulty in walking, not elsewhere classified: Secondary | ICD-10-CM | POA: Insufficient documentation

## 2018-08-05 DIAGNOSIS — M19071 Primary osteoarthritis, right ankle and foot: Secondary | ICD-10-CM | POA: Diagnosis present

## 2018-08-05 DIAGNOSIS — R600 Localized edema: Secondary | ICD-10-CM | POA: Insufficient documentation

## 2018-08-05 NOTE — Therapy (Signed)
[place], [place], [address] Phone: [phone]   Fax:  [phone]  Physical Therapy Treatment  Patient Details  Name: [name] MRN: [id number] Date of Birth: 02-14-1946 Referring Provider (PT): Dr. Berenice Primas   Encounter Date: 08/05/2018  PT End of Session - 08/05/18 1151    Visit Number  16    Date for PT Re-Evaluation  08/10/18    PT Start Time  1100    PT Stop Time  1148    PT Time Calculation (min)  48 min       Past Medical History:  Diagnosis Date  . Allergy   . Ankle fracture 03/16/2017   right  . Bulging lumbar disc    L4 OR L5 TAKES STEROID INJECTIONS FOR TOOK INJECTION MARCH 2019  . Chronic bronchitis ([place]) FEW YEARS AGO  . Chronic hepatitis C ([place])    TOOK HARVONI TX 2017   . Colon polyp   . COPD (chronic obstructive pulmonary disease) ([place])   . Diverticulitis 57YRS AGO  . Dyspnea    WITH HEAVY EXERTION  . Emphysema of lung (HCC)    TOOK PULMONARY FUCTION TEST AND PASSED FEW WEEKS AGO AT FAMILY  MD  . Gallstones   . GERD (gastroesophageal reflux disease)   . History of kidney stones 20 YRS AGO   . Hypertension   . Osteoarthritis   . Pneumonia AT BIRTH  . Spondylosis of cervical region without myelopathy or radiculopathy     Past Surgical History:  Procedure Laterality Date  . ABDOMINAL HYSTERECTOMY  1980   PARTIAL  . ANKLE ARTHROSCOPY Right 10/02/2017   Procedure: RIGHT ANKLE ARTHROSCOPY WITH EXTENSIVE DEBRIDEMENT;  Surgeon: Dorna Leitz, MD;  Location: [place];  Service: Orthopedics;  Laterality: Right;  90 MINUTES FOR CASE  . BREAST BIOPSY  02/07/2010  . BREAST SURGERY  2011   biopsy  . CHOLECYSTECTOMY  1999   LAPAROSCOPIC  . HARDWARE REMOVAL Right 10/02/2017   Procedure: HARDWARE REMOVAL;  Surgeon: Dorna Leitz, MD;  Location: [facility];  Service: Orthopedics;  Laterality: Right;  . ORIF ANKLE FRACTURE Right 03/20/2017   Procedure: OPEN REDUCTION INTERNAL FIXATION (ORIF) ANKLE FRACTURE;  Surgeon: Dorna Leitz, MD;  Location: [place];  Service: Orthopedics;  Laterality: Right;  . TONSILLECTOMY AND ADENOIDECTOMY  1950's    There were no vitals filed for this visit.  Subjective Assessment - 08/05/18 1104    Subjective  "good"    Currently in Pain?  Yes    Pain Score  3     Pain Location  Ankle    Pain Orientation  Right                       [facility] Adult PT Treatment/Exercise - 08/05/18 0001      Ambulation/Gait   Gait Comments  2 flight then back down outside, backwards up and diwn hill, side step to each side up and down hill.       Ultrasound   Ultrasound Location  Rlateral/anterior ankle    Ultrasound Parameters  3.3Mhz 1.2w/cm2    Ultrasound Goals  Edema;Pain      Ankle Exercises: Aerobic   Elliptical  I13 R7 x 5 min    Recumbent Bike  L2 x 5 min       Ankle Exercises: Machines for Strengthening   Cybex Leg Press  50lb 2x15, Heel raises 40lb  2x20      Ankle Exercises: Standing   Other Standing Ankle Exercises  Heel & toe raises black bar 2x15     Other Standing Ankle Exercises  6in step downs 2x10       Ankle Exercises: Seated   Other Seated Ankle Exercises  4 way green tband x15 each               PT Short Term Goals - 06/12/18 0841      PT SHORT TERM GOAL #1   Title  patient to be independent with initial HEP    Status  Achieved        PT Long Term Goals - 07/31/18 1149      PT LONG TERM GOAL #2   Title  patient to improve R ankle AROM to [facility] to allow for improved gait mechanics    Status  On-going      PT LONG TERM GOAL #3   Title  patient to demonstrate good heel strike and appropriate gait mechanics with good stability and minimal deviation on level and unlevel surfaces    Status  Achieved      PT LONG TERM GOAL #4   Status  Achieved            Plan - 08/05/18 1151    Clinical Impression Statement  Pt ` to have  reached a therapeutic plateau. R ankle continues to lack DF causing her to compensate with functional activities. Despite the lack of R ankle DF she has no functional limitations. Pt reports a pulling in her R ankle on leg press when extending.  weakness noted with toe raises on black bar.    Rehab Potential  Good    PT Treatment/Interventions  ADLs/Self Care Home Management;Cryotherapy;Electrical Stimulation;Iontophoresis 4mg /ml Dexamethasone;Gait training;Stair training;Functional mobility training;Therapeutic activities;Therapeutic exercise;Balance training;Neuromuscular re-education;Patient/family education;Manual techniques;Vasopneumatic Device    PT Next Visit Plan  Continue work on R ankel ROM D/C in next visit       Patient will benefit from skilled therapeutic intervention in order to improve the following deficits and impairments:  Abnormal gait, Decreased range of motion, Difficulty walking, Pain, Decreased activity tolerance, Decreased balance, Impaired flexibility, Decreased strength, Decreased mobility, Increased edema  Visit Diagnosis: Stiffness of right ankle, not elsewhere classified  Localized edema  Pain in right ankle and joints of right foot  Localized osteoarthritis of right ankle  Difficulty in walking, not elsewhere classified     Problem List Patient Active Problem List   Diagnosis Date Noted  . Localized osteoarthritis of right ankle 10/02/2017  . Retained orthopedic hardware 10/02/2017  . Bimalleolar ankle fracture, right, closed, initial encounter 03/20/2017  . Bimalleolar fracture of right ankle 03/20/2017  . Depression with anxiety 12/25/2011  . HTN (hypertension) 12/25/2011  . GERD (gastroesophageal reflux disease) 12/25/2011  . COPD, moderate ([place]) 12/25/2011  . Chronic constipation 12/25/2011    Scot Jun 08/05/2018, 11:57 AM  [place] [address] [place], [place],  [address] Phone: [phone]   Fax:  [phone]  Name: Vernella Niznik MRN: [id number] Date of Birth: June 24, 1946

## 2018-08-07 ENCOUNTER — Encounter: Payer: Self-pay | Admitting: Physical Therapy

## 2018-08-07 ENCOUNTER — Ambulatory Visit: Payer: Medicare Other | Admitting: Physical Therapy

## 2018-08-07 DIAGNOSIS — M25571 Pain in right ankle and joints of right foot: Secondary | ICD-10-CM

## 2018-08-07 DIAGNOSIS — M25671 Stiffness of right ankle, not elsewhere classified: Secondary | ICD-10-CM

## 2018-08-07 DIAGNOSIS — R600 Localized edema: Secondary | ICD-10-CM

## 2018-08-07 DIAGNOSIS — M19071 Primary osteoarthritis, right ankle and foot: Secondary | ICD-10-CM

## 2018-08-07 NOTE — Therapy (Signed)
[place], [place], [address] Phone: [phone]   Fax:  [phone]  Physical Therapy Treatment  Patient Details  Name: [name] MRN: [id number] Date of Birth: 11-23-1945 Referring Provider (PT): Dr. Berenice Primas   Encounter Date: 08/07/2018  PT End of Session - 08/07/18 1517    Visit Number  17    Date for PT Re-Evaluation  08/10/18    PT Start Time  1430    PT Stop Time  1515    PT Time Calculation (min)  45 min    Activity Tolerance  Patient tolerated treatment well    Behavior During Therapy  [facility] for tasks assessed/performed       Past Medical History:  Diagnosis Date  . Allergy   . Ankle fracture 03/16/2017   right  . Bulging lumbar disc    L4 OR L5 TAKES STEROID INJECTIONS FOR TOOK INJECTION MARCH 2019  . Chronic bronchitis ([place]) FEW YEARS AGO  . Chronic hepatitis C ([place])    TOOK HARVONI TX 2017   . Colon polyp   . COPD (chronic obstructive pulmonary disease) ([place])   . Diverticulitis 62YRS AGO  . Dyspnea    WITH HEAVY EXERTION  . Emphysema of lung (HCC)    TOOK PULMONARY FUCTION TEST AND PASSED FEW WEEKS AGO AT FAMILY  MD  . Gallstones   . GERD (gastroesophageal reflux disease)   . History of kidney stones 20 YRS AGO   . Hypertension   . Osteoarthritis   . Pneumonia AT BIRTH  . Spondylosis of cervical region without myelopathy or radiculopathy     Past Surgical History:  Procedure Laterality Date  . ABDOMINAL HYSTERECTOMY  1980   PARTIAL  . ANKLE ARTHROSCOPY Right 10/02/2017   Procedure: RIGHT ANKLE ARTHROSCOPY WITH EXTENSIVE DEBRIDEMENT;  Surgeon: Dorna Leitz, MD;  Location: [place];  Service: Orthopedics;  Laterality: Right;  90 MINUTES FOR CASE  . BREAST BIOPSY  02/07/2010  . BREAST SURGERY  2011   biopsy  . CHOLECYSTECTOMY  1999   LAPAROSCOPIC  . HARDWARE REMOVAL Right 10/02/2017   Procedure: HARDWARE REMOVAL;  Surgeon: Dorna Leitz, MD;  Location:  [facility];  Service: Orthopedics;  Laterality: Right;  . ORIF ANKLE FRACTURE Right 03/20/2017   Procedure: OPEN REDUCTION INTERNAL FIXATION (ORIF) ANKLE FRACTURE;  Surgeon: Dorna Leitz, MD;  Location: [facility];  Service: Orthopedics;  Laterality: Right;  . TONSILLECTOMY AND ADENOIDECTOMY  1950's    There were no vitals filed for this visit.  Subjective Assessment - 08/07/18 1434    Subjective  "Fine"    Currently in Pain?  No/denies    Pain Score  0-No pain         [facility] PT Assessment - 08/07/18 0001      AROM   Right/Left Ankle  Right    Right Ankle Dorsiflexion  4    Right Ankle Plantar Flexion  59    Right Ankle Inversion  40    Right Ankle Eversion  11                   [facility] Adult PT Treatment/Exercise - 08/07/18 0001      Ultrasound   Ultrasound Location  R lateral/anterior ankle    Ultrasound Parameters  3.3MhZ 1.2w/cm2     Ultrasound Goals  Edema;Pain      Ankle Exercises: Aerobic   Elliptical  I13 R7 x 5  min    Recumbent Bike  L2 x 5 min       Ankle Exercises: Seated   Other Seated Ankle Exercises  4 way green tband x15 each      Ankle Exercises: Stretches   Gastroc Stretch  20 seconds;10 seconds;4 reps      Ankle Exercises: Machines for Strengthening   Cybex Leg Press  40lb 2x15, Heel raises 40lb 2x15      Ankle Exercises: Standing   Other Standing Ankle Exercises  Heel raises black bar 2x15                PT Short Term Goals - 06/12/18 0841      PT SHORT TERM GOAL #1   Title  patient to be independent with initial HEP    Status  Achieved        PT Long Term Goals - 08/07/18 1520      PT LONG TERM GOAL #1   Title  patient to be independent with advanced HEP    Status  Achieved      PT LONG TERM GOAL #2   Title  patient to improve R ankle AROM to [facility] to allow for improved gait mechanics    Status  On-going      PT LONG TERM GOAL #3   Title  patient to demonstrate good heel strike and  appropriate gait mechanics with good stability and minimal deviation on level and unlevel surfaces    Status  Achieved      PT LONG TERM GOAL #4   Title  patient to improve R ankle strength to >/= 4/5    Status  Achieved      PT LONG TERM GOAL #5   Title  stand on the right leg >10 seconds without holding on    Status  Achieved            Plan - 08/07/18 1517    Clinical Impression Statement  Most goals met. Pt with a one degree gain in R ankle dorsi flexion, all other motions are normal. She has improved functionally overall, but still some compensation due to the lack of R ankle DF. She reports no functional limitations at home. No issues with today's interventions. Strength in R ankle is good.    Rehab Potential  Good    PT Frequency  2x / week    PT Duration  8 weeks    PT Treatment/Interventions  ADLs/Self Care Home Management;Cryotherapy;Electrical Stimulation;Iontophoresis 79m/ml Dexamethasone;Gait training;Stair training;Functional mobility training;Therapeutic activities;Therapeutic exercise;Balance training;Neuromuscular re-education;Patient/family education;Manual techniques;Vasopneumatic Device    PT Next Visit Plan  D/C PT, She reports that she will be returning to the gym       Patient will benefit from skilled therapeutic intervention in order to improve the following deficits and impairments:  Abnormal gait, Decreased range of motion, Difficulty walking, Pain, Decreased activity tolerance, Decreased balance, Impaired flexibility, Decreased strength, Decreased mobility, Increased edema  Visit Diagnosis: Stiffness of right ankle, not elsewhere classified  Pain in right ankle and joints of right foot  Localized osteoarthritis of right ankle  Localized edema     Problem List Patient Active Problem List   Diagnosis Date Noted  . Localized osteoarthritis of right ankle 10/02/2017  . Retained orthopedic hardware 10/02/2017  . Bimalleolar ankle fracture, right,  closed, initial encounter 03/20/2017  . Bimalleolar fracture of right ankle 03/20/2017  . Depression with anxiety 12/25/2011  . HTN (hypertension) 12/25/2011  . GERD (gastroesophageal reflux disease)  12/25/2011  . COPD, moderate ([place]) 12/25/2011  . Chronic constipation 12/25/2011    PHYSICAL THERAPY DISCHARGE SUMMARY  Visits from Start of Care: 17 Plan: Patient agrees to discharge.  Patient goals were partially met. Patient is being discharged due to lack of progress.  ?????     Scot Jun, PTA 08/07/2018, 3:20 PM  [place] [address] [place], [place], [address] Phone: [phone]   Fax:  [phone]  Name: [name] MRN: [id number] Date of Birth: 09-05-1945

## 2018-08-13 ENCOUNTER — Ambulatory Visit: Payer: Medicare Other

## 2018-08-14 ENCOUNTER — Ambulatory Visit
Admission: RE | Admit: 2018-08-14 | Discharge: 2018-08-14 | Disposition: A | Discharge status: Home / Self Care | Payer: Medicare Other | Source: Ambulatory Visit | Attending: Family Medicine | Admitting: Family Medicine

## 2018-08-14 DIAGNOSIS — Z1231 Encounter for screening mammogram for malignant neoplasm of breast: Secondary | ICD-10-CM

## 2018-08-18 ENCOUNTER — Other Ambulatory Visit: Payer: Self-pay | Admitting: Family Medicine

## 2018-08-18 DIAGNOSIS — R928 Other abnormal and inconclusive findings on diagnostic imaging of breast: Secondary | ICD-10-CM

## 2018-08-20 ENCOUNTER — Ambulatory Visit
Admission: RE | Admit: 2018-08-20 | Discharge: 2018-08-20 | Disposition: A | Discharge status: Home / Self Care | Payer: Medicare Other | Source: Ambulatory Visit | Attending: Family Medicine | Admitting: Family Medicine

## 2018-08-20 DIAGNOSIS — R928 Other abnormal and inconclusive findings on diagnostic imaging of breast: Secondary | ICD-10-CM

## 2018-09-12 ENCOUNTER — Other Ambulatory Visit (HOSPITAL_COMMUNITY): Payer: Self-pay | Admitting: Physician Assistant

## 2018-09-12 ENCOUNTER — Other Ambulatory Visit: Payer: Self-pay | Admitting: Physician Assistant

## 2018-09-12 DIAGNOSIS — Z720 Tobacco use: Secondary | ICD-10-CM

## 2018-09-19 ENCOUNTER — Ambulatory Visit (HOSPITAL_COMMUNITY): Payer: Medicare Other

## 2018-10-17 ENCOUNTER — Ambulatory Visit (HOSPITAL_COMMUNITY): Payer: Medicare Other

## 2018-12-08 ENCOUNTER — Other Ambulatory Visit: Payer: Self-pay

## 2018-12-08 ENCOUNTER — Ambulatory Visit (HOSPITAL_COMMUNITY)
Admission: RE | Admit: 2018-12-08 | Discharge: 2018-12-08 | Disposition: A | Discharge status: Home / Self Care | Payer: Medicare Other | Source: Ambulatory Visit | Attending: Physician Assistant | Admitting: Physician Assistant

## 2018-12-08 DIAGNOSIS — J439 Emphysema, unspecified: Secondary | ICD-10-CM | POA: Diagnosis not present

## 2018-12-08 DIAGNOSIS — Z122 Encounter for screening for malignant neoplasm of respiratory organs: Secondary | ICD-10-CM | POA: Diagnosis present

## 2018-12-08 DIAGNOSIS — F17210 Nicotine dependence, cigarettes, uncomplicated: Secondary | ICD-10-CM | POA: Insufficient documentation

## 2018-12-08 DIAGNOSIS — Z720 Tobacco use: Secondary | ICD-10-CM

## 2019-03-24 DIAGNOSIS — I700 Atherosclerosis of aorta: Secondary | ICD-10-CM | POA: Insufficient documentation

## 2019-07-21 ENCOUNTER — Other Ambulatory Visit: Payer: Self-pay | Admitting: Family Medicine

## 2019-07-21 DIAGNOSIS — Z1231 Encounter for screening mammogram for malignant neoplasm of breast: Secondary | ICD-10-CM

## 2019-08-27 ENCOUNTER — Ambulatory Visit
Admission: RE | Admit: 2019-08-27 | Discharge: 2019-08-27 | Disposition: A | Discharge status: Home / Self Care | Payer: Medicare Other | Source: Ambulatory Visit | Attending: Family Medicine | Admitting: Family Medicine

## 2019-08-27 ENCOUNTER — Other Ambulatory Visit: Payer: Self-pay

## 2019-08-27 DIAGNOSIS — Z1231 Encounter for screening mammogram for malignant neoplasm of breast: Secondary | ICD-10-CM

## 2019-11-02 DIAGNOSIS — N3281 Overactive bladder: Secondary | ICD-10-CM | POA: Insufficient documentation

## 2019-12-04 ENCOUNTER — Other Ambulatory Visit: Payer: Self-pay | Admitting: Physician Assistant

## 2019-12-04 ENCOUNTER — Other Ambulatory Visit (HOSPITAL_COMMUNITY): Payer: Self-pay | Admitting: Physician Assistant

## 2019-12-04 DIAGNOSIS — Z122 Encounter for screening for malignant neoplasm of respiratory organs: Secondary | ICD-10-CM

## 2019-12-08 ENCOUNTER — Ambulatory Visit (HOSPITAL_COMMUNITY): Payer: Medicare Other

## 2019-12-10 ENCOUNTER — Ambulatory Visit (HOSPITAL_COMMUNITY): Payer: Medicare Other

## 2019-12-24 ENCOUNTER — Ambulatory Visit (HOSPITAL_COMMUNITY): Admission: RE | Admit: 2019-12-24 | Payer: Medicare Other | Source: Ambulatory Visit

## 2019-12-24 ENCOUNTER — Encounter (HOSPITAL_COMMUNITY): Payer: Self-pay

## 2020-01-01 ENCOUNTER — Other Ambulatory Visit: Payer: Self-pay | Admitting: Ophthalmology

## 2020-01-03 ENCOUNTER — Other Ambulatory Visit: Payer: Self-pay

## 2020-01-03 ENCOUNTER — Encounter (HOSPITAL_COMMUNITY): Payer: Self-pay | Admitting: Ophthalmology

## 2020-01-03 NOTE — Progress Notes (Signed)
Pre-op phone call complete. Instructed to arrive to ED registration at 6:30am per OR. Instructed to inform ED registration that she would need a covid test in ED prior to arrival to short stay--verbalized understanding. ED charge nurse, Roselyn Reef, called and made aware of Covid testing need.  Pt instructed no jewelry, valuables, no NSAIDs, NPO after MN, visitor policy, NO SMOKING for 24 hours prior to surgery--verbalized understanding.

## 2020-01-04 ENCOUNTER — Encounter (HOSPITAL_COMMUNITY): Admission: RE | Disposition: A | Discharge status: Home / Self Care | Payer: Self-pay | Source: Home / Self Care

## 2020-01-04 ENCOUNTER — Emergency Department (HOSPITAL_COMMUNITY): Payer: Medicare Other | Admitting: Anesthesiology

## 2020-01-04 ENCOUNTER — Other Ambulatory Visit: Payer: Self-pay

## 2020-01-04 ENCOUNTER — Encounter (HOSPITAL_COMMUNITY): Payer: Self-pay | Admitting: Ophthalmology

## 2020-01-04 ENCOUNTER — Ambulatory Visit (HOSPITAL_COMMUNITY)
Admission: RE | Admit: 2020-01-04 | Discharge: 2020-01-04 | Disposition: A | Discharge status: Home / Self Care | Payer: Medicare Other | Attending: Ophthalmology | Admitting: Ophthalmology

## 2020-01-04 DIAGNOSIS — H3342 Traction detachment of retina, left eye: Secondary | ICD-10-CM | POA: Insufficient documentation

## 2020-01-04 DIAGNOSIS — M1990 Unspecified osteoarthritis, unspecified site: Secondary | ICD-10-CM | POA: Insufficient documentation

## 2020-01-04 DIAGNOSIS — Z9049 Acquired absence of other specified parts of digestive tract: Secondary | ICD-10-CM | POA: Diagnosis not present

## 2020-01-04 DIAGNOSIS — Z7982 Long term (current) use of aspirin: Secondary | ICD-10-CM | POA: Diagnosis not present

## 2020-01-04 DIAGNOSIS — Z86010 Personal history of colonic polyps: Secondary | ICD-10-CM | POA: Diagnosis not present

## 2020-01-04 DIAGNOSIS — Z823 Family history of stroke: Secondary | ICD-10-CM | POA: Diagnosis not present

## 2020-01-04 DIAGNOSIS — Z888 Allergy status to other drugs, medicaments and biological substances status: Secondary | ICD-10-CM | POA: Insufficient documentation

## 2020-01-04 DIAGNOSIS — M47812 Spondylosis without myelopathy or radiculopathy, cervical region: Secondary | ICD-10-CM | POA: Insufficient documentation

## 2020-01-04 DIAGNOSIS — Z7951 Long term (current) use of inhaled steroids: Secondary | ICD-10-CM | POA: Diagnosis not present

## 2020-01-04 DIAGNOSIS — F17210 Nicotine dependence, cigarettes, uncomplicated: Secondary | ICD-10-CM | POA: Insufficient documentation

## 2020-01-04 DIAGNOSIS — M5126 Other intervertebral disc displacement, lumbar region: Secondary | ICD-10-CM | POA: Diagnosis not present

## 2020-01-04 DIAGNOSIS — Z885 Allergy status to narcotic agent status: Secondary | ICD-10-CM | POA: Insufficient documentation

## 2020-01-04 DIAGNOSIS — Z79899 Other long term (current) drug therapy: Secondary | ICD-10-CM | POA: Insufficient documentation

## 2020-01-04 DIAGNOSIS — J449 Chronic obstructive pulmonary disease, unspecified: Secondary | ICD-10-CM | POA: Insufficient documentation

## 2020-01-04 DIAGNOSIS — Z90710 Acquired absence of both cervix and uterus: Secondary | ICD-10-CM | POA: Insufficient documentation

## 2020-01-04 DIAGNOSIS — I10 Essential (primary) hypertension: Secondary | ICD-10-CM | POA: Insufficient documentation

## 2020-01-04 DIAGNOSIS — Z825 Family history of asthma and other chronic lower respiratory diseases: Secondary | ICD-10-CM | POA: Insufficient documentation

## 2020-01-04 DIAGNOSIS — Z20822 Contact with and (suspected) exposure to covid-19: Secondary | ICD-10-CM | POA: Diagnosis not present

## 2020-01-04 DIAGNOSIS — Z8249 Family history of ischemic heart disease and other diseases of the circulatory system: Secondary | ICD-10-CM | POA: Insufficient documentation

## 2020-01-04 DIAGNOSIS — Z87442 Personal history of urinary calculi: Secondary | ICD-10-CM | POA: Insufficient documentation

## 2020-01-04 DIAGNOSIS — K219 Gastro-esophageal reflux disease without esophagitis: Secondary | ICD-10-CM | POA: Insufficient documentation

## 2020-01-04 DIAGNOSIS — H33022 Retinal detachment with multiple breaks, left eye: Secondary | ICD-10-CM | POA: Diagnosis not present

## 2020-01-04 DIAGNOSIS — B182 Chronic viral hepatitis C: Secondary | ICD-10-CM | POA: Diagnosis not present

## 2020-01-04 HISTORY — PX: MEMBRANE PEEL: SHX5967

## 2020-01-04 HISTORY — DX: Malignant (primary) neoplasm, unspecified: C80.1

## 2020-01-04 HISTORY — PX: PHOTOCOAGULATION WITH LASER: SHX6027

## 2020-01-04 HISTORY — PX: PARS PLANA VITRECTOMY: SHX2166

## 2020-01-04 HISTORY — PX: INJECTION OF SILICONE OIL: SHX6422

## 2020-01-04 LAB — POCT I-STAT, CHEM 8
BUN: 20 mg/dL (ref 8–23)
Calcium, Ion: 1.3 mmol/L (ref 1.15–1.40)
Chloride: 105 mmol/L (ref 98–111)
Creatinine, Ser: 0.9 mg/dL (ref 0.44–1.00)
Glucose, Bld: 113 mg/dL — ABNORMAL HIGH (ref 70–99)
HCT: 39 % (ref 36.0–46.0)
Hemoglobin: 13.3 g/dL (ref 12.0–15.0)
Potassium: 4.4 mmol/L (ref 3.5–5.1)
Sodium: 144 mmol/L (ref 135–145)
TCO2: 26 mmol/L (ref 22–32)

## 2020-01-04 LAB — SARS CORONAVIRUS 2 BY RT PCR (HOSPITAL ORDER, PERFORMED IN ~~LOC~~ HOSPITAL LAB): SARS Coronavirus 2: NEGATIVE

## 2020-01-04 SURGERY — PARS PLANA VITRECTOMY WITH 25 GAUGE
Anesthesia: Monitor Anesthesia Care | Site: Eye | Laterality: Left

## 2020-01-04 MED ORDER — TETRACAINE HCL 0.5 % OP SOLN
OPHTHALMIC | Status: AC
  Filled 2020-01-04: qty 4

## 2020-01-04 MED ORDER — TOBRAMYCIN-DEXAMETHASONE 0.3-0.1 % OP OINT
TOPICAL_OINTMENT | OPHTHALMIC | Status: AC
  Filled 2020-01-04: qty 3.5

## 2020-01-04 MED ORDER — PHENYLEPHRINE HCL 2.5 % OP SOLN
1.0000 [drp] | OPHTHALMIC | Status: DC | PRN
  Administered 2020-01-04 (×3): 1 [drp] via OPHTHALMIC
  Filled 2020-01-04: qty 2

## 2020-01-04 MED ORDER — CHLORHEXIDINE GLUCONATE 0.12 % MT SOLN
15.0000 mL | Freq: Once | OROMUCOSAL | Status: AC
  Administered 2020-01-04: 15 mL via OROMUCOSAL
  Filled 2020-01-04: qty 15

## 2020-01-04 MED ORDER — LIDOCAINE HCL (PF) 2 % IJ SOLN
INTRAMUSCULAR | Status: DC | PRN
  Administered 2020-01-04: 7 mL via INTRAMUSCULAR

## 2020-01-04 MED ORDER — PROPOFOL 10 MG/ML IV BOLUS
INTRAVENOUS | Status: AC
  Filled 2020-01-04: qty 20

## 2020-01-04 MED ORDER — ORAL CARE MOUTH RINSE: 15.0000 mL | Freq: Once | OROMUCOSAL | Status: AC

## 2020-01-04 MED ORDER — DEXAMETHASONE SODIUM PHOSPHATE 10 MG/ML IJ SOLN
INTRAMUSCULAR | Status: DC | PRN
  Administered 2020-01-04: 10 mg

## 2020-01-04 MED ORDER — MIDAZOLAM HCL 2 MG/2ML IJ SOLN
INTRAMUSCULAR | Status: AC
  Filled 2020-01-04: qty 2

## 2020-01-04 MED ORDER — LACTATED RINGERS IV SOLN: INTRAVENOUS | Status: DC | PRN

## 2020-01-04 MED ORDER — EPINEPHRINE PF 1 MG/ML IJ SOLN
INTRAMUSCULAR | Status: AC
  Filled 2020-01-04: qty 1

## 2020-01-04 MED ORDER — DEXAMETHASONE SODIUM PHOSPHATE 10 MG/ML IJ SOLN
INTRAMUSCULAR | Status: AC
  Filled 2020-01-04: qty 1

## 2020-01-04 MED ORDER — PROPOFOL 10 MG/ML IV BOLUS
INTRAVENOUS | Status: DC | PRN
  Administered 2020-01-04: 10 mg via INTRAVENOUS
  Administered 2020-01-04: 30 mg via INTRAVENOUS

## 2020-01-04 MED ORDER — HYPROMELLOSE (GONIOSCOPIC) 2.5 % OP SOLN
OPHTHALMIC | Status: DC | PRN
  Administered 2020-01-04: 1 [drp] via OPHTHALMIC

## 2020-01-04 MED ORDER — HYALURONIDASE HUMAN 150 UNIT/ML IJ SOLN
INTRAMUSCULAR | Status: AC
  Filled 2020-01-04: qty 1

## 2020-01-04 MED ORDER — CEFAZOLIN SUBCONJUNCTIVAL INJECTION 100 MG/0.5 ML
INJECTION | SUBCONJUNCTIVAL | Status: DC | PRN
  Administered 2020-01-04: 100 mg via SUBCONJUNCTIVAL

## 2020-01-04 MED ORDER — BSS PLUS IO SOLN
INTRAOCULAR | Status: AC
  Filled 2020-01-04: qty 500

## 2020-01-04 MED ORDER — BRILLIANT BLUE G 0.025 % IO SOSY
0.5000 mL | PREFILLED_SYRINGE | INTRAOCULAR | Status: DC
  Filled 2020-01-04: qty 0.5

## 2020-01-04 MED ORDER — FENTANYL CITRATE (PF) 100 MCG/2ML IJ SOLN
INTRAMUSCULAR | Status: DC | PRN
  Administered 2020-01-04: 25 ug via INTRAVENOUS

## 2020-01-04 MED ORDER — HYPROMELLOSE (GONIOSCOPIC) 2.5 % OP SOLN
OPHTHALMIC | Status: AC
  Filled 2020-01-04: qty 15

## 2020-01-04 MED ORDER — BUPIVACAINE HCL (PF) 0.75 % IJ SOLN
INTRAMUSCULAR | Status: AC
  Filled 2020-01-04: qty 10

## 2020-01-04 MED ORDER — EPINEPHRINE PF 1 MG/ML IJ SOLN: INTRAOCULAR | Status: DC | PRN

## 2020-01-04 MED ORDER — TRIAMCINOLONE ACETONIDE 40 MG/ML IJ SUSP
INTRAMUSCULAR | Status: AC
  Filled 2020-01-04: qty 5

## 2020-01-04 MED ORDER — FENTANYL CITRATE (PF) 100 MCG/2ML IJ SOLN: 25.0000 ug | INTRAMUSCULAR | Status: DC | PRN

## 2020-01-04 MED ORDER — BSS IO SOLN
INTRAOCULAR | Status: AC
  Filled 2020-01-04: qty 15

## 2020-01-04 MED ORDER — ONDANSETRON HCL 4 MG/2ML IJ SOLN
INTRAMUSCULAR | Status: DC | PRN
  Administered 2020-01-04: 4 mg via INTRAVENOUS

## 2020-01-04 MED ORDER — LACTATED RINGERS IV SOLN: INTRAVENOUS | Status: DC

## 2020-01-04 MED ORDER — FENTANYL CITRATE (PF) 250 MCG/5ML IJ SOLN
INTRAMUSCULAR | Status: AC
  Filled 2020-01-04: qty 5

## 2020-01-04 MED ORDER — ATROPINE SULFATE 1 % OP SOLN
OPHTHALMIC | Status: AC
  Filled 2020-01-04: qty 5

## 2020-01-04 MED ORDER — ATROPINE SULFATE 1 % OP SOLN
1.0000 [drp] | OPHTHALMIC | Status: DC
  Administered 2020-01-04 (×3): 1 [drp] via OPHTHALMIC
  Filled 2020-01-04: qty 5

## 2020-01-04 MED ORDER — CEFAZOLIN SUBCONJUNCTIVAL INJECTION 100 MG/0.5 ML
100.0000 mg | INJECTION | SUBCONJUNCTIVAL | Status: DC
  Filled 2020-01-04: qty 5

## 2020-01-04 MED ORDER — LIDOCAINE HCL 2 % IJ SOLN
INTRAMUSCULAR | Status: AC
  Filled 2020-01-04: qty 20

## 2020-01-04 MED ORDER — LIDOCAINE HCL (CARDIAC) PF 100 MG/5ML IV SOSY
PREFILLED_SYRINGE | INTRAVENOUS | Status: DC | PRN
Start: 2020-01-04 — End: 2020-01-04
  Administered 2020-01-04: 20 mg via INTRATRACHEAL

## 2020-01-04 MED ORDER — MIDAZOLAM HCL 5 MG/5ML IJ SOLN
INTRAMUSCULAR | Status: DC | PRN
  Administered 2020-01-04: 1 mg via INTRAVENOUS

## 2020-01-04 SURGICAL SUPPLY — 69 items
ACCESSORY FRAGMATOME (MISCELLANEOUS) IMPLANT
APPLICATOR DR MATTHEWS STRL (MISCELLANEOUS) IMPLANT
BAND WRIST GAS GREEN (MISCELLANEOUS) IMPLANT
BLADE MINI 60D BLUE (BLADE) IMPLANT
BLADE MVR KNIFE 20G (BLADE) IMPLANT
BNDG EYE OVAL (GAUZE/BANDAGES/DRESSINGS) ×6 IMPLANT
CANNULA ANT CHAM MAIN (OPHTHALMIC RELATED) IMPLANT
CANNULA ANTERIOR CHAMBER 27GA (MISCELLANEOUS) IMPLANT
CANNULA DUAL BORE 23G (CANNULA) IMPLANT
CANNULA DUALBORE 25G (CANNULA) IMPLANT
CANNULA VLV SOFT TIP 25GA (OPHTHALMIC) ×3 IMPLANT
CAUTERY EYE LOW TEMP 1300F FIN (OPHTHALMIC RELATED) IMPLANT
CLOSURE STERI-STRIP 1/2X4 (GAUZE/BANDAGES/DRESSINGS) ×1
CLSR STERI-STRIP ANTIMIC 1/2X4 (GAUZE/BANDAGES/DRESSINGS) ×2 IMPLANT
CORD BIPOLAR FORCEPS 12FT (ELECTRODE) ×3 IMPLANT
COVER MAYO STAND STRL (DRAPES) IMPLANT
COVER WAND RF STERILE (DRAPES) IMPLANT
DRAPE HALF SHEET 40X57 (DRAPES) ×3 IMPLANT
DRAPE INCISE 51X51 W/FILM STRL (DRAPES) IMPLANT
DRAPE RETRACTOR (MISCELLANEOUS) ×3 IMPLANT
ERASER HMR WETFIELD 23G BP (MISCELLANEOUS) IMPLANT
FILTER BLUE MILLIPORE (MISCELLANEOUS) IMPLANT
FORCEPS ECKARDT ILM 25G SERR (OPHTHALMIC RELATED) IMPLANT
FORCEPS GRIESHABER ILM 25G A (INSTRUMENTS) IMPLANT
FORCEPS ILM 25G DSP TIP (MISCELLANEOUS) IMPLANT
GAS AUTO FILL CONSTEL (OPHTHALMIC)
GAS AUTO FILL CONSTELLATION (OPHTHALMIC) IMPLANT
GAS WRIST BAND GREEN (MISCELLANEOUS)
GLOVE BIO SURGEON STRL SZ7.5 (GLOVE) ×3 IMPLANT
GOWN STRL REUS W/ TWL LRG LVL3 (GOWN DISPOSABLE) ×2 IMPLANT
GOWN STRL REUS W/TWL LRG LVL3 (GOWN DISPOSABLE) ×4
HANDLE PNEUMATIC FOR CONSTEL (OPHTHALMIC) IMPLANT
KIT BASIN OR (CUSTOM PROCEDURE TRAY) ×3 IMPLANT
KIT PERFLUORON PROCEDURE 5ML (MISCELLANEOUS) IMPLANT
KIT TURNOVER KIT B (KITS) IMPLANT
LENS BIOM SUPER VIEW SET DISP (MISCELLANEOUS) ×3 IMPLANT
MICROPICK 25G (MISCELLANEOUS)
NEEDLE 18GX1X1/2 (RX/OR ONLY) (NEEDLE) ×3 IMPLANT
NEEDLE 25GX 5/8IN NON SAFETY (NEEDLE) ×6 IMPLANT
NEEDLE FILTER BLUNT 18X 1/2SAF (NEEDLE)
NEEDLE FILTER BLUNT 18X1 1/2 (NEEDLE) IMPLANT
NEEDLE HYPO 25GX1X1/2 BEV (NEEDLE) IMPLANT
NEEDLE HYPO 30X.5 LL (NEEDLE) ×3 IMPLANT
NEEDLE RETROBULBAR 25GX1.5 (NEEDLE) ×3 IMPLANT
NS IRRIG 1000ML POUR BTL (IV SOLUTION) ×3 IMPLANT
OIL SILICONE OPHTHALMIC 1000 (Ophthalmic Related) ×3 IMPLANT
PACK FRAGMATOME (OPHTHALMIC) IMPLANT
PAD ARMBOARD 7.5X6 YLW CONV (MISCELLANEOUS) ×6 IMPLANT
PAK PIK VITRECTOMY CVS 25GA (OPHTHALMIC) ×6 IMPLANT
PENCIL BIPOLAR 25GA STR DISP (OPHTHALMIC RELATED) IMPLANT
PICK MICROPICK 25G (MISCELLANEOUS) IMPLANT
PROBE ENDO DIATHERMY 25G (MISCELLANEOUS) ×3 IMPLANT
PROBE LASER ILLUM FLEX CVD 25G (OPHTHALMIC) ×3 IMPLANT
RETRACTOR IRIS FLEX 25G GRIESH (INSTRUMENTS) IMPLANT
ROLLS DENTAL (MISCELLANEOUS) IMPLANT
SCRAPER DIAMOND 25GA (OPHTHALMIC RELATED) IMPLANT
STOCKINETTE IMPERVIOUS 9X36 MD (GAUZE/BANDAGES/DRESSINGS) ×6 IMPLANT
STOPCOCK 4 WAY LG BORE MALE ST (IV SETS) IMPLANT
SUT ETHILON 8 0 TG100 8 (SUTURE) IMPLANT
SUT VICRYL 7 0 TG140 8 (SUTURE) IMPLANT
SUT VICRYL 8 0 TG140 8 (SUTURE) IMPLANT
SUT VICRYL ABS 6-0 S29 18IN (SUTURE) IMPLANT
SYR 10ML LL (SYRINGE) IMPLANT
SYR 20ML LL LF (SYRINGE) ×3 IMPLANT
SYR 5ML LL (SYRINGE) ×3 IMPLANT
SYR TB 1ML LUER SLIP (SYRINGE) IMPLANT
TUBE CONNECTING 12'X1/4 (SUCTIONS)
TUBE CONNECTING 12X1/4 (SUCTIONS) IMPLANT
WATER STERILE IRR 1000ML POUR (IV SOLUTION) ×3 IMPLANT

## 2020-01-04 NOTE — Discharge Instructions (Signed)
Pt and caregiver aware that patient has appointment with Dr. Baird Cancer at [facility] tomorrow 01/05/20. Pt and caregiver,Morris Snow, aware that patient must remain in rt side lying position unless eating or going to rest room. They are aware this is important for a good outcome for the surgery.

## 2020-01-04 NOTE — Anesthesia Procedure Notes (Signed)
Procedure Name: MAC Date/Time: 01/04/2020 11:30 AM Performed by: Neldon Newport, CRNA Pre-anesthesia Checklist: Patient identified, Emergency Drugs available, Suction available, Patient being monitored and Timeout performed Patient Re-evaluated:Patient Re-evaluated prior to induction Oxygen Delivery Method: Nasal cannula

## 2020-01-04 NOTE — Brief Op Note (Signed)
01/04/2020  12:53 PM  PATIENT:  [name]  74 y.o. 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  PROCEDURE:  Procedure(s): PARS PLANA VITRECTOMY WITH 25 GAUGE (Left) MEMBRANE PEEL (Left) INJECTION OF SILICONE OIL (Left) PHOTOCOAGULATION WITH LASER (Left)  SURGEON:  Surgeon(s) and Role:    Sherlynn Stalls, MD - Primary  PHYSICIAN ASSISTANT:   ASSISTANTS: none   ANESTHESIA:   MAC  EBL:  0 mL   BLOOD ADMINISTERED:none  DRAINS: none   LOCAL MEDICATIONS USED:  BUPIVICAINE   SPECIMEN:  No Specimen  DISPOSITION OF SPECIMEN:  N/A  COUNTS:  YES  TOURNIQUET:  * No tourniquets in log *  DICTATION: .Other Dictation: Dictation Number [id number]  PLAN OF CARE: Discharge home  PATIENT DISPOSITION:  PACU - hemodynamically stable.   Delay start of Pharmacological VTE agent (>24hrs) due to surgical blood loss or risk of bleeding: not applicable

## 2020-01-04 NOTE — Anesthesia Postprocedure Evaluation (Signed)
Anesthesia Post Note  Patient: [name]  Procedure(s) Performed: PARS PLANA VITRECTOMY WITH 25 GAUGE (Left Eye) MEMBRANE PEEL (Left Eye) INJECTION OF SILICONE OIL (Left Eye) PHOTOCOAGULATION WITH LASER (Left Eye)     Patient location during evaluation: PACU Anesthesia Type: MAC Level of consciousness: awake and alert Pain management: pain level controlled Vital Signs Assessment: post-procedure vital signs reviewed and stable Respiratory status: spontaneous breathing, nonlabored ventilation, respiratory function stable and patient connected to nasal cannula oxygen Cardiovascular status: stable and blood pressure returned to baseline Postop Assessment: no apparent nausea or vomiting Anesthetic complications: no   No complications documented.  Last Vitals:  Vitals:   01/04/20 1241 01/04/20 1300  BP: (!) 130/91 136/64  Pulse: 81 81  Resp: (!) 9 (!) 22  Temp: 36.7 C 36.7 C  SpO2: 92% 96%    Last Pain:  Vitals:   01/04/20 1300  TempSrc:   PainSc: 0-No pain                 [name]

## 2020-01-04 NOTE — Anesthesia Preprocedure Evaluation (Signed)
Anesthesia Evaluation  Patient identified by MRN, date of birth, ID band Patient awake    Reviewed: Allergy & Precautions, NPO status , Patient's Chart, lab work & pertinent test results  Airway Mallampati: II  TM Distance: >3 FB Neck ROM: Full    Dental  (+) Dental Advisory Given   Pulmonary COPD, Current Smoker and Patient abstained from smoking.,    breath sounds clear to auscultation       Cardiovascular hypertension, Pt. on medications  Rhythm:Regular Rate:Normal     Neuro/Psych negative neurological ROS     GI/Hepatic GERD  ,(+) Hepatitis -, C  Endo/Other  negative endocrine ROS  Renal/GU negative Renal ROS     Musculoskeletal  (+) Arthritis ,   Abdominal   Peds  Hematology negative hematology ROS (+)   Anesthesia Other Findings   Reproductive/Obstetrics                             Anesthesia Physical Anesthesia Plan  ASA: III  Anesthesia Plan: MAC   Post-op Pain Management:    Induction:   PONV Risk Score and Plan: 1 and Propofol infusion, Ondansetron and Treatment may vary due to age or medical condition  Airway Management Planned: Natural Airway and Nasal Cannula  Additional Equipment:   Intra-op Plan:   Post-operative Plan:   Informed Consent: I have reviewed the patients History and Physical, chart, labs and discussed the procedure including the risks, benefits and alternatives for the proposed anesthesia with the patient or authorized representative who has indicated his/her understanding and acceptance.       Plan Discussed with: CRNA  Anesthesia Plan Comments:         Anesthesia Quick Evaluation

## 2020-01-04 NOTE — ED Notes (Signed)
Pt. Waiting in ED lobby for surgery.

## 2020-01-04 NOTE — Op Note (Signed)
NAME: [name], [name] [facility] MEDICAL RECORD [id number] ACCOUNT [id number] DATE OF BIRTH:[birth year] FACILITY: [facility] LOCATION: [facility] PHYSICIAN:Finnleigh Marchetti Greg Cutter, MD  OPERATIVE REPORT  DATE OF PROCEDURE:  01/04/2020  SURGEON:  Sherlynn Stalls, MD   ANESTHESIA:  Local MAC with retrobulbar injection on the left eye.  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  PROCEDURE:  A pars plana vitrectomy of a complex retinal detachment with endolaser endocautery, membrane peeling and silicone oil exchange for the left eye.  COMPLICATIONS:  None.  FINDINGS:  There was a temporal break in the retina and an inferior break in the retina of the left eye.  There was early stage II to III PVR in the inferotemporal periphery.  DESCRIPTION OF PROCEDURE:  The patient was identified in the preoperative holding area.  She was then taken to the operating room where she was sedated by the anesthesia team.  The left eye was then anesthetized using a retrobulbar block consisting of a  1:1 mixture of 0.75% bupivacaine and 1% lidocaine with 150 units of Hylenex.  After excellent akinesia and anesthesia was obtained, the left eye was prepped and draped in the usual sterile fashion for ocular surgery, including trimming of the lashes.  A  Lieberman speculum was placed between the left upper and lower eyelids for exposure.  The 25-gauge trocars were used to introduce transconjunctival cannulas in the inferotemporal, superotemporal and superonasal quadrants.  The trocar was removed with the  cannulas in place.  The infusion cannula was confirmed to be in the vitreous cavity prior to its use after it was placed in the inferotemporal location.  She then underwent a core vitrectomy with the vitreous cutter and light pipe.  Vitreous was trimmed  out to the periphery.   The eye was then inspected with scleral depression.  Retinal breaks were identified in the 3 and 6 o'clock locations, with the 3 o'clock location being primarily responsible for the retinal detachment.  The location of these  retinal breaks were marked using endocautery.  A star fold was identified in the inferotemporal periphery.  This was then engaged and peeled using forceps.  This was performed without difficulty.  This allowed the retina to loosen, however, there did  seem to be some degree of subretinal fibrosis as well.  Decision was made not to approach the subretinal fibrosis as it was rather diffuse.  Therefore, a soft tip extrusion cannula was used to perform an air-fluid exchange.  Residual posterior subretinal  fluid remained, so a small retinotomy was performed in the superior posterior pole.  This was performed using endocautery.  The soft tip was used to complete the air fluid exchange at that point in time.  The retina did flatten down nicely and did not  seem to have any retraction.  Therefore, endolaser photocoagulation was placed around the retinal breaks at 3 and 6 o'clock.  Once all subretinal fluid was removed using the soft tip extrusion cannula, the laser was then used to treat around the  retinotomy as well.  Prior to the air fluid exchange though, the vitrector was used to create a small peripheral iridectomy at the 6 o'clock location.  The vitrector was then removed.  The iridectomy did not cause any bleeding.  Once this was completed,  the eye was filled with 1660 centistoke silicone oil up to the lens iris diaphragm.  No oil entered the anterior chamber.  Intraocular pressure was assessed and found to be physiologic.  The cannulas were removed from the sclerotomies.  Sclerotomies were  self-sealing and were not leaking air or oil.  Therefore, the eye was treated with subconjunctival injections of 50 mg of Ancef and 1 mg of dexamethasone.  The eye was then treated topically with  1 drop of 1% atropine and TobraDex ointment.  The  speculum was removed.  The eye was cleaned and closed and then patched and shielded.  The patient was then taken to recovery in excellent condition, having tolerated the procedure well.  VN/NUANCE  D:01/04/2020 T:01/04/2020 JOB:[id number]

## 2020-01-04 NOTE — H&P (Signed)
[name] is an 74 y.o. female.   Chief Complaint: loss of vision OS HPI: Dx with RD OS  Past Medical History:  Diagnosis Date  . Allergy   . Ankle fracture 03/16/2017   right  . Bulging lumbar disc    L4 OR L5 TAKES STEROID INJECTIONS FOR TOOK INJECTION MARCH 2019  . Cancer ([place])    Skin CA, removed  . Chronic bronchitis ([place]) FEW YEARS AGO  . Chronic hepatitis C ([place])    TOOK [place]VON[place] 2017   . Colon polyp   . COPD (chronic obstructive pulmonary disease) ([place])   . Diverticulitis 22YRS AGO  . Dyspnea    WITH HEAVY EXERTION  . Emphysema of lung ([facility])    TOOK PULMONARY FUCTION TEST AND PASSED FEW WEEKS AGO AT [facility]ILY  MD  . Gallstones   . GERD (gastroesophageal reflux disease)   . History of kidney stones 20 YRS AGO   . Hypertension   . Osteoarthritis   . Pneumonia AT BIRTH  . Spondylosis of cervical region without myelopathy or radiculopathy     Past Surgical History:  Procedure Laterality Date  . ABDOMINAL HYSTERECTOMY  1980   PARTIAL  . ANKLE ARTHROSCOPY Right 10/02/2017   Procedure: RIGHT ANKLE ARTHROSCOPY WITH EXTENSIVE DEBRIDEMENT;  Surgeon: Dorna Leitz, MD;  Location: [facility];  Service: Orthopedics;  Laterality: Right;  90 MINUTES FOR CASE  . BREAST BIOPSY  02/07/2010  . BREAST SURGERY  2011   biopsy  . CHOLECYSTECTOMY  1999   LAPAROSCOPIC  . HARDWARE REMOVAL Right 10/02/2017   Procedure: HARDWARE REMOVAL;  Surgeon: Dorna Leitz, MD;  Location: [facility];  Service: Orthopedics;  Laterality: Right;  . ORIF ANKLE FRACTURE Right 03/20/2017   Procedure: OPEN REDUCTION INTERNAL FIXATION (ORIF) ANKLE FRACTURE;  Surgeon: Dorna Leitz, MD;  Location: [facility];  Service: Orthopedics;  Laterality: Right;  . TONSILLECTOMY    . TONSILLECTOMY AND ADENOIDECTOMY  1950's    Family History  Problem Relation Age of Onset  . Heart disease Mother   . Stroke Brother   . Stroke Maternal Uncle   . COPD Paternal Uncle     Social History:  reports that she has been smoking cigarettes. She started smoking about 59 years ago. She has a 26.50 pack-year smoking history. She has never used smokeless tobacco. She reports previous alcohol use. She reports that she does not use drugs.  Allergies:  Allergies  Allergen Reactions  . Chantix [Varenicline]     Makes her feel crazy  . Codeine     Itch     Medications Prior to Admission  Medication Sig Dispense Refill  . amLODipine (NORVASC) 5 MG tablet Take 5 mg by mouth every evening.     Marland Kitchen aspirin EC 81 MG tablet Take 81 mg by mouth daily. Swallow whole.    Marland Kitchen atorvastatin (LIPITOR) 20 MG tablet Take 20 mg by mouth every evening.     Marland Kitchen estradiol (ESTRACE) 0.5 MG tablet Take 0.5 mg by mouth every evening. Once daily    . fesoterodine (TOVIAZ) 4 MG TB24 tablet Take 4 mg by mouth every evening.     . fluticasone furoate-vilanterol (BREO ELLIPTA) 200-25 MCG/INH AEPB Inhale 1 puff into the lungs daily.    . mirabegron ER (MYRBETRIQ) 50 MG TB24 tablet Take 50 mg by mouth daily.    . montelukast (SINGULAIR) 10 MG tablet Take 10 mg by mouth every evening.     Marland Kitchen  albuterol (PROVENTIL HFA;VENTOLIN HFA) 108 (90 BASE) MCG/ACT inhaler Inhale 2 puffs into the lungs every 6 (six) hours as needed for wheezing or shortness of breath.       Results for orders placed or performed during the hospital encounter of 01/04/20 (from the past 48 hour(s))  SARS Coronavirus 2 by RT PCR (hospital order, performed in [facility] hospital lab) Nasopharyngeal Nasopharyngeal Swab     Status: None   Collection Time: 01/04/20  7:38 AM   Specimen: Nasopharyngeal Swab  Result Value Ref Range   SARS Coronavirus 2 NEGATIVE NEGATIVE    Comment: (NOTE) SARS-CoV-2 target nucleic acids are NOT DETECTED.  The SARS-CoV-2 RNA is generally detectable in upper and lower respiratory specimens during the acute phase of infection. The lowest concentration of SARS-CoV-2 viral copies this assay can detect is  250 copies / mL. A negative result does not preclude SARS-CoV-2 infection and should not be used as the sole basis for treatment or other patient management decisions.  A negative result may occur with improper specimen collection / handling, submission of specimen other than nasopharyngeal swab, presence of viral mutation(s) within the areas targeted by this assay, and inadequate number of viral copies (<250 copies / mL). A negative result must be combined with clinical observations, patient history, and epidemiological information.  Fact Sheet for Patients:   StrictlyIdeas.no  Fact Sheet for Healthcare Providers: BankingDealers.co.za  This test is not yet approved or  cleared by the [place] FDA and has been authorized for detection and/or diagnosis of SARS-CoV-2 by FDA under an Emergency Use Authorization (EUA).  This EUA will remain in effect (meaning this test can be used) for the duration of the COVID-19 declaration under Section 564(b)(1) of the Act, 21 U.S.C. section 360bbb-3(b)(1), unless the authorization is terminated or revoked sooner.  Performed at [facility], [place] [address], [place], [place] [address]   I-STAT, [place] [address]     Status: Abnormal   Collection Time: 01/04/20  9:15 AM  Result Value Ref Range   Sodium 144 135 - 145 mmol/L   Potassium 4.4 3.5 - 5.1 mmol/L   Chloride 105 98 - 111 mmol/L   BUN 20 8 - 23 mg/dL   Creatinine, Ser 0.90 0.44 - 1.00 mg/dL   Glucose, Bld 113 (H) 70 - 99 mg/dL    Comment: Glucose reference range applies only to samples taken after fasting for at least 8 hours.   Calcium, Ion 1.30 1.15 - 1.40 mmol/L   TCO2 26 22 - 32 mmol/L   Hemoglobin 13.3 12.0 - 15.0 g/dL   HCT 39.0 36 - 46 %   No results found.  Review of Systems  Eyes: Positive for visual disturbance.  All other systems reviewed and are negative.   Blood pressure 136/66, pulse 77, temperature 98.3 F (36.8 C),  temperature source Oral, resp. rate 20, height 5\' 1"  (1.549 m), weight 68.9 kg, SpO2 99 %. Physical Exam Vitals and nursing note reviewed.  Constitutional:      Appearance: Normal appearance.  Eyes:     Extraocular Movements: Extraocular movements intact.     Conjunctiva/sclera: Conjunctivae normal.   Neurological:     Mental Status: She is alert.      Assessment/Plan 1. RD OS with PVR: PPV/EC/EL/Silicone oil OS/  Corliss Parish, MD 01/04/2020, 10:35 AM

## 2020-01-04 NOTE — Transfer of Care (Signed)
Immediate Anesthesia Transfer of Care Note  Patient: [name]  Procedure(s) Performed: PARS PLANA VITRECTOMY WITH 25 GAUGE (Left Eye) MEMBRANE PEEL (Left Eye) INJECTION OF SILICONE OIL (Left Eye) PHOTOCOAGULATION WITH LASER (Left Eye)  Patient Location: PACU  Anesthesia Type:MAC  Level of Consciousness: awake, alert  and oriented  Airway & Oxygen Therapy: Patient Spontanous Breathing and Patient connected to nasal cannula oxygen  Post-op Assessment: Report given to RN, Post -op Vital signs reviewed and stable and Patient moving all extremities X 4  Post vital signs: Reviewed and stable  Last Vitals:  Vitals Value Taken Time  BP 130/91 01/04/20 1240  Temp    Pulse 76 01/04/20 1241  Resp 12 01/04/20 1241  SpO2 92 % 01/04/20 1241  Vitals shown include unvalidated device data.  Last Pain:  Vitals:   01/04/20 1103  TempSrc: Oral  PainSc:          Complications: No complications documented.

## 2020-01-05 ENCOUNTER — Encounter (HOSPITAL_COMMUNITY): Payer: Self-pay | Admitting: Ophthalmology

## 2020-01-07 ENCOUNTER — Ambulatory Visit (HOSPITAL_COMMUNITY): Payer: Medicare Other

## 2020-01-28 ENCOUNTER — Other Ambulatory Visit: Payer: Self-pay

## 2020-01-28 ENCOUNTER — Ambulatory Visit (HOSPITAL_COMMUNITY)
Admission: RE | Admit: 2020-01-28 | Discharge: 2020-01-28 | Disposition: A | Discharge status: Home / Self Care | Payer: Medicare Other | Source: Ambulatory Visit | Attending: Physician Assistant | Admitting: Physician Assistant

## 2020-01-28 DIAGNOSIS — J439 Emphysema, unspecified: Secondary | ICD-10-CM | POA: Insufficient documentation

## 2020-01-28 DIAGNOSIS — Z122 Encounter for screening for malignant neoplasm of respiratory organs: Secondary | ICD-10-CM | POA: Insufficient documentation

## 2020-01-28 DIAGNOSIS — N200 Calculus of kidney: Secondary | ICD-10-CM | POA: Insufficient documentation

## 2020-01-28 DIAGNOSIS — I2510 Atherosclerotic heart disease of native coronary artery without angina pectoris: Secondary | ICD-10-CM | POA: Insufficient documentation

## 2020-01-28 DIAGNOSIS — I700 Atherosclerosis of aorta: Secondary | ICD-10-CM | POA: Insufficient documentation

## 2020-07-25 ENCOUNTER — Other Ambulatory Visit: Payer: Self-pay | Admitting: Family Medicine

## 2020-07-25 DIAGNOSIS — Z1231 Encounter for screening mammogram for malignant neoplasm of breast: Secondary | ICD-10-CM

## 2020-07-26 ENCOUNTER — Other Ambulatory Visit: Payer: Self-pay | Admitting: Family Medicine

## 2020-07-26 DIAGNOSIS — M810 Age-related osteoporosis without current pathological fracture: Secondary | ICD-10-CM

## 2020-09-06 ENCOUNTER — Ambulatory Visit
Admission: RE | Admit: 2020-09-06 | Discharge: 2020-09-06 | Disposition: A | Discharge status: Home / Self Care | Payer: Medicare Other | Source: Ambulatory Visit | Attending: Family Medicine | Admitting: Family Medicine

## 2020-09-06 ENCOUNTER — Other Ambulatory Visit: Payer: Self-pay

## 2020-09-06 DIAGNOSIS — Z1231 Encounter for screening mammogram for malignant neoplasm of breast: Secondary | ICD-10-CM

## 2020-09-09 ENCOUNTER — Other Ambulatory Visit: Payer: Self-pay | Admitting: Family Medicine

## 2020-09-09 DIAGNOSIS — R928 Other abnormal and inconclusive findings on diagnostic imaging of breast: Secondary | ICD-10-CM

## 2020-09-26 ENCOUNTER — Other Ambulatory Visit: Payer: Self-pay | Admitting: Family Medicine

## 2020-09-26 ENCOUNTER — Ambulatory Visit
Admission: RE | Admit: 2020-09-26 | Discharge: 2020-09-26 | Disposition: A | Discharge status: Home / Self Care | Payer: Medicare Other | Source: Ambulatory Visit | Attending: Family Medicine | Admitting: Family Medicine

## 2020-09-26 ENCOUNTER — Other Ambulatory Visit: Payer: Self-pay

## 2020-09-26 DIAGNOSIS — R928 Other abnormal and inconclusive findings on diagnostic imaging of breast: Secondary | ICD-10-CM

## 2020-09-27 ENCOUNTER — Other Ambulatory Visit: Payer: Self-pay

## 2020-09-27 ENCOUNTER — Ambulatory Visit
Admission: RE | Admit: 2020-09-27 | Discharge: 2020-09-27 | Disposition: A | Discharge status: Home / Self Care | Payer: Medicare Other | Source: Ambulatory Visit | Attending: Family Medicine | Admitting: Family Medicine

## 2020-09-27 DIAGNOSIS — R928 Other abnormal and inconclusive findings on diagnostic imaging of breast: Secondary | ICD-10-CM

## 2020-09-30 ENCOUNTER — Encounter: Payer: Self-pay | Admitting: *Deleted

## 2020-09-30 DIAGNOSIS — Z170 Estrogen receptor positive status [ER+]: Secondary | ICD-10-CM

## 2020-09-30 DIAGNOSIS — C50412 Malignant neoplasm of upper-outer quadrant of left female breast: Secondary | ICD-10-CM | POA: Insufficient documentation

## 2020-09-30 HISTORY — DX: Estrogen receptor positive status (ER+): Z17.0

## 2020-09-30 HISTORY — DX: Malignant neoplasm of upper-outer quadrant of left female breast: C50.412

## 2020-10-04 NOTE — Progress Notes (Signed)
[place]  Telephone:([phone]) [phone] [phone]) [phone]     ID: [name] DOB: 03/19/1946  MR#: [id number]  JKK#:[id number]  Patient Care Team: Katherina Mires, MD as PCP - General (Family Medicine) Mauro Kaufmann, RN as Oncology Nurse Navigator Rockwell Germany, RN as Oncology Nurse Navigator Coralie Keens, MD as Consulting Physician (General Surgery) Dorthy Hustead, Virgie Dad, MD as Consulting Physician (Oncology) Kyung Rudd, MD as Consulting Physician (Radiation Oncology) Sherlynn Stalls, MD as Consulting Physician (Ophthalmology) Lenna Sciara, MD as Referring Physician (Gastroenterology) Damian Leavell, MD as Referring Physician (Obstetrics and Gynecology) Harriett Sine, MD as Consulting Physician (Dermatology) Chauncey Cruel, MD OTHER MD:  CHIEF COMPLAINT: Estrogen receptor positive breast cancer  CURRENT TREATMENT: Awaiting definitive surgery   HISTORY OF CURRENT ILLNESS: [name] had routine screening mammography on 09/06/2020 showing a possible abnormality in the bilateral breasts. She underwent bilateral diagnostic mammography with tomography and bilateral breast ultrasonography at [facility] on 09/26/2020 showing: breast density category C; 1.1 cm left breast mass at 3 o'clock; benign simple cysts in upper-outer right breast; normal left axillary lymph nodes.  Accordingly on 09/27/2020 she proceeded to biopsy of the left breast area in question. The pathology from this procedure ([id number]) showed: invasive and in situ mammary carcinoma, e-cadherin positive, grade 2. Prognostic indicators significant for: estrogen receptor, 95% positive and progesterone receptor, 95% positive, both with strong staining intensity. Proliferation marker Ki67 at 5%. HER2 equivocal by immunohistochemistry (2+), but negative by fluorescent in situ hybridization with a signals ratio 1.22 and number per cell 1.95.  Cancer Staging Malignant neoplasm of  upper-outer quadrant of left breast in female, estrogen receptor positive ([place]) Staging form: Breast, AJCC 8th Edition - Clinical stage from 10/05/2020: Stage IA (cT1c, cN0, cM0, G2, ER+, PR+, HER2-) - Unsigned Stage prefix: Initial diagnosis Method of lymph node assessment: Clinical Histologic grading system: 3 grade system  The patient's subsequent history is as detailed below.   INTERVAL HISTORY: [name] was evaluated in the multidisciplinary breast cancer clinic on 10/05/2020 . Her case was also presented at the multidisciplinary breast cancer conference on the same day. At that time a preliminary plan was proposed: Lumpectomy with no sentinel lymph node sampling, no chemotherapy, consideration of adjuvant radiation, and antiestrogens  Of note, she is scheduled for repeat bone density screening on 11/29/2020.   REVIEW OF SYSTEMS: There were no specific symptoms leading to the original mammogram, which was routinely scheduled. On the provided questionnaire, Channel reports wearing glasses, change in vision, sinus problems, wearing partial dentures, dribbling urine, easy bruising, arthritis, and hot flashes. The patient denies unusual headaches, nausea, vomiting, stiff neck, dizziness, or gait imbalance. There has been no cough, phlegm production, or pleurisy, no chest pain or pressure, and no change in bowel or bladder habits. The patient denies fever, rash, bleeding, unexplained fatigue or unexplained weight loss. A detailed review of systems was otherwise entirely negative.   COVID 19 VACCINATION STATUS: fully vaccinated [employer]), with booster 04/2020   PAST MEDICAL HISTORY: Past Medical History:  Diagnosis Date  . Allergy   . Ankle fracture 03/16/2017   right  . Breast cancer ([place])   . Bulging lumbar disc    L4 OR L5 TAKES STEROID INJECTIONS FOR TOOK INJECTION MARCH 2019  . Cancer ([place])    Skin CA, removed  . Chronic bronchitis ([place]) FEW YEARS AGO  . Chronic hepatitis C ([place])    TOOK  [place] 2017   . Colon polyp   .  COPD (chronic obstructive pulmonary disease) ([place])   . Diverticulitis 21YRS AGO  . Dyspnea    WITH HEAVY EXERTION  . Emphysema of lung ([facility])    TOOK PULMONARY FUCTION TEST AND PASSED FEW WEEKS AGO AT FAMILY  MD  . Gallstones   . GERD (gastroesophageal reflux disease)   . History of kidney stones 20 YRS AGO   . Hypertension   . Osteoarthritis   . Pneumonia AT BIRTH  . Spondylosis of cervical region without myelopathy or radiculopathy     PAST SURGICAL HISTORY: Past Surgical History:  Procedure Laterality Date  . ABDOMINAL HYSTERECTOMY  1980   PARTIAL  . ANKLE ARTHROSCOPY Right 10/02/2017   Procedure: RIGHT ANKLE ARTHROSCOPY WITH EXTENSIVE DEBRIDEMENT;  Surgeon: Dorna Leitz, MD;  Location: [facility] [facility];  Service: Orthopedics;  Laterality: Right;  90 MINUTES FOR CASE  . BREAST BIOPSY  02/07/2010  . BREAST SURGERY  2011   biopsy  . CHOLECYSTECTOMY  1999   LAPAROSCOPIC  . HARDWARE REMOVAL Right 10/02/2017   Procedure: HARDWARE REMOVAL;  Surgeon: Dorna Leitz, MD;  Location: [facility];  Service: Orthopedics;  Laterality: Right;  . INJECTION OF SILICONE OIL Left 01/07/9380   Procedure: INJECTION OF SILICONE OIL;  Surgeon: Sherlynn Stalls, MD;  Location: [facility];  Service: Ophthalmology;  Laterality: Left;  . MEMBRANE PEEL Left 01/04/2020   Procedure: MEMBRANE PEEL;  Surgeon: Sherlynn Stalls, MD;  Location: [facility];  Service: Ophthalmology;  Laterality: Left;  . ORIF ANKLE FRACTURE Right 03/20/2017   Procedure: OPEN REDUCTION INTERNAL FIXATION (ORIF) ANKLE FRACTURE;  Surgeon: Dorna Leitz, MD;  Location: [place];  Service: Orthopedics;  Laterality: Right;  . PARS PLANA VITRECTOMY Left 01/04/2020   Procedure: PARS PLANA VITRECTOMY WITH 25 GAUGE;  Surgeon: Sherlynn Stalls, MD;  Location: [place];  Service: Ophthalmology;  Laterality: Left;  . PHOTOCOAGULATION WITH LASER Left 01/04/2020   Procedure: PHOTOCOAGULATION WITH  LASER;  Surgeon: Sherlynn Stalls, MD;  Location: [facility];  Service: Ophthalmology;  Laterality: Left;  . TONSILLECTOMY    . TONSILLECTOMY AND ADENOIDECTOMY  1950's    FAMILY HISTORY: Family History  Problem Relation Age of Onset  . Heart disease Mother   . Stroke Brother   . Stroke Maternal Uncle   . COPD Paternal Uncle    Her father died at age 53 from "old age." Her mother died at age 25 from [place] has one brother and one sister. She reports colon cancer in a paternal cousin (female) in her 5's. There is no family history of breast or ovarian cancer to her knowledge.   GYNECOLOGIC HISTORY:  No LMP recorded. Patient has had a hysterectomy. Menarche: 75 years old [place] P 0 LMP with hysterectomy Contraceptive: previously used without issue HRT used for 6 years  Hysterectomy? yes BSO? 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 She is divorced. She lives at home by herself, with her two cats. She is a current smoker with no plans to quit. She attends [employer].    ADVANCED DIRECTIVES: not in place; intends to name cousin Azucena Kuba as her 34. He lives in [facility] and can be reached at [phone].   HEALTH MAINTENANCE: Social History   Tobacco Use  . Smoking status: Current Every Day Smoker    Packs/day: 1.00    Years: 53.00    Pack years: 53.00    Types: Cigarettes  Start date: 07/20/1960  . Smokeless tobacco: Never Used  Vaping Use  . Vaping Use: Former  Substance Use Topics  . Alcohol use: Not Currently    Alcohol/week: 0.0 standard drinks    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  . Drug use: No     Colonoscopy: 04/2010  PAP:   Bone density: 08/2017, -1.0   Allergies  Allergen Reactions  . Chantix [Varenicline]     Makes her feel crazy  . Codeine     Itch     Current Outpatient Medications  Medication Sig Dispense Refill  . albuterol (PROVENTIL HFA;VENTOLIN HFA) 108  (90 BASE) MCG/ACT inhaler Inhale 2 puffs into the lungs every 6 (six) hours as needed for wheezing or shortness of breath.     Marland Kitchen amLODipine (NORVASC) 5 MG tablet Take 5 mg by mouth every evening.    Marland Kitchen aspirin EC 81 MG tablet Take 81 mg by mouth daily. Swallow whole.    Marland Kitchen atorvastatin (LIPITOR) 20 MG tablet Take 20 mg by mouth every evening.     Marland Kitchen estradiol (ESTRACE) 0.5 MG tablet Take 0.5 mg by mouth every evening. Once daily    . estradiol (ESTRACE) 0.5 MG tablet Take 1 tablet by mouth daily.    . fluticasone furoate-vilanterol (BREO ELLIPTA) 200-25 MCG/INH AEPB Inhale 1 puff into the lungs daily.    . mirabegron ER (MYRBETRIQ) 50 MG TB24 tablet Take 50 mg by mouth daily.    . montelukast (SINGULAIR) 10 MG tablet Take 10 mg by mouth every evening.     Marland Kitchen oxybutynin (DITROPAN-XL) 5 MG 24 hr tablet Take 5 mg by mouth daily.    . fesoterodine (TOVIAZ) 4 MG TB24 tablet Take 4 mg by mouth every evening.  (Patient not taking: Reported on 10/05/2020)    . PREVIDENT 5000 BOOSTER PLUS 1.1 % PSTE Place onto teeth daily. (Patient not taking: Reported on 10/05/2020)     No current facility-administered medications for this visit.    OBJECTIVE: White woman who appears stated age  31:   10/05/20 0859  BP: 132/60  Pulse: 79  Resp: 17  Temp: 97.6 F (36.4 C)  SpO2: 100%     Body mass index is 28.46 kg/m.   Wt Readings from Last 3 Encounters:  10/05/20 150 lb 9.6 oz (68.3 kg)  01/04/20 152 lb (68.9 kg)  10/02/17 153 lb 8 oz (69.6 kg)      ECOG FS:1 - Symptomatic but completely ambulatory  Ocular: Sclerae unicteric, pupils round and equal Ear-nose-throat: Wearing a mask Lymphatic: No cervical or supraclavicular adenopathy Lungs no rales or rhonchi Heart regular rate and rhythm Abd soft, nontender, positive bowel sounds MSK no focal spinal tenderness, no joint edema Neuro: non-focal, well-oriented, appropriate affect Breasts: The right breast is benign.  The left breast is status post recent  biopsy.  There is a moderate ecchymosis.  Both axillae are benign   LAB RESULTS:  CMP     Component Value Date/Time   NA 143 10/05/2020 0839   K 4.1 10/05/2020 0839   CL 107 10/05/2020 0839   CO2 25 10/05/2020 0839   GLUCOSE 97 10/05/2020 0839   BUN 13 10/05/2020 0839   CREATININE 0.92 10/05/2020 0839   CALCIUM 9.2 10/05/2020 0839   PROT 7.2 10/05/2020 0839   ALBUMIN 3.9 10/05/2020 0839   AST 16 10/05/2020 0839   ALT 12 10/05/2020 0839   ALKPHOS 62 10/05/2020 0839   BILITOT 0.9 10/05/2020 0839   GFRNONAA >  60 10/05/2020 0839    No results found for: TOTALPROTELP, ALBUMINELP, A1GS, A2GS, BETS, BETA2SER, GAMS, MSPIKE, SPEI  Lab Results  Component Value Date   WBC 6.7 10/05/2020   NEUTROABS 4.0 10/05/2020   HGB 13.4 10/05/2020   HCT 39.9 10/05/2020   MCV 90.7 10/05/2020   PLT 260 10/05/2020    No results found for: LABCA2  No components found for: XBLTJQ300  No results for input(s): INR in the last 168 hours.  No results found for: LABCA2  No results found for: PQZ300  No results found for: TMA263  No results found for: FHL456  No results found for: CA2729  No components found for: HGQUANT  No results found for: CEA1 / No results found for: CEA1   No results found for: AFPTUMOR  No results found for: CHROMOGRNA  No results found for: KPAFRELGTCHN, LAMBDASER, KAPLAMBRATIO (kappa/lambda light chains)  No results found for: HGBA, HGBA2QUANT, HGBFQUANT, HGBSQUAN (Hemoglobinopathy evaluation)   No results found for: LDH  No results found for: IRON, TIBC, IRONPCTSAT (Iron and TIBC)  No results found for: FERRITIN  Urinalysis No results found for: COLORURINE, APPEARANCEUR, LABSPEC, PHURINE, GLUCOSEU, HGBUR, BILIRUBINUR, KETONESUR, PROTEINUR, UROBILINOGEN, NITRITE, LEUKOCYTESUR   STUDIES: US BREAST LTD UNI LEFT INC AXILLA  Result Date: 09/26/2020 CLINICAL DATA:  75 year old female presenting as a recall from screening for possible bilateral breast  masses. EXAM: DIGITAL DIAGNOSTIC BILATERAL MAMMOGRAM WITH TOMOSYNTHESIS AND CAD; ULTRASOUND LEFT BREAST LIMITED; ULTRASOUND RIGHT BREAST LIMITED TECHNIQUE: Bilateral digital diagnostic mammography and breast tomosynthesis was performed. The images were evaluated with computer-aided detection.; Targeted ultrasound examination of the left breast was performed; Targeted ultrasound examination of the right breast was performed COMPARISON:  Previous exam(s). ACR Breast Density Category c: The breast tissue is heterogeneously dense, which may obscure small masses. FINDINGS: Mammogram: Right breast: Spot compression tomosynthesis views and full field mL tomosynthesis views of the right breast were performed. There is persistence of an oval circumscribed mass in the outer right breast measuring approximately 0.7 cm. There is a possible additional obscured mass within dense tissue in the upper outer quadrant. Left breast: Spot compression tomosynthesis views of the left breast were performed demonstrating persistence of an irregular mass in the upper outer left breast measuring approximately 0.9 cm, best seen on the cc projection. On physical exam of the left breast I do not feel a fixed discrete mass. Ultrasound: Right breast: Targeted ultrasound performed in the right breast demonstrating multiple oval circumscribed anechoic masses consistent with benign simple cysts. There is a cyst at 9 o'clock 4 cm from the nipple measuring 1.0 x 0.5 x 0.8 cm. There are additional cysts at 10 o'clock and 10:30 o'clock. No solid mass identified. These benign cysts correspond to the mammographic findings. Left breast: Targeted ultrasound performed in the left breast at 3 o'clock 7 cm from the nipple demonstrating an irregular hypoechoic mass measuring 1.0 x 0.7 x 1.1 cm. This corresponds to the mammographic abnormality. There is some associated vascularity. Targeted ultrasound of the left axilla demonstrates normal lymph nodes.  IMPRESSION: 1. Suspicious mass measuring 1.1 cm in the left breast at 3 o'clock. 2.  Benign simple cysts in the upper outer right breast. RECOMMENDATION: Ultrasound-guided core needle biopsy of the left breast mass at 3 o'clock. I have discussed the findings and recommendations with the patient who agrees to proceed with biopsy. The patient will be scheduled for the biopsy prior to leaving our office. BI-RADS CATEGORY  4: Suspicious. Electronically Signed   By:  Audie Pinto M.D.   On: 09/26/2020 12:31   US BREAST LTD UNI RIGHT INC AXILLA  Result Date: 09/26/2020 CLINICAL DATA:  75 year old female presenting as a recall from screening for possible bilateral breast masses. EXAM: DIGITAL DIAGNOSTIC BILATERAL MAMMOGRAM WITH TOMOSYNTHESIS AND CAD; ULTRASOUND LEFT BREAST LIMITED; ULTRASOUND RIGHT BREAST LIMITED TECHNIQUE: Bilateral digital diagnostic mammography and breast tomosynthesis was performed. The images were evaluated with computer-aided detection.; Targeted ultrasound examination of the left breast was performed; Targeted ultrasound examination of the right breast was performed COMPARISON:  Previous exam(s). ACR Breast Density Category c: The breast tissue is heterogeneously dense, which may obscure small masses. FINDINGS: Mammogram: Right breast: Spot compression tomosynthesis views and full field mL tomosynthesis views of the right breast were performed. There is persistence of an oval circumscribed mass in the outer right breast measuring approximately 0.7 cm. There is a possible additional obscured mass within dense tissue in the upper outer quadrant. Left breast: Spot compression tomosynthesis views of the left breast were performed demonstrating persistence of an irregular mass in the upper outer left breast measuring approximately 0.9 cm, best seen on the cc projection. On physical exam of the left breast I do not feel a fixed discrete mass. Ultrasound: Right breast: Targeted ultrasound performed  in the right breast demonstrating multiple oval circumscribed anechoic masses consistent with benign simple cysts. There is a cyst at 9 o'clock 4 cm from the nipple measuring 1.0 x 0.5 x 0.8 cm. There are additional cysts at 10 o'clock and 10:30 o'clock. No solid mass identified. These benign cysts correspond to the mammographic findings. Left breast: Targeted ultrasound performed in the left breast at 3 o'clock 7 cm from the nipple demonstrating an irregular hypoechoic mass measuring 1.0 x 0.7 x 1.1 cm. This corresponds to the mammographic abnormality. There is some associated vascularity. Targeted ultrasound of the left axilla demonstrates normal lymph nodes. IMPRESSION: 1. Suspicious mass measuring 1.1 cm in the left breast at 3 o'clock. 2.  Benign simple cysts in the upper outer right breast. RECOMMENDATION: Ultrasound-guided core needle biopsy of the left breast mass at 3 o'clock. I have discussed the findings and recommendations with the patient who agrees to proceed with biopsy. The patient will be scheduled for the biopsy prior to leaving our office. BI-RADS CATEGORY  4: Suspicious. Electronically Signed   By: Audie Pinto M.D.   On: 09/26/2020 12:31   MM DIAG BREAST TOMO BILATERAL  Result Date: 09/26/2020 CLINICAL DATA:  76 year old female presenting as a recall from screening for possible bilateral breast masses. EXAM: DIGITAL DIAGNOSTIC BILATERAL MAMMOGRAM WITH TOMOSYNTHESIS AND CAD; ULTRASOUND LEFT BREAST LIMITED; ULTRASOUND RIGHT BREAST LIMITED TECHNIQUE: Bilateral digital diagnostic mammography and breast tomosynthesis was performed. The images were evaluated with computer-aided detection.; Targeted ultrasound examination of the left breast was performed; Targeted ultrasound examination of the right breast was performed COMPARISON:  Previous exam(s). ACR Breast Density Category c: The breast tissue is heterogeneously dense, which may obscure small masses. FINDINGS: Mammogram: Right breast:  Spot compression tomosynthesis views and full field mL tomosynthesis views of the right breast were performed. There is persistence of an oval circumscribed mass in the outer right breast measuring approximately 0.7 cm. There is a possible additional obscured mass within dense tissue in the upper outer quadrant. Left breast: Spot compression tomosynthesis views of the left breast were performed demonstrating persistence of an irregular mass in the upper outer left breast measuring approximately 0.9 cm, best seen on the cc projection. On physical exam of  the left breast I do not feel a fixed discrete mass. Ultrasound: Right breast: Targeted ultrasound performed in the right breast demonstrating multiple oval circumscribed anechoic masses consistent with benign simple cysts. There is a cyst at 9 o'clock 4 cm from the nipple measuring 1.0 x 0.5 x 0.8 cm. There are additional cysts at 10 o'clock and 10:30 o'clock. No solid mass identified. These benign cysts correspond to the mammographic findings. Left breast: Targeted ultrasound performed in the left breast at 3 o'clock 7 cm from the nipple demonstrating an irregular hypoechoic mass measuring 1.0 x 0.7 x 1.1 cm. This corresponds to the mammographic abnormality. There is some associated vascularity. Targeted ultrasound of the left axilla demonstrates normal lymph nodes. IMPRESSION: 1. Suspicious mass measuring 1.1 cm in the left breast at 3 o'clock. 2.  Benign simple cysts in the upper outer right breast. RECOMMENDATION: Ultrasound-guided core needle biopsy of the left breast mass at 3 o'clock. I have discussed the findings and recommendations with the patient who agrees to proceed with biopsy. The patient will be scheduled for the biopsy prior to leaving our office. BI-RADS CATEGORY  4: Suspicious. Electronically Signed   By: Audie Pinto M.D.   On: 09/26/2020 12:31   MM 3D SCREEN BREAST BILATERAL  Result Date: 09/08/2020 CLINICAL DATA:  Screening. EXAM:  DIGITAL SCREENING BILATERAL MAMMOGRAM WITH TOMOSYNTHESIS AND CAD TECHNIQUE: Bilateral screening digital craniocaudal and mediolateral oblique mammograms were obtained. Bilateral screening digital breast tomosynthesis was performed. The images were evaluated with computer-aided detection. COMPARISON:  Previous exam(s). ACR Breast Density Category c: The breast tissue is heterogeneously dense, which may obscure small masses. FINDINGS: In the right breast , possible masses requires further evaluation. These possible masses are seen within the upper and central RIGHT breast on the MLO view, slice is 37 and 51 respectively, possible correlate within the outer RIGHT breast on cc slice 40. In the left breast , a possible mass with distortion requires further evaluation. This possible mass with distortion is seen within the outer LEFT breast, cc slice 35, possible correlate within the upper LEFT breast on the MLO view. IMPRESSION: Further evaluation is suggested for possible masses in the right breast. Further evaluation is suggested for possible mass with distortion in the left breast. RECOMMENDATION: Diagnostic mammogram and possibly ultrasound of both breasts. (Code:[id number]) The patient will be contacted regarding the findings, and additional imaging will be scheduled. BI-RADS CATEGORY  0: Incomplete. Need additional imaging evaluation and/or prior mammograms for comparison. Electronically Signed   By: Franki Cabot M.D.   On: 09/08/2020 14:41   MM CLIP PLACEMENT LEFT  Result Date: 09/27/2020 CLINICAL DATA:  Status post ultrasound-guided core needle biopsy of a mass in the lateral left breast. Evaluate post biopsy marker clip placement. EXAM: DIAGNOSTIC LEFT MAMMOGRAM POST ULTRASOUND BIOPSY COMPARISON:  Previous exam(s). FINDINGS: Mammographic images were obtained following ultrasound guided biopsy of a lateral left breast mass. The biopsy marking clip is in expected position at the site of biopsy. IMPRESSION:  Appropriate positioning of the ribbon shaped biopsy marking clip at the site of biopsy in the lateral left breast, within the small mass. Final Assessment: Post Procedure Mammograms for Marker Placement Electronically Signed   By: Lajean Manes M.D.   On: 09/27/2020 10:23   [place] LT BREAST BX W LOC DEV 1ST LESION IMG BX SPEC US GUIDE  Addendum Date: 09/28/2020   ADDENDUM REPORT: 09/28/2020 09:59 ADDENDUM: Pathology revealed GRADE II INVASIVE MAMMARY CARCINOMA, MAMMARY CARCINOMA IN-SITU of the Left breast, 3  o'clock, 7cmfn. This was found to be concordant by Dr. Lajean Manes. Pathology results were discussed with the patient by telephone. The patient reported doing well after the biopsy with tenderness at the site. Post biopsy instructions and care were reviewed and questions were answered. The patient was encouraged to call [facility] for any additional concerns. My direct phone number was provided. The patient was referred to [facility] [place] on October 05, 2020. Pathology results reported by Terie Purser, RN on 09/28/2020. Electronically Signed   By: Lajean Manes M.D.   On: 09/28/2020 09:59   Result Date: 09/28/2020 CLINICAL DATA:  Patient presents for ultrasound-guided core needle biopsy of a left breast mass. EXAM: ULTRASOUND GUIDED LEFT BREAST CORE NEEDLE BIOPSY COMPARISON:  Previous exam(s). PROCEDURE: I met with the patient and we discussed the procedure of ultrasound-guided biopsy, including benefits and alternatives. We discussed the high likelihood of a successful procedure. We discussed the risks of the procedure, including infection, bleeding, tissue injury, clip migration, and inadequate sampling. Informed written consent was given. The usual time-out protocol was performed immediately prior to the procedure. Lesion quadrant: Upper outer quadrant: Near 3 o'clock, 7 cm from nipple. Using sterile technique  and 1% Lidocaine as local anesthetic, under direct ultrasound visualization, a 12 gauge spring-loaded device was used to perform biopsy of the 1.1 cm mass using an inferior approach. At the conclusion of the procedure ribbon shaped tissue marker clip was deployed into the biopsy cavity. Follow up 2 view mammogram was performed and dictated separately. IMPRESSION: Ultrasound guided biopsy of a left breast mass. No apparent complications. Electronically Signed: By: Lajean Manes M.D. On: 09/27/2020 10:15     ELIGIBLE FOR AVAILABLE RESEARCH PROTOCOL: no  ASSESSMENT: 75 y.o. [place] woman status post left breast upper outer quadrant biopsy 09/27/2020 for a clinical T1c N0, stage IA invasive ductal carcinoma, grade 2, estrogen and progesterone receptor positive, HER-2 not amplified, with an MIB-1 of 5%  (1) definitive surgery pending  (2) consider adjuvant radiation  (3) antiestrogens  PLAN: I met today with Ambri to review her new diagnosis. Specifically we discussed the biology of her breast cancer, its diagnosis, staging, treatment  options and prognosis. We first reviewed the fact that cancer is not one disease but more than 100 different diseases and that it is important to keep them separate-- otherwise when friends and relatives discuss their own cancer experiences with Signora confusion can result. Similarly we explained that if breast cancer spreads to the bone or liver, the patient would not have bone cancer or liver cancer, but breast cancer in the bone and breast cancer in the liver: one cancer in three places-- not 3 different cancers which otherwise would have to be treated in 3 different ways.  We discussed the difference between local and systemic therapy. In terms of loco-regional treatment, lumpectomy plus radiation is equivalent to mastectomy as far as survival is concerned. For this reason, and because the cosmetic results are generally superior, we recommend breast conserving  surgery.   We then discussed the rationale for systemic therapy. There is some risk that this cancer may have already spread to other parts of her body. Patients frequently ask at this point about bone scans, CAT scans and PET scans to find out if they have occult breast cancer somewhere else. The problem is that in early stage disease we are much more likely to find false positives then  true cancers and this would expose the patient to unnecessary procedures as well as unnecessary radiation. Scans cannot answer the question the patient really would like to know, which is whether she has microscopic disease elsewhere in her body. For those reasons we do not recommend them.  Of course we would proceed to aggressive evaluation of any symptoms that might suggest metastatic disease, but that is not the case here.  Next we went over the options for systemic therapy which are anti-estrogens, anti-HER-2 immunotherapy, and chemotherapy. Kassadie does not meet criteria for anti-HER-2 immunotherapy. She is a good candidate for anti-estrogens.  The question of chemotherapy is more complicated. Chemotherapy is most effective in rapidly growing, aggressive tumors. It is much less effective in lower-grade, slow growing cancers, like Cathryne 's. For that reason given the very early stage of this tumor and the patient's age we are going to forego an Oncotype and rely on antiestrogens alone for systemic therapy  Oluwadamilola has a good understanding of the overall plan. She agrees with it. She knows the goal of treatment in her case is cure. She will call with any problems that may develop before her next visit here.  Total encounter plan 65 minutes.Sarajane Jews C. Degan Hanser, MD 10/05/2020 6:12 PM Medical Oncology and Hematology [facility] [place], [place] [address] Tel. [phone]    Fax. [phone]   This document serves as a record of services personally performed by Lurline Del, MD. It  was created on his behalf by Wilburn Mylar, a trained medical scribe. The creation of this record is based on the scribe's personal observations and the provider's statements to them.   I, Lurline Del MD, have reviewed the above documentation for accuracy and completeness, and I agree with the above.    *Total Encounter Time as defined by the Centers for Medicare and Medicaid Services includes, in addition to the face-to-face time of a patient visit (documented in the note above) non-face-to-face time: obtaining and reviewing outside history, ordering and reviewing medications, tests or procedures, care coordination (communications with other health care professionals or caregivers) and documentation in the medical record.

## 2020-10-05 ENCOUNTER — Inpatient Hospital Stay: Payer: Medicare Other

## 2020-10-05 ENCOUNTER — Encounter: Payer: Self-pay | Admitting: *Deleted

## 2020-10-05 ENCOUNTER — Inpatient Hospital Stay: Payer: Medicare Other | Attending: Oncology | Admitting: Oncology

## 2020-10-05 ENCOUNTER — Encounter: Payer: Self-pay | Admitting: Licensed Clinical Social Worker

## 2020-10-05 ENCOUNTER — Ambulatory Visit: Payer: Medicare Other | Admitting: Physical Therapy

## 2020-10-05 ENCOUNTER — Other Ambulatory Visit: Payer: Self-pay

## 2020-10-05 ENCOUNTER — Encounter: Payer: Self-pay | Admitting: Oncology

## 2020-10-05 ENCOUNTER — Ambulatory Visit
Admission: RE | Admit: 2020-10-05 | Discharge: 2020-10-05 | Disposition: A | Discharge status: Home / Self Care | Payer: Medicare Other | Source: Ambulatory Visit | Attending: Radiation Oncology | Admitting: Radiation Oncology

## 2020-10-05 ENCOUNTER — Other Ambulatory Visit: Payer: Self-pay | Admitting: Surgery

## 2020-10-05 VITALS — BP 132/60 | HR 79 | Temp 97.6°F | Resp 17 | Ht 61.0 in | Wt 150.6 lb

## 2020-10-05 DIAGNOSIS — F17210 Nicotine dependence, cigarettes, uncomplicated: Secondary | ICD-10-CM | POA: Diagnosis not present

## 2020-10-05 DIAGNOSIS — Z170 Estrogen receptor positive status [ER+]: Secondary | ICD-10-CM | POA: Insufficient documentation

## 2020-10-05 DIAGNOSIS — Z90710 Acquired absence of both cervix and uterus: Secondary | ICD-10-CM | POA: Insufficient documentation

## 2020-10-05 DIAGNOSIS — C50412 Malignant neoplasm of upper-outer quadrant of left female breast: Secondary | ICD-10-CM | POA: Insufficient documentation

## 2020-10-05 DIAGNOSIS — Z85828 Personal history of other malignant neoplasm of skin: Secondary | ICD-10-CM | POA: Diagnosis not present

## 2020-10-05 DIAGNOSIS — Z86010 Personal history of colonic polyps: Secondary | ICD-10-CM | POA: Diagnosis not present

## 2020-10-05 DIAGNOSIS — C50912 Malignant neoplasm of unspecified site of left female breast: Secondary | ICD-10-CM

## 2020-10-05 LAB — CBC WITH DIFFERENTIAL (CANCER CENTER ONLY)
Abs Immature Granulocytes: 0.01 K/uL (ref 0.00–0.07)
Basophils Absolute: 0 K/uL (ref 0.0–0.1)
Basophils Relative: 1 %
Eosinophils Absolute: 0.2 K/uL (ref 0.0–0.5)
Eosinophils Relative: 2 %
HCT: 39.9 % (ref 36.0–46.0)
Hemoglobin: 13.4 g/dL (ref 12.0–15.0)
Immature Granulocytes: 0 %
Lymphocytes Relative: 30 %
Lymphs Abs: 2 K/uL (ref 0.7–4.0)
MCH: 30.5 pg (ref 26.0–34.0)
MCHC: 33.6 g/dL (ref 30.0–36.0)
MCV: 90.7 fL (ref 80.0–100.0)
Monocytes Absolute: 0.5 K/uL (ref 0.1–1.0)
Monocytes Relative: 8 %
Neutro Abs: 4 K/uL (ref 1.7–7.7)
Neutrophils Relative %: 59 %
Platelet Count: 260 K/uL (ref 150–400)
RBC: 4.4 MIL/uL (ref 3.87–5.11)
RDW: 13.2 % (ref 11.5–15.5)
WBC Count: 6.7 K/uL (ref 4.0–10.5)
nRBC: 0 % (ref 0.0–0.2)

## 2020-10-05 LAB — CMP (CANCER CENTER ONLY)
ALT: 12 U/L (ref 0–44)
AST: 16 U/L (ref 15–41)
Albumin: 3.9 g/dL (ref 3.5–5.0)
Alkaline Phosphatase: 62 U/L (ref 38–126)
Anion gap: 11 (ref 5–15)
BUN: 13 mg/dL (ref 8–23)
CO2: 25 mmol/L (ref 22–32)
Calcium: 9.2 mg/dL (ref 8.9–10.3)
Chloride: 107 mmol/L (ref 98–111)
Creatinine: 0.92 mg/dL (ref 0.44–1.00)
GFR, Estimated: >60 mL/min (ref >60)
Glucose, Bld: 97 mg/dL (ref 70–99)
Potassium: 4.1 mmol/L (ref 3.5–5.1)
Sodium: 143 mmol/L (ref 135–145)
Total Bilirubin: 0.9 mg/dL (ref 0.3–1.2)
Total Protein: 7.2 g/dL (ref 6.5–8.1)

## 2020-10-05 LAB — GENETIC SCREENING ORDER

## 2020-10-05 NOTE — Progress Notes (Addendum)
Radiation Oncology         [phone]) [phone] ________________________________  Name: [name]        MRN: [id number]  Date of Service: 10/05/2020 DOB: 11/19/1945  CC:Bernerd Limbo, MD  Coralie Keens, MD     REFERRING PHYSICIAN: Coralie Keens, MD   DIAGNOSIS: The encounter diagnosis was Malignant neoplasm of upper-outer quadrant of left breast in female, estrogen receptor positive ([place]).   HISTORY OF PRESENT ILLNESS: [name] is a 75 y.o. female seen in the multidisciplinary breast clinic for a new diagnosis of left breast cancer. The patient was noted to have a screening detected mass in the left breast. On diagnostic imaging this measured 1 cm in the 3:00 position. 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 Her cancer was ER/PR positive, HER2 was negative with a Ki 67 of 5%. She's seen today to discuss treatment options of her cancer.    PREVIOUS RADIATION THERAPY: No   PAST MEDICAL HISTORY:  Past Medical History:  Diagnosis Date  . Allergy   . Ankle fracture 03/16/2017   right  . Bulging lumbar disc    L4 OR L5 TAKES STEROID INJECTIONS FOR TOOK INJECTION MARCH 2019  . Cancer ([place])    Skin CA, removed  . Chronic bronchitis ([place]) FEW YEARS AGO  . Chronic hepatitis C ([place])    TOOK [place]VON[place] 2017   . Colon polyp   . COPD (chronic obstructive pulmonary disease) ([place])   . Diverticulitis 79YRS AGO  . Dyspnea    WITH HEAVY EXERTION  . Emphysema of lung ([facility])    TOOK PULMONARY FUCTION TEST AND PASSED FEW WEEKS AGO AT FAMILY  MD  . Gallstones   . GERD (gastroesophageal reflux disease)   . History of kidney stones 20 YRS AGO   . Hypertension   . Osteoarthritis   . Pneumonia AT BIRTH  . Spondylosis of cervical region without myelopathy or radiculopathy        PAST SURGICAL HISTORY: Past Surgical History:  Procedure Laterality Date  . ABDOMINAL HYSTERECTOMY  1980   PARTIAL  . ANKLE ARTHROSCOPY Right 10/02/2017    Procedure: RIGHT ANKLE ARTHROSCOPY WITH EXTENSIVE DEBRIDEMENT;  Surgeon: Dorna Leitz, MD;  Location: [facility];  Service: Orthopedics;  Laterality: Right;  90 MINUTES FOR CASE  . BREAST BIOPSY  02/07/2010  . BREAST SURGERY  2011   biopsy  . CHOLECYSTECTOMY  1999   LAPAROSCOPIC  . HARDWARE REMOVAL Right 10/02/2017   Procedure: HARDWARE REMOVAL;  Surgeon: Dorna Leitz, MD;  Location: [facility];  Service: Orthopedics;  Laterality: Right;  . INJECTION OF SILICONE OIL Left 02/01/9936   Procedure: INJECTION OF SILICONE OIL;  Surgeon: Sherlynn Stalls, MD;  Location: [facility];  Service: Ophthalmology;  Laterality: Left;  . MEMBRANE PEEL Left 01/04/2020   Procedure: MEMBRANE PEEL;  Surgeon: Sherlynn Stalls, MD;  Location: [facility];  Service: Ophthalmology;  Laterality: Left;  . ORIF ANKLE FRACTURE Right 03/20/2017   Procedure: OPEN REDUCTION INTERNAL FIXATION (ORIF) ANKLE FRACTURE;  Surgeon: Dorna Leitz, MD;  Location: [place];  Service: Orthopedics;  Laterality: Right;  . PARS PLANA VITRECTOMY Left 01/04/2020   Procedure: PARS PLANA VITRECTOMY WITH 25 GAUGE;  Surgeon: Sherlynn Stalls, MD;  Location: [place];  Service: Ophthalmology;  Laterality: Left;  . PHOTOCOAGULATION WITH LASER Left 01/04/2020   Procedure: PHOTOCOAGULATION WITH LASER;  Surgeon: Sherlynn Stalls, MD;  Location: [place];  Service: Ophthalmology;  Laterality: Left;  . TONSILLECTOMY    . TONSILLECTOMY AND ADENOIDECTOMY  1950's     FAMILY HISTORY:  Family History  Problem Relation Age of Onset  . Heart disease Mother   . Stroke Brother   . Stroke Maternal Uncle   . COPD Paternal Uncle      SOCIAL HISTORY:  reports that she has been smoking cigarettes. She started smoking about 60 years ago. She has a 26.50 pack-year smoking history. She has never used smokeless tobacco. She reports previous alcohol use. She reports that she does not use drugs.The patient lives in [place]. She is single and is  very active. She enjoys riding a bike, jogging, and bowling. She had to hold off on some of these activities due to eye surgery in the past 6 months.   ALLERGIES: Chantix [varenicline] and Codeine   MEDICATIONS:  Current Outpatient Medications  Medication Sig Dispense Refill  . albuterol (PROVENTIL HFA;VENTOLIN HFA) 108 (90 BASE) MCG/ACT inhaler Inhale 2 puffs into the lungs every 6 (six) hours as needed for wheezing or shortness of breath.     Marland Kitchen amLODipine (NORVASC) 5 MG tablet Take 5 mg by mouth every evening.     Marland Kitchen aspirin EC 81 MG tablet Take 81 mg by mouth daily. Swallow whole.    Marland Kitchen atorvastatin (LIPITOR) 20 MG tablet Take 20 mg by mouth every evening.     Marland Kitchen estradiol (ESTRACE) 0.5 MG tablet Take 0.5 mg by mouth every evening. Once daily    . fesoterodine (TOVIAZ) 4 MG TB24 tablet Take 4 mg by mouth every evening.     . fluticasone furoate-vilanterol (BREO ELLIPTA) 200-25 MCG/INH AEPB Inhale 1 puff into the lungs daily.    . mirabegron ER (MYRBETRIQ) 50 MG TB24 tablet Take 50 mg by mouth daily.    . montelukast (SINGULAIR) 10 MG tablet Take 10 mg by mouth every evening.      No current facility-administered medications for this encounter.     REVIEW OF SYSTEMS: On review of systems, the patient reports that she is doing well overall. She does have hot flashes and has been using estrogen replacement but plans to discontinue this as of today. She does have some easy bruising and multifocal arthritis. She also has some urinary urgency/incontinence. No other complaints are noted.      PHYSICAL EXAM:  Wt Readings from Last 3 Encounters:  01/04/20 152 lb (68.9 kg)  10/02/17 153 lb 8 oz (69.6 kg)  03/20/17 165 lb 3.2 oz (74.9 kg)   Temp Readings from Last 3 Encounters:  01/04/20 98.1 F (36.7 C)  10/02/17 98.8 F (37.1 C)  03/21/17 99.3 F (37.4 C)   BP Readings from Last 3 Encounters:  01/04/20 136/64  10/02/17 124/60  03/21/17 123/76   Pulse Readings from Last 3  Encounters:  01/04/20 81  10/02/17 90  03/21/17 97    In general this is a well appearing caucasian female in no acute distress. She's alert and oriented x4 and appropriate throughout the examination. Cardiopulmonary assessment is negative for acute distress and she exhibits normal effort. Bilateral breast exam is deferred.    ECOG = 0  0 - Asymptomatic (Fully active, able to carry on all predisease activities without restriction)  1 - Symptomatic but completely ambulatory (Restricted in physically strenuous activity but ambulatory and able to carry out work of a light or sedentary nature. For example, light housework, office work)  2 - Symptomatic, <50% in bed during the day (Ambulatory  and capable of all self care but unable to carry out any work activities. Up and about more than 50% of waking hours)  3 - Symptomatic, >50% in bed, but not bedbound (Capable of only limited self-care, confined to bed or chair 50% or more of waking hours)  4 - Bedbound (Completely disabled. Cannot carry on any self-care. Totally confined to bed or chair)  5 - Death   Eustace Pen MM, Creech RH, Tormey DC, et al. [phone]). "Toxicity and response criteria of the [employer]". [place] Oncol. 5 (6): 649-55    LABORATORY DATA:  Lab Results  Component Value Date   HGB 13.3 01/04/2020   HCT 39.0 01/04/2020   Lab Results  Component Value Date   NA 144 01/04/2020   K 4.4 01/04/2020   CL 105 01/04/2020   No results found for: ALT, AST, GGT, ALKPHOS, BILITOT    RADIOGRAPHY: US BREAST LTD UNI LEFT INC AXILLA  Result Date: 09/26/2020 CLINICAL DATA:  75 year old female presenting as a recall from screening for possible bilateral breast masses. EXAM: DIGITAL DIAGNOSTIC BILATERAL MAMMOGRAM WITH TOMOSYNTHESIS AND CAD; ULTRASOUND LEFT BREAST LIMITED; ULTRASOUND RIGHT BREAST LIMITED TECHNIQUE: Bilateral digital diagnostic mammography and breast tomosynthesis was performed. The images were  evaluated with computer-aided detection.; Targeted ultrasound examination of the left breast was performed; Targeted ultrasound examination of the right breast was performed COMPARISON:  Previous exam(s). ACR Breast Density Category c: The breast tissue is heterogeneously dense, which may obscure small masses. FINDINGS: Mammogram: Right breast: Spot compression tomosynthesis views and full field mL tomosynthesis views of the right breast were performed. There is persistence of an oval circumscribed mass in the outer right breast measuring approximately 0.7 cm. There is a possible additional obscured mass within dense tissue in the upper outer quadrant. Left breast: Spot compression tomosynthesis views of the left breast were performed demonstrating persistence of an irregular mass in the upper outer left breast measuring approximately 0.9 cm, best seen on the cc projection. On physical exam of the left breast I do not feel a fixed discrete mass. Ultrasound: Right breast: Targeted ultrasound performed in the right breast demonstrating multiple oval circumscribed anechoic masses consistent with benign simple cysts. There is a cyst at 9 o'clock 4 cm from the nipple measuring 1.0 x 0.5 x 0.8 cm. There are additional cysts at 10 o'clock and 10:30 o'clock. No solid mass identified. These benign cysts correspond to the mammographic findings. Left breast: Targeted ultrasound performed in the left breast at 3 o'clock 7 cm from the nipple demonstrating an irregular hypoechoic mass measuring 1.0 x 0.7 x 1.1 cm. This corresponds to the mammographic abnormality. There is some associated vascularity. Targeted ultrasound of the left axilla demonstrates normal lymph nodes. IMPRESSION: 1. Suspicious mass measuring 1.1 cm in the left breast at 3 o'clock. 2.  Benign simple cysts in the upper outer right breast. RECOMMENDATION: Ultrasound-guided core needle biopsy of the left breast mass at 3 o'clock. I have discussed the findings and  recommendations with the patient who agrees to proceed with biopsy. The patient will be scheduled for the biopsy prior to leaving our office. BI-RADS CATEGORY  4: Suspicious. Electronically Signed   By: Audie Pinto M.D.   On: 09/26/2020 12:31   US BREAST LTD UNI RIGHT INC AXILLA  Result Date: 09/26/2020 CLINICAL DATA:  75 year old female presenting as a recall from screening for possible bilateral breast masses. EXAM: DIGITAL DIAGNOSTIC BILATERAL MAMMOGRAM WITH TOMOSYNTHESIS AND CAD; ULTRASOUND LEFT BREAST LIMITED;  ULTRASOUND RIGHT BREAST LIMITED TECHNIQUE: Bilateral digital diagnostic mammography and breast tomosynthesis was performed. The images were evaluated with computer-aided detection.; Targeted ultrasound examination of the left breast was performed; Targeted ultrasound examination of the right breast was performed COMPARISON:  Previous exam(s). ACR Breast Density Category c: The breast tissue is heterogeneously dense, which may obscure small masses. FINDINGS: Mammogram: Right breast: Spot compression tomosynthesis views and full field mL tomosynthesis views of the right breast were performed. There is persistence of an oval circumscribed mass in the outer right breast measuring approximately 0.7 cm. There is a possible additional obscured mass within dense tissue in the upper outer quadrant. Left breast: Spot compression tomosynthesis views of the left breast were performed demonstrating persistence of an irregular mass in the upper outer left breast measuring approximately 0.9 cm, best seen on the cc projection. On physical exam of the left breast I do not feel a fixed discrete mass. Ultrasound: Right breast: Targeted ultrasound performed in the right breast demonstrating multiple oval circumscribed anechoic masses consistent with benign simple cysts. There is a cyst at 9 o'clock 4 cm from the nipple measuring 1.0 x 0.5 x 0.8 cm. There are additional cysts at 10 o'clock and 10:30 o'clock. No  solid mass identified. These benign cysts correspond to the mammographic findings. Left breast: Targeted ultrasound performed in the left breast at 3 o'clock 7 cm from the nipple demonstrating an irregular hypoechoic mass measuring 1.0 x 0.7 x 1.1 cm. This corresponds to the mammographic abnormality. There is some associated vascularity. Targeted ultrasound of the left axilla demonstrates normal lymph nodes. IMPRESSION: 1. Suspicious mass measuring 1.1 cm in the left breast at 3 o'clock. 2.  Benign simple cysts in the upper outer right breast. RECOMMENDATION: Ultrasound-guided core needle biopsy of the left breast mass at 3 o'clock. I have discussed the findings and recommendations with the patient who agrees to proceed with biopsy. The patient will be scheduled for the biopsy prior to leaving our office. BI-RADS CATEGORY  4: Suspicious. Electronically Signed   By: Audie Pinto M.D.   On: 09/26/2020 12:31   MM DIAG BREAST TOMO BILATERAL  Result Date: 09/26/2020 CLINICAL DATA:  75 year old female presenting as a recall from screening for possible bilateral breast masses. EXAM: DIGITAL DIAGNOSTIC BILATERAL MAMMOGRAM WITH TOMOSYNTHESIS AND CAD; ULTRASOUND LEFT BREAST LIMITED; ULTRASOUND RIGHT BREAST LIMITED TECHNIQUE: Bilateral digital diagnostic mammography and breast tomosynthesis was performed. The images were evaluated with computer-aided detection.; Targeted ultrasound examination of the left breast was performed; Targeted ultrasound examination of the right breast was performed COMPARISON:  Previous exam(s). ACR Breast Density Category c: The breast tissue is heterogeneously dense, which may obscure small masses. FINDINGS: Mammogram: Right breast: Spot compression tomosynthesis views and full field mL tomosynthesis views of the right breast were performed. There is persistence of an oval circumscribed mass in the outer right breast measuring approximately 0.7 cm. There is a possible additional obscured  mass within dense tissue in the upper outer quadrant. Left breast: Spot compression tomosynthesis views of the left breast were performed demonstrating persistence of an irregular mass in the upper outer left breast measuring approximately 0.9 cm, best seen on the cc projection. On physical exam of the left breast I do not feel a fixed discrete mass. Ultrasound: Right breast: Targeted ultrasound performed in the right breast demonstrating multiple oval circumscribed anechoic masses consistent with benign simple cysts. There is a cyst at 9 o'clock 4 cm from the nipple measuring 1.0 x 0.5 x 0.8 cm.  There are additional cysts at 10 o'clock and 10:30 o'clock. No solid mass identified. These benign cysts correspond to the mammographic findings. Left breast: Targeted ultrasound performed in the left breast at 3 o'clock 7 cm from the nipple demonstrating an irregular hypoechoic mass measuring 1.0 x 0.7 x 1.1 cm. This corresponds to the mammographic abnormality. There is some associated vascularity. Targeted ultrasound of the left axilla demonstrates normal lymph nodes. IMPRESSION: 1. Suspicious mass measuring 1.1 cm in the left breast at 3 o'clock. 2.  Benign simple cysts in the upper outer right breast. RECOMMENDATION: Ultrasound-guided core needle biopsy of the left breast mass at 3 o'clock. I have discussed the findings and recommendations with the patient who agrees to proceed with biopsy. The patient will be scheduled for the biopsy prior to leaving our office. BI-RADS CATEGORY  4: Suspicious. Electronically Signed   By: Audie Pinto M.D.   On: 09/26/2020 12:31   MM 3D SCREEN BREAST BILATERAL  Result Date: 09/08/2020 CLINICAL DATA:  Screening. EXAM: DIGITAL SCREENING BILATERAL MAMMOGRAM WITH TOMOSYNTHESIS AND CAD TECHNIQUE: Bilateral screening digital craniocaudal and mediolateral oblique mammograms were obtained. Bilateral screening digital breast tomosynthesis was performed. The images were evaluated with  computer-aided detection. COMPARISON:  Previous exam(s). ACR Breast Density Category c: The breast tissue is heterogeneously dense, which may obscure small masses. FINDINGS: In the right breast , possible masses requires further evaluation. These possible masses are seen within the upper and central RIGHT breast on the MLO view, slice is 37 and 51 respectively, possible correlate within the outer RIGHT breast on cc slice 40. In the left breast , a possible mass with distortion requires further evaluation. This possible mass with distortion is seen within the outer LEFT breast, cc slice 35, possible correlate within the upper LEFT breast on the MLO view. IMPRESSION: Further evaluation is suggested for possible masses in the right breast. Further evaluation is suggested for possible mass with distortion in the left breast. RECOMMENDATION: Diagnostic mammogram and possibly ultrasound of both breasts. (Code:[id number]) The patient will be contacted regarding the findings, and additional imaging will be scheduled. BI-RADS CATEGORY  0: Incomplete. Need additional imaging evaluation and/or prior mammograms for comparison. Electronically Signed   By: Franki Cabot M.D.   On: 09/08/2020 14:41   MM CLIP PLACEMENT LEFT  Result Date: 09/27/2020 CLINICAL DATA:  Status post ultrasound-guided core needle biopsy of a mass in the lateral left breast. Evaluate post biopsy marker clip placement. EXAM: DIAGNOSTIC LEFT MAMMOGRAM POST ULTRASOUND BIOPSY COMPARISON:  Previous exam(s). FINDINGS: Mammographic images were obtained following ultrasound guided biopsy of a lateral left breast mass. The biopsy marking clip is in expected position at the site of biopsy. IMPRESSION: Appropriate positioning of the ribbon shaped biopsy marking clip at the site of biopsy in the lateral left breast, within the small mass. Final Assessment: Post Procedure Mammograms for Marker Placement Electronically Signed   By: Lajean Manes M.D.   On: 09/27/2020  10:23   [place] LT BREAST BX W LOC DEV 1ST LESION IMG BX SPEC US GUIDE  Addendum Date: 09/28/2020   ADDENDUM REPORT: 09/28/2020 09:59 ADDENDUM: Pathology revealed GRADE II INVASIVE MAMMARY CARCINOMA, MAMMARY CARCINOMA IN-SITU of the Left breast, 3 o'clock, 7cmfn. This was found to be concordant by Dr. Lajean Manes. Pathology results were discussed with the patient by telephone. The patient reported doing well after the biopsy with tenderness at the site. Post biopsy instructions and care were reviewed and questions were answered. The patient was encouraged to call  [facility] for any additional concerns. My direct phone number was provided. The patient was referred to [facility] on October 05, 2020. Pathology results reported by Terie Purser, RN on 09/28/2020. Electronically Signed   By: Lajean Manes M.D.   On: 09/28/2020 09:59   Result Date: 09/28/2020 CLINICAL DATA:  Patient presents for ultrasound-guided core needle biopsy of a left breast mass. EXAM: ULTRASOUND GUIDED LEFT BREAST CORE NEEDLE BIOPSY COMPARISON:  Previous exam(s). PROCEDURE: I met with the patient and we discussed the procedure of ultrasound-guided biopsy, including benefits and alternatives. We discussed the high likelihood of a successful procedure. We discussed the risks of the procedure, including infection, bleeding, tissue injury, clip migration, and inadequate sampling. Informed written consent was given. The usual time-out protocol was performed immediately prior to the procedure. Lesion quadrant: Upper outer quadrant: Near 3 o'clock, 7 cm from nipple. Using sterile technique and 1% Lidocaine as local anesthetic, under direct ultrasound visualization, a 12 gauge spring-loaded device was used to perform biopsy of the 1.1 cm mass using an inferior approach. At the conclusion of the procedure ribbon shaped tissue marker clip was deployed  into the biopsy cavity. Follow up 2 view mammogram was performed and dictated separately. IMPRESSION: Ultrasound guided biopsy of a left breast mass. No apparent complications. Electronically Signed: By: Lajean Manes M.D. On: 09/27/2020 10:15       IMPRESSION/PLAN: 1. Stage IA, cT1cN0M0 grade 2, ER/PR positive invasive ductal carcinoma of the left breast. Dr. Lisbeth Renshaw discusses the pathology findings and reviews the nature of left breast disease. The consensus from the breast conference includes breast conservation with lumpectomy with sentinel node biopsy. Dr. Jana Hakim does not recommend systemic therapy. We will follow up with the results of her surgical pathology to determine whether external radiotherapy to the breast would be recommended. She is aware that it's role is to reduce risks of local recurrence. Dr. Jana Hakim plans this to be followed by antiestrogen therapy. We discussed the risks, benefits, short, and long term effects of radiotherapy, as well as the curative intent. Dr. Lisbeth Renshaw discusses the delivery and logistics of radiotherapy and anticipates a course of 4 weeks of radiotherapy depending on her final surgical pathology. We will see her back a few weeks after surgery to discuss the simulation process and anticipate we starting radiotherapy about 4-6 weeks after surgery.    In a visit lasting 60 minutes, greater than 50% of the time was spent face to face reviewing her case, as well as in preparation of, discussing, and coordinating the patient's care.  The above documentation reflects my direct findings during this shared patient visit. Please see the separate note by Dr. Lisbeth Renshaw on this date for the remainder of the patient's plan of care.    Carola Rhine, [facility]    **Disclaimer: This note was dictated with voice recognition software. Similar sounding words can inadvertently be transcribed and this note may contain transcription errors which may not have been corrected upon  publication of note.**

## 2020-10-05 NOTE — Progress Notes (Signed)
[place] Work  Initial Assessment   [name] is a 75 y.o. year old female presenting alone. Clinical Social Work was referred by [facility] for assessment of psychosocial needs.   SDOH (Social Determinants of Health) assessments performed: No   Distress Screen completed: Yes ONCBCN DISTRESS SCREENING 10/05/2020  Screening Type Initial Screening  Distress experienced in past week (1-10) 3  Emotional problem type Adjusting to illness  Information Concerns Type Lack of info about diagnosis;Lack of info about treatment;Lack of info about complementary therapy choices  Physical Problem type Constipation/diarrhea      Family/Social Information:  . Housing Arrangement: patient lives alone . Transportation concerns: no  . Employment: Retired. Income source: Conservation officer, historic buildings . Financial concerns: No o Type of concern: None . Food access concerns: no . Services Currently in place:  n/a  Coping/ Adjustment to diagnosis: . Patient understands treatment plan and what happens next? yes . Concerns about diagnosis and/or treatment: I'm not especially worried about anything . Patient reported stressors: no particular stressors noted once she received more information . Current coping skills/ strengths: Capable of independent living    SUMMARY: Current SDOH Barriers:  . No significant SDOH barriers noted today  Clinical Social Work Clinical Goal(s):  Marland Kitchen No clinical SW goals at this time  Interventions: . Discussed common feeling and emotions when being diagnosed with cancer, and the importance of support during treatment . Informed patient of the support team roles and support services at [facility] . Provided CSW contact information and encouraged patient to call with any questions or concerns  Follow Up Plan: Patient will contact CSW with any support or resource needs Patient verbalizes understanding of plan: Yes    Christeen Douglas , LCSW

## 2020-10-05 NOTE — Addendum Note (Signed)
Encounter addended by: Hayden Pedro, PA-C on: 10/05/2020 10:10 AM  Actions taken: Clinical Note Signed

## 2020-10-06 ENCOUNTER — Other Ambulatory Visit: Payer: Self-pay | Admitting: Surgery

## 2020-10-06 ENCOUNTER — Telehealth: Payer: Self-pay | Admitting: *Deleted

## 2020-10-06 ENCOUNTER — Telehealth: Payer: Self-pay | Admitting: Oncology

## 2020-10-06 ENCOUNTER — Telehealth: Payer: Self-pay

## 2020-10-06 ENCOUNTER — Encounter: Payer: Self-pay | Admitting: *Deleted

## 2020-10-06 DIAGNOSIS — Z170 Estrogen receptor positive status [ER+]: Secondary | ICD-10-CM

## 2020-10-06 DIAGNOSIS — C50412 Malignant neoplasm of upper-outer quadrant of left female breast: Secondary | ICD-10-CM

## 2020-10-06 DIAGNOSIS — C50912 Malignant neoplasm of unspecified site of left female breast: Secondary | ICD-10-CM

## 2020-10-06 NOTE — Telephone Encounter (Signed)
Scheduled appt per 4/6 los. Pt aware.

## 2020-10-06 NOTE — Telephone Encounter (Signed)
Attempted to return call to pt. No answer and no voice mail.

## 2020-10-06 NOTE — Telephone Encounter (Signed)
Spoke to pt concerning [facility] from 4.6.22. Denies questions or concerns regarding dx or treatment care plan. Confirmed sx date. Informed next step after sx is xrt with Dr. Lisbeth Renshaw. Received verbal understanding. Contact information provided.

## 2020-10-12 ENCOUNTER — Encounter: Payer: Self-pay | Admitting: Dietician

## 2020-10-12 NOTE — Progress Notes (Signed)
Nutrition  Patient identified after attending Breast Clinic on 10/05/20. Patient given nutrition packet with RD contact information by nurse navigator at that time.  Chart reviewed. Patient with newly diagnosed left breast cancer. She is scheduled for left breast lumpectomy with radioactive seed localization on 4/22 with Dr. Ninfa Linden.   Ht: 5'1" Wt: 150 lb 9.6 oz BMI: 28.46  Patient is not currently at nutrition risk. Please consult RD if nutrition issues arise.  Lajuan Lines, RD, LDN Clinical Nutrition [phone] (Cell)

## 2020-10-14 NOTE — Pre-Procedure Instructions (Signed)
Surgical Instructions    Your procedure is scheduled on Friday, April 22nd.  Report to [facility] Main Entrance "A" at 5:30 A.M., then check in with the Admitting office.  Call this number if you have problems the morning of surgery:  [phone]   If you have any questions prior to your surgery date call [phone]: Open Monday-Friday 8am-4pm    Remember:  Do not eat after midnight the night before your surgery  You may drink clear liquids until 4:30 the morning of your surgery.   Clear liquids allowed are: Water, Non-Citrus Juices (without pulp), Carbonated Beverages, Clear Tea, Black Coffee Only, and Gatorade.   Patient Instructions  . The night before surgery:  o No food after midnight. ONLY clear liquids after midnight   . The day of surgery (if you do NOT have diabetes):  o Drink ONE (1) Pre-Surgery Clear Ensure by 4:30 am the morning of surgery   o This drink was given to you during your hospital  pre-op appointment visit. o Nothing else to drink after completing the  Pre-Surgery Clear Ensure.         If you have questions, please contact your surgeon's office.     Take these medicines the morning of surgery with A SIP OF WATER  mirabegron ER (MYRBETRIQ)  oxybutynin (DITROPAN-XL)  fluticasone furoate-vilanterol (BREO ELLIPTA)  Albuterol inhaler-please bring this with you on the day of surgery Nasal spray- as needed  Follow your surgeon's instructions on when to stop Aspirin.  If no instructions were given by your surgeon then you will need to call the office to get those instructions.    As of today, STOP taking any Aspirin (unless otherwise instructed by your surgeon) Aleve, Naproxen, Ibuprofen, Motrin, Advil, Goody's, BC's, all herbal medications, fish oil, and all vitamins.                     Do NOT Smoke (Tobacco/Vaping) or drink Alcohol 24 hours prior to your procedure.  If you use a CPAP at night, you may bring all equipment for your overnight stay.    Contacts, glasses, piercing's, hearing aid's, dentures or partials may not be worn into surgery, please bring cases for these belongings.    For patients admitted to the hospital, discharge time will be determined by your treatment team.   Patients discharged the day of surgery will not be allowed to drive home, and someone needs to stay with them for 24 hours.    Special instructions:   [facility]- Preparing For Surgery  Before surgery, you can play an important role. Because skin is not sterile, your skin needs to be as free of germs as possible. You can reduce the number of germs on your skin by washing with CHG (chlorahexidine gluconate) Soap before surgery.  CHG is an antiseptic cleaner which kills germs and bonds with the skin to continue killing germs even after washing.    Oral Hygiene is also important to reduce your risk of infection.  Remember - BRUSH YOUR TEETH THE MORNING OF SURGERY WITH YOUR REGULAR TOOTHPASTE  Please do not use if you have an allergy to CHG or antibacterial soaps. If your skin becomes reddened/irritated stop using the CHG.  Do not shave (including legs and underarms) for at least 48 hours prior to first CHG shower. It is OK to shave your face.  Please follow these instructions carefully.   1. Shower the NIGHT BEFORE SURGERY and the MORNING OF SURGERY  2. If you chose to wash your hair, wash your hair first as usual with your normal shampoo.  3. After you shampoo, rinse your hair and body thoroughly to remove the shampoo.  4. Use CHG Soap as you would any other liquid soap. You can apply CHG directly to the skin and wash gently with a scrungie or a clean washcloth.   5. Apply the CHG Soap to your body ONLY FROM THE NECK DOWN.  Do not use on open wounds or open sores. Avoid contact with your eyes, ears, mouth and genitals (private parts). Wash Face and genitals (private parts)  with your normal soap.   6. Wash thoroughly, paying special attention to the  area where your surgery will be performed.  7. Thoroughly rinse your body with warm water from the neck down.  8. DO NOT shower/wash with your normal soap after using and rinsing off the CHG Soap.  9. Pat yourself dry with a CLEAN TOWEL.  10. Wear CLEAN PAJAMAS to bed the night before surgery  11. Place CLEAN SHEETS on your bed the night before your surgery  12. DO NOT SLEEP WITH PETS.   Day of Surgery: Shower with CHG soap. Do not wear jewelry, make up, or nail polish Do not wear lotions, powders, perfumes, or deodorant. Do not shave 48 hours prior to surgery.   Do not bring valuables to the hospital. [facility] is not responsible for any belongings or valuables. Wear Clean/Comfortable clothing the morning of surgery Remember to brush your teeth WITH YOUR REGULAR TOOTHPASTE.   Please read over the following fact sheets that you were given.

## 2020-10-17 ENCOUNTER — Encounter (HOSPITAL_COMMUNITY): Payer: Self-pay

## 2020-10-17 ENCOUNTER — Other Ambulatory Visit: Payer: Self-pay

## 2020-10-17 ENCOUNTER — Encounter (HOSPITAL_COMMUNITY)
Admission: RE | Admit: 2020-10-17 | Discharge: 2020-10-17 | Disposition: A | Discharge status: Home / Self Care | Payer: Medicare Other | Source: Ambulatory Visit | Attending: Surgery | Admitting: Surgery

## 2020-10-17 DIAGNOSIS — Z01812 Encounter for preprocedural laboratory examination: Secondary | ICD-10-CM | POA: Diagnosis not present

## 2020-10-17 LAB — COMPREHENSIVE METABOLIC PANEL
ALT: 16 U/L (ref 0–44)
AST: 22 U/L (ref 15–41)
Albumin: 4.1 g/dL (ref 3.5–5.0)
Alkaline Phosphatase: 57 U/L (ref 38–126)
Anion gap: 10 (ref 5–15)
BUN: 15 mg/dL (ref 8–23)
CO2: 24 mmol/L (ref 22–32)
Calcium: 9.2 mg/dL (ref 8.9–10.3)
Chloride: 104 mmol/L (ref 98–111)
Creatinine, Ser: 0.99 mg/dL (ref 0.44–1.00)
GFR, Estimated: 59 mL/min — ABNORMAL LOW (ref >60)
Glucose, Bld: 114 mg/dL — ABNORMAL HIGH (ref 70–99)
Potassium: 4 mmol/L (ref 3.5–5.1)
Sodium: 138 mmol/L (ref 135–145)
Total Bilirubin: 1.2 mg/dL (ref 0.3–1.2)
Total Protein: 7.1 g/dL (ref 6.5–8.1)

## 2020-10-17 LAB — CBC
HCT: 41.7 % (ref 36.0–46.0)
Hemoglobin: 14.2 g/dL (ref 12.0–15.0)
MCH: 30.7 pg (ref 26.0–34.0)
MCHC: 34.1 g/dL (ref 30.0–36.0)
MCV: 90.1 fL (ref 80.0–100.0)
Platelets: 273 10*3/uL (ref 150–400)
RBC: 4.63 MIL/uL (ref 3.87–5.11)
RDW: 13.1 % (ref 11.5–15.5)
WBC: 8 10*3/uL (ref 4.0–10.5)
nRBC: 0 % (ref 0.0–0.2)

## 2020-10-17 NOTE — Progress Notes (Signed)
PCP - Dr. Suzanna Obey Cardiologist- Patient unable to identify cardiologist  Chest x-ray - n/a EKG - 11/05/2019- Requested from [place] on 10/17/20 Stress Test - Requested from [place] on 10/17/20 ECHO - Requested from [place] on 10/17/20 Cardiac Cath - Denies  Sleep Study - Denies  ERAS Protcol - Patient to stop clear liquids by 4:30 am day of surgery PRE-SURGERY Ensure or G2- Ensure  COVID TEST- Scheduled for 10/18/20   Anesthesia review: Yes- for pending records from [place]  Patient denies shortness of breath, fever, cough and chest pain at PAT appointment   All instructions explained to the patient, with a verbal understanding of the material. Patient agrees to go over the instructions while at home for a better understanding. Patient also instructed to self quarantine after being tested for COVID-19. The opportunity to ask questions was provided.

## 2020-10-18 ENCOUNTER — Other Ambulatory Visit (HOSPITAL_COMMUNITY)
Admission: RE | Admit: 2020-10-18 | Discharge: 2020-10-18 | Disposition: A | Discharge status: Home / Self Care | Payer: Medicare Other | Source: Ambulatory Visit | Attending: Surgery | Admitting: Surgery

## 2020-10-18 DIAGNOSIS — Z01812 Encounter for preprocedural laboratory examination: Secondary | ICD-10-CM | POA: Insufficient documentation

## 2020-10-18 DIAGNOSIS — Z20822 Contact with and (suspected) exposure to covid-19: Secondary | ICD-10-CM | POA: Diagnosis not present

## 2020-10-18 LAB — SARS CORONAVIRUS 2 (TAT 6-24 HRS): SARS Coronavirus 2: NEGATIVE

## 2020-10-18 NOTE — Progress Notes (Addendum)
Anesthesia Chart Review:  Case: [id number] Date/Time: 10/21/20 0715   Procedure: LEFT BREAST LUMPECTOMY WITH RADIOACTIVE SEED LOCALIZATION (Left Breast) - 60 MINUTES ROOM 2   Anesthesia type: General   Pre-op diagnosis: LEFT BREAST CANCER   Location: [place] OR ROOM 02 / [place] OR   Surgeons: Coralie Keens, MD      DISCUSSION: [name] scheduled for the above procedure. Appears RSL is scheduled for 10/20/20.   History includes smoking, COPD/emphysema, DOE, HTN, hepatitis C (s/p Harvoni), skin cancer, breast cancer (09/27/20 left breast biopsy: invasive mammary carcinoma, mammary carcinoma in-situ), GERD, s/p pars plana vitrectomy for complex retinal detachment left eye 01/04/20. Normal coronaries in 2014.  By notes, received [place] 3# 04/2020.  Presurgical COVID-19 test is scheduled for 10/18/20. 10/2019 EKG requested from PCP.   ADDENDUM 10/20/20 1:00 PM: Still awaiting EKG from PCP office. Called this morning and also sent another fax request. Her 10/19/19 COVID-19 test was negative.   VS: BP (!) 108/94   Pulse 84   Temp 36.8 C (Oral)   Resp 18   Ht 5\' 1"  (1.549 m)   Wt 67.6 kg   SpO2 100%   BMI 28.15 kg/m    PROVIDERS: Katherina Mires, MD is PCP  - Lurline Del, MD is HEM-ONC - Kyung Rudd, MD is RAD-ONC - Barron Schmid, MD is GI/Heaptolgoist (Hepatology Clinic, see [place]). Last office visit 06/10/20. By notes, Fibroscan in 2021 did not suggest advanced fibrosis. Since cirrhosis "has not been confirmed" he discussed [place] screening strategies. 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 Six month follow-up planned. - She is not routinely followed by cardiology, but was last evaluated at [facility] by Valetta Fuller, MD for SOB, smoking, and family history of CAD. PFTs were only "mildly abnormal" so decision to proceed with LHC (over another stress test). LHC showed normal  coronaries.   LABS: Labs reviewed: Acceptable for surgery. (all labs ordered are listed, but only abnormal results are displayed)  Labs Reviewed  COMPREHENSIVE METABOLIC PANEL - Abnormal; Notable for the following components:      Result Value   Glucose, Bld 114 (*)    GFR, Estimated 59 (*)    All other components within normal limits  CBC    IMAGES: CT Chest Lung cancer screening 01/28/20: IMPRESSION: 1. Lung-RADS 2, benign appearance or behavior. Continue annual screening with low-dose chest CT without contrast in 12 months. 2. Left main and 3 vessel coronary artery calcifications. 3. Left kidney stones. - Aortic Atherosclerosis (ICD10-I70.0) and Emphysema (ICD10-J43.9).   EKG: 11/02/19 ([place]):    CV: Cardiac cath 06/10/13 ([facility], done fodr SOB): CONCLUSIONS:  1. Normal coronary arteries   By 06/29/11 office note by Rachell Cipro, MD ([facility]) 06/18/11 "stress echocardiogram which was normal."   Past Medical History:  Diagnosis Date  . Allergy   . Ankle fracture 03/16/2017   right  . Breast cancer ([place])   . Bulging lumbar disc    L4 OR L5 TAKES STEROID INJECTIONS FOR TOOK INJECTION MARCH 2019  . Cancer ([place])    Skin CA, removed  . Chronic bronchitis ([place]) FEW YEARS AGO  . Chronic hepatitis C ([place])    TOOK HARVONI [place] 2017   . Colon polyp   . COPD (chronic obstructive pulmonary disease) ([place])   . Diverticulitis 10YRS AGO  . Dyspnea    WITH HEAVY EXERTION  . Emphysema of  lung ([facility])    TOOK PULMONARY FUCTION TEST AND PASSED FEW WEEKS AGO AT FAMILY  MD  . Gallstones   . GERD (gastroesophageal reflux disease)   . History of kidney stones 20 YRS AGO   . Hypertension   . Osteoarthritis   . Pneumonia AT BIRTH  . Spondylosis of cervical region without myelopathy or radiculopathy     Past Surgical History:  Procedure Laterality Date  . ABDOMINAL HYSTERECTOMY  1980   PARTIAL  . ANKLE ARTHROSCOPY Right 10/02/2017    Procedure: RIGHT ANKLE ARTHROSCOPY WITH EXTENSIVE DEBRIDEMENT;  Surgeon: Dorna Leitz, MD;  Location: [place];  Service: Orthopedics;  Laterality: Right;  90 MINUTES FOR CASE  . BREAST BIOPSY  02/07/2010  . BREAST SURGERY  2011   biopsy  . CHOLECYSTECTOMY  1999   LAPAROSCOPIC  . HARDWARE REMOVAL Right 10/02/2017   Procedure: HARDWARE REMOVAL;  Surgeon: Dorna Leitz, MD;  Location: [facility];  Service: Orthopedics;  Laterality: Right;  . INJECTION OF SILICONE OIL Left 11/04/2128   Procedure: INJECTION OF SILICONE OIL;  Surgeon: Sherlynn Stalls, MD;  Location: [place];  Service: Ophthalmology;  Laterality: Left;  . MEMBRANE PEEL Left 01/04/2020   Procedure: MEMBRANE PEEL;  Surgeon: Sherlynn Stalls, MD;  Location: [place];  Service: Ophthalmology;  Laterality: Left;  . ORIF ANKLE FRACTURE Right 03/20/2017   Procedure: OPEN REDUCTION INTERNAL FIXATION (ORIF) ANKLE FRACTURE;  Surgeon: Dorna Leitz, MD;  Location: [place];  Service: Orthopedics;  Laterality: Right;  . PARS PLANA VITRECTOMY Left 01/04/2020   Procedure: PARS PLANA VITRECTOMY WITH 25 GAUGE;  Surgeon: Sherlynn Stalls, MD;  Location: [place];  Service: Ophthalmology;  Laterality: Left;  . PHOTOCOAGULATION WITH LASER Left 01/04/2020   Procedure: PHOTOCOAGULATION WITH LASER;  Surgeon: Sherlynn Stalls, MD;  Location: [place];  Service: Ophthalmology;  Laterality: Left;  . TONSILLECTOMY    . TONSILLECTOMY AND ADENOIDECTOMY  1950's    MEDICATIONS: . albuterol (PROVENTIL HFA;VENTOLIN HFA) 108 (90 BASE) MCG/ACT inhaler  . amLODipine (NORVASC) 5 MG tablet  . aspirin EC 81 MG tablet  . atorvastatin (LIPITOR) 20 MG tablet  . cholecalciferol (VITAMIN D3) 25 MCG (1000 UNIT) tablet  . fluticasone furoate-vilanterol (BREO ELLIPTA) 200-25 MCG/INH AEPB  . ipratropium (ATROVENT) 0.06 % nasal spray  . mirabegron ER (MYRBETRIQ) 50 MG TB24 tablet  . montelukast (SINGULAIR) 10 MG tablet  . oxybutynin (DITROPAN-XL) 5 MG  24 hr tablet  . PREVIDENT 5000 BOOSTER PLUS 1.1 % PSTE   No current facility-administered medications for this encounter.    Myra Gianotti, PA-C Surgical Short Stay/Anesthesiology [facility] Phone [phone] [facility] Phone [phone] 10/18/2020 3:08 PM

## 2020-10-20 ENCOUNTER — Ambulatory Visit
Admission: RE | Admit: 2020-10-20 | Discharge: 2020-10-20 | Disposition: A | Discharge status: Home / Self Care | Payer: Medicare Other | Source: Ambulatory Visit | Attending: Surgery | Admitting: Surgery

## 2020-10-20 ENCOUNTER — Other Ambulatory Visit: Payer: Self-pay

## 2020-10-20 DIAGNOSIS — C50912 Malignant neoplasm of unspecified site of left female breast: Secondary | ICD-10-CM

## 2020-10-20 NOTE — Anesthesia Preprocedure Evaluation (Addendum)
Anesthesia Evaluation  Patient identified by MRN, date of birth, ID band Patient awake    Reviewed: Allergy & Precautions, NPO status , Patient's Chart, lab work & pertinent test results  Airway Mallampati: II  TM Distance: >3 FB Neck ROM: Full    Dental  (+) Dental Advisory Given   Pulmonary COPD, Current Smoker and Patient abstained from smoking.,    breath sounds clear to auscultation       Cardiovascular hypertension, Pt. on medications  Rhythm:Regular Rate:Normal     Neuro/Psych PSYCHIATRIC DISORDERS Anxiety Depression negative neurological ROS     GI/Hepatic GERD  ,(+) Hepatitis -, C  Endo/Other  negative endocrine ROS  Renal/GU negative Renal ROS  negative genitourinary   Musculoskeletal  (+) Arthritis ,   Abdominal   Peds negative pediatric ROS (+)  Hematology negative hematology ROS (+)   Anesthesia Other Findings   Reproductive/Obstetrics negative OB ROS                            Anesthesia Physical  Anesthesia Plan  ASA: III  Anesthesia Plan: General   Post-op Pain Management:    Induction: Intravenous  PONV Risk Score and Plan: 2 and Ondansetron and Dexamethasone  Airway Management Planned: LMA  Additional Equipment: None  Intra-op Plan:   Post-operative Plan: Extubation in OR  Informed Consent: I have reviewed the patients History and Physical, chart, labs and discussed the procedure including the risks, benefits and alternatives for the proposed anesthesia with the patient or authorized representative who has indicated his/her understanding and acceptance.       Plan Discussed with: CRNA and Anesthesiologist  Anesthesia Plan Comments:        Anesthesia Quick Evaluation

## 2020-10-20 NOTE — H&P (Signed)
[name] Appointment: 10/05/2020 9:00 AM Location: [facility] Patient #: [id number] DOB: June 06, 1946 Undefined / Language: Cleophus Molt / Race: White Female   History of Present Illness (Mars Scheaffer A. Ninfa Linden MD; 10/05/2020 10:40 AM) The patient is a 75 year old female who presents with breast cancer. Chief complaint: Left breast cancer  This is a 75 year old female who was found small mass in left breast on screening mammography. She underwent a biopsy of the mass showing both invasive and in situ ductal carcinoma. The size of the mass measured 1.1 cm. the ultrasound was negative. she has no previous problems regarding her breast. she denies nipple discharge. she is otherwise healthy but smokes regularly. there is no family history of breast cancer.    Past Surgical History Conni Slipper, RN; 10/05/2020 8:02 AM) Colon Polyp Removal - Colonoscopy  Gallbladder Surgery - Laparoscopic  Hysterectomy (not due to cancer) - Partial   Diagnostic Studies History Conni Slipper, RN; 10/05/2020 8:02 AM) Colonoscopy  5-10 years ago Mammogram  within last year Pap Smear  >5 years ago  Medication History Conni Slipper, RN; 10/05/2020 8:01 AM) Medications Reconciled  Social History Conni Slipper, RN; 10/05/2020 8:02 AM) Alcohol use  Remotely quit alcohol use. Caffeine use  Carbonated beverages, Coffee, Tea. No drug use  Tobacco use  Current every day smoker.  Family History Conni Slipper, RN; 10/05/2020 8:02 AM) Cerebrovascular Accident  Brother. Heart Disease  Mother. Heart disease in female family member before age 63   Pregnancy / Birth History Conni Slipper, RN; 10/05/2020 8:02 AM) Age at menarche  57 years. Age of menopause  <44 Gravida  2 Irregular periods  Maternal age  41-25 Para  0  Other Problems Conni Slipper, RN; 10/05/2020 8:02 AM) Asthma  Chronic Obstructive Lung Disease  Emphysema Of Lung  Hepatitis  High blood pressure  Melanoma     Review of  Systems Conni Slipper RN; 10/05/2020 8:02 AM) General Not Present- Appetite Loss, Chills, Fatigue, Fever, Night Sweats, Weight Gain and Weight Loss. Skin Not Present- Change in Wart/Mole, Dryness, Hives, Jaundice, New Lesions, Non-Healing Wounds, Rash and Ulcer. HEENT Not Present- Earache, Hearing Loss, Hoarseness, Nose Bleed, Oral Ulcers, Ringing in the Ears, Seasonal Allergies, Sinus Pain, Sore Throat, Visual Disturbances, Wears glasses/contact lenses and Yellow Eyes. Respiratory Not Present- Bloody sputum, Chronic Cough, Difficulty Breathing, Snoring and Wheezing. Breast Not Present- Breast Mass, Breast Pain, Nipple Discharge and Skin Changes. Cardiovascular Not Present- Chest Pain, Difficulty Breathing Lying Down, Leg Cramps, Palpitations, Rapid Heart Rate, Shortness of Breath and Swelling of Extremities. Gastrointestinal Not Present- Abdominal Pain, Bloating, Bloody Stool, Change in Bowel Habits, Chronic diarrhea, Constipation, Difficulty Swallowing, Excessive gas, Gets full quickly at meals, Hemorrhoids, Indigestion, Nausea, Rectal Pain and Vomiting. Female Genitourinary Not Present- Frequency, Nocturia, Painful Urination, Pelvic Pain and Urgency. Musculoskeletal Not Present- Back Pain, Joint Pain, Joint Stiffness, Muscle Pain, Muscle Weakness and Swelling of Extremities. Neurological Not Present- Decreased Memory, Fainting, Headaches, Numbness, Seizures, Tingling, Tremor, Trouble walking and Weakness. Psychiatric Not Present- Anxiety, Bipolar, Change in Sleep Pattern, Depression, Fearful and Frequent crying. Endocrine Not Present- Cold Intolerance, Excessive Hunger, Hair Changes, Heat Intolerance, Hot flashes and New Diabetes. Hematology Not Present- Blood Thinners, Easy Bruising, Excessive bleeding, Gland problems, HIV and Persistent Infections.   Physical Exam (Yilia Sacca A. Ninfa Linden MD; 10/05/2020 10:41 AM) The physical exam findings are as follows: Note: She appears well on exam  Breast exam,  there is ecchymosis and a small hematoma at the biopsy site of the breast. There  is no axillary adenopathy and no other abnormalities. The nipple areolar complex is normal.  Lungs clear CV RRR Abdomen soft, NT    Assessment & Plan (Tamya Denardo A. Ninfa Linden MD; 10/05/2020 10:43 AM) INVASIVE DUCTAL CARCINOMA OF BREAST, LEFT (C50.912) Impression: I have reviewed her notes in the electronic medical records. I reviewed labs, ultrasound area I also discussed her in the multidisciplinary breast cancer conference this morning. She has a slightly invasive ductal carcinoma of the left breast. We have discussed this with her in detail. She has a candidate for breast conservation discussed in detail. We also discussed mastectomy. She wishes to proceed with breast conservation. I next discussed proceeding with a radioactive seed guided left breast lumpectomy. I explained the procedure in detail. Discussed risks which includes but not limited to bleeding infection, the need for further surgery until positive, injury to surrounding structures, cardiopulmonary issues, etc. We discussed postoperative recovery. She understands and wishes to proceed with surgery which will be scheduled

## 2020-10-21 ENCOUNTER — Ambulatory Visit (HOSPITAL_COMMUNITY): Payer: Medicare Other | Admitting: Vascular Surgery

## 2020-10-21 ENCOUNTER — Ambulatory Visit (HOSPITAL_COMMUNITY): Payer: Medicare Other | Admitting: Anesthesiology

## 2020-10-21 ENCOUNTER — Ambulatory Visit
Admission: RE | Admit: 2020-10-21 | Discharge: 2020-10-21 | Disposition: A | Discharge status: Home / Self Care | Payer: Medicare Other | Source: Ambulatory Visit | Attending: Surgery | Admitting: Surgery

## 2020-10-21 ENCOUNTER — Ambulatory Visit (HOSPITAL_COMMUNITY)
Admission: RE | Admit: 2020-10-21 | Discharge: 2020-10-21 | Disposition: A | Discharge status: Home / Self Care | Payer: Medicare Other | Attending: Surgery | Admitting: Surgery

## 2020-10-21 ENCOUNTER — Other Ambulatory Visit: Payer: Self-pay

## 2020-10-21 ENCOUNTER — Encounter (HOSPITAL_COMMUNITY): Payer: Self-pay | Admitting: Surgery

## 2020-10-21 ENCOUNTER — Encounter (HOSPITAL_COMMUNITY)
Admission: RE | Disposition: A | Discharge status: Home / Self Care | Payer: Self-pay | Source: Home / Self Care | Attending: Surgery

## 2020-10-21 DIAGNOSIS — Z170 Estrogen receptor positive status [ER+]: Secondary | ICD-10-CM | POA: Insufficient documentation

## 2020-10-21 DIAGNOSIS — F17200 Nicotine dependence, unspecified, uncomplicated: Secondary | ICD-10-CM | POA: Diagnosis not present

## 2020-10-21 DIAGNOSIS — Z85820 Personal history of malignant melanoma of skin: Secondary | ICD-10-CM | POA: Diagnosis not present

## 2020-10-21 DIAGNOSIS — Z79899 Other long term (current) drug therapy: Secondary | ICD-10-CM | POA: Diagnosis not present

## 2020-10-21 DIAGNOSIS — C50812 Malignant neoplasm of overlapping sites of left female breast: Secondary | ICD-10-CM | POA: Insufficient documentation

## 2020-10-21 DIAGNOSIS — C50912 Malignant neoplasm of unspecified site of left female breast: Secondary | ICD-10-CM

## 2020-10-21 DIAGNOSIS — N6489 Other specified disorders of breast: Secondary | ICD-10-CM | POA: Diagnosis not present

## 2020-10-21 HISTORY — PX: BREAST LUMPECTOMY WITH RADIOACTIVE SEED LOCALIZATION: SHX6424

## 2020-10-21 SURGERY — BREAST LUMPECTOMY WITH RADIOACTIVE SEED LOCALIZATION
Anesthesia: General | Site: Breast | Laterality: Left

## 2020-10-21 MED ORDER — CHLORHEXIDINE GLUCONATE 0.12 % MT SOLN
OROMUCOSAL | Status: AC
  Administered 2020-10-21: 15 mL via OROMUCOSAL
  Filled 2020-10-21: qty 15

## 2020-10-21 MED ORDER — ONDANSETRON HCL 4 MG/2ML IJ SOLN
INTRAMUSCULAR | Status: DC | PRN
  Administered 2020-10-21: 4 mg via INTRAVENOUS

## 2020-10-21 MED ORDER — ENSURE PRE-SURGERY PO LIQD: 296.0000 mL | Freq: Once | ORAL | Status: DC

## 2020-10-21 MED ORDER — ONDANSETRON HCL 4 MG/2ML IJ SOLN: 4.0000 mg | Freq: Once | INTRAMUSCULAR | Status: DC | PRN

## 2020-10-21 MED ORDER — BUPIVACAINE HCL (PF) 0.25 % IJ SOLN
INTRAMUSCULAR | Status: AC
  Filled 2020-10-21: qty 30

## 2020-10-21 MED ORDER — FENTANYL CITRATE (PF) 100 MCG/2ML IJ SOLN
INTRAMUSCULAR | Status: AC
  Filled 2020-10-21: qty 2

## 2020-10-21 MED ORDER — LACTATED RINGERS IV SOLN: INTRAVENOUS | Status: DC

## 2020-10-21 MED ORDER — CEFAZOLIN SODIUM-DEXTROSE 2-4 GM/100ML-% IV SOLN
INTRAVENOUS | Status: AC
  Filled 2020-10-21: qty 100

## 2020-10-21 MED ORDER — PHENYLEPHRINE 40 MCG/ML (10ML) SYRINGE FOR IV PUSH (FOR BLOOD PRESSURE SUPPORT)
PREFILLED_SYRINGE | INTRAVENOUS | Status: DC | PRN
  Administered 2020-10-21 (×2): 120 ug via INTRAVENOUS

## 2020-10-21 MED ORDER — BUPIVACAINE-EPINEPHRINE 0.25% -1:200000 IJ SOLN
INTRAMUSCULAR | Status: DC | PRN
  Administered 2020-10-21: 20 mL

## 2020-10-21 MED ORDER — CEFAZOLIN SODIUM-DEXTROSE 2-4 GM/100ML-% IV SOLN
2.0000 g | INTRAVENOUS | Status: AC
  Administered 2020-10-21: 2 g via INTRAVENOUS

## 2020-10-21 MED ORDER — DEXAMETHASONE SODIUM PHOSPHATE 10 MG/ML IJ SOLN
INTRAMUSCULAR | Status: DC | PRN
  Administered 2020-10-21: 5 mg via INTRAVENOUS

## 2020-10-21 MED ORDER — PROPOFOL 10 MG/ML IV BOLUS
INTRAVENOUS | Status: AC
  Filled 2020-10-21: qty 20

## 2020-10-21 MED ORDER — ACETAMINOPHEN 500 MG PO TABS
ORAL_TABLET | ORAL | Status: AC
  Administered 2020-10-21: 1000 mg via ORAL
  Filled 2020-10-21: qty 2

## 2020-10-21 MED ORDER — CHLORHEXIDINE GLUCONATE CLOTH 2 % EX PADS: 6.0000 | MEDICATED_PAD | Freq: Once | CUTANEOUS | Status: DC

## 2020-10-21 MED ORDER — FENTANYL CITRATE (PF) 250 MCG/5ML IJ SOLN
INTRAMUSCULAR | Status: AC
  Filled 2020-10-21: qty 5

## 2020-10-21 MED ORDER — FENTANYL CITRATE (PF) 250 MCG/5ML IJ SOLN
INTRAMUSCULAR | Status: DC | PRN
  Administered 2020-10-21 (×3): 25 ug via INTRAVENOUS

## 2020-10-21 MED ORDER — AMISULPRIDE (ANTIEMETIC) 5 MG/2ML IV SOLN: 10.0000 mg | Freq: Once | INTRAVENOUS | Status: DC | PRN

## 2020-10-21 MED ORDER — LIDOCAINE 2% (20 MG/ML) 5 ML SYRINGE
INTRAMUSCULAR | Status: DC | PRN
  Administered 2020-10-21: 80 mg via INTRAVENOUS

## 2020-10-21 MED ORDER — ACETAMINOPHEN 500 MG PO TABS: 1000.0000 mg | ORAL_TABLET | ORAL | Status: AC

## 2020-10-21 MED ORDER — CHLORHEXIDINE GLUCONATE 0.12 % MT SOLN: 15.0000 mL | Freq: Once | OROMUCOSAL | Status: AC

## 2020-10-21 MED ORDER — ACETAMINOPHEN 500 MG PO TABS: 1000.0000 mg | ORAL_TABLET | Freq: Once | ORAL | Status: DC

## 2020-10-21 MED ORDER — ORAL CARE MOUTH RINSE: 15.0000 mL | Freq: Once | OROMUCOSAL | Status: AC

## 2020-10-21 MED ORDER — TRAMADOL HCL 50 MG PO TABS: 50.0000 mg | ORAL_TABLET | Freq: Four times a day (QID) | ORAL | 0 refills | Status: DC | PRN

## 2020-10-21 MED ORDER — FENTANYL CITRATE (PF) 100 MCG/2ML IJ SOLN
25.0000 ug | INTRAMUSCULAR | Status: DC | PRN
  Administered 2020-10-21: 25 ug via INTRAVENOUS

## 2020-10-21 MED ORDER — 0.9 % SODIUM CHLORIDE (POUR BTL) OPTIME
TOPICAL | Status: DC | PRN
  Administered 2020-10-21: 1000 mL

## 2020-10-21 MED ORDER — PROPOFOL 10 MG/ML IV BOLUS
INTRAVENOUS | Status: DC | PRN
  Administered 2020-10-21: 150 mg via INTRAVENOUS

## 2020-10-21 SURGICAL SUPPLY — 33 items
APPLIER CLIP 9.375 MED OPEN (MISCELLANEOUS) ×2
BINDER BREAST LRG (GAUZE/BANDAGES/DRESSINGS) ×2 IMPLANT
BINDER BREAST XLRG (GAUZE/BANDAGES/DRESSINGS) IMPLANT
CANISTER SUCT 3000ML PPV (MISCELLANEOUS) ×2 IMPLANT
CHLORAPREP W/TINT 26 (MISCELLANEOUS) ×2 IMPLANT
CLIP APPLIE 9.375 MED OPEN (MISCELLANEOUS) ×1 IMPLANT
COVER PROBE W GEL 5X96 (DRAPES) ×2 IMPLANT
COVER SURGICAL LIGHT HANDLE (MISCELLANEOUS) ×2 IMPLANT
COVER WAND RF STERILE (DRAPES) ×2 IMPLANT
DERMABOND ADVANCED (GAUZE/BANDAGES/DRESSINGS) ×1
DERMABOND ADVANCED .7 DNX12 (GAUZE/BANDAGES/DRESSINGS) ×1 IMPLANT
DEVICE DUBIN SPECIMEN MAMMOGRA (MISCELLANEOUS) ×2 IMPLANT
DRAPE CHEST BREAST 15X10 FENES (DRAPES) ×2 IMPLANT
DRSG PAD ABDOMINAL 8X10 ST (GAUZE/BANDAGES/DRESSINGS) ×2 IMPLANT
ELECT CAUTERY BLADE 6.4 (BLADE) ×2 IMPLANT
ELECT REM PT RETURN 9FT ADLT (ELECTROSURGICAL) ×2
ELECTRODE REM PT RTRN 9FT ADLT (ELECTROSURGICAL) ×1 IMPLANT
GAUZE SPONGE 4X4 12PLY STRL (GAUZE/BANDAGES/DRESSINGS) ×2 IMPLANT
GLOVE SURG SIGNA 7.5 PF LTX (GLOVE) ×2 IMPLANT
GOWN STRL REUS W/ TWL LRG LVL3 (GOWN DISPOSABLE) ×1 IMPLANT
GOWN STRL REUS W/ TWL XL LVL3 (GOWN DISPOSABLE) ×1 IMPLANT
GOWN STRL REUS W/TWL LRG LVL3 (GOWN DISPOSABLE) ×1
GOWN STRL REUS W/TWL XL LVL3 (GOWN DISPOSABLE) ×1
KIT BASIN OR (CUSTOM PROCEDURE TRAY) ×2 IMPLANT
KIT MARKER MARGIN INK (KITS) ×2 IMPLANT
NEEDLE HYPO 25GX1X1/2 BEV (NEEDLE) ×2 IMPLANT
NS IRRIG 1000ML POUR BTL (IV SOLUTION) IMPLANT
PACK GENERAL/GYN (CUSTOM PROCEDURE TRAY) ×2 IMPLANT
SUT MNCRL AB 4-0 PS2 18 (SUTURE) ×2 IMPLANT
SUT VIC AB 3-0 SH 18 (SUTURE) ×2 IMPLANT
SYR CONTROL 10ML LL (SYRINGE) ×2 IMPLANT
TOWEL GREEN STERILE (TOWEL DISPOSABLE) ×2 IMPLANT
TOWEL GREEN STERILE FF (TOWEL DISPOSABLE) ×2 IMPLANT

## 2020-10-21 NOTE — Transfer of Care (Signed)
Immediate Anesthesia Transfer of Care Note  Patient: [place]  Procedure(s) Performed: LEFT BREAST LUMPECTOMY WITH RADIOACTIVE SEED LOCALIZATION (Left Breast)  Patient Location: PACU  Anesthesia Type:General  Level of Consciousness: awake, alert  and oriented  Airway & Oxygen Therapy: Patient Spontanous Breathing and Patient connected to nasal cannula oxygen  Post-op Assessment: Report given to RN and Post -op Vital signs reviewed and stable  Post vital signs: Reviewed and stable  Last Vitals:  Vitals Value Taken Time  BP 113/71 10/21/20 0818  Temp    Pulse 63 10/21/20 0819  Resp 15 10/21/20 0819  SpO2 97 % 10/21/20 0819  Vitals shown include unvalidated device data.  Last Pain:  Vitals:   10/21/20 0544  TempSrc: Oral         Complications: No complications documented.

## 2020-10-21 NOTE — Discharge Instructions (Signed)
[facility] Office Phone Number [phone]  BREAST BIOPSY/ PARTIAL MASTECTOMY: POST OP INSTRUCTIONS  Always review your discharge instruction sheet given to you by the facility where your surgery was performed.  IF YOU HAVE DISABILITY OR FAMILY LEAVE FORMS, YOU MUST BRING THEM TO THE OFFICE FOR PROCESSING.  DO NOT GIVE THEM TO YOUR DOCTOR.  1. A prescription for pain medication may be given to you upon discharge.  Take your pain medication as prescribed, if needed.  If narcotic pain medicine is not needed, then you may take acetaminophen (Tylenol) or ibuprofen (Advil) as needed. 2. Take your usually prescribed medications unless otherwise directed 3. If you need a refill on your pain medication, please contact your pharmacy.  They will contact our office to request authorization.  Prescriptions will not be filled after 5pm or on week-ends. 4. You should eat very light the first 24 hours after surgery, such as soup, crackers, pudding, etc.  Resume your normal diet the day after surgery. 5. Most patients will experience some swelling and bruising in the breast.  Ice packs and a good support bra will help.  Swelling and bruising can take several days to resolve.  6. It is common to experience some constipation if taking pain medication after surgery.  Increasing fluid intake and taking a stool softener will usually help or prevent this problem from occurring.  A mild laxative (Milk of Magnesia or Miralax) should be taken according to package directions if there are no bowel movements after 48 hours. 7. Unless discharge instructions indicate otherwise, you may remove your bandages 24-48 hours after surgery, and you may shower at that time.  You may have steri-strips (small skin tapes) in place directly over the incision.  These strips should be left on the skin for 7-10 days.  If your surgeon used skin glue on the incision, you may shower in 24 hours.  The glue will flake off over the  next 2-3 weeks.  Any sutures or staples will be removed at the office during your follow-up visit. 8. ACTIVITIES:  You may resume regular daily activities (gradually increasing) beginning the next day.  Wearing a good support bra or sports bra minimizes pain and swelling.  You may have sexual intercourse when it is comfortable. a. You may drive when you no longer are taking prescription pain medication, you can comfortably wear a seatbelt, and you can safely maneuver your car and apply brakes. b. RETURN TO WORK:  ______________________________________________________________________________________ 9. You should see your doctor in the office for a follow-up appointment approximately two weeks after your surgery.  Your doctor's nurse will typically make your follow-up appointment when she calls you with your pathology report.  Expect your pathology report 2-3 business days after your surgery.  You may call to check if you do not hear from us after three days. 10. OTHER INSTRUCTIONS:OK TO REMOVE THE BINDER AND SHOWER TOMORROW 11. ICE PACK, TYLENOL, AND IBUPROFEN ALSO FOR PAIN 12. NO VIGOROUS ACTIVITY FOR ONE WEEK _______________________________________________________________________________________________ _____________________________________________________________________________________________________________________________________ _____________________________________________________________________________________________________________________________________ _____________________________________________________________________________________________________________________________________  WHEN TO CALL YOUR DOCTOR: 1. Fever over 101.0 2. Nausea and/or vomiting. 3. Extreme swelling or bruising. 4. Continued bleeding from incision. 5. Increased pain, redness, or drainage from the incision.  The clinic staff is available to answer your questions during regular business hours.  Please don't  hesitate to call and ask to speak to one of the nurses for clinical concerns.  If you have a medical emergency, go to the nearest emergency room or call   911.  A surgeon from [facility] is always on call at the hospital.  For further questions, please visit [email or web address]

## 2020-10-21 NOTE — Interval H&P Note (Signed)
History and Physical Interval Note:no change in H and P  10/21/2020 7:03 AM  [place]  has presented today for surgery, with the diagnosis of LEFT BREAST CANCER.  The various methods of treatment have been discussed with the patient and family. After consideration of risks, benefits and other options for treatment, the patient has consented to  Procedure(s) with comments: LEFT BREAST LUMPECTOMY WITH RADIOACTIVE SEED LOCALIZATION (Left) - 60 MINUTES ROOM 2 as a surgical intervention.  The patient's history has been reviewed, patient examined, no change in status, stable for surgery.  I have reviewed the patient's chart and labs.  Questions were answered to the patient's satisfaction.     Coralie Keens

## 2020-10-21 NOTE — Op Note (Signed)
LEFT BREAST LUMPECTOMY WITH RADIOACTIVE SEED LOCALIZATION  Procedure Note  [name] 10/21/2020   Pre-op Diagnosis: LEFT BREAST CANCER     Post-op Diagnosis: same  Procedure(s): LEFT BREAST LUMPECTOMY WITH RADIOACTIVE SEED LOCALIZATION  Surgeon(s): Coralie Keens, MD  Anesthesia: General  Staff:  Circulator: Josem Kaufmann, RN Scrub Person: Celene Squibb, RN  Estimated Blood Loss: Minimal               Specimens: sent to path  Indications: This is a 75 year old female was found to have a mass on screen mammography in the left breast.  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  The decision was made to proceed with a radioactive seed guided left breast lumpectomy.  Procedure: The patient was brought to the operating room identifies correct patient.  She is placed upon the operating table general anesthesia was induced.  Her left breast was prepped and draped in usual sterile fashion.  Using the neoprobe, the radioactive seed was located at approximately 3 o'clock position of left breast deep in the tissue and fairly laterally.  I elected to make an incision over the top of the seed.  I anesthetized skin with Marcaine and then made a longitudinal incision with a scalpel.  I then dissected down to the breast tissue with cautery.  With the aid of neoprobe I then performed a wide lumpectomy going down to the chest wall and attempting to stay widely around the radioactive seed.  I then continued the dissection circumferentially and then removed the lumpectomy specimen from the chest wall with cautery.  Once the lumpectomy specimen was removed I marked all margins with paint.  An x-ray was performed confirming the radioactive seed and previous biopsy clip within the center of the specimen.  The specimen was sent to pathology for evaluation.  I achieved hemostasis with cautery.  I anesthetized the wound further with Marcaine.   I placed surgical clips around the periphery of the lumpectomy cavity.  I then closed the deep tissue and subcutaneous tissue with interrupted 3-0 Vicryl sutures and closed the skin with a running 4-0 Monocryl.  Dermabond was then applied.  The patient was placed in a breast binder.  She tolerated the procedure well.  All the counts were correct at the end of the procedure.  She was then extubated in the operating room and taken in a stable condition to the recovery room.          Coralie Keens   Date: 10/21/2020  Time: 8:07 AM

## 2020-10-21 NOTE — Anesthesia Procedure Notes (Signed)
Procedure Name: LMA Insertion Date/Time: 10/21/2020 8:32 AM Performed by: Imagene Riches, CRNA Pre-anesthesia Checklist: Patient identified, Emergency Drugs available, Suction available and Patient being monitored Patient Re-evaluated:Patient Re-evaluated prior to induction Oxygen Delivery Method: Circle System Utilized Preoxygenation: Pre-oxygenation with 100% oxygen Induction Type: IV induction Ventilation: Mask ventilation without difficulty LMA: LMA inserted LMA Size: 4.0 Number of attempts: 1 Airway Equipment and Method: Bite block Placement Confirmation: positive ETCO2 Tube secured with: Tape Dental Injury: Teeth and Oropharynx as per pre-operative assessment

## 2020-10-23 ENCOUNTER — Encounter (HOSPITAL_COMMUNITY): Payer: Self-pay | Admitting: Surgery

## 2020-10-24 ENCOUNTER — Encounter: Payer: Self-pay | Admitting: *Deleted

## 2020-10-24 LAB — SURGICAL PATHOLOGY

## 2020-10-25 NOTE — Anesthesia Postprocedure Evaluation (Signed)
Anesthesia Post Note  Patient: [name]  Procedure(s) Performed: LEFT BREAST LUMPECTOMY WITH RADIOACTIVE SEED LOCALIZATION (Left Breast)     Patient location during evaluation: PACU Anesthesia Type: General Level of consciousness: awake and alert Pain management: pain level controlled Vital Signs Assessment: post-procedure vital signs reviewed and stable Respiratory status: spontaneous breathing, nonlabored ventilation and respiratory function stable Cardiovascular status: blood pressure returned to baseline and stable Postop Assessment: no apparent nausea or vomiting Anesthetic complications: no   No complications documented.  Last Vitals:  Vitals:   10/21/20 0834 10/21/20 0846  BP: 112/68 127/65  Pulse: 65 72  Resp: (!) 24   Temp:  (!) 36.2 C  SpO2: 99% 97%    Last Pain:  Vitals:   10/21/20 0818  TempSrc:   PainSc: 0-No pain   Pain Goal:                   [name]

## 2020-11-22 ENCOUNTER — Encounter: Payer: Self-pay | Admitting: Radiation Oncology

## 2020-11-22 ENCOUNTER — Other Ambulatory Visit: Payer: Self-pay

## 2020-11-22 ENCOUNTER — Ambulatory Visit
Admission: RE | Admit: 2020-11-22 | Discharge: 2020-11-22 | Disposition: A | Discharge status: Home / Self Care | Payer: Medicare Other | Source: Ambulatory Visit | Attending: Radiation Oncology | Admitting: Radiation Oncology

## 2020-11-22 VITALS — BP 123/71 | HR 79 | Temp 97.7°F | Resp 18 | Ht 61.0 in | Wt 148.6 lb

## 2020-11-22 DIAGNOSIS — Z7982 Long term (current) use of aspirin: Secondary | ICD-10-CM | POA: Diagnosis not present

## 2020-11-22 DIAGNOSIS — Z170 Estrogen receptor positive status [ER+]: Secondary | ICD-10-CM

## 2020-11-22 DIAGNOSIS — M47812 Spondylosis without myelopathy or radiculopathy, cervical region: Secondary | ICD-10-CM | POA: Diagnosis not present

## 2020-11-22 DIAGNOSIS — J439 Emphysema, unspecified: Secondary | ICD-10-CM | POA: Diagnosis not present

## 2020-11-22 DIAGNOSIS — Z87442 Personal history of urinary calculi: Secondary | ICD-10-CM | POA: Diagnosis not present

## 2020-11-22 DIAGNOSIS — R0602 Shortness of breath: Secondary | ICD-10-CM | POA: Diagnosis not present

## 2020-11-22 DIAGNOSIS — Z85828 Personal history of other malignant neoplasm of skin: Secondary | ICD-10-CM | POA: Insufficient documentation

## 2020-11-22 DIAGNOSIS — I10 Essential (primary) hypertension: Secondary | ICD-10-CM | POA: Insufficient documentation

## 2020-11-22 DIAGNOSIS — Z79899 Other long term (current) drug therapy: Secondary | ICD-10-CM | POA: Diagnosis not present

## 2020-11-22 DIAGNOSIS — C50412 Malignant neoplasm of upper-outer quadrant of left female breast: Secondary | ICD-10-CM

## 2020-11-22 DIAGNOSIS — Z86010 Personal history of colonic polyps: Secondary | ICD-10-CM | POA: Insufficient documentation

## 2020-11-22 DIAGNOSIS — J449 Chronic obstructive pulmonary disease, unspecified: Secondary | ICD-10-CM | POA: Diagnosis not present

## 2020-11-22 DIAGNOSIS — M1990 Unspecified osteoarthritis, unspecified site: Secondary | ICD-10-CM | POA: Diagnosis not present

## 2020-11-22 DIAGNOSIS — F17210 Nicotine dependence, cigarettes, uncomplicated: Secondary | ICD-10-CM | POA: Diagnosis not present

## 2020-11-22 DIAGNOSIS — K219 Gastro-esophageal reflux disease without esophagitis: Secondary | ICD-10-CM | POA: Diagnosis not present

## 2020-11-22 NOTE — Progress Notes (Signed)
Radiation Oncology         [phone]) [phone] ________________________________  Name: [name]        MRN: [id number]  Date of Service: 11/22/2020 DOB: 02/27/1946  LH:TDSKAJG, Kelsey Rodney, MD  Magrinat, Kelsey Dad, MD     REFERRING PHYSICIAN: Magrinat, Kelsey Dad, MD   DIAGNOSIS: The encounter diagnosis was Malignant neoplasm of upper-outer quadrant of left breast in female, estrogen receptor positive ([place]).   HISTORY OF PRESENT ILLNESS: [name] is a 75 y.o. female originally seen in the multidisciplinary breast clinic for a new diagnosis of left breast cancer. The patient was noted to have a screening detected mass in the left breast. On diagnostic imaging this measured 1 cm in the 3:00 position. 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 Her cancer was ER/PR positive, HER2 was negative with a Ki 67 of 5%. She's undergone left lumpectomy on 10/21/2020 which revealed a grade 1 invasive ductal carcinoma spanning 1.5 cm with low-grade DCIS, invasive disease was less than 1 mm from the posterior margin broadly and 3 mm from the inferior margin and in situ disease was present at the posterior margin focally less than 1 mm from the lateral margin broadly and 3 mm from the inferior margin.  There is also associated complex sclerosing lesion.  No additional surgery is planned.  She will not receive systemic chemotherapy, and is seen today to review the rationale for adjuvant radiotherapy to the breast.   PREVIOUS RADIATION THERAPY: No   PAST MEDICAL HISTORY:  Past Medical History:  Diagnosis Date  . Allergy   . Ankle fracture 03/16/2017   right  . Breast cancer ([place])   . Bulging lumbar disc    L4 OR L5 TAKES STEROID INJECTIONS FOR TOOK INJECTION MARCH 2019  . 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 Chronic bronchitis ([name]) FEW YEARS AGO  . Chronic hepatitis C ([place])    TOOK [place] 2017   . Colon polyp   . COPD (chronic obstructive pulmonary  disease) ([place])   . Diverticulitis 39YRS AGO  . Dyspnea    WITH HEAVY EXERTION  . Emphysema of lung ([facility])    TOOK PULMONARY FUCTION TEST AND PASSED FEW WEEKS AGO AT FAMILY  MD  . Gallstones   . GERD (gastroesophageal reflux disease)   . History of kidney stones 20 YRS AGO   . Hypertension   . Osteoarthritis   . Pneumonia AT BIRTH  . Spondylosis of cervical region without myelopathy or radiculopathy        PAST SURGICAL HISTORY: Past Surgical History:  Procedure Laterality Date  . ABDOMINAL HYSTERECTOMY  1980   PARTIAL  . ANKLE ARTHROSCOPY Right 10/02/2017   Procedure: RIGHT ANKLE ARTHROSCOPY WITH EXTENSIVE DEBRIDEMENT;  Surgeon: Dorna Leitz, MD;  Location: [facility];  Service: Orthopedics;  Laterality: Right;  90 MINUTES FOR CASE  . BREAST BIOPSY  02/07/2010  . BREAST LUMPECTOMY WITH RADIOACTIVE SEED LOCALIZATION Left 10/21/2020   Procedure: LEFT BREAST LUMPECTOMY WITH RADIOACTIVE SEED LOCALIZATION;  Surgeon: Coralie Keens, MD;  Location: [facility];  Service: General;  Laterality: Left;  60 MINUTES ROOM 2  . BREAST SURGERY  2011   biopsy  . CHOLECYSTECTOMY  1999   LAPAROSCOPIC  . HARDWARE REMOVAL Right 10/02/2017   Procedure: HARDWARE REMOVAL;  Surgeon: Dorna Leitz, MD;  Location[facility];  Service: Orthopedics;  Laterality: Right;  . INJECTION OF SILICONE OIL Left  01/04/2020   Procedure: INJECTION OF SILICONE OIL;  Surgeon: Sherlynn Stalls, MD;  Location: [place];  Service: Ophthalmology;  Laterality: Left;  . MEMBRANE PEEL Left 01/04/2020   Procedure: MEMBRANE PEEL;  Surgeon: Sherlynn Stalls, MD;  Location: [place];  Service: Ophthalmology;  Laterality: Left;  . ORIF ANKLE FRACTURE Right 03/20/2017   Procedure: OPEN REDUCTION INTERNAL FIXATION (ORIF) ANKLE FRACTURE;  Surgeon: Dorna Leitz, MD;  Location: [place];  Service: Orthopedics;  Laterality: Right;  . PARS PLANA VITRECTOMY Left 01/04/2020   Procedure: PARS PLANA VITRECTOMY WITH 25  GAUGE;  Surgeon: Sherlynn Stalls, MD;  Location: [place];  Service: Ophthalmology;  Laterality: Left;  . PHOTOCOAGULATION WITH LASER Left 01/04/2020   Procedure: PHOTOCOAGULATION WITH LASER;  Surgeon: Sherlynn Stalls, MD;  Location: [place];  Service: Ophthalmology;  Laterality: Left;  . TONSILLECTOMY    . TONSILLECTOMY AND ADENOIDECTOMY  1950's     FAMILY HISTORY:  Family History  Problem Relation Age of Onset  . Heart disease Mother   . Stroke Brother   . Stroke Maternal Uncle   . COPD Paternal Uncle      SOCIAL HISTORY:  reports that she has been smoking cigarettes. She started smoking about 60 years ago. She has a 60.00 pack-year smoking history. She has never used smokeless tobacco. She reports previous alcohol use. She reports that she does not use drugs.The patient lives in [place]. She is single and is very active. She enjoys riding a bike, jogging, and bowling.     ALLERGIES: Chantix [varenicline] and Codeine   MEDICATIONS:  Current Outpatient Medications  Medication Sig Dispense Refill  . albuterol (PROVENTIL HFA;VENTOLIN HFA) 108 (90 BASE) MCG/ACT inhaler Inhale 2 puffs into the lungs every 6 (six) hours as needed for wheezing or shortness of breath.     Marland Kitchen amLODipine (NORVASC) 5 MG tablet Take 5 mg by mouth every evening.    Marland Kitchen aspirin EC 81 MG tablet Take 81 mg by mouth daily. Swallow whole.    Marland Kitchen atorvastatin (LIPITOR) 20 MG tablet Take 20 mg by mouth every evening.     . cholecalciferol (VITAMIN D3) 25 MCG (1000 UNIT) tablet Take 1,000 Units by mouth daily.    . fluticasone furoate-vilanterol (BREO ELLIPTA) 200-25 MCG/INH AEPB Inhale 1 puff into the lungs daily.    Marland Kitchen ipratropium (ATROVENT) 0.06 % nasal spray Place 2 sprays into both nostrils daily as needed for rhinitis.    Marland Kitchen mirabegron ER (MYRBETRIQ) 50 MG TB24 tablet Take 50 mg by mouth daily.    . montelukast (SINGULAIR) 10 MG tablet Take 10 mg by mouth every evening.     Marland Kitchen oxybutynin (DITROPAN-XL) 5 MG 24 hr tablet Take  5 mg by mouth daily.    Marland Kitchen PREVIDENT 5000 BOOSTER PLUS 1.1 % PSTE Place 1 application onto teeth daily.    . traMADol (ULTRAM) 50 MG tablet Take 1 tablet (50 mg total) by mouth every 6 (six) hours as needed for moderate pain or severe pain. (Patient not taking: Reported on 11/22/2020) 25 tablet 0   No current facility-administered medications for this encounter.     REVIEW OF SYSTEMS: On review of systems, the patient reports that she is doing well overall. She has healed well since surgery without concerns about her breast at this time.      PHYSICAL EXAM:  Wt Readings from Last 3 Encounters:  11/22/20 148 lb 9.6 oz (67.4 kg)  10/21/20 152 lb (68.9 kg)  10/17/20 149 lb (67.6  kg)   Temp Readings from Last 3 Encounters:  11/22/20 97.7 F (36.5 C)  10/21/20 (!) 97.2 F (36.2 C)  10/17/20 98.2 F (36.8 C) (Oral)   BP Readings from Last 3 Encounters:  11/22/20 123/71  10/21/20 127/65  10/17/20 (!) 108/94   Pulse Readings from Last 3 Encounters:  11/22/20 79  10/21/20 72  10/17/20 84    In general this is a well appearing caucasian female in no acute distress. She's alert and oriented x4 and appropriate throughout the examination. Cardiopulmonary assessment is negative for acute distress and she exhibits normal effort.  The left breast has an intact incision site without erythema or separation.  There is induration deep to the surgical site without fluctuance.   ECOG = 0  0 - Asymptomatic (Fully active, able to carry on all predisease activities without restriction)  1 - Symptomatic but completely ambulatory (Restricted in physically strenuous activity but ambulatory and able to carry out work of a light or sedentary nature. For example, light housework, office work)  2 - Symptomatic, <50% in bed during the day (Ambulatory and capable of all self care but unable to carry out any work activities. Up and about more than 50% of waking hours)  3 - Symptomatic, >50% in bed, but  not bedbound (Capable of only limited self-care, confined to bed or chair 50% or more of waking hours)  4 - Bedbound (Completely disabled. Cannot carry on any self-care. Totally confined to bed or chair)  5 - Death   Eustace Pen MM, Creech RH, Tormey DC, et al. [phone]). "Toxicity and response criteria of the [facility]". [place] Oncol. 5 (6): 649-55    LABORATORY DATA:  Lab Results  Component Value Date   WBC 8.0 10/17/2020   HGB 14.2 10/17/2020   HCT 41.7 10/17/2020   MCV 90.1 10/17/2020   PLT 273 10/17/2020   Lab Results  Component Value Date   NA 138 10/17/2020   K 4.0 10/17/2020   CL 104 10/17/2020   CO2 24 10/17/2020   Lab Results  Component Value Date   ALT 16 10/17/2020   AST 22 10/17/2020   ALKPHOS 57 10/17/2020   BILITOT 1.2 10/17/2020      RADIOGRAPHY: No results found.     IMPRESSION/PLAN: 1. Stage IA, pT1c, cN0M0 grade 1, ER/PR positive invasive ductal carcinoma of the left breast. Dr. Lisbeth Renshaw discusses the final pathology findings and reviews the nature of left breast disease.  We reviewed the patient's pathology results, and given the margins, Dr. Lisbeth Renshaw feels that she would benefit from adjuvant external radiotherapy to the breast  to reduce risks of local recurrence.  She will follow-up as well in June with Dr. Jana Hakim to discuss initiating antiestrogen therapy. We discussed the risks, benefits, short, and long term effects of radiotherapy, as well as the curative intent. Dr. Lisbeth Renshaw discusses the delivery and logistics of radiotherapy and recommends 4 weeks of radiotherapy to the left breast with deep inspiration breath-hold technique. Written consent is obtained and placed in the chart, a copy was provided to the patient. She will simulate this morning.    In a visit lasting 45 minutes, greater than 50% of the time was spent face to face reviewing her case, as well as in preparation of, discussing, and coordinating the patient's  care.  The above documentation reflects my direct findings during this shared patient visit. Please see the separate note by Dr. Lisbeth Renshaw on this date for the remainder  of the patient's plan of care.    Carola Rhine, [facility]    **Disclaimer: This note was dictated with voice recognition software. Similar sounding words can inadvertently be transcribed and this note may contain transcription errors which may not have been corrected upon publication of note.**

## 2020-11-22 NOTE — Progress Notes (Signed)
New Breast Cancer Diagnosis: Left Breast   Histology per Pathology Report:   Receptor Status: ER(positive), PR (positive), Her2-neu (negative), Ki-(5%)  Surgeon and surgical plan, if any: Dr. Ninfa Linden -Left Breast Lumpectomy with radioactive seed localization 10/21/2020  Medical oncologist, treatment if any:     Family History of Breast/Ovarian/Prostate Cancer: None noted  Lymphedema issues, if any:  No  Pain issues, if any: Sore  SAFETY ISSUES: Prior radiation? No Pacemaker/ICD? No Possible current pregnancy? Hysterectomy Is the patient on methotrexate? No  Current Complaints / other details:

## 2020-11-25 ENCOUNTER — Encounter: Payer: Self-pay | Admitting: *Deleted

## 2020-11-29 ENCOUNTER — Ambulatory Visit
Admission: RE | Admit: 2020-11-29 | Discharge: 2020-11-29 | Disposition: A | Discharge status: Home / Self Care | Payer: Medicare Other | Source: Ambulatory Visit | Attending: Family Medicine | Admitting: Family Medicine

## 2020-11-29 ENCOUNTER — Other Ambulatory Visit: Payer: Self-pay

## 2020-11-29 DIAGNOSIS — M810 Age-related osteoporosis without current pathological fracture: Secondary | ICD-10-CM

## 2020-12-02 DIAGNOSIS — C50412 Malignant neoplasm of upper-outer quadrant of left female breast: Secondary | ICD-10-CM | POA: Diagnosis not present

## 2020-12-02 DIAGNOSIS — Z170 Estrogen receptor positive status [ER+]: Secondary | ICD-10-CM | POA: Insufficient documentation

## 2020-12-05 ENCOUNTER — Other Ambulatory Visit: Payer: Self-pay

## 2020-12-05 ENCOUNTER — Ambulatory Visit
Admission: RE | Admit: 2020-12-05 | Discharge: 2020-12-05 | Disposition: A | Discharge status: Home / Self Care | Payer: Medicare Other | Source: Ambulatory Visit | Attending: Radiation Oncology | Admitting: Radiation Oncology

## 2020-12-05 DIAGNOSIS — C50412 Malignant neoplasm of upper-outer quadrant of left female breast: Secondary | ICD-10-CM | POA: Diagnosis not present

## 2020-12-06 ENCOUNTER — Ambulatory Visit
Admission: RE | Admit: 2020-12-06 | Discharge: 2020-12-06 | Disposition: A | Discharge status: Home / Self Care | Payer: Medicare Other | Source: Ambulatory Visit | Attending: Radiation Oncology | Admitting: Radiation Oncology

## 2020-12-06 DIAGNOSIS — C50412 Malignant neoplasm of upper-outer quadrant of left female breast: Secondary | ICD-10-CM | POA: Diagnosis not present

## 2020-12-07 ENCOUNTER — Ambulatory Visit
Admission: RE | Admit: 2020-12-07 | Discharge: 2020-12-07 | Disposition: A | Discharge status: Home / Self Care | Payer: Medicare Other | Source: Ambulatory Visit | Attending: Radiation Oncology | Admitting: Radiation Oncology

## 2020-12-07 ENCOUNTER — Other Ambulatory Visit: Payer: Self-pay

## 2020-12-07 DIAGNOSIS — C50412 Malignant neoplasm of upper-outer quadrant of left female breast: Secondary | ICD-10-CM | POA: Diagnosis not present

## 2020-12-08 ENCOUNTER — Ambulatory Visit
Admission: RE | Admit: 2020-12-08 | Discharge: 2020-12-08 | Disposition: A | Discharge status: Home / Self Care | Payer: Medicare Other | Source: Ambulatory Visit | Attending: Radiation Oncology | Admitting: Radiation Oncology

## 2020-12-08 ENCOUNTER — Other Ambulatory Visit: Payer: Self-pay

## 2020-12-08 DIAGNOSIS — C50412 Malignant neoplasm of upper-outer quadrant of left female breast: Secondary | ICD-10-CM | POA: Diagnosis not present

## 2020-12-09 ENCOUNTER — Ambulatory Visit
Admission: RE | Admit: 2020-12-09 | Discharge: 2020-12-09 | Disposition: A | Discharge status: Home / Self Care | Payer: Medicare Other | Source: Ambulatory Visit | Attending: Radiation Oncology | Admitting: Radiation Oncology

## 2020-12-09 DIAGNOSIS — Z170 Estrogen receptor positive status [ER+]: Secondary | ICD-10-CM

## 2020-12-09 DIAGNOSIS — C50412 Malignant neoplasm of upper-outer quadrant of left female breast: Secondary | ICD-10-CM | POA: Diagnosis not present

## 2020-12-09 MED ORDER — ALRA NON-METALLIC DEODORANT (RAD-ONC)
1.0000 | Freq: Once | TOPICAL | Status: AC
Start: 2020-12-09 — End: 2020-12-09
  Administered 2020-12-09: 1 via TOPICAL

## 2020-12-09 MED ORDER — RADIAPLEXRX EX GEL
Freq: Once | CUTANEOUS | Status: AC
Start: 2020-12-09 — End: 2020-12-09

## 2020-12-09 NOTE — Progress Notes (Signed)

## 2020-12-12 ENCOUNTER — Ambulatory Visit
Admission: RE | Admit: 2020-12-12 | Discharge: 2020-12-12 | Disposition: A | Discharge status: Home / Self Care | Payer: Medicare Other | Source: Ambulatory Visit | Attending: Radiation Oncology | Admitting: Radiation Oncology

## 2020-12-12 ENCOUNTER — Other Ambulatory Visit: Payer: Self-pay

## 2020-12-12 DIAGNOSIS — C50412 Malignant neoplasm of upper-outer quadrant of left female breast: Secondary | ICD-10-CM | POA: Diagnosis not present

## 2020-12-13 ENCOUNTER — Ambulatory Visit
Admission: RE | Admit: 2020-12-13 | Discharge: 2020-12-13 | Disposition: A | Discharge status: Home / Self Care | Payer: Medicare Other | Source: Ambulatory Visit | Attending: Radiation Oncology | Admitting: Radiation Oncology

## 2020-12-13 DIAGNOSIS — C50412 Malignant neoplasm of upper-outer quadrant of left female breast: Secondary | ICD-10-CM | POA: Diagnosis not present

## 2020-12-14 ENCOUNTER — Ambulatory Visit
Admission: RE | Admit: 2020-12-14 | Discharge: 2020-12-14 | Disposition: A | Discharge status: Home / Self Care | Payer: Medicare Other | Source: Ambulatory Visit | Attending: Radiation Oncology | Admitting: Radiation Oncology

## 2020-12-14 DIAGNOSIS — C50412 Malignant neoplasm of upper-outer quadrant of left female breast: Secondary | ICD-10-CM | POA: Diagnosis not present

## 2020-12-15 ENCOUNTER — Ambulatory Visit
Admission: RE | Admit: 2020-12-15 | Discharge: 2020-12-15 | Disposition: A | Discharge status: Home / Self Care | Payer: Medicare Other | Source: Ambulatory Visit | Attending: Radiation Oncology | Admitting: Radiation Oncology

## 2020-12-15 ENCOUNTER — Other Ambulatory Visit: Payer: Self-pay

## 2020-12-15 DIAGNOSIS — C50412 Malignant neoplasm of upper-outer quadrant of left female breast: Secondary | ICD-10-CM | POA: Diagnosis not present

## 2020-12-16 ENCOUNTER — Ambulatory Visit
Admission: RE | Admit: 2020-12-16 | Discharge: 2020-12-16 | Disposition: A | Discharge status: Home / Self Care | Payer: Medicare Other | Source: Ambulatory Visit | Attending: Radiation Oncology | Admitting: Radiation Oncology

## 2020-12-16 DIAGNOSIS — C50412 Malignant neoplasm of upper-outer quadrant of left female breast: Secondary | ICD-10-CM | POA: Diagnosis not present

## 2020-12-19 ENCOUNTER — Other Ambulatory Visit: Payer: Self-pay

## 2020-12-19 ENCOUNTER — Ambulatory Visit
Admission: RE | Admit: 2020-12-19 | Discharge: 2020-12-19 | Disposition: A | Discharge status: Home / Self Care | Payer: Medicare Other | Source: Ambulatory Visit | Attending: Radiation Oncology | Admitting: Radiation Oncology

## 2020-12-19 DIAGNOSIS — C50412 Malignant neoplasm of upper-outer quadrant of left female breast: Secondary | ICD-10-CM | POA: Diagnosis not present

## 2020-12-20 ENCOUNTER — Ambulatory Visit
Admission: RE | Admit: 2020-12-20 | Discharge: 2020-12-20 | Disposition: A | Discharge status: Home / Self Care | Payer: Medicare Other | Source: Ambulatory Visit | Attending: Radiation Oncology | Admitting: Radiation Oncology

## 2020-12-20 DIAGNOSIS — C50412 Malignant neoplasm of upper-outer quadrant of left female breast: Secondary | ICD-10-CM | POA: Diagnosis not present

## 2020-12-21 ENCOUNTER — Ambulatory Visit
Admission: RE | Admit: 2020-12-21 | Discharge: 2020-12-21 | Disposition: A | Discharge status: Home / Self Care | Payer: Medicare Other | Source: Ambulatory Visit | Attending: Radiation Oncology | Admitting: Radiation Oncology

## 2020-12-21 ENCOUNTER — Other Ambulatory Visit: Payer: Self-pay

## 2020-12-21 DIAGNOSIS — C50412 Malignant neoplasm of upper-outer quadrant of left female breast: Secondary | ICD-10-CM | POA: Diagnosis not present

## 2020-12-21 NOTE — Progress Notes (Addendum)
[place]  [phone] [phone]     ID: [name] DOB: 11/04/1945  MR#: [id number]  PPH#:[id number]  Patient Care Team: Katherina Mires, MD as PCP - General (Family Medicine) Mauro Kaufmann, RN as Oncology Nurse Navigator Rockwell Germany, RN as Oncology Nurse Navigator Coralie Keens, MD as Consulting Physician (General Surgery) Zoraya Fiorenza, Virgie Dad, MD as Consulting Physician (Oncology) Kyung Rudd, MD as Consulting Physician (Radiation Oncology) Sherlynn Stalls, MD as Consulting Physician (Ophthalmology) Lenna Sciara, MD as Referring Physician (Gastroenterology) Damian Leavell, MD as Referring Physician (Obstetrics and Gynecology) Harriett Sine, MD as Consulting Physician (Dermatology) Aurea Graff OTHER MD:  CHIEF COMPLAINT: Estrogen receptor positive breast cancer  CURRENT TREATMENT: To start tamoxifen 01/30/2021   INTERVAL HISTORY: [name] returns today for follow up of her estrogen receptor positive breast cancer. She was evaluated in the multidisciplinary breast cancer clinic on 10/05/2020.  She proceeded to left lumpectomy on 10/21/2020 under Dr. Ninfa Linden. Pathology from the procedure ([id number]) showed: invasive ductal carcinoma, grade 1, 1.5 cm; low grade ductal carcinoma in situ; complex sclerosing lesion; margins not involved.   She was referred back to Dr. Lisbeth Renshaw on 11/22/2020 to review radiation therapy. She subsequently began treatment on 12/05/2020 and is scheduled to finish on 12/30/2020.  Of note, she also underwent bone density screening on 11/29/2020 showing a T-score of -1.0, which is considered normal. This is stable from prior in 08/2017.   REVIEW OF SYSTEMS: [name] did well with her surgery and also is doing well with her radiation.  She is moderately fatigued.  A detailed review of systems today was otherwise noncontributory   COVID 19 VACCINATION STATUS: fully vaccinated [employer]), with booster  04/2020   HISTORY OF CURRENT ILLNESS: From the original intake note:  [name] had routine screening mammography on 09/06/2020 showing a possible abnormality in the bilateral breasts. She underwent bilateral diagnostic mammography with tomography and bilateral breast ultrasonography at [facility] on 09/26/2020 showing: breast density category C; 1.1 cm left breast mass at 3 o'clock; benign simple cysts in upper-outer right breast; normal left axillary lymph nodes.  Accordingly on 09/27/2020 she proceeded to biopsy of the left breast area in question. The pathology from this procedure ([id number]) showed: invasive and in situ mammary carcinoma, e-cadherin positive, grade 2. Prognostic indicators significant for: estrogen receptor, 95% positive and progesterone receptor, 95% positive, both with strong staining intensity. Proliferation marker Ki67 at 5%. HER2 equivocal by immunohistochemistry (2+), but negative by fluorescent in situ hybridization with a signals ratio 1.22 and number per cell 1.95.  Cancer Staging Malignant neoplasm of upper-outer quadrant of left breast in female, estrogen receptor positive ([place]) Staging form: Breast, AJCC 8th Edition - Clinical stage from 10/05/2020: Stage IA (cT1c, cN0, cM0, G2, ER+, PR+, HER2-) - Signed by Chauncey Cruel, MD on 10/05/2020 Stage prefix: Initial diagnosis Method of lymph node assessment: Clinical Histologic grading system: 3 grade system - Pathologic stage from 10/21/2020: Stage IA (pT1c, pN0, cM0, G1, ER+, PR+, HER2-) - Signed by Gardenia Phlegm, NP on 11/02/2020 Histologic grading system: 3 grade system  The patient's subsequent history is as detailed below.   PAST MEDICAL HISTORY: Past Medical History:  Diagnosis Date   Allergy    Ankle fracture 03/16/2017   right   Breast cancer (HCC)    Bulging lumbar disc    L4 OR L5 TAKES STEROID INJECTIONS FOR TOOK INJECTION MARCH 2019   Cancer [facility])    Skin CA,  removed    Chronic bronchitis ([place]) FEW YEARS AGO   Chronic hepatitis C ([place])    TOOK [place] 2017    Colon polyp    COPD (chronic obstructive pulmonary disease) (HCC)    Diverticulitis 34YRS AGO   Dyspnea    WITH HEAVY EXERTION   Emphysema of lung (HCC)    TOOK PULMONARY FUCTION TEST AND PASSED FEW WEEKS AGO AT [facility]ILY  MD   Gallstones    GERD (gastroesophageal reflux disease)    History of kidney stones 20 YRS AGO    Hypertension    Osteoarthritis    Pneumonia AT BIRTH   Spondylosis of cervical region without myelopathy or radiculopathy     PAST SURGICAL HISTORY: Past Surgical History:  Procedure Laterality Date   ABDOMINAL HYSTERECTOMY  1980   PARTIAL   ANKLE ARTHROSCOPY Right 10/02/2017   Procedure: RIGHT ANKLE ARTHROSCOPY WITH EXTENSIVE DEBRIDEMENT;  Surgeon: Dorna Leitz, MD;  Location: [place];  Service: Orthopedics;  Laterality: Right;  90 MINUTES FOR CASE   BREAST BIOPSY  02/07/2010   BREAST LUMPECTOMY WITH RADIOACTIVE SEED LOCALIZATION Left 10/21/2020   Procedure: LEFT BREAST LUMPECTOMY WITH RADIOACTIVE SEED LOCALIZATION;  Surgeon: Coralie Keens, MD;  Location: [place];  Service: General;  Laterality: Left;  [place] 2   BREAST SURGERY  2011   biopsy   CHOLECYSTECTOMY  1999   LAPAROSCOPIC   HARDWARE REMOVAL Right 10/02/2017   Procedure: HARDWARE REMOVAL;  Surgeon: Dorna Leitz, MD;  Location: [place];  Service: Orthopedics;  Laterality: Right;   INJECTION OF SILICONE OIL Left 10/02/3534   Procedure: INJECTION OF SILICONE OIL;  Surgeon: Sherlynn Stalls, MD;  Location: [place];  Service: Ophthalmology;  Laterality: Left;   MEMBRANE PEEL Left 01/04/2020   Procedure: MEMBRANE PEEL;  Surgeon: Sherlynn Stalls, MD;  Location: [place];  Service: Ophthalmology;  Laterality: Left;   ORIF ANKLE FRACTURE Right 03/20/2017   Procedure: OPEN REDUCTION INTERNAL FIXATION (ORIF) ANKLE FRACTURE;  Surgeon: Dorna Leitz, MD;  Location: [place];   Service: Orthopedics;  Laterality: Right;   PARS PLANA VITRECTOMY Left 01/04/2020   Procedure: PARS PLANA VITRECTOMY WITH 25 GAUGE;  Surgeon: Sherlynn Stalls, MD;  Location: [place];  Service: Ophthalmology;  Laterality: Left;   PHOTOCOAGULATION WITH LASER Left 01/04/2020   Procedure: PHOTOCOAGULATION WITH LASER;  Surgeon: Sherlynn Stalls, MD;  Location: [place];  Service: Ophthalmology;  Laterality: Left;   TONSILLECTOMY     TONSILLECTOMY AND ADENOIDECTOMY  52's    FAMILY HISTORY: Family History  Problem Relation Age of Onset   Heart disease Mother    Stroke Brother    Stroke Maternal Uncle    COPD Paternal Uncle    Her father died at age 68 from "old age." Her mother died at age 15 from [place] has one brother and one sister. She reports colon cancer in a paternal cousin (female) in her 5's. There is no family history of breast or ovarian cancer to her knowledge.   GYNECOLOGIC HISTORY:  No LMP recorded. Patient has had a hysterectomy. Menarche: 75 years old [place] P 0 LMP with hysterectomy Contraceptive: previously used without issue HRT used for 6 years  Hysterectomy? yes BSO? 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 She is divorced. She lives at home by herself, with her two cats. She is a current smoker with no plans to quit. She attends [employer].    ADVANCED DIRECTIVES: not in place; intends to name cousin Azucena Kuba as her 39. He lives in [facility] and can be reached at [phone].   HEALTH MAINTENANCE: Social History   Tobacco Use   Smoking status: Every Day    Packs/day: 1.00    Years: 60.00    Pack years: 60.00    Types: Cigarettes    Start date: 07/20/1960   Smokeless tobacco: Never  Vaping Use   Vaping Use: Former  Substance Use Topics   Alcohol use: Not Currently    Alcohol/week: 0.0 standard drinks    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   Drug use: No     Colonoscopy:  04/2010  PAP:   Bone density: 10/2020, -1.0   Allergies  Allergen Reactions   Chantix [Varenicline]     Makes her feel crazy   Codeine Itching    Current Outpatient Medications  Medication Sig Dispense Refill   albuterol (PROVENTIL HFA;VENTOLIN HFA) 108 (90 BASE) MCG/ACT inhaler Inhale 2 puffs into the lungs every 6 (six) hours as needed for wheezing or shortness of breath.      amLODipine (NORVASC) 5 MG tablet Take 5 mg by mouth every evening.     aspirin EC 81 MG tablet Take 81 mg by mouth daily. Swallow whole.     atorvastatin (LIPITOR) 20 MG tablet Take 20 mg by mouth every evening.      cholecalciferol (VITAMIN D3) 25 MCG (1000 UNIT) tablet Take 1,000 Units by mouth daily.     fluticasone furoate-vilanterol (BREO ELLIPTA) 200-25 MCG/INH AEPB Inhale 1 puff into the lungs daily.     ipratropium (ATROVENT) 0.06 % nasal spray Place 2 sprays into both nostrils daily as needed for rhinitis.     mirabegron ER (MYRBETRIQ) 50 MG TB24 tablet Take 50 mg by mouth daily.     montelukast (SINGULAIR) 10 MG tablet Take 10 mg by mouth every evening.      oxybutynin (DITROPAN-XL) 5 MG 24 hr tablet Take 5 mg by mouth daily.     PREVIDENT 5000 BOOSTER PLUS 1.1 % PSTE Place 1 application onto teeth daily.     traMADol (ULTRAM) 50 MG tablet Take 1 tablet (50 mg total) by mouth every 6 (six) hours as needed for moderate pain or severe pain. (Patient not taking: Reported on 11/22/2020) 25 tablet 0   No current facility-administered medications for this visit.    OBJECTIVE: White woman who appears stated age  There were no vitals filed for this visit.    There is no height or weight on file to calculate BMI.   Wt Readings from Last 3 Encounters:  11/22/20 148 lb 9.6 oz (67.4 kg)  10/21/20 152 lb (68.9 kg)  10/17/20 149 lb (67.6 kg)      ECOG FS:1 - Symptomatic but completely ambulatory  Sclerae unicteric, EOMs intact Wearing a mask No cervical or supraclavicular adenopathy Lungs no rales or  rhonchi Heart regular rate and rhythm Abd soft, nontender, positive bowel sounds MSK no focal spinal tenderness, no upper extremity lymphedema Neuro: nonfocal, well oriented, appropriate affect Breasts: The right breast is benign.  The left breast is status post lumpectomy and is currently receiving radiation.  The cosmetic result is good.  There is minimal erythema, no desquamation.  Both axillae are benign.    LAB RESULTS:  CMP     Component Value Date/Time   NA 138 10/17/2020 1429   K 4.0 10/17/2020 1429  CL 104 10/17/2020 1429   CO2 24 10/17/2020 1429   GLUCOSE 114 (H) 10/17/2020 1429   BUN 15 10/17/2020 1429   CREATININE 0.99 10/17/2020 1429   CREATININE 0.92 10/05/2020 0839   CALCIUM 9.2 10/17/2020 1429   PROT 7.1 10/17/2020 1429   ALBUMIN 4.1 10/17/2020 1429   AST 22 10/17/2020 1429   AST 16 10/05/2020 0839   ALT 16 10/17/2020 1429   ALT 12 10/05/2020 0839   ALKPHOS 57 10/17/2020 1429   BILITOT 1.2 10/17/2020 1429   BILITOT 0.9 10/05/2020 0839   GFRNONAA 59 (L) 10/17/2020 1429   GFRNONAA >60 10/05/2020 0839    No results found for: TOTALPROTELP, ALBUMINELP, A1GS, A2GS, BETS, BETA2SER, GAMS, MSPIKE, SPEI  Lab Results  Component Value Date   WBC 8.0 10/17/2020   NEUTROABS 4.0 10/05/2020   HGB 14.2 10/17/2020   HCT 41.7 10/17/2020   MCV 90.1 10/17/2020   PLT 273 10/17/2020    No results found for: LABCA2  No components found for: NOMVEH209  No results for input(s): INR in the last 168 hours.  No results found for: LABCA2  No results found for: OBS962  No results found for: EZM629  No results found for: UTM546  No results found for: CA2729  No components found for: HGQUANT  No results found for: CEA1 / No results found for: CEA1   No results found for: AFPTUMOR  No results found for: CHROMOGRNA  No results found for: KPAFRELGTCHN, LAMBDASER, KAPLAMBRATIO (kappa/lambda light chains)  No results found for: HGBA, HGBA2QUANT, HGBFQUANT,  HGBSQUAN (Hemoglobinopathy evaluation)   No results found for: LDH  No results found for: IRON, TIBC, IRONPCTSAT (Iron and TIBC)  No results found for: FERRITIN  Urinalysis No results found for: COLORURINE, APPEARANCEUR, LABSPEC, PHURINE, GLUCOSEU, HGBUR, BILIRUBINUR, KETONESUR, PROTEINUR, UROBILINOGEN, NITRITE, LEUKOCYTESUR   STUDIES: DG BONE DENSITY (DXA)  Result Date: 11/29/2020 EXAM: DUAL X-RAY ABSORPTIOMETRY (DXA) FOR BONE MINERAL DENSITY IMPRESSION: Referring Physician:  Bernerd Limbo Your patient completed a bone mineral density test using GE Lunar iDXA system (analysis version: 16). Technologist: [place] PATIENT: Name: Porscha, Axley Patient ID: [id number] Birth Date: 1945-09-05 Height: 59.0 in. Sex: Female Measured: 11/29/2020 Weight: 148.6 lbs. Indications: Advanced Age, Albuterol, Bilateral Ovariectomy (65.51), Breast Cancer History, Caucasian, Depression, Estrogen Deficient, Hysterectomy, Postmenopausal, Tobacco User (Current Smoker), History of Fracture (Adult) (V15.51) Fractures: Elbow, Right Ankle, Tib/Fib Treatments: Estrace tablet, Vitamin D (E933.5) ASSESSMENT: The BMD measured at Femur Neck Right is 0.898 g/cm2 with a T-score of -1.0. This patient is considered normal according to [place] [facility]) criteria. The quality of the exam is good. The lumbar spine was excluded due to degenerative changes as it was excluded from previous exam. Site Region Measured Date Measured Age YA BMD Significant CHANGE T-score DualFemur Neck Right 11/29/2020 75.9 -1.0 0.898 g/cm2 DualFemur Neck Right 08/12/2017 72.6 -1.0 0.899 g/cm2 DualFemur Total Mean 11/29/2020 75.9 -0.4 0.960 g/cm2 DualFemur Total Mean 08/12/2017 72.6 -0.4 0.957 g/cm2 Right Forearm Radius 33% 11/29/2020 75.9 0.1 0.901 g/cm2 Right Forearm Radius 33% 08/12/2017 72.6 0.0 0.886 g/cm2 World Health Organization [facility]) criteria for post-menopausal, Caucasian Women: Normal       T-score at or above -1 SD Osteopenia   T-score  between -1 and -2.5 SD Osteoporosis T-score at or below -2.5 SD RECOMMENDATION: 1. All patients should optimize calcium and vitamin D intake. 2. Consider FDA-approved medical therapies in postmenopausal women and men aged 60 years and older, based on the following: a. A hip or vertebral (clinical  or morphometric) fracture. b. T-score = -2.5 at the femoral neck or spine after appropriate evaluation to exclude secondary causes. c. Low bone mass (T-score between -1.0 and -2.5 at the femoral neck or spine) and a 10-year probability of a hip fracture = 3% or a 10-year probability of a major osteoporosis-related fracture = 20% based on the [place]-adapted WHO algorithm. d. Clinician judgment and/or patient preferences may indicate treatment for people with 10-year fracture probabilities above or below these levels. FOLLOW-UP: Patients with diagnosis of osteoporosis or at high risk for fracture should have regular bone mineral density tests.? Patients eligible for Medicare are allowed routine testing every 2 years.? The testing frequency can be increased to one year for patients who have rapidly progressing disease, are receiving or discontinuing medical therapy to restore bone mass, or have additional risk factors. I have reviewed this study and agree with the findings. [facility] Electronically Signed   By: Lowella Grip III M.D.   On: 11/29/2020 09:45      ELIGIBLE FOR AVAILABLE RESEARCH PROTOCOL: no  ASSESSMENT: 74 y.o. [name] woman status post left breast upper outer quadrant biopsy 09/27/2020 for a clinical T1c N0, stage IA invasive ductal carcinoma, grade 2, estrogen and progesterone receptor positive, HER-2 not amplified, with an MIB-1 of 5%  (1) status post left lumpectomy 10/21/2020 for a pT1c pNX, stage IA invasive ductal carcinoma, grade 1, with negative margins  (2) adjuvant radiation to be completed 12/31/2018  (3) antiestrogens  (A) bone density 11/29/2020 shows a T score of  -1.0, normal  (B) s/p OCP use w/o complications  (C) s/p hysterectomy  PLAN: Mitzie did very well with her surgery and will complete her radiation treatments early July.  She will be ready then for systemic therapy.  We discussed the difference between tamoxifen and anastrozole in detail. She understands that anastrozole and the aromatase inhibitors in general work by blocking estrogen production. Accordingly vaginal dryness, decrease in bone density, and of course hot flashes can result. The aromatase inhibitors can also negatively affect the cholesterol profile, although that is a minor effect. One out of 5 women on aromatase inhibitors we will feel "old and achy". This arthralgia/myalgia syndrome, which resembles fibromyalgia clinically, does resolve with stopping the medications. Accordingly this is not a reason to not try an aromatase inhibitor but it is a frequent reason to stop it (in other words 20% of women will not be able to tolerate these medications).  Tamoxifen on the other hand does not block estrogen production. It does not "take away a woman's estrogen". It blocks the estrogen receptor in breast cells. Like anastrozole, it can also cause hot flashes. As opposed to anastrozole, tamoxifen has many estrogen-like effects. It is technically an estrogen receptor modulator. This means that in some tissues tamoxifen works like estrogen-- for example it helps strengthen the bones. It tends to improve the cholesterol profile. It can cause thickening of the endometrial lining, and even endometrial polyps or rarely cancer of the uterus.(The risk of uterine cancer due to tamoxifen is one additional cancer per thousand women year). It can cause vaginal wetness or stickiness. It can cause blood clots through this estrogen-like effect--the risk of blood clots with tamoxifen is exactly the same as with birth control pills or hormone replacement.  Neither of these agents causes mood changes or weight gain,  despite the popular belief that they can have these side effects. We have data from studies comparing either of these drugs with placebo, and  in those cases the control group had the same amount of weight gain and depression as the group that took the drug.  Because she took estrogen replacement for many years with no clotting complications and because she is status post hysterectomy I think she is an excellent candidate for tamoxifen.  I have gone ahead and placed that prescription for her.  She will start 01/30/2021.  And plan to see her virtually in September.  If she is tolerating tamoxifen well the plan will be to continue that for 5 years.  I will set her up for repeat mammography in October and I will see her again in November.  After that we will follow her with yearly visits  Total encounter time 30 minutes.Sarajane Jews C. Vaunda Gutterman, MD 12/21/2020 2:36 PM Medical Oncology and Hematology [facility] [place], [place] [address] Tel. [phone]    Fax. [phone]   This document serves as a record of services personally performed by Lurline Del, MD. It was created on his behalf by Wilburn Mylar, a trained medical scribe. The creation of this record is based on the scribe's personal observations and the provider's statements to them.   I, Lurline Del MD, have reviewed the above documentation for accuracy and completeness, and I agree with the above.   *Total Encounter Time as defined by the Centers for Medicare and Medicaid Services includes, in addition to the face-to-face time of a patient visit (documented in the note above) non-face-to-face time: obtaining and reviewing outside history, ordering and reviewing medications, tests or procedures, care coordination (communications with other health care professionals or caregivers) and documentation in the medical record.

## 2020-12-22 ENCOUNTER — Inpatient Hospital Stay: Payer: Medicare Other | Attending: Oncology | Admitting: Oncology

## 2020-12-22 ENCOUNTER — Ambulatory Visit
Admission: RE | Admit: 2020-12-22 | Discharge: 2020-12-22 | Disposition: A | Discharge status: Home / Self Care | Payer: Medicare Other | Source: Ambulatory Visit | Attending: Radiation Oncology | Admitting: Radiation Oncology

## 2020-12-22 VITALS — BP 127/67 | HR 84 | Temp 97.7°F | Resp 16 | Ht 61.0 in | Wt 149.1 lb

## 2020-12-22 DIAGNOSIS — F17210 Nicotine dependence, cigarettes, uncomplicated: Secondary | ICD-10-CM | POA: Diagnosis not present

## 2020-12-22 DIAGNOSIS — C50412 Malignant neoplasm of upper-outer quadrant of left female breast: Secondary | ICD-10-CM | POA: Diagnosis present

## 2020-12-22 DIAGNOSIS — Z170 Estrogen receptor positive status [ER+]: Secondary | ICD-10-CM | POA: Insufficient documentation

## 2020-12-22 DIAGNOSIS — Z90710 Acquired absence of both cervix and uterus: Secondary | ICD-10-CM | POA: Diagnosis not present

## 2020-12-22 MED ORDER — TAMOXIFEN CITRATE 20 MG PO TABS: 20.0000 mg | ORAL_TABLET | Freq: Every day | ORAL | 4 refills | Status: AC

## 2020-12-23 ENCOUNTER — Ambulatory Visit
Admission: RE | Admit: 2020-12-23 | Discharge: 2020-12-23 | Disposition: A | Discharge status: Home / Self Care | Payer: Medicare Other | Source: Ambulatory Visit | Attending: Radiation Oncology | Admitting: Radiation Oncology

## 2020-12-23 ENCOUNTER — Ambulatory Visit: Payer: Medicare Other | Admitting: Radiation Oncology

## 2020-12-23 DIAGNOSIS — C50412 Malignant neoplasm of upper-outer quadrant of left female breast: Secondary | ICD-10-CM | POA: Diagnosis not present

## 2020-12-23 MED ORDER — RADIAPLEXRX EX GEL: Freq: Once | CUTANEOUS | Status: AC

## 2020-12-26 ENCOUNTER — Other Ambulatory Visit: Payer: Self-pay

## 2020-12-26 ENCOUNTER — Ambulatory Visit
Admission: RE | Admit: 2020-12-26 | Discharge: 2020-12-26 | Disposition: A | Discharge status: Home / Self Care | Payer: Medicare Other | Source: Ambulatory Visit | Attending: Radiation Oncology | Admitting: Radiation Oncology

## 2020-12-26 DIAGNOSIS — C50412 Malignant neoplasm of upper-outer quadrant of left female breast: Secondary | ICD-10-CM | POA: Diagnosis not present

## 2020-12-27 ENCOUNTER — Ambulatory Visit
Admission: RE | Admit: 2020-12-27 | Discharge: 2020-12-27 | Disposition: A | Discharge status: Home / Self Care | Payer: Medicare Other | Source: Ambulatory Visit | Attending: Radiation Oncology | Admitting: Radiation Oncology

## 2020-12-27 DIAGNOSIS — C50412 Malignant neoplasm of upper-outer quadrant of left female breast: Secondary | ICD-10-CM | POA: Diagnosis not present

## 2020-12-28 ENCOUNTER — Ambulatory Visit
Admission: RE | Admit: 2020-12-28 | Discharge: 2020-12-28 | Disposition: A | Discharge status: Home / Self Care | Payer: Medicare Other | Source: Ambulatory Visit | Attending: Radiation Oncology | Admitting: Radiation Oncology

## 2020-12-28 ENCOUNTER — Other Ambulatory Visit: Payer: Self-pay

## 2020-12-28 DIAGNOSIS — C50412 Malignant neoplasm of upper-outer quadrant of left female breast: Secondary | ICD-10-CM | POA: Diagnosis not present

## 2020-12-29 ENCOUNTER — Encounter: Payer: Self-pay | Admitting: *Deleted

## 2020-12-29 ENCOUNTER — Ambulatory Visit
Admission: RE | Admit: 2020-12-29 | Discharge: 2020-12-29 | Disposition: A | Discharge status: Home / Self Care | Payer: Medicare Other | Source: Ambulatory Visit | Attending: Radiation Oncology | Admitting: Radiation Oncology

## 2020-12-29 DIAGNOSIS — C50412 Malignant neoplasm of upper-outer quadrant of left female breast: Secondary | ICD-10-CM | POA: Diagnosis not present

## 2020-12-30 ENCOUNTER — Other Ambulatory Visit: Payer: Self-pay

## 2020-12-30 ENCOUNTER — Encounter: Payer: Self-pay | Admitting: Radiation Oncology

## 2020-12-30 ENCOUNTER — Ambulatory Visit
Admission: RE | Admit: 2020-12-30 | Discharge: 2020-12-30 | Disposition: A | Discharge status: Home / Self Care | Payer: Medicare Other | Source: Ambulatory Visit | Attending: Radiation Oncology | Admitting: Radiation Oncology

## 2020-12-30 DIAGNOSIS — Z170 Estrogen receptor positive status [ER+]: Secondary | ICD-10-CM | POA: Insufficient documentation

## 2020-12-30 DIAGNOSIS — C50412 Malignant neoplasm of upper-outer quadrant of left female breast: Secondary | ICD-10-CM | POA: Insufficient documentation

## 2020-12-31 NOTE — Progress Notes (Signed)
                                                                                                                                                             Patient Name: [name] MRN: [id number] DOB: Jan 15, 1946 Referring Physician: Suzanna Obey (Profile Not Attached) Date of Service: 12/30/2020 [facility], [place]                                                        End Of Treatment Note  Diagnoses: C50.412-Malignant neoplasm of upper-outer quadrant of left female breast  Cancer Staging: Stage IA, pT1c, cN0M0 grade 1, ER/PR positive invasive ductal carcinoma of the left breast.  Intent: Curative  Radiation Treatment Dates: 12/05/2020 through 12/30/2020 Site Technique Total Dose (Gy) Dose per Fx (Gy) Completed Fx Beam Energies  Breast, Left: Breast_Lt 3D 42.56/42.56 2.66 16/16 6X, 10X  Breast, Left: Breast_Lt_Bst 3D 10/10 2.5 4/4 6X, 10X   Narrative: The patient tolerated radiation therapy relatively well without complaints of fatigue, but did have anticipated skin changes in the treatment field.  Plan: The patient will receive a call in about one month from the radiation oncology department. She will continue follow up with Dr. Jana Hakim as well.   _______________________________________________    Carola Rhine, [facility]

## 2021-01-02 ENCOUNTER — Encounter: Payer: Self-pay | Admitting: Radiation Oncology

## 2021-01-11 ENCOUNTER — Other Ambulatory Visit: Payer: Self-pay | Admitting: Physician Assistant

## 2021-01-12 NOTE — Progress Notes (Signed)
  Radiation Oncology         [phone] ________________________________  Name: [name] MRN: [id number]  Date: 01/02/2021  DOB: Oct 10, 1945  SIMULATION NOTE   NARRATIVE:  The patient underwent simulation today for ongoing radiation therapy.  The existing CT study set was employed for the purpose of virtual treatment planning.  The target and avoidance structures were reviewed and modified as necessary.  Treatment planning then occurred.  The radiation boost prescription was entered and confirmed.  A total of 3 complex treatment devices were fabricated in the form of multi-leaf collimators to shape radiation around the targets while maximally excluding nearby normal structures. I have requested : Isodose Plan.    PLAN:  This modified radiation beam arrangement is intended to continue the current radiation dose to an additional 10 Gy in 4 fractions for a total cumulative dose of 52.56 Gy.    ------------------------------------------------  [name], MD, PhD

## 2021-01-16 ENCOUNTER — Other Ambulatory Visit: Payer: Self-pay | Admitting: Physician Assistant

## 2021-01-16 ENCOUNTER — Other Ambulatory Visit (HOSPITAL_COMMUNITY): Payer: Self-pay | Admitting: Physician Assistant

## 2021-01-16 DIAGNOSIS — F17200 Nicotine dependence, unspecified, uncomplicated: Secondary | ICD-10-CM

## 2021-01-26 ENCOUNTER — Other Ambulatory Visit: Payer: Self-pay

## 2021-01-26 ENCOUNTER — Ambulatory Visit (HOSPITAL_COMMUNITY)
Admission: RE | Admit: 2021-01-26 | Discharge: 2021-01-26 | Disposition: A | Discharge status: Home / Self Care | Payer: Medicare Other | Source: Ambulatory Visit | Attending: Physician Assistant | Admitting: Physician Assistant

## 2021-01-26 DIAGNOSIS — Z122 Encounter for screening for malignant neoplasm of respiratory organs: Secondary | ICD-10-CM | POA: Diagnosis not present

## 2021-01-26 DIAGNOSIS — F17200 Nicotine dependence, unspecified, uncomplicated: Secondary | ICD-10-CM

## 2021-01-26 DIAGNOSIS — Z136 Encounter for screening for cardiovascular disorders: Secondary | ICD-10-CM | POA: Diagnosis present

## 2021-01-26 DIAGNOSIS — F17210 Nicotine dependence, cigarettes, uncomplicated: Secondary | ICD-10-CM | POA: Diagnosis not present

## 2021-02-06 ENCOUNTER — Ambulatory Visit
Admission: RE | Admit: 2021-02-06 | Discharge: 2021-02-06 | Disposition: A | Discharge status: Home / Self Care | Payer: Medicare Other | Source: Ambulatory Visit | Attending: Radiation Oncology | Admitting: Radiation Oncology

## 2021-02-06 ENCOUNTER — Other Ambulatory Visit: Payer: Self-pay

## 2021-02-06 DIAGNOSIS — Z170 Estrogen receptor positive status [ER+]: Secondary | ICD-10-CM

## 2021-02-06 DIAGNOSIS — C50412 Malignant neoplasm of upper-outer quadrant of left female breast: Secondary | ICD-10-CM

## 2021-02-06 NOTE — Progress Notes (Signed)
  Radiation Oncology         [phone] ________________________________  Name: [name] MRN: [id number]  Date of Service: 02/06/2021  DOB: 28-Feb-1946  Post Treatment Telephone Note  Diagnosis:   Stage IA, pT1c, cN0M0 grade 1, ER/PR positive invasive ductal carcinoma of the left breast.  Interval Since Last Radiation:  6 weeks   12/05/2020 through 12/30/2020 Site Technique Total Dose (Gy) Dose per Fx (Gy) Completed Fx Beam Energies  Breast, Left: Breast_Lt 3D 42.56/42.56 2.66 16/16 6X, 10X  Breast, Left: Breast_Lt_Bst 3D 10/10 2.5 4/4 6X, 10X    Narrative:  The patient was contacted today for routine follow-up. During treatment she did very well with radiotherapy and did not have significant desquamation. She reports she is still very tender around the nipple area despite multiple creams.  Impression/Plan: 1. Stage IA, pT1c, cN0M0 grade 1, ER/PR positive invasive ductal carcinoma of the left breast. The patient has been doing well since completion of radiotherapy. We discussed that we would be happy to continue to follow her as needed, but she will also continue to follow up with Dr. Jana Hakim in medical oncology. She was counseled on skin care as well as measures to avoid sun exposure to this area. I encouraged her to try NSAIDs with more regularity for the next week or two and during this time hold aspirin.  2. Survivorship. We discussed the importance of survivorship evaluation and encouraged her to attend her upcoming visit with that clinic.       Carola Rhine, PAC

## 2021-03-08 ENCOUNTER — Inpatient Hospital Stay: Payer: Medicare Other | Attending: Oncology | Admitting: Oncology

## 2021-03-08 DIAGNOSIS — Z170 Estrogen receptor positive status [ER+]: Secondary | ICD-10-CM | POA: Diagnosis not present

## 2021-03-08 DIAGNOSIS — C50412 Malignant neoplasm of upper-outer quadrant of left female breast: Secondary | ICD-10-CM | POA: Diagnosis not present
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
## 2021-03-10 DIAGNOSIS — R7303 Prediabetes: Secondary | ICD-10-CM | POA: Insufficient documentation

## 2021-04-17 ENCOUNTER — Other Ambulatory Visit: Payer: Self-pay

## 2021-04-17 ENCOUNTER — Ambulatory Visit
Admission: RE | Admit: 2021-04-17 | Discharge: 2021-04-17 | Disposition: A | Discharge status: Home / Self Care | Payer: Medicare Other | Source: Ambulatory Visit | Attending: Oncology | Admitting: Oncology

## 2021-04-17 DIAGNOSIS — Z170 Estrogen receptor positive status [ER+]: Secondary | ICD-10-CM

## 2021-05-09 ENCOUNTER — Telehealth: Payer: Self-pay | Admitting: Oncology

## 2021-05-09 NOTE — Telephone Encounter (Signed)
Sch per 1/12 inbasket,pt aware °

## 2021-05-10 ENCOUNTER — Other Ambulatory Visit: Payer: Medicare Other

## 2021-05-10 ENCOUNTER — Ambulatory Visit: Payer: Medicare Other | Admitting: Oncology

## 2021-06-13 ENCOUNTER — Other Ambulatory Visit: Payer: Self-pay

## 2021-06-13 DIAGNOSIS — Z170 Estrogen receptor positive status [ER+]: Secondary | ICD-10-CM

## 2021-06-13 DIAGNOSIS — C50412 Malignant neoplasm of upper-outer quadrant of left female breast: Secondary | ICD-10-CM

## 2021-06-13 NOTE — Progress Notes (Signed)
[place]  Telephone:([phone] Fax:(336) [phone]     ID: [name] DOB: 1945-09-28  MR#: [id number]  VOH#:[id number]  Patient Care Team: Kelsey Mires, MD as PCP - General (Family Medicine) Kelsey Kaufmann, RN as Oncology Nurse Navigator Kelsey Germany, RN as Oncology Nurse Navigator Kelsey Keens, MD as Consulting Physician (General Surgery) [name], Kelsey Dad, MD as Consulting Physician (Oncology) Kelsey Rudd, MD as Consulting Physician (Radiation Oncology) Kelsey Stalls, MD as Consulting Physician (Ophthalmology) Kelsey Sciara, MD as Referring Physician (Gastroenterology) Kelsey Leavell, MD as Referring Physician (Obstetrics and Gynecology) Kelsey Sine, MD as Consulting Physician (Dermatology) Kelsey Cruel, MD OTHER MD:   CHIEF COMPLAINT: Estrogen receptor positive breast cancer  CURRENT TREATMENT: Tamoxifen   INTERVAL HISTORY: [name] returns today for follow up of her estrogen receptor positive breast cancer.   She started tamoxifen on 01/30/2021.  At present she is having no side effects from it including no hot flashes no vaginal wetness issues.  Since her last visit, she underwent bilateral diagnostic mammography with tomography at [facility] on 04/17/2021 showing: breast density category C; no evidence of malignancy in either breast; benign cyst in upper-outer right breast.    REVIEW OF SYSTEMS: [name] goes bowling and does some pickleball.  She is also very active at church.  A detailed review of systems today was otherwise noncontributory   COVID 19 VACCINATION STATUS: fully vaccinated [employer]), with booster 04/2020   HISTORY OF CURRENT ILLNESS: From the original intake note:  [name] had routine screening mammography on 09/06/2020 showing a possible abnormality in the bilateral breasts. She underwent bilateral diagnostic mammography with tomography and bilateral breast ultrasonography at [facility] on 09/26/2020 showing: breast density category C; 1.1 cm left breast mass at 3 o'clock; benign simple cysts in upper-outer right breast; normal left axillary lymph nodes.  Accordingly on 09/27/2020 she proceeded to biopsy of the left breast area in question. The pathology from this procedure ([id number]) showed: invasive and in situ mammary carcinoma, e-cadherin positive, grade 2. Prognostic indicators significant for: estrogen receptor, 95% positive and progesterone receptor, 95% positive, both with strong staining intensity. Proliferation marker Ki67 at 5%. HER2 equivocal by immunohistochemistry (2+), but negative by fluorescent in situ hybridization with a signals ratio 1.22 and number per cell 1.95.   Cancer Staging  Malignant neoplasm of upper-outer quadrant of left breast in female, estrogen receptor positive ([place]) Staging form: Breast, AJCC 8th Edition - Clinical stage from 10/05/2020: Stage IA (cT1c, cN0, cM0, G2, ER+, PR+, HER2-) - Signed by Kelsey Cruel, MD on 10/05/2020 Stage prefix: Initial diagnosis Method of lymph node assessment: Clinical Histologic grading system: 3 grade system - Pathologic stage from 10/21/2020: Stage IA (pT1c, pN0, cM0, G1, ER+, PR+, HER2-) - Signed by Kelsey Phlegm, NP on 11/02/2020 Histologic grading system: 3 grade system  The patient's subsequent history is as detailed below.   PAST MEDICAL HISTORY: Past Medical History:  Diagnosis Date   Allergy    Ankle fracture 03/16/2017   right   Breast cancer (HCC)    Bulging lumbar disc    L4 OR L5 TAKES STEROID INJECTIONS FOR TOOK INJECTION MARCH 2019   Cancer [facility])    Skin CA, removed   Chronic bronchitis ([place]) FEW YEARS AGO   Chronic hepatitis C ([place])    TOOK [place] 2017    Colon polyp    COPD (chronic obstructive pulmonary disease) ([place])    Diverticulitis 30YRS AGO  Dyspnea    WITH HEAVY EXERTION   Emphysema of lung (HCC)    TOOK PULMONARY FUCTION TEST AND PASSED FEW WEEKS AGO  AT FAMILY  MD   Gallstones    GERD (gastroesophageal reflux disease)    History of kidney stones 20 YRS AGO    Hypertension    Osteoarthritis    Pneumonia AT BIRTH   Spondylosis of cervical region without myelopathy or radiculopathy     PAST SURGICAL HISTORY: Past Surgical History:  Procedure Laterality Date   ABDOMINAL HYSTERECTOMY  1980   PARTIAL   ANKLE ARTHROSCOPY Right 10/02/2017   Procedure: RIGHT ANKLE ARTHROSCOPY WITH EXTENSIVE DEBRIDEMENT;  Surgeon: Kelsey Leitz, MD;  Location: [place];  Service: Orthopedics;  Laterality: Right;  90 MINUTES FOR CASE   BREAST BIOPSY  02/07/2010   BREAST LUMPECTOMY WITH RADIOACTIVE SEED LOCALIZATION Left 10/21/2020   Procedure: LEFT BREAST LUMPECTOMY WITH RADIOACTIVE SEED LOCALIZATION;  Surgeon: Kelsey Keens, MD;  Location: [place];  Service: General;  Laterality: Left;  [place] 2   BREAST SURGERY  2011   biopsy   CHOLECYSTECTOMY  1999   LAPAROSCOPIC   HARDWARE REMOVAL Right 10/02/2017   Procedure: HARDWARE REMOVAL;  Surgeon: Kelsey Leitz, MD;  Location: [place];  Service: Orthopedics;  Laterality: Right;   INJECTION OF SILICONE OIL Left 08/08/2534   Procedure: INJECTION OF SILICONE OIL;  Surgeon: Kelsey Stalls, MD;  Location: [place];  Service: Ophthalmology;  Laterality: Left;   MEMBRANE PEEL Left 01/04/2020   Procedure: MEMBRANE PEEL;  Surgeon: Kelsey Stalls, MD;  Location: [place];  Service: Ophthalmology;  Laterality: Left;   ORIF ANKLE FRACTURE Right 03/20/2017   Procedure: OPEN REDUCTION INTERNAL FIXATION (ORIF) ANKLE FRACTURE;  Surgeon: Kelsey Leitz, MD;  Location: [place];  Service: Orthopedics;  Laterality: Right;   PARS PLANA VITRECTOMY Left 01/04/2020   Procedure: PARS PLANA VITRECTOMY WITH 25 GAUGE;  Surgeon: Kelsey Stalls, MD;  Location: [place];  Service: Ophthalmology;  Laterality: Left;   PHOTOCOAGULATION WITH LASER Left 01/04/2020   Procedure: PHOTOCOAGULATION WITH LASER;   Surgeon: Kelsey Stalls, MD;  Location: [place];  Service: Ophthalmology;  Laterality: Left;   TONSILLECTOMY     TONSILLECTOMY AND ADENOIDECTOMY  35's    FAMILY HISTORY: Family History  Problem Relation Age of Onset   Heart disease Mother    Stroke Brother    Stroke Maternal Uncle    COPD Paternal Uncle    Her father died at age 86 from "old age." Her mother died at age 78 from [place] has one brother and one sister. She reports colon cancer in a paternal cousin (female) in her 28's. There is no family history of breast or ovarian cancer to her knowledge.   GYNECOLOGIC HISTORY:  No LMP recorded. Patient has had a hysterectomy. Menarche: 75 years old [place] P 0 LMP with hysterectomy Contraceptive: previously used without issue HRT used for 6 years  Hysterectomy? yes BSO? no   SOCIAL HISTORY: (updated 09/2020)  [name] is currently retired from working as a Occupational hygienist. She is divorced. She lives at home by herself, with her two cats. She is a current smoker with no plans to quit. She attends [employer].    ADVANCED DIRECTIVES: not in place; intends to name cousin Azucena Kuba as her 25. He lives in [facility] and can be reached at [phone].   HEALTH MAINTENANCE: Social History   Tobacco Use   Smoking status:  Every Day    Packs/day: 1.00    Years: 60.00    Pack years: 60.00    Types: Cigarettes    Start date: 07/20/1960   Smokeless tobacco: Never  Vaping Use   Vaping Use: Former  Substance Use Topics   Alcohol use: Not Currently    Alcohol/week: 0.0 standard drinks    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   Drug use: No     Colonoscopy: 04/2010  PAP:   Bone density: 10/2020, -1.0   Allergies  Allergen Reactions   Chantix [Varenicline]     Makes her feel crazy   Codeine Itching    Current Outpatient Medications  Medication Sig Dispense Refill   albuterol (PROVENTIL HFA;VENTOLIN HFA) 108 (90 BASE) MCG/ACT inhaler Inhale 2 puffs into  the lungs every 6 (six) hours as needed for wheezing or shortness of breath.      amLODipine (NORVASC) 5 MG tablet Take 5 mg by mouth every evening.     aspirin EC 81 MG tablet Take 81 mg by mouth daily. Swallow whole.     atorvastatin (LIPITOR) 20 MG tablet Take 20 mg by mouth every evening.      cholecalciferol (VITAMIN D3) 25 MCG (1000 UNIT) tablet Take 1,000 Units by mouth daily.     fluticasone furoate-vilanterol (BREO ELLIPTA) 200-25 MCG/INH AEPB Inhale 1 puff into the lungs daily.     ipratropium (ATROVENT) 0.06 % nasal spray Place 2 sprays into both nostrils daily as needed for rhinitis.     mirabegron ER (MYRBETRIQ) 50 MG TB24 tablet Take 50 mg by mouth daily.     montelukast (SINGULAIR) 10 MG tablet Take 10 mg by mouth every evening.      oxybutynin (DITROPAN-XL) 5 MG 24 hr tablet Take 5 mg by mouth daily.     PREVIDENT 5000 BOOSTER PLUS 1.1 % PSTE Place 1 application onto teeth daily.     traMADol (ULTRAM) 50 MG tablet Take 1 tablet (50 mg total) by mouth every 6 (six) hours as needed for moderate pain or severe pain. (Patient not taking: Reported on 11/22/2020) 25 tablet 0   No current facility-administered medications for this visit.    OBJECTIVE: White woman in no acute distress  Vitals:   06/14/21 1607  BP: (!) 126/58  Pulse: 82  Resp: 18  Temp: 98.1 F (36.7 C)  SpO2: 98%      Body mass index is 28.44 kg/m.   Wt Readings from Last 3 Encounters:  06/14/21 150 lb 8 oz (68.3 kg)  12/22/20 149 lb 1.6 oz (67.6 kg)  11/22/20 148 lb 9.6 oz (67.4 kg)     ECOG FS:1 - Symptomatic but completely ambulatory  Sclerae unicteric, EOMs intact Wearing a mask No cervical or supraclavicular adenopathy Lungs no rales or rhonchi Heart regular rate and rhythm Abd soft, nontender, positive bowel sounds MSK no focal spinal tenderness, no upper extremity lymphedema Neuro: nonfocal, well oriented, appropriate affect Breasts: The right breast is benign per the left breast is status  postlumpectomy and radiation with no evidence of disease recurrence.  Both axillae are benign  LAB RESULTS:  CMP     Component Value Date/Time   NA 138 10/17/2020 1429   K 4.0 10/17/2020 1429   CL 104 10/17/2020 1429   CO2 24 10/17/2020 1429   GLUCOSE 114 (H) 10/17/2020 1429   BUN 15 10/17/2020 1429   CREATININE 0.99 10/17/2020 1429   CREATININE 0.92 10/05/2020 0839   CALCIUM 9.2 10/17/2020  1429   PROT 7.1 10/17/2020 1429   ALBUMIN 4.1 10/17/2020 1429   AST 22 10/17/2020 1429   AST 16 10/05/2020 0839   ALT 16 10/17/2020 1429   ALT 12 10/05/2020 0839   ALKPHOS 57 10/17/2020 1429   BILITOT 1.2 10/17/2020 1429   BILITOT 0.9 10/05/2020 0839   GFRNONAA 59 (L) 10/17/2020 1429   GFRNONAA >60 10/05/2020 0839    No results found for: TOTALPROTELP, ALBUMINELP, A1GS, A2GS, BETS, BETA2SER, GAMS, MSPIKE, SPEI  Lab Results  Component Value Date   WBC 7.1 06/14/2021   NEUTROABS 4.3 06/14/2021   HGB 13.0 06/14/2021   HCT 38.5 06/14/2021   MCV 92.1 06/14/2021   PLT 283 06/14/2021    No results found for: LABCA2  No components found for: [id number]  No results for input(s): INR in the last 168 hours.  No results found for: LABCA2  No results found for: IOE703  No results found for: JKK938  No results found for: HWE993  No results found for: CA2729  No components found for: HGQUANT  No results found for: CEA1 / No results found for: CEA1   No results found for: AFPTUMOR  No results found for: CHROMOGRNA  No results found for: KPAFRELGTCHN, LAMBDASER, KAPLAMBRATIO (kappa/lambda light chains)  No results found for: HGBA, HGBA2QUANT, HGBFQUANT, HGBSQUAN (Hemoglobinopathy evaluation)   No results found for: LDH  No results found for: IRON, TIBC, IRONPCTSAT (Iron and TIBC)  No results found for: FERRITIN  Urinalysis No results found for: COLORURINE, APPEARANCEUR, LABSPEC, PHURINE, GLUCOSEU, HGBUR, BILIRUBINUR, KETONESUR, PROTEINUR, UROBILINOGEN, NITRITE,  LEUKOCYTESUR   STUDIES: No results found.   ELIGIBLE FOR AVAILABLE RESEARCH PROTOCOL: no  ASSESSMENT: 75 y.o. [place] woman status post left breast upper outer quadrant biopsy 09/27/2020 for a clinical T1c N0, stage IA invasive ductal carcinoma, grade 2, estrogen and progesterone receptor positive, HER-2 not amplified, with an MIB-1 of 5%  (1) status post left lumpectomy 10/21/2020 for a pT1c pNX, stage IA invasive ductal carcinoma, grade 1, with negative margins  (2) adjuvant radiation 12/05/2020 through 12/30/2020 Site Technique Total Dose (Gy) Dose per Fx (Gy) Completed Fx Beam Energies  Breast, Left: Breast_Lt 3D 42.56/42.56 2.66 16/16 6X, 10X  Breast, Left: Breast_Lt_Bst 3D 10/10 2.5 4/4 6X, 10X   (3) tamoxifen started 01/30/2021  (A) bone density 11/29/2020 shows a T score of -1.0, normal  (B) s/p OCP use w/o complications  (C) s/p hysterectomy  (4) lung cancer screening:  (a) CT chest/low-dose 01/26/2021 shows no findings of concern   PLAN: [name] is now a little over half year out from definitive surgery for her breast cancer with no evidence of disease recurrence.  This is very favorable.  She is tolerating tamoxifen remarkably well and the plan is to continue that a minimum of 5 years.  Her primary care physician pointed out that there is an interaction between tamoxifen and Myrbetriq.  That drug can interfere with tamoxifen metabolism to Endoxifen..  There are similar interactions with other CYP D2 D6 inhibitors, especially antidepressants.  However when we have looked for a clinical effect of this interaction we have not been able to document that.  In the landmark studies comparing tamoxifen with aromatase inhibitors we reanalyzed he data to see if the women who took antidepressants had worse outcomes with tamoxifen.  There was absolutely no difference in outcomes; so while there is a theoretical concern, there is no evidence of a clinical effect and given how well [name]  is tolerating tamoxifen I  am comfortable with her continuing on that.    Of course there are alternatives to Myrbetriq that could be considered and she can discuss that with her primary care physician.  At present she seems to be benefiting from that drug as well.  She just saw Dr. Ninfa Linden in November and we will see him again in November 2023.  Accordingly she will return to see [place] in May and likely we will start seeing her on a yearly basis from that point.  We can also facilitate her yearly lung cancer screening after that visit.  She knows to call for any other concerns that may develop for that  Total encounter time 25 minutes.Sarajane Jews C. Makyiah Lie, MD 06/14/2021 4:29 PM Medical Oncology and Hematology [facility] [place], [place] [address] Tel. [phone]    Fax. [phone]   This document serves as a record of services personally performed by Lurline Del, MD. It was created on his behalf by Wilburn Mylar, a trained medical scribe. The creation of this record is based on the scribe's personal observations and the provider's statements to them.   I, Lurline Del MD, have reviewed the above documentation for accuracy and completeness, and I agree with the above.   *Total Encounter Time as defined by the Centers for Medicare and Medicaid Services includes, in addition to the face-to-face time of a patient visit (documented in the note above) non-face-to-face time: obtaining and reviewing outside history, ordering and reviewing medications, tests or procedures, care coordination (communications with other health care professionals or caregivers) and documentation in the medical record.

## 2021-06-14 ENCOUNTER — Inpatient Hospital Stay: Payer: Medicare Other | Attending: Oncology

## 2021-06-14 ENCOUNTER — Inpatient Hospital Stay (HOSPITAL_BASED_OUTPATIENT_CLINIC_OR_DEPARTMENT_OTHER): Payer: Medicare Other | Admitting: Oncology

## 2021-06-14 ENCOUNTER — Other Ambulatory Visit: Payer: Self-pay

## 2021-06-14 VITALS — BP 126/58 | HR 82 | Temp 98.1°F | Resp 18 | Ht 61.0 in | Wt 150.5 lb

## 2021-06-14 DIAGNOSIS — Z170 Estrogen receptor positive status [ER+]: Secondary | ICD-10-CM | POA: Diagnosis not present

## 2021-06-14 DIAGNOSIS — Z85828 Personal history of other malignant neoplasm of skin: Secondary | ICD-10-CM | POA: Diagnosis not present

## 2021-06-14 DIAGNOSIS — Z90710 Acquired absence of both cervix and uterus: Secondary | ICD-10-CM | POA: Diagnosis not present

## 2021-06-14 DIAGNOSIS — Z79810 Long term (current) use of selective estrogen receptor modulators (SERMs): Secondary | ICD-10-CM | POA: Insufficient documentation

## 2021-06-14 DIAGNOSIS — F17210 Nicotine dependence, cigarettes, uncomplicated: Secondary | ICD-10-CM | POA: Diagnosis not present

## 2021-06-14 DIAGNOSIS — C50412 Malignant neoplasm of upper-outer quadrant of left female breast: Secondary | ICD-10-CM | POA: Insufficient documentation

## 2021-06-14 DIAGNOSIS — Z923 Personal history of irradiation: Secondary | ICD-10-CM | POA: Insufficient documentation

## 2021-06-14 LAB — CMP (CANCER CENTER ONLY)
ALT: 11 U/L (ref 0–44)
AST: 19 U/L (ref 15–41)
Albumin: 3.7 g/dL (ref 3.5–5.0)
Alkaline Phosphatase: 49 U/L (ref 38–126)
Anion gap: 8 (ref 5–15)
BUN: 17 mg/dL (ref 8–23)
CO2: 27 mmol/L (ref 22–32)
Calcium: 9.1 mg/dL (ref 8.9–10.3)
Chloride: 109 mmol/L (ref 98–111)
Creatinine: 1.06 mg/dL — ABNORMAL HIGH (ref 0.44–1.00)
GFR, Estimated: 54 mL/min — ABNORMAL LOW (ref >60)
Glucose, Bld: 110 mg/dL — ABNORMAL HIGH (ref 70–99)
Potassium: 4.6 mmol/L (ref 3.5–5.1)
Sodium: 144 mmol/L (ref 135–145)
Total Bilirubin: 0.7 mg/dL (ref 0.3–1.2)
Total Protein: 7 g/dL (ref 6.5–8.1)

## 2021-06-14 LAB — CBC WITH DIFFERENTIAL (CANCER CENTER ONLY)
Abs Immature Granulocytes: 0.03 10*3/uL (ref 0.00–0.07)
Basophils Absolute: 0 10*3/uL (ref 0.0–0.1)
Basophils Relative: 0 %
Eosinophils Absolute: 0.2 10*3/uL (ref 0.0–0.5)
Eosinophils Relative: 3 %
HCT: 38.5 % (ref 36.0–46.0)
Hemoglobin: 13 g/dL (ref 12.0–15.0)
Immature Granulocytes: 0 %
Lymphocytes Relative: 29 %
Lymphs Abs: 2.1 10*3/uL (ref 0.7–4.0)
MCH: 31.1 pg (ref 26.0–34.0)
MCHC: 33.8 g/dL (ref 30.0–36.0)
MCV: 92.1 fL (ref 80.0–100.0)
Monocytes Absolute: 0.6 10*3/uL (ref 0.1–1.0)
Monocytes Relative: 8 %
Neutro Abs: 4.3 10*3/uL (ref 1.7–7.7)
Neutrophils Relative %: 60 %
Platelet Count: 283 10*3/uL (ref 150–400)
RBC: 4.18 MIL/uL (ref 3.87–5.11)
RDW: 13.2 % (ref 11.5–15.5)
WBC Count: 7.1 10*3/uL (ref 4.0–10.5)
nRBC: 0 % (ref 0.0–0.2)

## 2021-06-15 ENCOUNTER — Telehealth: Payer: Self-pay | Admitting: Oncology

## 2021-06-15 NOTE — Telephone Encounter (Signed)
Scheduled appointment per 12/14 los. Patient is aware. 

## 2021-08-30 DIAGNOSIS — H30042 Focal chorioretinal inflammation, macular or paramacular, left eye: Secondary | ICD-10-CM | POA: Diagnosis not present

## 2021-08-30 DIAGNOSIS — H43811 Vitreous degeneration, right eye: Secondary | ICD-10-CM | POA: Diagnosis not present

## 2021-08-30 DIAGNOSIS — H35352 Cystoid macular degeneration, left eye: Secondary | ICD-10-CM | POA: Diagnosis not present

## 2021-08-30 DIAGNOSIS — H31092 Other chorioretinal scars, left eye: Secondary | ICD-10-CM | POA: Diagnosis not present

## 2021-09-13 DIAGNOSIS — I10 Essential (primary) hypertension: Secondary | ICD-10-CM | POA: Diagnosis not present

## 2021-09-13 DIAGNOSIS — R7303 Prediabetes: Secondary | ICD-10-CM | POA: Diagnosis not present

## 2021-09-13 DIAGNOSIS — I700 Atherosclerosis of aorta: Secondary | ICD-10-CM | POA: Diagnosis not present

## 2021-09-13 DIAGNOSIS — E782 Mixed hyperlipidemia: Secondary | ICD-10-CM | POA: Diagnosis not present

## 2021-11-01 DIAGNOSIS — M5416 Radiculopathy, lumbar region: Secondary | ICD-10-CM | POA: Diagnosis not present

## 2021-11-06 DIAGNOSIS — Z1289 Encounter for screening for malignant neoplasm of other sites: Secondary | ICD-10-CM | POA: Diagnosis not present

## 2021-11-06 DIAGNOSIS — B1920 Unspecified viral hepatitis C without hepatic coma: Secondary | ICD-10-CM | POA: Diagnosis not present

## 2021-11-06 DIAGNOSIS — K7469 Other cirrhosis of liver: Secondary | ICD-10-CM | POA: Diagnosis not present

## 2021-11-06 DIAGNOSIS — Z9049 Acquired absence of other specified parts of digestive tract: Secondary | ICD-10-CM | POA: Diagnosis not present

## 2021-11-15 ENCOUNTER — Other Ambulatory Visit: Payer: Self-pay

## 2021-11-15 DIAGNOSIS — Z170 Estrogen receptor positive status [ER+]: Secondary | ICD-10-CM

## 2021-11-16 ENCOUNTER — Inpatient Hospital Stay (HOSPITAL_BASED_OUTPATIENT_CLINIC_OR_DEPARTMENT_OTHER): Payer: Medicare Other | Admitting: Hematology

## 2021-11-16 ENCOUNTER — Inpatient Hospital Stay: Payer: Medicare Other | Attending: Hematology

## 2021-11-16 ENCOUNTER — Encounter: Payer: Self-pay | Admitting: Hematology

## 2021-11-16 VITALS — BP 128/55 | HR 74 | Temp 98.2°F | Resp 19 | Ht 61.0 in | Wt 150.5 lb

## 2021-11-16 DIAGNOSIS — Z170 Estrogen receptor positive status [ER+]: Secondary | ICD-10-CM

## 2021-11-16 DIAGNOSIS — C50412 Malignant neoplasm of upper-outer quadrant of left female breast: Secondary | ICD-10-CM | POA: Insufficient documentation

## 2021-11-16 DIAGNOSIS — F17210 Nicotine dependence, cigarettes, uncomplicated: Secondary | ICD-10-CM

## 2021-11-16 DIAGNOSIS — Z923 Personal history of irradiation: Secondary | ICD-10-CM | POA: Insufficient documentation

## 2021-11-16 DIAGNOSIS — F17200 Nicotine dependence, unspecified, uncomplicated: Secondary | ICD-10-CM | POA: Diagnosis not present

## 2021-11-16 LAB — CBC WITH DIFFERENTIAL (CANCER CENTER ONLY)
Abs Immature Granulocytes: 0.03 10*3/uL (ref 0.00–0.07)
Basophils Absolute: 0 10*3/uL (ref 0.0–0.1)
Basophils Relative: 0 %
Eosinophils Absolute: 0.2 10*3/uL (ref 0.0–0.5)
Eosinophils Relative: 2 %
HCT: 36.6 % (ref 36.0–46.0)
Hemoglobin: 12.6 g/dL (ref 12.0–15.0)
Immature Granulocytes: 0 %
Lymphocytes Relative: 22 %
Lymphs Abs: 1.9 10*3/uL (ref 0.7–4.0)
MCH: 31.4 pg (ref 26.0–34.0)
MCHC: 34.4 g/dL (ref 30.0–36.0)
MCV: 91.3 fL (ref 80.0–100.0)
Monocytes Absolute: 0.6 10*3/uL (ref 0.1–1.0)
Monocytes Relative: 7 %
Neutro Abs: 6 10*3/uL (ref 1.7–7.7)
Neutrophils Relative %: 69 %
Platelet Count: 249 10*3/uL (ref 150–400)
RBC: 4.01 MIL/uL (ref 3.87–5.11)
RDW: 12.4 % (ref 11.5–15.5)
WBC Count: 8.8 10*3/uL (ref 4.0–10.5)
nRBC: 0 % (ref 0.0–0.2)

## 2021-11-16 LAB — CMP (CANCER CENTER ONLY)
ALT: 9 U/L (ref 0–44)
AST: 15 U/L (ref 15–41)
Albumin: 3.9 g/dL (ref 3.5–5.0)
Alkaline Phosphatase: 101 U/L (ref 38–126)
Anion gap: 6 (ref 5–15)
BUN: 24 mg/dL — ABNORMAL HIGH (ref 8–23)
CO2: 27 mmol/L (ref 22–32)
Calcium: 9.1 mg/dL (ref 8.9–10.3)
Chloride: 107 mmol/L (ref 98–111)
Creatinine: 1.45 mg/dL — ABNORMAL HIGH (ref 0.44–1.00)
GFR, Estimated: 37 mL/min — ABNORMAL LOW (ref >60)
Glucose, Bld: 116 mg/dL — ABNORMAL HIGH (ref 70–99)
Potassium: 4.7 mmol/L (ref 3.5–5.1)
Sodium: 140 mmol/L (ref 135–145)
Total Bilirubin: 0.7 mg/dL (ref 0.3–1.2)
Total Protein: 6.9 g/dL (ref 6.5–8.1)

## 2021-11-16 MED ORDER — TAMOXIFEN CITRATE 20 MG PO TABS: 20.0000 mg | ORAL_TABLET | Freq: Every day | ORAL | 1 refills | Status: DC

## 2021-11-16 NOTE — Progress Notes (Signed)
[place]   Telephone:(336) [phone] Fax:(336) [phone]   Clinic Follow up Note   Patient Care Team: Katherina Mires, MD as PCP - General (Family Medicine) Mauro Kaufmann, RN as Oncology Nurse Navigator Rockwell Germany, RN as Oncology Nurse Navigator Coralie Keens, MD as Consulting Physician (General Surgery) Magrinat, Virgie Dad, MD (Inactive) as Consulting Physician (Oncology) Kyung Rudd, MD as Consulting Physician (Radiation Oncology) Sherlynn Stalls, MD as Consulting Physician (Ophthalmology) Lenna Sciara, MD as Referring Physician (Gastroenterology) Damian Leavell, MD as Referring Physician (Obstetrics and Gynecology) Harriett Sine, MD as Consulting Physician (Dermatology)  Date of Service:  11/16/2021  CHIEF COMPLAINT: f/u of left breast cancer  CURRENT THERAPY:  Tamoxifen, started 01/30/21  ASSESSMENT & PLAN:  [name] is a 76 y.o. post-hysterectomy female with   1. Malignant neoplasm of upper-outer quadrant of left breast, stage IA pT1c, cN0, grade 1 -found on screening Spence. S/p left lumpectomy 10/21/20 by Dr. 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 Posterior margin involved by DCIS. -received radiation 12/05/20 - 12/30/20 under Dr. Lisbeth Renshaw. -she was started on tamoxifen on 01/30/21. She is tolerating well with no noticeable side effects. -[name] on 04/17/21 was benign. -she is clinically doing well. Labs reviewed, her creatinine is up to 1.45 today; this appears Kelsey today. Last creatinine at [facility] was WNL at 0.99. I advised her to follow up with her PCP. Physical exam was unremarkable. There is no clinical concern for recurrence.  2. Current Smoker, Lung Cancer Screening -she tells me she loves smoking, has no intention of ever quitting. -She has been screened for lung cancer since 2019. last low-dose screening CT on 01/26/21 was negative. -she is interested in continuing surveillance. I recommend moving to every other  year after this year, since she has had one annually for the last 5 years. She is agreeable.   PLAN: -continue tamoxifen, I refilled today -screening chest CT in 01/2021 -Spence due 04/2022 -lab and f/u with NP in 6 months   No problem-specific Assessment & Plan notes found for this encounter.   SUMMARY OF ONCOLOGIC HISTORY: Oncology History  Malignant neoplasm of upper-outer quadrant of left breast in female, estrogen receptor positive ([place])  09/30/2020 Initial Diagnosis   Malignant neoplasm of upper-outer quadrant of left breast in female, estrogen receptor positive ([place])    10/05/2020 Cancer Staging   Staging form: Breast, AJCC 8th Edition - Clinical stage from 10/05/2020: Stage IA (cT1c, cN0, cM0, G2, ER+, PR+, HER2-) - Signed by Chauncey Cruel, MD on 10/05/2020 Stage prefix: Initial diagnosis Method of lymph node assessment: Clinical Histologic grading system: 3 grade system    10/21/2020 Cancer Staging   Staging form: Breast, AJCC 8th Edition - Pathologic stage from 10/21/2020: Stage IA (pT1c, pN0, cM0, G1, ER+, PR+, HER2-) - Signed by Gardenia Phlegm, NP on 11/02/2020 Histologic grading system: 3 grade system       INTERVAL HISTORY:  [name] is here for a follow up of breast cancer. She was last seen by Dr. Jana Hakim on 06/14/21. She is transferring her care to me with his retirement. She presents to the clinic alone. She reports she is doing well overall, no Kelsey concerns. She denies hot flashes on tamoxifen.   All other systems were reviewed with the patient and are negative.  MEDICAL HISTORY:  Past Medical History:  Diagnosis Date   Allergy    Ankle fracture 03/16/2017   right   Breast cancer ([place])  Bulging lumbar disc    L4 OR L5 TAKES STEROID INJECTIONS FOR TOOK INJECTION MARCH 2019   Cancer [facility])    Skin CA, removed   Chronic bronchitis (HCC) FEW YEARS AGO   Chronic hepatitis C ([place])    TOOK HARVONI [place] 2017    Colon polyp    COPD  (chronic obstructive pulmonary disease) (HCC)    Diverticulitis 52YRS AGO   Dyspnea    WITH HEAVY EXERTION   Emphysema of lung (HCC)    TOOK PULMONARY FUCTION TEST AND PASSED FEW WEEKS AGO AT [facility]ILY  MD   Gallstones    GERD (gastroesophageal reflux disease)    History of kidney stones 20 YRS AGO    Hypertension    Osteoarthritis    Pneumonia AT BIRTH   Spondylosis of cervical region without myelopathy or radiculopathy     SURGICAL HISTORY: Past Surgical History:  Procedure Laterality Date   ABDOMINAL HYSTERECTOMY  1980   PARTIAL   ANKLE ARTHROSCOPY Right 10/02/2017   Procedure: RIGHT ANKLE ARTHROSCOPY WITH EXTENSIVE DEBRIDEMENT;  Surgeon: Dorna Leitz, MD;  Location: [place];  Service: Orthopedics;  Laterality: Right;  90 MINUTES FOR CASE   BREAST BIOPSY  02/07/2010   BREAST LUMPECTOMY WITH RADIOACTIVE SEED LOCALIZATION Left 10/21/2020   Procedure: LEFT BREAST LUMPECTOMY WITH RADIOACTIVE SEED LOCALIZATION;  Surgeon: Coralie Keens, MD;  Location: [place];  Service: General;  Laterality: Left;  [place] 2   BREAST SURGERY  2011   biopsy   CHOLECYSTECTOMY  1999   LAPAROSCOPIC   HARDWARE REMOVAL Right 10/02/2017   Procedure: HARDWARE REMOVAL;  Surgeon: Dorna Leitz, MD;  Location: [place];  Service: Orthopedics;  Laterality: Right;   INJECTION OF SILICONE OIL Left 1/0/2585   Procedure: INJECTION OF SILICONE OIL;  Surgeon: Sherlynn Stalls, MD;  Location: [place];  Service: Ophthalmology;  Laterality: Left;   MEMBRANE PEEL Left 01/04/2020   Procedure: MEMBRANE PEEL;  Surgeon: Sherlynn Stalls, MD;  Location: [place];  Service: Ophthalmology;  Laterality: Left;   ORIF ANKLE FRACTURE Right 03/20/2017   Procedure: OPEN REDUCTION INTERNAL FIXATION (ORIF) ANKLE FRACTURE;  Surgeon: Dorna Leitz, MD;  Location: [place];  Service: Orthopedics;  Laterality: Right;   PARS PLANA VITRECTOMY Left 01/04/2020   Procedure: PARS PLANA VITRECTOMY WITH 25  GAUGE;  Surgeon: Sherlynn Stalls, MD;  Location: [place];  Service: Ophthalmology;  Laterality: Left;   PHOTOCOAGULATION WITH LASER Left 01/04/2020   Procedure: PHOTOCOAGULATION WITH LASER;  Surgeon: Sherlynn Stalls, MD;  Location: [place];  Service: Ophthalmology;  Laterality: Left;   TONSILLECTOMY     TONSILLECTOMY AND ADENOIDECTOMY  1950's    I have reviewed the social history and family history with the patient and they are unchanged from previous note.  ALLERGIES:  is allergic to chantix [varenicline] and codeine.  MEDICATIONS:  Current Outpatient Medications  Medication Sig Dispense Refill   tamoxifen (NOLVADEX) 20 MG tablet Take 1 tablet (20 mg total) by mouth daily. 90 tablet 1   albuterol (PROVENTIL HFA;VENTOLIN HFA) 108 (90 BASE) MCG/ACT inhaler Inhale 2 puffs into the lungs every 6 (six) hours as needed for wheezing or shortness of breath.      amLODipine (NORVASC) 5 MG tablet Take 5 mg by mouth every evening.     aspirin EC 81 MG tablet Take 81 mg by mouth daily. Swallow whole.     atorvastatin (LIPITOR) 20 MG tablet Take 20 mg by mouth every evening.  cholecalciferol (VITAMIN D3) 25 MCG (1000 UNIT) tablet Take 1,000 Units by mouth daily.     fluticasone furoate-vilanterol (BREO ELLIPTA) 200-25 MCG/INH AEPB Inhale 1 puff into the lungs daily.     ipratropium (ATROVENT) 0.06 % nasal spray Place 2 sprays into both nostrils daily as needed for rhinitis.     mirabegron ER (MYRBETRIQ) 50 MG TB24 tablet Take 50 mg by mouth daily.     montelukast (SINGULAIR) 10 MG tablet Take 10 mg by mouth every evening.      oxybutynin (DITROPAN-XL) 5 MG 24 hr tablet Take 5 mg by mouth daily.     PREVIDENT 5000 BOOSTER PLUS 1.1 % PSTE Place 1 application onto teeth daily.     traMADol (ULTRAM) 50 MG tablet Take 1 tablet (50 mg total) by mouth every 6 (six) hours as needed for moderate pain or severe pain. (Patient not taking: Reported on 11/22/2020) 25 tablet 0   No current facility-administered  medications for this visit.    PHYSICAL EXAMINATION: ECOG PERFORMANCE STATUS: 0 - Asymptomatic  Vitals:   11/16/21 1414  BP: (!) 128/55  Pulse: 74  Resp: 19  Temp: 98.2 F (36.8 C)  SpO2: 100%   Wt Readings from Last 3 Encounters:  11/16/21 150 lb 8 oz (68.3 kg)  06/14/21 150 lb 8 oz (68.3 kg)  12/22/20 149 lb 1.6 oz (67.6 kg)     GENERAL:alert, no distress and comfortable SKIN: skin color, texture, turgor are normal, no rashes or significant lesions EYES: normal, Conjunctiva are pink and non-injected, sclera clear  NECK: supple, thyroid normal size, non-tender, without nodularity LYMPH:  no palpable lymphadenopathy in the cervical, axillary LUNGS: clear to auscultation and percussion with normal breathing effort HEART: regular rate & rhythm and no murmurs and no lower extremity edema ABDOMEN:abdomen soft, non-tender and normal bowel sounds Musculoskeletal:no cyanosis of digits and no clubbing  NEURO: alert & oriented x 3 with fluent speech, no focal motor/sensory deficits BREAST: No palpable mass, nodules or adenopathy bilaterally. Breast exam benign.   LABORATORY DATA:  I have reviewed the data as listed    Latest Ref Rng & Units 11/16/2021    1:10 PM 06/14/2021    3:46 PM 10/17/2020    2:29 PM  CBC  WBC 4.0 - 10.5 K/uL 8.8   7.1   8.0    Hemoglobin 12.0 - 15.0 g/dL 12.6   13.0   14.2    Hematocrit 36.0 - 46.0 % 36.6   38.5   41.7    Platelets 150 - 400 K/uL 249   283   273          Latest Ref Rng & Units 11/16/2021    1:10 PM 06/14/2021    3:46 PM 10/17/2020    2:29 PM  CMP  Glucose 70 - 99 mg/dL 116   110   114    BUN 8 - 23 mg/dL 24   17   15     Creatinine 0.44 - 1.00 mg/dL 1.45   1.06   0.99    Sodium 135 - 145 mmol/L 140   144   138    Potassium 3.5 - 5.1 mmol/L 4.7   4.6   4.0    Chloride 98 - 111 mmol/L 107   109   104    CO2 22 - 32 mmol/L 27   27   24     Calcium 8.9 - 10.3 mg/dL 9.1   9.1   9.2  Total Protein 6.5 - 8.1 g/dL 6.9   7.0   7.1     Total Bilirubin 0.3 - 1.2 mg/dL 0.7   0.7   1.2    Alkaline Phos 38 - 126 U/L 101   49   57    AST 15 - 41 U/L 15   19   22     ALT 0 - 44 U/L 9   11   16         RADIOGRAPHIC STUDIES: I have personally reviewed the radiological images as listed and agreed with the findings in the report. No results found.    Orders Placed This Encounter  Procedures   CT CHEST LUNG CA SCREEN LOW DOSE W/O CM    Standing Status:   Future    Standing Expiration Date:   11/17/2022    Order Specific Question:   Preferred Imaging Location?    Answer:   [facility]   All questions were answered. The patient knows to call the clinic with any problems, questions or concerns. No barriers to learning was detected. The total time spent in the appointment was 30 minutes.     Truitt Merle, MD 11/16/2021   I, Wilburn Mylar, am acting as scribe for Truitt Merle, MD.   I have reviewed the above documentation for accuracy and completeness, and I agree with the above.

## 2021-12-11 DIAGNOSIS — H52223 Regular astigmatism, bilateral: Secondary | ICD-10-CM | POA: Diagnosis not present

## 2021-12-11 DIAGNOSIS — H524 Presbyopia: Secondary | ICD-10-CM | POA: Diagnosis not present

## 2021-12-13 DIAGNOSIS — M5416 Radiculopathy, lumbar region: Secondary | ICD-10-CM | POA: Diagnosis not present

## 2022-01-09 ENCOUNTER — Encounter (HOSPITAL_COMMUNITY): Payer: Self-pay

## 2022-01-19 DIAGNOSIS — E875 Hyperkalemia: Secondary | ICD-10-CM | POA: Diagnosis not present

## 2022-01-19 DIAGNOSIS — T148XXA Other injury of unspecified body region, initial encounter: Secondary | ICD-10-CM | POA: Diagnosis not present

## 2022-01-19 DIAGNOSIS — L0390 Cellulitis, unspecified: Secondary | ICD-10-CM | POA: Diagnosis not present

## 2022-01-24 ENCOUNTER — Ambulatory Visit (HOSPITAL_COMMUNITY): Admission: RE | Admit: 2022-01-24 | Payer: Medicare Other | Source: Ambulatory Visit

## 2022-01-26 ENCOUNTER — Other Ambulatory Visit: Payer: Self-pay

## 2022-01-29 ENCOUNTER — Ambulatory Visit (HOSPITAL_COMMUNITY)
Admission: RE | Admit: 2022-01-29 | Discharge: 2022-01-29 | Disposition: A | Discharge status: Home / Self Care | Payer: Medicare Other | Source: Ambulatory Visit | Attending: Hematology | Admitting: Hematology

## 2022-01-29 ENCOUNTER — Ambulatory Visit (HOSPITAL_COMMUNITY): Payer: Medicare Other

## 2022-01-29 DIAGNOSIS — F17210 Nicotine dependence, cigarettes, uncomplicated: Secondary | ICD-10-CM | POA: Insufficient documentation

## 2022-02-13 ENCOUNTER — Other Ambulatory Visit: Payer: Self-pay

## 2022-02-13 DIAGNOSIS — F17210 Nicotine dependence, cigarettes, uncomplicated: Secondary | ICD-10-CM

## 2022-02-14 DIAGNOSIS — K7469 Other cirrhosis of liver: Secondary | ICD-10-CM | POA: Diagnosis not present

## 2022-02-14 DIAGNOSIS — H35352 Cystoid macular degeneration, left eye: Secondary | ICD-10-CM | POA: Diagnosis not present

## 2022-02-14 DIAGNOSIS — H30042 Focal chorioretinal inflammation, macular or paramacular, left eye: Secondary | ICD-10-CM | POA: Diagnosis not present

## 2022-02-14 DIAGNOSIS — H43811 Vitreous degeneration, right eye: Secondary | ICD-10-CM | POA: Diagnosis not present

## 2022-02-14 DIAGNOSIS — B1920 Unspecified viral hepatitis C without hepatic coma: Secondary | ICD-10-CM | POA: Diagnosis not present

## 2022-02-15 DIAGNOSIS — N3281 Overactive bladder: Secondary | ICD-10-CM | POA: Diagnosis not present

## 2022-02-16 ENCOUNTER — Other Ambulatory Visit: Payer: Self-pay

## 2022-03-07 ENCOUNTER — Other Ambulatory Visit: Payer: Self-pay | Admitting: Family Medicine

## 2022-03-07 DIAGNOSIS — Z853 Personal history of malignant neoplasm of breast: Secondary | ICD-10-CM

## 2022-03-07 DIAGNOSIS — Z09 Encounter for follow-up examination after completed treatment for conditions other than malignant neoplasm: Secondary | ICD-10-CM

## 2022-03-07 DIAGNOSIS — Z9889 Other specified postprocedural states: Secondary | ICD-10-CM

## 2022-03-21 DIAGNOSIS — R7303 Prediabetes: Secondary | ICD-10-CM | POA: Diagnosis not present

## 2022-03-21 DIAGNOSIS — F17200 Nicotine dependence, unspecified, uncomplicated: Secondary | ICD-10-CM | POA: Diagnosis not present

## 2022-03-21 DIAGNOSIS — E782 Mixed hyperlipidemia: Secondary | ICD-10-CM | POA: Diagnosis not present

## 2022-03-21 DIAGNOSIS — I10 Essential (primary) hypertension: Secondary | ICD-10-CM | POA: Diagnosis not present

## 2022-03-28 DIAGNOSIS — N3281 Overactive bladder: Secondary | ICD-10-CM | POA: Diagnosis not present

## 2022-04-09 DIAGNOSIS — B1920 Unspecified viral hepatitis C without hepatic coma: Secondary | ICD-10-CM | POA: Diagnosis not present

## 2022-04-09 DIAGNOSIS — Z0000 Encounter for general adult medical examination without abnormal findings: Secondary | ICD-10-CM | POA: Diagnosis not present

## 2022-04-09 DIAGNOSIS — F17219 Nicotine dependence, cigarettes, with unspecified nicotine-induced disorders: Secondary | ICD-10-CM | POA: Diagnosis not present

## 2022-04-09 DIAGNOSIS — K7469 Other cirrhosis of liver: Secondary | ICD-10-CM | POA: Diagnosis not present

## 2022-04-17 DIAGNOSIS — M5416 Radiculopathy, lumbar region: Secondary | ICD-10-CM | POA: Diagnosis not present

## 2022-04-18 DIAGNOSIS — H43811 Vitreous degeneration, right eye: Secondary | ICD-10-CM | POA: Diagnosis not present

## 2022-04-18 DIAGNOSIS — H30042 Focal chorioretinal inflammation, macular or paramacular, left eye: Secondary | ICD-10-CM | POA: Diagnosis not present

## 2022-04-18 DIAGNOSIS — H26492 Other secondary cataract, left eye: Secondary | ICD-10-CM | POA: Diagnosis not present

## 2022-04-18 DIAGNOSIS — H35352 Cystoid macular degeneration, left eye: Secondary | ICD-10-CM | POA: Diagnosis not present

## 2022-04-19 ENCOUNTER — Ambulatory Visit
Admission: RE | Admit: 2022-04-19 | Discharge: 2022-04-19 | Disposition: A | Discharge status: Home / Self Care | Payer: Medicare Other | Source: Ambulatory Visit | Attending: Family Medicine | Admitting: Family Medicine

## 2022-04-19 DIAGNOSIS — Z853 Personal history of malignant neoplasm of breast: Secondary | ICD-10-CM | POA: Diagnosis not present

## 2022-04-19 DIAGNOSIS — R92323 Mammographic fibroglandular density, bilateral breasts: Secondary | ICD-10-CM | POA: Diagnosis not present

## 2022-04-19 DIAGNOSIS — Z9889 Other specified postprocedural states: Secondary | ICD-10-CM

## 2022-05-07 ENCOUNTER — Other Ambulatory Visit: Payer: Self-pay | Admitting: Hematology

## 2022-05-08 DIAGNOSIS — M5416 Radiculopathy, lumbar region: Secondary | ICD-10-CM | POA: Diagnosis not present

## 2022-05-11 DIAGNOSIS — Z1289 Encounter for screening for malignant neoplasm of other sites: Secondary | ICD-10-CM | POA: Diagnosis not present

## 2022-05-11 DIAGNOSIS — K7469 Other cirrhosis of liver: Secondary | ICD-10-CM | POA: Diagnosis not present

## 2022-05-11 DIAGNOSIS — B1920 Unspecified viral hepatitis C without hepatic coma: Secondary | ICD-10-CM | POA: Diagnosis not present

## 2022-05-22 NOTE — Progress Notes (Deleted)
[place]   Telephone:([phone] Fax:(336) [phone]   Clinic Follow up Note   Patient Care Team: Katherina Mires, MD as PCP - General (Family Medicine) Mauro Kaufmann, RN as Oncology Nurse Navigator Rockwell Germany, RN as Oncology Nurse Navigator Coralie Keens, MD as Consulting Physician (General Surgery) Kyung Rudd, MD as Consulting Physician (Radiation Oncology) Sherlynn Stalls, MD as Consulting Physician (Ophthalmology) Lenna Sciara, MD as Referring Physician (Gastroenterology) Damian Leavell, MD as Referring Physician (Obstetrics and Gynecology) Harriett Sine, MD as Consulting Physician (Dermatology) Truitt Merle, MD as Attending Physician (Hematology and Oncology) 05/22/2022  CHIEF COMPLAINT: Follow-up left breast cancer  SUMMARY OF ONCOLOGIC HISTORY: Oncology History  Malignant neoplasm of upper-outer quadrant of left breast in female, estrogen receptor positive ([place])  09/30/2020 Initial Diagnosis   Malignant neoplasm of upper-outer quadrant of left breast in female, estrogen receptor positive ([place])   10/05/2020 Cancer Staging   Staging form: Breast, AJCC 8th Edition - Clinical stage from 10/05/2020: Stage IA (cT1c, cN0, cM0, G2, ER+, PR+, HER2-) - Signed by Chauncey Cruel, MD on 10/05/2020 Stage prefix: Initial diagnosis Method of lymph node assessment: Clinical Histologic grading system: 3 grade system   10/21/2020 Cancer Staging   Staging form: Breast, AJCC 8th Edition - Pathologic stage from 10/21/2020: Stage IA (pT1c, pN0, cM0, G1, ER+, PR+, HER2-) - Signed by Gardenia Phlegm, NP on 11/02/2020 Histologic grading system: 3 grade system     CURRENT THERAPY: Tamoxifen, started 01/30/2021  INTERVAL HISTORY: Ms. Gignac returns for follow-up as scheduled, last seen by Dr. Burr Medico 11/16/2021, mammogram 04/19/2022 was benign.  She continues tamoxifen.   REVIEW OF SYSTEMS:   Constitutional: Denies fevers, chills or abnormal weight  loss Eyes: Denies blurriness of vision Ears, nose, mouth, throat, and face: Denies mucositis or sore throat Respiratory: Denies cough, dyspnea or wheezes Cardiovascular: Denies palpitation, chest discomfort or lower extremity swelling Gastrointestinal:  Denies nausea, heartburn or change in bowel habits Skin: Denies abnormal skin rashes Lymphatics: Denies new lymphadenopathy or easy bruising Neurological:Denies numbness, tingling or new weaknesses Behavioral/Psych: Mood is stable, no new changes  All other systems were reviewed with the patient and are negative.  MEDICAL HISTORY:  Past Medical History:  Diagnosis Date   Allergy    Ankle fracture 03/16/2017   right   Breast cancer (HCC)    Bulging lumbar disc    L4 OR L5 TAKES STEROID INJECTIONS FOR TOOK INJECTION MARCH 2019   Cancer ([place])    Skin CA, removed   Chronic bronchitis (HCC) FEW YEARS AGO   Chronic hepatitis C ([place])    TOOK [place]VON[place] 2017    Colon polyp    COPD (chronic obstructive pulmonary disease) (HCC)    Diverticulitis 44YRS AGO   Dyspnea    WITH HEAVY EXERTION   Emphysema of lung (HCC)    TOOK PULMONARY FUCTION TEST AND PASSED FEW WEEKS AGO AT FAMILY  MD   Gallstones    GERD (gastroesophageal reflux disease)    History of kidney stones 20 YRS AGO    Hypertension    Osteoarthritis    Pneumonia AT BIRTH   Spondylosis of cervical region without myelopathy or radiculopathy     SURGICAL HISTORY: Past Surgical History:  Procedure Laterality Date   ABDOMINAL HYSTERECTOMY  1980   PARTIAL   ANKLE ARTHROSCOPY Right 10/02/2017   Procedure: RIGHT ANKLE ARTHROSCOPY WITH EXTENSIVE DEBRIDEMENT;  Surgeon: Dorna Leitz, MD;  Location: [place];  Service: Orthopedics;  Laterality:  Right;  90 MINUTES FOR CASE   BREAST BIOPSY  02/07/2010   BREAST LUMPECTOMY WITH RADIOACTIVE SEED LOCALIZATION Left 10/21/2020   Procedure: LEFT BREAST LUMPECTOMY WITH RADIOACTIVE SEED LOCALIZATION;  Surgeon: Coralie Keens, MD;  Location: [place];  Service: General;  Laterality: Left;  [place] 2   BREAST SURGERY  2011   biopsy   CHOLECYSTECTOMY  1999   LAPAROSCOPIC   HARDWARE REMOVAL Right 10/02/2017   Procedure: HARDWARE REMOVAL;  Surgeon: Dorna Leitz, MD;  Location: [place];  Service: Orthopedics;  Laterality: Right;   INJECTION OF SILICONE OIL Left 11/03/7320   Procedure: INJECTION OF SILICONE OIL;  Surgeon: Sherlynn Stalls, MD;  Location: [place];  Service: Ophthalmology;  Laterality: Left;   MEMBRANE PEEL Left 01/04/2020   Procedure: MEMBRANE PEEL;  Surgeon: Sherlynn Stalls, MD;  Location: [place];  Service: Ophthalmology;  Laterality: Left;   ORIF ANKLE FRACTURE Right 03/20/2017   Procedure: OPEN REDUCTION INTERNAL FIXATION (ORIF) ANKLE FRACTURE;  Surgeon: Dorna Leitz, MD;  Location: [place];  Service: Orthopedics;  Laterality: Right;   PARS PLANA VITRECTOMY Left 01/04/2020   Procedure: PARS PLANA VITRECTOMY WITH 25 GAUGE;  Surgeon: Sherlynn Stalls, MD;  Location: [place];  Service: Ophthalmology;  Laterality: Left;   PHOTOCOAGULATION WITH LASER Left 01/04/2020   Procedure: PHOTOCOAGULATION WITH LASER;  Surgeon: Sherlynn Stalls, MD;  Location: [place];  Service: Ophthalmology;  Laterality: Left;   TONSILLECTOMY     TONSILLECTOMY AND ADENOIDECTOMY  1950's    I have reviewed the social history and family history with the patient and they are unchanged from previous note.  ALLERGIES:  is allergic to chantix [varenicline] and codeine.  MEDICATIONS:  Current Outpatient Medications  Medication Sig Dispense Refill   albuterol (PROVENTIL HFA;VENTOLIN HFA) 108 (90 BASE) MCG/ACT inhaler Inhale 2 puffs into the lungs every 6 (six) hours as needed for wheezing or shortness of breath.      amLODipine (NORVASC) 5 MG tablet Take 5 mg by mouth every evening.     aspirin EC 81 MG tablet Take 81 mg by mouth daily. Swallow whole.     atorvastatin (LIPITOR) 20 MG tablet Take 20 mg by  mouth every evening.      cholecalciferol (VITAMIN D3) 25 MCG (1000 UNIT) tablet Take 1,000 Units by mouth daily.     fluticasone furoate-vilanterol (BREO ELLIPTA) 200-25 MCG/INH AEPB Inhale 1 puff into the lungs daily.     ipratropium (ATROVENT) 0.06 % nasal spray Place 2 sprays into both nostrils daily as needed for rhinitis.     mirabegron ER (MYRBETRIQ) 50 MG TB24 tablet Take 50 mg by mouth daily.     montelukast (SINGULAIR) 10 MG tablet Take 10 mg by mouth every evening.      oxybutynin (DITROPAN-XL) 5 MG 24 hr tablet Take 5 mg by mouth daily.     PREVIDENT 5000 BOOSTER PLUS 1.1 % PSTE Place 1 application onto teeth daily.     tamoxifen (NOLVADEX) 20 MG tablet Take 1 tablet by mouth once daily 90 tablet 0   traMADol (ULTRAM) 50 MG tablet Take 1 tablet (50 mg total) by mouth every 6 (six) hours as needed for moderate pain or severe pain. (Patient not taking: Reported on 11/22/2020) 25 tablet 0   No current facility-administered medications for this visit.    PHYSICAL EXAMINATION: ECOG PERFORMANCE STATUS: {CHL ONC ECOG PS:[phone]}  There were no vitals filed for this visit. There were no vitals filed for this  visit.  GENERAL:alert, no distress and comfortable SKIN: skin color, texture, turgor are normal, no rashes or significant lesions EYES: normal, Conjunctiva are pink and non-injected, sclera clear OROPHARYNX:no exudate, no erythema and lips, buccal mucosa, and tongue normal  NECK: supple, thyroid normal size, non-tender, without nodularity LYMPH:  no palpable lymphadenopathy in the cervical, axillary or inguinal LUNGS: clear to auscultation and percussion with normal breathing effort HEART: regular rate & rhythm and no murmurs and no lower extremity edema ABDOMEN:abdomen soft, non-tender and normal bowel sounds Musculoskeletal:no cyanosis of digits and no clubbing  NEURO: alert & oriented x 3 with fluent speech, no focal motor/sensory deficits  LABORATORY DATA:  I have  reviewed the data as listed    Latest Ref Rng & Units 11/16/2021    1:10 PM 06/14/2021    3:46 PM 10/17/2020    2:29 PM  CBC  WBC 4.0 - 10.5 K/uL 8.8  7.1  8.0   Hemoglobin 12.0 - 15.0 g/dL 12.6  13.0  14.2   Hematocrit 36.0 - 46.0 % 36.6  38.5  41.7   Platelets 150 - 400 K/uL 249  283  273         Latest Ref Rng & Units 11/16/2021    1:10 PM 06/14/2021    3:46 PM 10/17/2020    2:29 PM  CMP  Glucose 70 - 99 mg/dL 116  110  114   BUN 8 - 23 mg/dL _0 Creatinine 0.44 - 1.00 mg/dL 1.45  1.06  0.99   Sodium 135 - 145 mmol/L 140  144  138   Potassium 3.5 - 5.1 mmol/L 4.7  4.6  4.0   Chloride 98 - 111 mmol/L 107  109  104   CO2 22 - 32 mmol/L _1 Calcium 8.9 - 10.3 mg/dL 9.1  9.1  9.2   Total Protein 6.5 - 8.1 g/dL 6.9  7.0  7.1   Total Bilirubin 0.3 - 1.2 mg/dL 0.7  0.7  1.2   Alkaline Phos 38 - 126 U/L 101  49  57   AST 15 - 41 U/L _2 ALT 0 - 44 U/L _3 RADIOGRAPHIC STUDIES: I have personally reviewed the radiological images as listed and agreed with the findings in the report. No results found.   ASSESSMENT & PLAN:  No problem-specific Assessment & Plan notes found for this encounter.   No orders of the defined types were placed in this encounter.  All questions were answered. The patient knows to call the clinic with any problems, questions or concerns. No barriers to learning was detected. I spent {CHL ONC TIME VISIT - LMBEM:[id number]} counseling the patient face to face. The total time spent in the appointment was {CHL ONC TIME VISIT - FHQRF:[id number]} and more than 50% was on counseling and review of test results     Alla Feeling, NP 05/22/22

## 2022-05-23 ENCOUNTER — Other Ambulatory Visit: Payer: Self-pay

## 2022-05-23 ENCOUNTER — Telehealth: Payer: Self-pay

## 2022-05-23 ENCOUNTER — Inpatient Hospital Stay: Payer: Medicare Other | Attending: Nurse Practitioner

## 2022-05-23 ENCOUNTER — Encounter: Payer: Self-pay | Admitting: Nurse Practitioner

## 2022-05-23 ENCOUNTER — Inpatient Hospital Stay: Payer: Medicare Other | Admitting: Nurse Practitioner

## 2022-05-23 DIAGNOSIS — C50412 Malignant neoplasm of upper-outer quadrant of left female breast: Secondary | ICD-10-CM | POA: Diagnosis not present

## 2022-05-23 DIAGNOSIS — J449 Chronic obstructive pulmonary disease, unspecified: Secondary | ICD-10-CM

## 2022-05-23 DIAGNOSIS — F17210 Nicotine dependence, cigarettes, uncomplicated: Secondary | ICD-10-CM

## 2022-05-23 DIAGNOSIS — Z170 Estrogen receptor positive status [ER+]: Secondary | ICD-10-CM

## 2022-05-23 LAB — CBC WITH DIFFERENTIAL (CANCER CENTER ONLY)
Abs Immature Granulocytes: 0.02 10*3/uL (ref 0.00–0.07)
Basophils Absolute: 0 10*3/uL (ref 0.0–0.1)
Basophils Relative: 1 %
Eosinophils Absolute: 0.2 10*3/uL (ref 0.0–0.5)
Eosinophils Relative: 3 %
HCT: 38.2 % (ref 36.0–46.0)
Hemoglobin: 12.8 g/dL (ref 12.0–15.0)
Immature Granulocytes: 0 %
Lymphocytes Relative: 29 %
Lymphs Abs: 2.1 10*3/uL (ref 0.7–4.0)
MCH: 31.4 pg (ref 26.0–34.0)
MCHC: 33.5 g/dL (ref 30.0–36.0)
MCV: 93.9 fL (ref 80.0–100.0)
Monocytes Absolute: 0.5 10*3/uL (ref 0.1–1.0)
Monocytes Relative: 7 %
Neutro Abs: 4.4 10*3/uL (ref 1.7–7.7)
Neutrophils Relative %: 60 %
Platelet Count: 251 10*3/uL (ref 150–400)
RBC: 4.07 MIL/uL (ref 3.87–5.11)
RDW: 12.6 % (ref 11.5–15.5)
WBC Count: 7.4 10*3/uL (ref 4.0–10.5)
nRBC: 0 % (ref 0.0–0.2)

## 2022-05-23 LAB — CMP (CANCER CENTER ONLY)
ALT: 11 U/L (ref 0–44)
AST: 18 U/L (ref 15–41)
Albumin: 3.6 g/dL (ref 3.5–5.0)
Alkaline Phosphatase: 41 U/L (ref 38–126)
Anion gap: 8 (ref 5–15)
BUN: 24 mg/dL — ABNORMAL HIGH (ref 8–23)
CO2: 26 mmol/L (ref 22–32)
Calcium: 8.8 mg/dL — ABNORMAL LOW (ref 8.9–10.3)
Chloride: 107 mmol/L (ref 98–111)
Creatinine: 1.07 mg/dL — ABNORMAL HIGH (ref 0.44–1.00)
GFR, Estimated: 53 mL/min — ABNORMAL LOW (ref >60)
Glucose, Bld: 122 mg/dL — ABNORMAL HIGH (ref 70–99)
Potassium: 4.8 mmol/L (ref 3.5–5.1)
Sodium: 141 mmol/L (ref 135–145)
Total Bilirubin: 0.5 mg/dL (ref 0.3–1.2)
Total Protein: 7.2 g/dL (ref 6.5–8.1)

## 2022-05-23 NOTE — Telephone Encounter (Signed)
Called patient she showed up for labs and left without seeing Cira Rue NP, Sent message to scheduling to reschedule it for another day.

## 2022-05-23 NOTE — Progress Notes (Signed)
New referrals sent to [facility] - Dr. Lamonte Sakai for COPD management and to [facility] for Lung Cancer Screening since pt is an active smoker.  Faxed over referral to [phone]  [phone].  Fax confirmation received.

## 2022-06-05 NOTE — Progress Notes (Unsigned)
[place]   [phone] [phone]   Clinic Follow up Note   Patient Care Team: Kelsey Mires, MD as PCP - General (Family Medicine) Kelsey Kaufmann, RN as Oncology Nurse Navigator Kelsey Germany, RN as Oncology Nurse Navigator Kelsey Keens, MD as Consulting Physician (General Surgery) Kelsey Rudd, MD as Consulting Physician (Radiation Oncology) Kelsey Stalls, MD as Consulting Physician (Ophthalmology) Kelsey Sciara, MD as Referring Physician (Gastroenterology) Kelsey Leavell, MD as Referring Physician (Obstetrics and Gynecology) Kelsey Sine, MD as Consulting Physician (Dermatology) Kelsey Merle, MD as Attending Physician (Hematology and Oncology) 06/06/2022  CHIEF COMPLAINT: Follow up left breast cancer   CURRENT THERAPY: Tamoxifen, started 01/30/21  INTERVAL HISTORY: [name] returns for follow up, previously scheduled 11/22 and she came in for lab but was not aware she had an appointment. Last seen by [name] 11/2021. Mammogram 04/19/22 was benign.  Been playing pickle ball recently, active.  She continues tamoxifen, tolerating well except moderate hot flashes.  These have improved over time.  Denies bone or joint pain.  She has occasional soreness in the left breast since surgery, denies new lump/mass, nipple discharge or inversion, or skin change. Denies recent infection, bleeding, pain, dyspnea, or any other new specific complaints.  He continues smoking, not strongly motivated to quit but open to suggestions.  All other systems were reviewed with the patient and are negative.  MEDICAL HISTORY:  Past Medical History:  Diagnosis Date   Allergy    Ankle fracture 03/16/2017   right   Breast cancer (HCC)    Bulging lumbar disc    L4 OR L5 TAKES STEROID INJECTIONS FOR TOOK INJECTION MARCH 2019   Cancer ([place])    Skin CA, removed   Chronic bronchitis (HCC) FEW YEARS AGO   Chronic hepatitis C ([place])    TOOK HARVONI [place] 2017     Colon polyp    COPD (chronic obstructive pulmonary disease) (HCC)    Diverticulitis 19YRS AGO   Dyspnea    WITH HEAVY EXERTION   Emphysema of lung (HCC)    TOOK PULMONARY FUCTION TEST AND PASSED FEW WEEKS AGO AT [facility]  [facility]   Gallstones    GERD (gastroesophageal reflux disease)    History of kidney stones 20 YRS AGO    Hypertension    Osteoarthritis    Pneumonia AT BIRTH   Spondylosis of cervical region without myelopathy or radiculopathy     SURGICAL HISTORY: Past Surgical History:  Procedure Laterality Date   ABDOMINAL HYSTERECTOMY  1980   PARTIAL   ANKLE ARTHROSCOPY Right 10/02/2017   Procedure: RIGHT ANKLE ARTHROSCOPY WITH EXTENSIVE DEBRIDEMENT;  Surgeon: Dorna Leitz, MD;  Location: [facility];  Service: Orthopedics;  Laterality: Right;  90 MINUTES FOR CASE   BREAST BIOPSY  02/07/2010   BREAST LUMPECTOMY WITH RADIOACTIVE SEED LOCALIZATION Left 10/21/2020   Procedure: LEFT BREAST LUMPECTOMY WITH RADIOACTIVE SEED LOCALIZATION;  Surgeon: Kelsey Keens, MD;  Location: [facility];  Service: General;  Laterality: Left;  [place] 2   BREAST SURGERY  2011   biopsy   CHOLECYSTECTOMY  1999   LAPAROSCOPIC   HARDWARE REMOVAL Right 10/02/2017   Procedure: HARDWARE REMOVAL;  Surgeon: Dorna Leitz, MD;  Location: [facility];  Service: Orthopedics;  Laterality: Right;   INJECTION OF SILICONE OIL Left 08/08/7822   Procedure: INJECTION OF SILICONE OIL;  Surgeon: Kelsey Stalls, MD;  Location: [facility];  Service: Ophthalmology;  Laterality: Left;   MEMBRANE PEEL  Left 01/04/2020   Procedure: MEMBRANE PEEL;  Surgeon: Kelsey Stalls, MD;  Location: [facility];  Service: Ophthalmology;  Laterality: Left;   ORIF ANKLE FRACTURE Right 03/20/2017   Procedure: OPEN REDUCTION INTERNAL FIXATION (ORIF) ANKLE FRACTURE;  Surgeon: Dorna Leitz, MD;  Location: [facility];  Service: Orthopedics;  Laterality: Right;   PARS PLANA VITRECTOMY Left 01/04/2020   Procedure: PARS  PLANA VITRECTOMY WITH 25 GAUGE;  Surgeon: Kelsey Stalls, MD;  Location: [facility];  Service: Ophthalmology;  Laterality: Left;   PHOTOCOAGULATION WITH LASER Left 01/04/2020   Procedure: PHOTOCOAGULATION WITH LASER;  Surgeon: Kelsey Stalls, MD;  Location: [facility];  Service: Ophthalmology;  Laterality: Left;   TONSILLECTOMY     TONSILLECTOMY AND ADENOIDECTOMY  1950's    I have reviewed the social history and family history with the patient and they are unchanged from previous note.  ALLERGIES:  is allergic to chantix [varenicline] and codeine.  MEDICATIONS:  Current Outpatient Medications  Medication Sig Dispense Refill   albuterol (PROVENTIL HFA;VENTOLIN HFA) 108 (90 BASE) MCG/ACT inhaler Inhale 2 puffs into the lungs every 6 (six) hours as needed for wheezing or shortness of breath.      amLODipine (NORVASC) 5 MG tablet Take 5 mg by mouth every evening.     aspirin EC 81 MG tablet Take 81 mg by mouth daily. Swallow whole.     atorvastatin (LIPITOR) 20 MG tablet Take 20 mg by mouth every evening.      Bacillus Coagulans-Inulin (ALIGN PREBIOTIC-PROBIOTIC PO) Take 1 capsule by mouth daily.     cholecalciferol (VITAMIN D3) 25 MCG (1000 UNIT) tablet Take 1,000 Units by mouth daily.     fluticasone furoate-vilanterol (BREO ELLIPTA) 200-25 MCG/INH AEPB Inhale 1 puff into the lungs daily.     GEMTESA 75 MG TABS Take 1 tablet by mouth daily.     ipratropium (ATROVENT) 0.06 % nasal spray Place 2 sprays into both nostrils daily as needed for rhinitis.     montelukast (SINGULAIR) 10 MG tablet Take 10 mg by mouth every evening.      PREVIDENT 5000 BOOSTER PLUS 1.1 % PSTE Place 1 application onto teeth daily.     tamoxifen (NOLVADEX) 20 MG tablet Take 1 tablet (20 mg total) by mouth daily. 90 tablet 3   No current facility-administered medications for this visit.    PHYSICAL EXAMINATION: ECOG PERFORMANCE STATUS: 0 - Asymptomatic  Vitals:   06/06/22 1006  BP: (!) 150/59  Pulse: 65  Resp: 18  Temp:  98.2 F (36.8 C)  SpO2: 99%   Filed Weights   06/06/22 1006  Weight: 150 lb 4 oz (68.2 kg)    GENERAL:alert, no distress and comfortable SKIN: no rash EYES: sclera clear NECK: without mass  LYMPH:  no palpable cervical or supraclavicular lymphadenopathy LUNGS: normal breathing effort HEART: no lower extremity edema NEURO: alert & oriented x 3 with fluent speech, no focal motor/sensory deficits Breast exam: Symmetrical without nipple discharge or inversion.  S/p left lumpectomy and radiation, incisions completely healed with moderate scar tissue.  No palpable mass or nodularity in either breast or axilla that I could appreciate  LABORATORY DATA:  I have reviewed the data as listed    Latest Ref Rng & Units 05/23/2022    9:36 AM 11/16/2021    1:10 PM 06/14/2021    3:46 PM  CBC  WBC 4.0 - 10.5 K/uL 7.4  8.8  7.1   Hemoglobin 12.0 - 15.0 g/dL 12.8  12.6  13.0  Hematocrit 36.0 - 46.0 % 38.2  36.6  38.5   Platelets 150 - 400 K/uL 251  249  283         Latest Ref Rng & Units 05/23/2022    9:36 AM 11/16/2021    1:10 PM 06/14/2021    3:46 PM  CMP  Glucose 70 - 99 mg/dL 122  116  110   BUN 8 - 23 mg/dL '24  24  17   '$ Creatinine 0.44 - 1.00 mg/dL 1.07  1.45  1.06   Sodium 135 - 145 mmol/L 141  140  144   Potassium 3.5 - 5.1 mmol/L 4.8  4.7  4.6   Chloride 98 - 111 mmol/L 107  107  109   CO2 22 - 32 mmol/L '26  27  27   '$ Calcium 8.9 - 10.3 mg/dL 8.8  9.1  9.1   Total Protein 6.5 - 8.1 g/dL 7.2  6.9  7.0   Total Bilirubin 0.3 - 1.2 mg/dL 0.5  0.7  0.7   Alkaline Phos 38 - 126 U/L 41  101  49   AST 15 - 41 U/L '18  15  19   '$ ALT 0 - 44 U/L '11  9  11       '$ RADIOGRAPHIC STUDIES: I have personally reviewed the radiological images as listed and agreed with the findings in the report. No results found.   ASSESSMENT & PLAN: [name] is a 76 y.o. post-hysterectomy female with    1. Malignant neoplasm of upper-outer quadrant of left breast, stage IA pT1c, cN0, grade  1 -Diagnosed 08/2020 s/p left lumpectomy 10/21/20 by Dr. 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 Posterior margin involved by DCIS. -S/p adjuvant radiation 12/05/20 - 12/30/20 under Dr. Lisbeth Renshaw. -she was started on tamoxifen on 01/30/21, goal 5 years -Last mammogram 04/2022 was benign -Claybon Jabs is clinically doing well.  Tolerating tamoxifen with manageable hot flashes.  Breast exam is benign, labs are unremarkable.  Mammogram was negative.  Overall no clinical concern for breast cancer recurrence -Continue breast cancer surveillance and tamoxifen -Follow-up in 6 months, or sooner if needed   2. Current Smoker, Lung Cancer Screening -Patient enjoys smoking, not strongly motivated to quit but has tried Chantix in the past, did not tolerate  -She has been screened for lung cancer since 2019. last low-dose screening CT on 01/26/21 was negative. -Lung cancer screening CT 01/29/2022 showed benign appearance/behavior, and possible ILD -She is inquiring about bupropion to help her quit, I encouraged her to follow-up with PCP -Continue lung cancer surveillance  3.  Elevated serum creatinine -Scr normal at baseline, up to 1.45 in 10/2021 in the setting of high NSAID use for back and knee pain (from a pickleball injury) -She quit using NSAIDs and is hydrating, creatinine improved and near baseline   Plan: -Recent mammogram and labs reviewed -Continue breast cancer surveillance and tamoxifen, refilled -Follow-up PCP for smoking cessation and other age-appropriate health maintenance     All questions were answered. The patient knows to call the clinic with any problems, questions or concerns. No barriers to learning was detected. I spent 20 minutes counseling the patient face to face. The total time spent in the appointment was 30 minutes and more than 50% was on counseling and review of test results.     Alla Feeling, NP 06/06/22

## 2022-06-06 ENCOUNTER — Inpatient Hospital Stay: Payer: Medicare Other | Attending: Nurse Practitioner | Admitting: Nurse Practitioner

## 2022-06-06 ENCOUNTER — Encounter: Payer: Self-pay | Admitting: Nurse Practitioner

## 2022-06-06 VITALS — BP 150/59 | HR 65 | Temp 98.2°F | Resp 18 | Wt 150.2 lb

## 2022-06-06 DIAGNOSIS — C50412 Malignant neoplasm of upper-outer quadrant of left female breast: Secondary | ICD-10-CM | POA: Diagnosis not present

## 2022-06-06 DIAGNOSIS — F17210 Nicotine dependence, cigarettes, uncomplicated: Secondary | ICD-10-CM | POA: Insufficient documentation

## 2022-06-06 DIAGNOSIS — Z79810 Long term (current) use of selective estrogen receptor modulators (SERMs): Secondary | ICD-10-CM | POA: Insufficient documentation

## 2022-06-06 DIAGNOSIS — Z170 Estrogen receptor positive status [ER+]: Secondary | ICD-10-CM | POA: Insufficient documentation

## 2022-06-06 MED ORDER — TAMOXIFEN CITRATE 20 MG PO TABS: 20.0000 mg | ORAL_TABLET | Freq: Every day | ORAL | 3 refills | Status: DC

## 2022-06-11 DIAGNOSIS — C50912 Malignant neoplasm of unspecified site of left female breast: Secondary | ICD-10-CM | POA: Diagnosis not present

## 2022-06-28 DIAGNOSIS — K7469 Other cirrhosis of liver: Secondary | ICD-10-CM | POA: Diagnosis not present

## 2022-06-28 DIAGNOSIS — B1920 Unspecified viral hepatitis C without hepatic coma: Secondary | ICD-10-CM | POA: Diagnosis not present

## 2022-07-04 ENCOUNTER — Ambulatory Visit (INDEPENDENT_AMBULATORY_CARE_PROVIDER_SITE_OTHER): Payer: Medicare Other | Admitting: Pulmonary Disease

## 2022-07-04 ENCOUNTER — Encounter: Payer: Self-pay | Admitting: Pulmonary Disease

## 2022-07-04 VITALS — BP 126/70 | HR 78 | Ht 61.0 in | Wt 150.0 lb

## 2022-07-04 DIAGNOSIS — J432 Centrilobular emphysema: Secondary | ICD-10-CM

## 2022-07-04 NOTE — Progress Notes (Signed)
Synopsis: Referred in January 2024 for COPD by Truitt Merle, MD  Subjective:   PATIENT ID: [name] GENDER: female DOB: Oct 31, 1945, MRN: [id number]  HPI  Chief Complaint  Patient presents with   Consult    Referred by hematologist for history of COPD. Per patient, she has not had any recent breathing concerns. Denies being on oxygen.    [name] is a 77 year old woman, daily smoker with left breast cancer, GERD, hypertension and emphysema who is referred to pulmonary clinic for COPD evaluation.   She has been enrolled in lung cancer screening over the past few years. Most recent scan 01/29/22 which was Lung-RADS 2 and showed mild basilar peribronchovascular coarsening, centrilobular emphysema, pulmonary nodules 4.32m or less and subpleural radiation scarring of the anterolateral left lung. There is also note of enlarge pulmonary artery trunk.   She is active in cycling, bowling and playing pickle ball. She has no issues doing her ADLs due to dyspnea.   She does report daily cough with mucous production. She does have wheezing intermittently. She is sleeping ok and denies waking up due to cough or wheezing.   Normal spirometry with moderate [facility]   She is not using breo on a daily basis. She is not using albuterol on a frequent basis.   She is smoking 0.75 pack per day. She started smoking at 77years old. She has tried quitting in the past without success. 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 She has a pInternational aid/development worker    Past Medical History:  Diagnosis Date   Allergy    Ankle fracture 03/16/2017   right   Breast cancer (HCC)    Bulging lumbar disc    L4 OR L5 TAKES STEROID INJECTIONS FOR TOOK INJECTION MARCH 2019   Cancer ([place]    Skin CA, removed   Chronic bronchitis (HCC) FEW YEARS AGO   Chronic hepatitis C ([place]    TOOK HARVONI [place] 2017    Colon polyp    COPD (chronic obstructive  pulmonary disease) (HCC)    Diverticulitis 34YRS AGO   Dyspnea    WITH HEAVY EXERTION   [place] lung [place]    TOOK PULMONARY FUCTION TEST AND PASSED FEW WEEKS AGO AT [facility]   Gallstones    GERD (gastroesophageal reflux disease)    History of kidney stones 20 YRS AGO    Hypertension    Osteoarthritis    Pneumonia AT BIRTH   Spondylosis of cervical region without myelopathy or radiculopathy      Family History  Problem Relation Age of Onset   Heart disease Mother    Stroke Brother    Stroke Maternal Uncle    COPD Paternal Uncle      Social History   Socioeconomic History   Marital status: Legally Separated    Spouse name: Not on file   Number of children: 0   Years of education: Not on file   Highest education level: Not on file  Occupational History   Occupation: retired  Tobacco Use   Smoking status: Every Day    Packs/day: 1.00    Years: 60.00    Total pack years: 60.00    Types: Cigarettes    Start date: 07/20/1960   Smokeless tobacco: Never  Vaping Use   Vaping Use: Former  Substance and Sexual Activity   Alcohol use: Not Currently  Alcohol/week: 0.0 standard drinks of alcohol    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   Drug use: No   Sexual activity: Not on file  Other Topics Concern   Not on file  Social History Narrative   Not on file   Social Determinants of Health   Financial Resource Strain: Not on file  Food Insecurity: Not on file  Transportation Needs: Not on file  Physical Activity: Not on file  Stress: Not on file  Social Connections: Not on file  Intimate Partner Violence: Not At Risk (11/22/2020)   Humiliation, Afraid, Rape, and Kick questionnaire    Fear of Current or Ex-Partner: No    Emotionally Abused: No    Physically Abused: No    Sexually Abused: No     Allergies  Allergen Reactions   Chantix [Varenicline]     Makes her feel crazy   Codeine Itching     Outpatient Medications Prior to Visit  Medication Sig  Dispense Refill   albuterol (PROVENTIL HFA;VENTOLIN HFA) 108 (90 BASE) MCG/ACT inhaler Inhale 2 puffs into the lungs every 6 (six) hours as needed for wheezing or shortness of breath.      amLODipine (NORVASC) 5 MG tablet Take 5 mg by mouth every evening.     aspirin EC 81 MG tablet Take 81 mg by mouth daily. Swallow whole.     atorvastatin (LIPITOR) 20 MG tablet Take 20 mg by mouth every evening.      Bacillus Coagulans-Inulin (ALIGN PREBIOTIC-PROBIOTIC PO) Take 1 capsule by mouth daily.     cholecalciferol (VITAMIN D3) 25 MCG (1000 UNIT) tablet Take 1,000 Units by mouth daily.     fluticasone furoate-vilanterol (BREO ELLIPTA) 200-25 MCG/INH AEPB Inhale 1 puff into the lungs daily.     GEMTESA 75 MG TABS Take 1 tablet by mouth daily.     ipratropium (ATROVENT) 0.06 % nasal spray Place 2 sprays into both nostrils daily as needed for rhinitis.     montelukast (SINGULAIR) 10 MG tablet Take 10 mg by mouth every evening.      PREVIDENT 5000 BOOSTER PLUS 1.1 % PSTE Place 1 application onto teeth daily.     tamoxifen (NOLVADEX) 20 MG tablet Take 1 tablet (20 mg total) by mouth daily. 90 tablet 3   No facility-administered medications prior to visit.   Review of Systems  Constitutional:  Negative for chills, fever, malaise/fatigue and weight loss.  HENT:  Negative for congestion, sinus pain and sore throat.   Eyes: Negative.   Respiratory:  Positive for cough, sputum production and wheezing. Negative for hemoptysis and shortness of breath.   Cardiovascular:  Negative for chest pain, palpitations, orthopnea, claudication and leg swelling.  Gastrointestinal:  Negative for abdominal pain, heartburn, nausea and vomiting.  Genitourinary: Negative.   Musculoskeletal:  Negative for joint pain and myalgias.  Skin:  Negative for rash.  Neurological:  Negative for weakness.  Endo/Heme/Allergies: Negative.   Psychiatric/Behavioral: Negative.     Objective:   Vitals:   07/04/22 1005  BP: 126/70   Pulse: 78  SpO2: 97%  Weight: 150 lb (68 kg)  Height: '5\' 1"'$  (1.549 m)   Physical Exam Constitutional:      General: She is not in acute distress.    Appearance: She is not ill-appearing.  HENT:     Head: Normocephalic and atraumatic.  Eyes:     General: No scleral icterus.    Conjunctiva/sclera: Conjunctivae normal.     Pupils: Pupils are equal, round,  and reactive to light.  Cardiovascular:     Rate and Rhythm: Normal rate and regular rhythm.     Pulses: Normal pulses.     Heart sounds: Normal heart sounds. No murmur heard. Pulmonary:     Effort: Pulmonary effort is normal.     Breath sounds: Wheezing (RUL) present. No rhonchi or rales.  Abdominal:     General: Bowel sounds are normal.     Palpations: Abdomen is soft.  Musculoskeletal:     Right lower leg: No edema.     Left lower leg: No edema.  Lymphadenopathy:     Cervical: No cervical adenopathy.  Skin:    General: Skin is warm and dry.  Neurological:     General: No focal deficit present.     Mental Status: She is alert.  Psychiatric:        Mood and Affect: Mood normal.        Behavior: Behavior normal.        Thought Content: Thought content normal.        Judgment: Judgment normal.    CBC    Component Value Date/Time   WBC 7.4 05/23/2022 0936   WBC 8.0 10/17/2020 1429   RBC 4.07 05/23/2022 0936   HGB 12.8 05/23/2022 0936   HCT 38.2 05/23/2022 0936   PLT 251 05/23/2022 0936   MCV 93.9 05/23/2022 0936   MCH 31.4 05/23/2022 0936   MCHC 33.5 05/23/2022 0936   RDW 12.6 05/23/2022 0936   LYMPHSABS 2.1 05/23/2022 0936   MONOABS 0.5 05/23/2022 0936   EOSABS 0.2 05/23/2022 0936   BASOSABS 0.0 05/23/2022 0936      Latest Ref Rng & Units 05/23/2022    9:36 AM 11/16/2021    1:10 PM 06/14/2021    3:46 PM  BMP  Glucose 70 - 99 mg/dL 122  116  110   BUN 8 - 23 mg/dL '24  24  17   '$ Creatinine 0.44 - 1.00 mg/dL 1.07  1.45  1.06   Sodium 135 - 145 mmol/L 141  140  144   Potassium 3.5 - 5.1 mmol/L 4.8  4.7   4.6   Chloride 98 - 111 mmol/L 107  107  109   CO2 22 - 32 mmol/L '26  27  27   '$ Calcium 8.9 - 10.3 mg/dL 8.8  9.1  9.1    Chest imaging: CT Chest LCS 01/29/22 1. Lung-RADS 2, benign appearance or behavior. Continue annual screening with low-dose chest CT without contrast in 12 months. 2. Mild basilar peribronchovascular coarsening, suggesting interstitial lung disease. If further evaluation is desired, high resolution chest CT without contrast is recommended. 3. Left renal stones. 4. Post treatment changes in the left breast and anterior left lung. 5. Aortic atherosclerosis (ICD10-I70.0). Coronary artery calcification. 6. Enlarged pulmonic trunk, indicative of pulmonary arterial hypertension. 7.  Emphysema  PFT:     No data to display          Labs:  Path:  Echo:  Heart Catheterization 2014: 1. Left Main: Widely patent  2. LAD: Widely patent. There is a small first diagonal and a moderate size second diagonal. Both of these are widely patent.  3. Circumflex: Widely patent. An obtuse marginal artery is widely patent.  4. Right Coronary Artery: Dominant. Widely patent. The PDA and PLV are widely patent.  5. Hemodynamics: Aortic pressure is 124/64 mmHg. LV pressure is 07/25/13 mmHg.   Assessment & Plan:   Centrilobular emphysema ([place]) - Plan: Pulse  oximetry, overnight, Pulmonary Function Test  Discussion: Jowanna Debruler is a 77 year old woman, daily smoker with left breast cancer, GERD, hypertension and emphysema who is referred to pulmonary clinic for COPD evaluation.   She has findings of centrilobular emphysema on her CT lung cancer screening.  PFTs from 2014 showed normal spirometry with diffusion defect present.  She has enlarged pulmonary artery trunk on recent CT scan.  We will check pulmonary function testing and overnight oximetry testing.  She is to use Breo Ellipta 200-5 mcg 1 puff daily and albuterol as needed.  We briefly discussed the importance of  quitting smoking which she is not interested at this time.  Follow-up in 1 month with PFTs.  Freda Jackson, MD [facility] Office: [phone]    Current Outpatient Medications:    albuterol (PROVENTIL HFA;VENTOLIN HFA) 108 (90 BASE) MCG/ACT inhaler, Inhale 2 puffs into the lungs every 6 (six) hours as needed for wheezing or shortness of breath. , Disp: , Rfl:    amLODipine (NORVASC) 5 MG tablet, Take 5 mg by mouth every evening., Disp: , Rfl:    aspirin EC 81 MG tablet, Take 81 mg by mouth daily. Swallow whole., Disp: , Rfl:    atorvastatin (LIPITOR) 20 MG tablet, Take 20 mg by mouth every evening. , Disp: , Rfl:    Bacillus Coagulans-Inulin (ALIGN PREBIOTIC-PROBIOTIC PO), Take 1 capsule by mouth daily., Disp: , Rfl:    cholecalciferol (VITAMIN D3) 25 MCG (1000 UNIT) tablet, Take 1,000 Units by mouth daily., Disp: , Rfl:    fluticasone furoate-vilanterol (BREO ELLIPTA) 200-25 MCG/INH AEPB, Inhale 1 puff into the lungs daily., Disp: , Rfl:    GEMTESA 75 MG TABS, Take 1 tablet by mouth daily., Disp: , Rfl:    ipratropium (ATROVENT) 0.06 % nasal spray, Place 2 sprays into both nostrils daily as needed for rhinitis., Disp: , Rfl:    montelukast (SINGULAIR) 10 MG tablet, Take 10 mg by mouth every evening. , Disp: , Rfl:    PREVIDENT 5000 BOOSTER PLUS 1.1 % PSTE, Place 1 application onto teeth daily., Disp: , Rfl:    tamoxifen (NOLVADEX) 20 MG tablet, Take 1 tablet (20 mg total) by mouth daily., Disp: 90 tablet, Rfl: 3

## 2022-07-04 NOTE — Patient Instructions (Addendum)
Use breo 1 puff daily  - rinse mouth out after each use  Use albuterol inhaler 1-2 puffs every 4-6 hours as needed  We will check your oxygen levels when you sleep at night  We will check pulmonary function tests in 1 month with follow up visit

## 2022-07-06 ENCOUNTER — Other Ambulatory Visit: Payer: Self-pay

## 2022-07-06 DIAGNOSIS — J432 Centrilobular emphysema: Secondary | ICD-10-CM

## 2022-07-06 NOTE — Addendum Note (Signed)
Addended by: Freda Jackson on: 07/06/2022 11:53 AM   Modules accepted: Orders

## 2022-07-16 ENCOUNTER — Telehealth: Payer: Self-pay | Admitting: Pulmonary Disease

## 2022-07-16 NOTE — Telephone Encounter (Signed)
Message from [place] at [employer]   [place], [place], [place]; [place], [place], [place]; Marlowe Sax, Merlene Laughter [place],  It has been sent to our scheduling team and a rep will contact the patient today to set up delivery.  Thanks!

## 2022-07-19 ENCOUNTER — Telehealth: Payer: Self-pay | Admitting: Pulmonary Disease

## 2022-07-19 NOTE — Telephone Encounter (Signed)
Please let patient know that her ONO results show she spent 27 minutes and 16 seconds with an SpO2 less than 88%. She does technically qualify for overnight oxygen based on these results but this was only due to one episode after reviewing the tracing where there could have been an error in recording as the rest of the night she was well about an SpO2 of 90%.   I do not recommend the use of supplemental oxygen at night.   Thanks, JD

## 2022-07-25 ENCOUNTER — Encounter: Payer: Self-pay | Admitting: Pulmonary Disease

## 2022-07-25 DIAGNOSIS — H30042 Focal chorioretinal inflammation, macular or paramacular, left eye: Secondary | ICD-10-CM | POA: Diagnosis not present

## 2022-07-25 DIAGNOSIS — H43811 Vitreous degeneration, right eye: Secondary | ICD-10-CM | POA: Diagnosis not present

## 2022-07-25 DIAGNOSIS — H26491 Other secondary cataract, right eye: Secondary | ICD-10-CM | POA: Diagnosis not present

## 2022-07-25 DIAGNOSIS — H35352 Cystoid macular degeneration, left eye: Secondary | ICD-10-CM | POA: Diagnosis not present

## 2022-07-25 NOTE — Telephone Encounter (Signed)
Called and spoke with patient. She verbalized understanding of results. I reminded her of her PFT and OV scheduled for 02/13 and went over the instructions for the PFT. She verbalized understanding.  Nothing further needed at time of call.

## 2022-08-03 DIAGNOSIS — H30042 Focal chorioretinal inflammation, macular or paramacular, left eye: Secondary | ICD-10-CM | POA: Diagnosis not present

## 2022-08-06 DIAGNOSIS — K7460 Unspecified cirrhosis of liver: Secondary | ICD-10-CM | POA: Diagnosis not present

## 2022-08-06 DIAGNOSIS — K7469 Other cirrhosis of liver: Secondary | ICD-10-CM | POA: Diagnosis not present

## 2022-08-06 DIAGNOSIS — B1920 Unspecified viral hepatitis C without hepatic coma: Secondary | ICD-10-CM | POA: Diagnosis not present

## 2022-08-07 DIAGNOSIS — K7469 Other cirrhosis of liver: Secondary | ICD-10-CM | POA: Diagnosis not present

## 2022-08-07 DIAGNOSIS — B1920 Unspecified viral hepatitis C without hepatic coma: Secondary | ICD-10-CM | POA: Diagnosis not present

## 2022-08-08 DIAGNOSIS — Z01810 Encounter for preprocedural cardiovascular examination: Secondary | ICD-10-CM | POA: Diagnosis not present

## 2022-08-09 DIAGNOSIS — I493 Ventricular premature depolarization: Secondary | ICD-10-CM | POA: Diagnosis not present

## 2022-08-09 DIAGNOSIS — I491 Atrial premature depolarization: Secondary | ICD-10-CM | POA: Diagnosis not present

## 2022-08-09 DIAGNOSIS — I498 Other specified cardiac arrhythmias: Secondary | ICD-10-CM | POA: Diagnosis not present

## 2022-08-09 DIAGNOSIS — Z01810 Encounter for preprocedural cardiovascular examination: Secondary | ICD-10-CM | POA: Diagnosis not present

## 2022-08-13 ENCOUNTER — Telehealth: Payer: Self-pay | Admitting: Pulmonary Disease

## 2022-08-13 NOTE — Telephone Encounter (Signed)
Tried to call pt but line just rang busy. Tried to call pt again and same thing happened. Will try to call back  later.

## 2022-08-14 ENCOUNTER — Ambulatory Visit (INDEPENDENT_AMBULATORY_CARE_PROVIDER_SITE_OTHER): Payer: Medicare Other | Admitting: Pulmonary Disease

## 2022-08-14 ENCOUNTER — Encounter: Payer: Self-pay | Admitting: Pulmonary Disease

## 2022-08-14 ENCOUNTER — Other Ambulatory Visit: Payer: Self-pay

## 2022-08-14 VITALS — BP 128/84 | HR 76 | Ht 60.0 in | Wt 150.8 lb

## 2022-08-14 DIAGNOSIS — J432 Centrilobular emphysema: Secondary | ICD-10-CM | POA: Diagnosis not present

## 2022-08-14 MED ORDER — TRELEGY ELLIPTA 100-62.5-25 MCG/ACT IN AEPB: 1.0000 | INHALATION_SPRAY | Freq: Every day | RESPIRATORY_TRACT | 0 refills | Status: DC

## 2022-08-14 NOTE — Addendum Note (Signed)
Addended by: Valerie Salts on: 08/14/2022 01:31 PM   Modules accepted: Orders

## 2022-08-14 NOTE — Progress Notes (Signed)
Synopsis: Referred in January 2024 for COPD by Truitt Merle, MD  Subjective:   PATIENT ID: [name] DOB: 1945/08/04, MRN: [id number]  HPI  Chief Complaint  Patient presents with   Follow-up    F/U after PFT. States her breathing has been the same since last visit.    [name] is a 77 year old woman, daily smoker with left breast cancer, GERD, hypertension and emphysema who is returns to pulmonary clinic for COPD evaluation.   PFTs show normal spirometry, no significant bronchodilator response, normal lung volumes and mild diffusion defect  She does have intermittent wheezing. Does not feel the breo has helped much. She is using albuterol as needed 2-3 days per week. She continues to have daily cough with mucous production.   Initial OV 07/04/2022 She has been enrolled in lung cancer screening over the past few years. Most recent scan 01/29/22 which was Lung-RADS 2 and showed mild basilar peribronchovascular coarsening, centrilobular emphysema, pulmonary nodules 4.32m or less and subpleural radiation scarring of the anterolateral left lung. There is also note of enlarge pulmonary artery trunk.   She is active in cycling, bowling and playing pickle ball. She has no issues doing her ADLs due to dyspnea.   She does report daily cough with mucous production. She does have wheezing intermittently. She is sleeping ok and denies waking up due to cough or wheezing.   Normal spirometry with moderate [facility]using [facility]   She is not using breo on a daily basis. She is not using albuterol on a frequent basis.   She is smoking 0.75 pack per day. She started smoking at 77years old. She has tried quitting in the past without success. 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 She has a pInternational aid/development worker    Past Medical History:  Diagnosis Date   Allergy    Ankle fracture 03/16/2017   right   Breast cancer  (HCC)    Bulging lumbar disc    L4 OR L5 TAKES STEROID INJECTIONS FOR TOOK INJECTION MARCH 2019   Cancer ([facility]    Skin CA, removed   Chronic bronchitis (HCC) FEW YEARS AGO   [place] 2017    Colon polyp    COPD (chronic obstructive pulmonary disease) (HCC)    Diverticulitis 37YRS AGO   Dyspnea    WITH HEAVY EXERTION   [place]    TOOK PULMONARY FUCTION TEST AND PASSED FEW WEEKS AGO AT FAMILY  MD   Gallstones    GERD (gastroesophageal reflux disease)    History of kidney stones 20 YRS AGO    Hypertension    Osteoarthritis    Pneumonia AT BIRTH   Spondylosis of cervical region without myelopathy or radiculopathy      Family History  Problem Relation Age of Onset   Heart disease Mother    Stroke Brother    Stroke Maternal Uncle    COPD Paternal Uncle      Social History   Socioeconomic History   Marital status: Legally Separated    Spouse name: Not on file   Number of children: 0   Years of education: Not on file   Highest education level: Not on file  Occupational History   Occupation: retired  Tobacco Use   Smoking status: Every Day    Packs/day: 1.00  Years: 60.00    Total pack years: 60.00    Types: Cigarettes    Start date: 07/20/1960   Smokeless tobacco: Never  Vaping Use   Vaping Use: Former  Substance and Sexual Activity   Alcohol use: Not Currently    Alcohol/week: 0.0 standard drinks of alcohol    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   Drug use: No   Sexual activity: Not on file  Other Topics Concern   Not on file  Social History Narrative   Not on file   Social Determinants of Health   Financial Resource Strain: Not on file  Food Insecurity: Not on file  Transportation Needs: Not on file  Physical Activity: Not on file  Stress: Not on file  Social Connections: Not on file  Intimate Partner Violence: Not At Risk (11/22/2020)   Humiliation, Afraid, Rape, and Kick questionnaire     Fear of Current or Ex-Partner: No    Emotionally Abused: No    Physically Abused: No    Sexually Abused: No     Allergies  Allergen Reactions   Chantix [Varenicline]     Makes her feel crazy   Codeine Itching     Outpatient Medications Prior to Visit  Medication Sig Dispense Refill   albuterol (PROVENTIL HFA;VENTOLIN HFA) 108 (90 BASE) MCG/ACT inhaler Inhale 2 puffs into the lungs every 6 (six) hours as needed for wheezing or shortness of breath.      amLODipine (NORVASC) 5 MG tablet Take 5 mg by mouth every evening.     aspirin EC 81 MG tablet Take 81 mg by mouth daily. Swallow whole.     atorvastatin (LIPITOR) 20 MG tablet Take 20 mg by mouth every evening.      Bacillus Coagulans-Inulin (ALIGN PREBIOTIC-PROBIOTIC PO) Take 1 capsule by mouth daily.     cholecalciferol (VITAMIN D3) 25 MCG (1000 UNIT) tablet Take 1,000 Units by mouth daily.     fluticasone furoate-vilanterol (BREO ELLIPTA) 200-25 MCG/INH AEPB Inhale 1 puff into the lungs daily.     GEMTESA 75 MG TABS Take 1 tablet by mouth daily.     ipratropium (ATROVENT) 0.06 % nasal spray Place 2 sprays into both nostrils daily as needed for rhinitis.     montelukast (SINGULAIR) 10 MG tablet Take 10 mg by mouth every evening.      PREVIDENT 5000 BOOSTER PLUS 1.1 % PSTE Place 1 application onto teeth daily.     tamoxifen (NOLVADEX) 20 MG tablet Take 1 tablet (20 mg total) by mouth daily. 90 tablet 3   No facility-administered medications prior to visit.   Review of Systems  Constitutional:  Negative for chills, fever, malaise/fatigue and weight loss.  HENT:  Negative for congestion, sinus pain and sore throat.   Eyes: Negative.   Respiratory:  Positive for cough, sputum production and wheezing. Negative for hemoptysis and shortness of breath.   Cardiovascular:  Negative for chest pain, palpitations, orthopnea, claudication and leg swelling.  Gastrointestinal:  Negative for abdominal pain, heartburn, nausea and vomiting.   Genitourinary: Negative.   Musculoskeletal:  Negative for joint pain and myalgias.  Skin:  Negative for rash.  Neurological:  Negative for weakness.  Endo/Heme/Allergies: Negative.   Psychiatric/Behavioral: Negative.     Objective:   Vitals:   08/14/22 1113  BP: 128/84  Pulse: 76  SpO2: 98%  Weight: 150 lb 12.8 oz (68.4 kg)  Height: 5' (1.524 m)   Physical Exam Constitutional:      General: She  is not in acute distress.    Appearance: She is not ill-appearing.  HENT:     Head: Normocephalic and atraumatic.  Eyes:     General: No scleral icterus.    Conjunctiva/sclera: Conjunctivae normal.  Cardiovascular:     Rate and Rhythm: Normal rate and regular rhythm.     Pulses: Normal pulses.     Heart sounds: Normal heart sounds. No murmur heard. Pulmonary:     Effort: Pulmonary effort is normal.     Breath sounds: No wheezing, rhonchi or rales.  Musculoskeletal:     Right lower leg: No edema.     Left lower leg: No edema.  Skin:    General: Skin is warm and dry.  Neurological:     General: No focal deficit present.     Mental Status: She is alert.    CBC    Component Value Date/Time   WBC 7.4 05/23/2022 0936   WBC 8.0 10/17/2020 1429   RBC 4.07 05/23/2022 0936   HGB 12.8 05/23/2022 0936   HCT 38.2 05/23/2022 0936   PLT 251 05/23/2022 0936   MCV 93.9 05/23/2022 0936   MCH 31.4 05/23/2022 0936   MCHC 33.5 05/23/2022 0936   RDW 12.6 05/23/2022 0936   LYMPHSABS 2.1 05/23/2022 0936   MONOABS 0.5 05/23/2022 0936   EOSABS 0.2 05/23/2022 0936   BASOSABS 0.0 05/23/2022 0936      Latest Ref Rng & Units 05/23/2022    9:36 AM 11/16/2021    1:10 PM 06/14/2021    3:46 PM  BMP  Glucose 70 - 99 mg/dL 122  116  110   BUN 8 - 23 mg/dL 24  24  17   $ Creatinine 0.44 - 1.00 mg/dL 1.07  1.45  1.06   Sodium 135 - 145 mmol/L 141  140  144   Potassium 3.5 - 5.1 mmol/L 4.8  4.7  4.6   Chloride 98 - 111 mmol/L 107  107  109   CO2 22 - 32 mmol/L 26  27  27   $ Calcium 8.9 - 10.3  mg/dL 8.8  9.1  9.1    Chest imaging: CT Chest LCS 01/29/22 1. Lung-RADS 2, benign appearance or behavior. Continue annual screening with low-dose chest CT without contrast in 12 months. 2. Mild basilar peribronchovascular coarsening, suggesting interstitial lung disease. If further evaluation is desired, high resolution chest CT without contrast is recommended. 3. Left renal stones. 4. Post treatment changes in the left breast and anterior left lung. 5. Aortic atherosclerosis (ICD10-I70.0). Coronary artery calcification. 6. Enlarged pulmonic trunk, indicative of pulmonary arterial hypertension. 7.  Emphysema  PFT:     No data to display          Labs:  Path:  Echo:  Heart Catheterization 2014: 1. Left Main: Widely patent  2. LAD: Widely patent. There is a small first diagonal and a moderate size second diagonal. Both of these are widely patent.  3. Circumflex: Widely patent. An obtuse marginal artery is widely patent.  4. Right Coronary Artery: Dominant. Widely patent. The PDA and PLV are widely patent.  5. Hemodynamics: Aortic pressure is 124/64 mmHg. LV pressure is 07/25/13 mmHg.   Assessment & Plan:   Centrilobular emphysema ([place])  Discussion: [name] is a 77 year old woman, daily smoker with left breast cancer, GERD, hypertension and emphysema who is referred to pulmonary clinic for COPD evaluation.   She has findings of centrilobular emphysema on her CT lung cancer screening.  PFTs  show normal spirometry, no significant bronchodilator response, normal lung volumes and mild diffusion defect  She is to try trelegy ellipta 100, 1 puff daily and monitor for any change in her symptoms and as needed albuterol.   She did not respond to ICS/LABA inhaler with Breo trial.   I have recommended smoking cessation with nicotine replacement therapy with patches and lozenges.  Follow up in 1 year.  Freda Jackson, MD [facility] Office:  [phone]    Current Outpatient Medications:    albuterol (PROVENTIL HFA;VENTOLIN HFA) 108 (90 BASE) MCG/ACT inhaler, Inhale 2 puffs into the lungs every 6 (six) hours as needed for wheezing or shortness of breath. , Disp: , Rfl:    amLODipine (NORVASC) 5 MG tablet, Take 5 mg by mouth every evening., Disp: , Rfl:    aspirin EC 81 MG tablet, Take 81 mg by mouth daily. Swallow whole., Disp: , Rfl:    atorvastatin (LIPITOR) 20 MG tablet, Take 20 mg by mouth every evening. , Disp: , Rfl:    Bacillus Coagulans-Inulin (ALIGN PREBIOTIC-PROBIOTIC PO), Take 1 capsule by mouth daily., Disp: , Rfl:    cholecalciferol (VITAMIN D3) 25 MCG (1000 UNIT) tablet, Take 1,000 Units by mouth daily., Disp: , Rfl:    fluticasone furoate-vilanterol (BREO ELLIPTA) 200-25 MCG/INH AEPB, Inhale 1 puff into the lungs daily., Disp: , Rfl:    GEMTESA 75 MG TABS, Take 1 tablet by mouth daily., Disp: , Rfl:    ipratropium (ATROVENT) 0.06 % nasal spray, Place 2 sprays into both nostrils daily as needed for rhinitis., Disp: , Rfl:    montelukast (SINGULAIR) 10 MG tablet, Take 10 mg by mouth every evening. , Disp: , Rfl:    PREVIDENT 5000 BOOSTER PLUS 1.1 % PSTE, Place 1 application onto teeth daily., Disp: , Rfl:    tamoxifen (NOLVADEX) 20 MG tablet, Take 1 tablet (20 mg total) by mouth daily., Disp: 90 tablet, Rfl: 3   Fluticasone-Umeclidin-Vilant (TRELEGY ELLIPTA) 100-62.5-25 MCG/ACT AEPB, Inhale 1 puff into the lungs daily., Disp: 1 each, Rfl: 0

## 2022-08-14 NOTE — Patient Instructions (Addendum)
Ok to stop the breo since you have not noticed benefit.  Will give you a sample of trelegy ellipta 100, 1 puff daily - rinse mouth out - if you notice improvement then please let us know  If you do not notice any improvement with the trelegy, then you do not need a maintenance inhaler.   Continue to use albuterol inhaler as needed, 1-2 puffs every 4-6 hours.   Recommend quitting smoking using nicotine patches 7-55m daily and mini nicotine lozenges.   Follow up in 1 year

## 2022-08-14 NOTE — Progress Notes (Signed)
Full PFT completed

## 2022-08-15 NOTE — Telephone Encounter (Signed)
Pt had PFT performed 2/13 along with OV after. Closing encounter.

## 2022-08-27 LAB — PULMONARY FUNCTION TEST
DL/VA % pred: 83 %
DL/VA: 3.54 ml/min/mmHg/L
DLCO cor % pred: 63 %
DLCO cor: 10.42 ml/min/mmHg
DLCO unc % pred: 63 %
DLCO unc: 10.42 ml/min/mmHg
FEF 25-75 Post: 1.3 L/sec
FEF 25-75 Pre: 1.03 L/sec
FEF2575-%Change-Post: 26 %
FEF2575-%Pred-Post: 97 %
FEF2575-%Pred-Pre: 77 %
FEV1-%Change-Post: 4 %
FEV1-%Pred-Post: 89 %
FEV1-%Pred-Pre: 86 %
FEV1-Post: 1.5 L
FEV1-Pre: 1.44 L
FEV1FVC-%Change-Post: 8 %
FEV1FVC-%Pred-Pre: 100 %
FEV6-%Change-Post: -3 %
FEV6-%Pred-Post: 86 %
FEV6-%Pred-Pre: 89 %
FEV6-Post: 1.85 L
FEV6-Pre: 1.92 L
FEV6FVC-%Change-Post: 0 %
FEV6FVC-%Pred-Post: 105 %
FEV6FVC-%Pred-Pre: 105 %
FVC-%Change-Post: -3 %
FVC-%Pred-Post: 82 %
FVC-%Pred-Pre: 85 %
FVC-Post: 1.85 L
Post FEV1/FVC ratio: 81 %
Post FEV6/FVC ratio: 100 %
Pre FEV1/FVC ratio: 75 %
Pre FEV6/FVC Ratio: 99 %
RV % pred: 116 %
RV: 2.47 L
TLC % pred: 96 %
TLC: 4.3 L

## 2022-08-31 ENCOUNTER — Encounter: Payer: Self-pay | Admitting: Pulmonary Disease

## 2022-08-31 MED ORDER — TRELEGY ELLIPTA 100-62.5-25 MCG/ACT IN AEPB: 1.0000 | INHALATION_SPRAY | Freq: Every day | RESPIRATORY_TRACT | 5 refills | Status: DC

## 2022-09-07 DIAGNOSIS — M5416 Radiculopathy, lumbar region: Secondary | ICD-10-CM | POA: Diagnosis not present

## 2022-09-07 DIAGNOSIS — M7701 Medial epicondylitis, right elbow: Secondary | ICD-10-CM | POA: Diagnosis not present

## 2022-09-10 DIAGNOSIS — I4719 Other supraventricular tachycardia: Secondary | ICD-10-CM | POA: Diagnosis not present

## 2022-09-13 DIAGNOSIS — Z85828 Personal history of other malignant neoplasm of skin: Secondary | ICD-10-CM | POA: Diagnosis not present

## 2022-09-13 DIAGNOSIS — Z08 Encounter for follow-up examination after completed treatment for malignant neoplasm: Secondary | ICD-10-CM | POA: Diagnosis not present

## 2022-09-13 DIAGNOSIS — L821 Other seborrheic keratosis: Secondary | ICD-10-CM | POA: Diagnosis not present

## 2022-09-13 DIAGNOSIS — D485 Neoplasm of uncertain behavior of skin: Secondary | ICD-10-CM | POA: Diagnosis not present

## 2022-09-13 DIAGNOSIS — C44722 Squamous cell carcinoma of skin of right lower limb, including hip: Secondary | ICD-10-CM | POA: Diagnosis not present

## 2022-09-18 DIAGNOSIS — M5416 Radiculopathy, lumbar region: Secondary | ICD-10-CM | POA: Diagnosis not present

## 2022-09-19 DIAGNOSIS — H35352 Cystoid macular degeneration, left eye: Secondary | ICD-10-CM | POA: Diagnosis not present

## 2022-09-19 DIAGNOSIS — H26491 Other secondary cataract, right eye: Secondary | ICD-10-CM | POA: Diagnosis not present

## 2022-09-19 DIAGNOSIS — H30042 Focal chorioretinal inflammation, macular or paramacular, left eye: Secondary | ICD-10-CM | POA: Diagnosis not present

## 2022-09-19 DIAGNOSIS — H43811 Vitreous degeneration, right eye: Secondary | ICD-10-CM | POA: Diagnosis not present

## 2022-10-03 DIAGNOSIS — C44722 Squamous cell carcinoma of skin of right lower limb, including hip: Secondary | ICD-10-CM | POA: Diagnosis not present

## 2022-10-08 IMAGING — MG DIGITAL DIAGNOSTIC BILAT W/ TOMO W/ CAD
6 of 10 series · 6 of 30 positions shown · non-contrast
Comparison: Previous exam(s).

CLINICAL DATA: 74-year-old female presenting as a recall from
screening for possible bilateral breast masses.

EXAM:
DIGITAL DIAGNOSTIC BILATERAL MAMMOGRAM WITH TOMOSYNTHESIS AND CAD;
ULTRASOUND LEFT BREAST LIMITED; ULTRASOUND RIGHT BREAST LIMITED
TECHNIQUE: Bilateral digital diagnostic mammography and breast tomosynthesis
was performed. The images were evaluated with computer-aided
detection.; Targeted ultrasound examination of the left breast was
performed; Targeted ultrasound examination of the right breast was
performed

[R CC synth-2D]
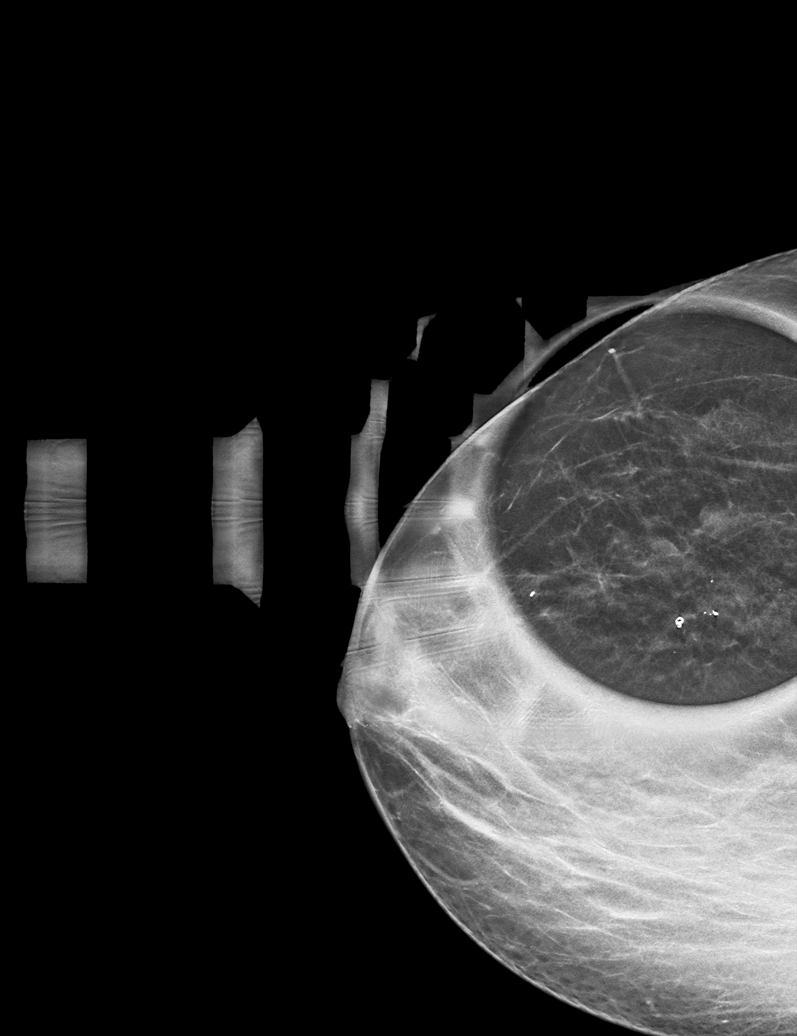

[R ML synth-2D]
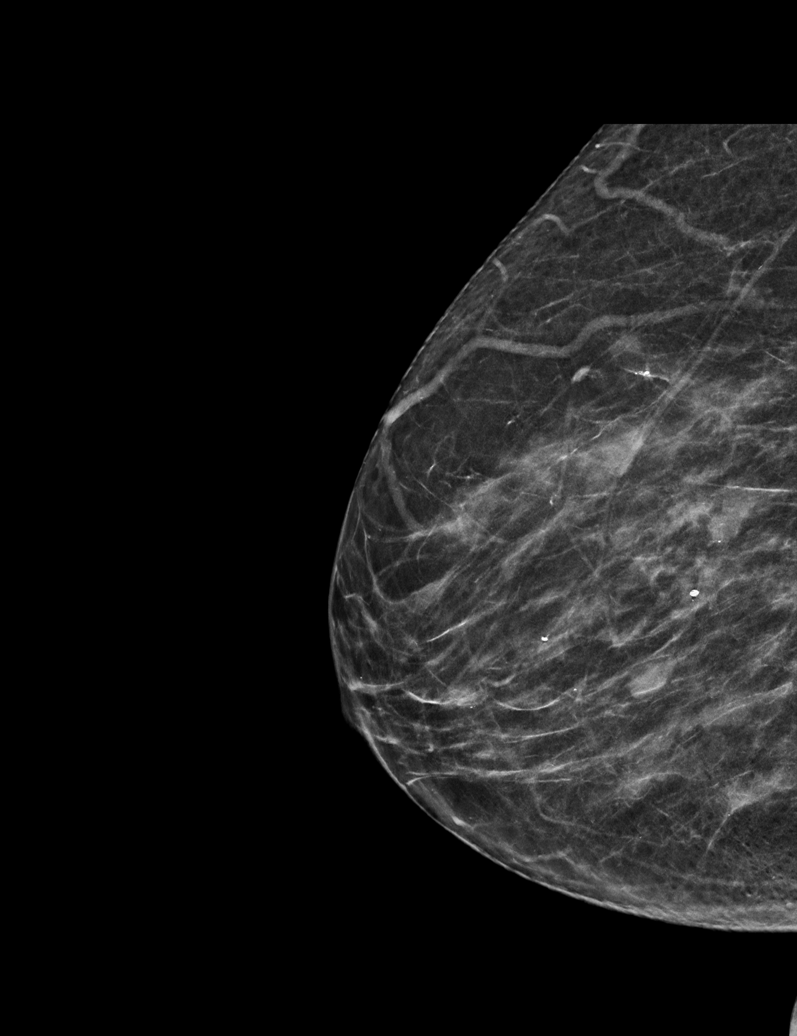

[L MLO synth-2D]
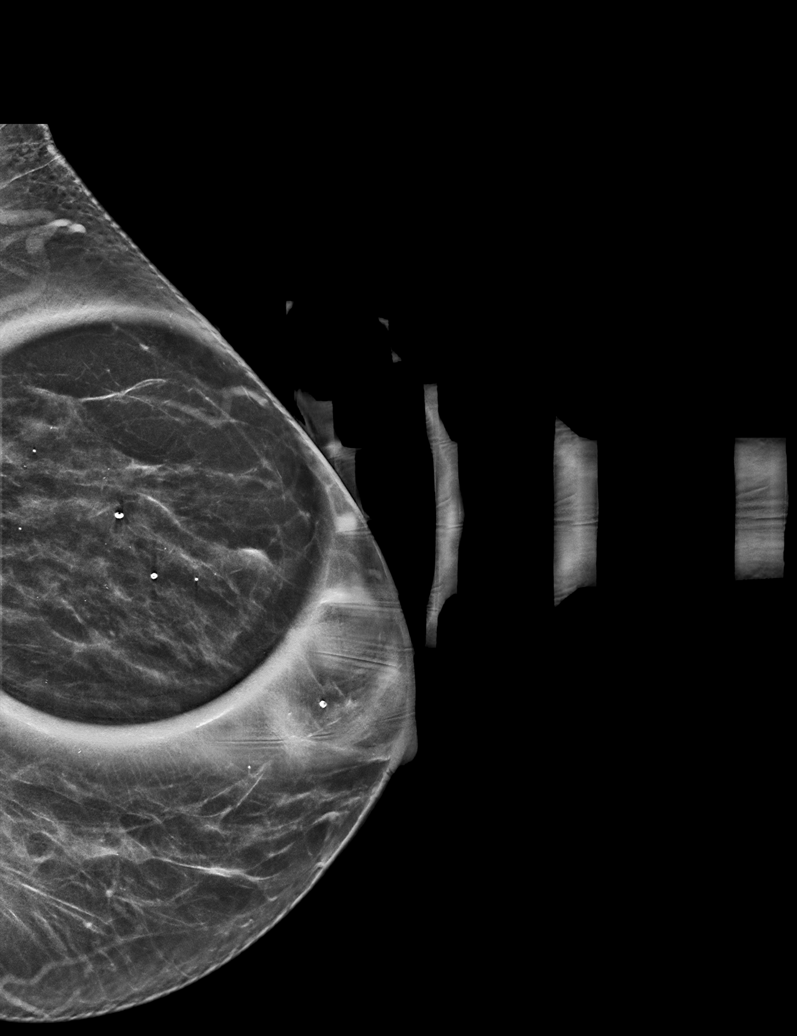

[R MLO synth-2D]
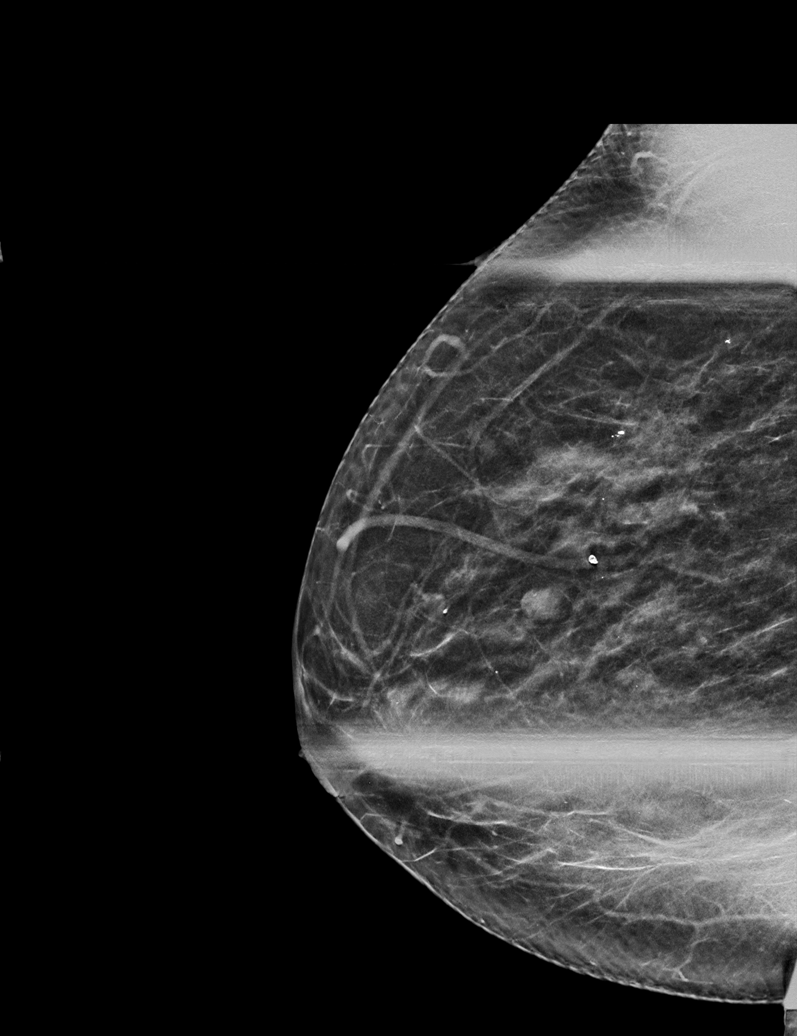

[L CC synth-2D]
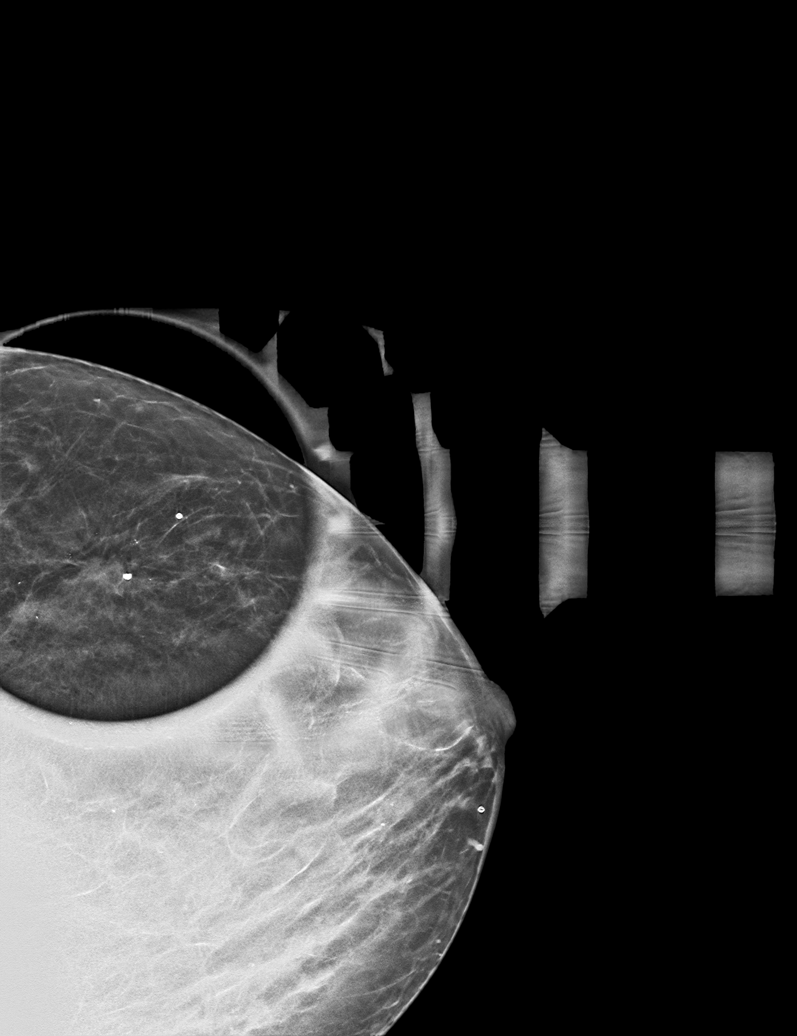

[L CC tomo · tomo slice 21/40.0]
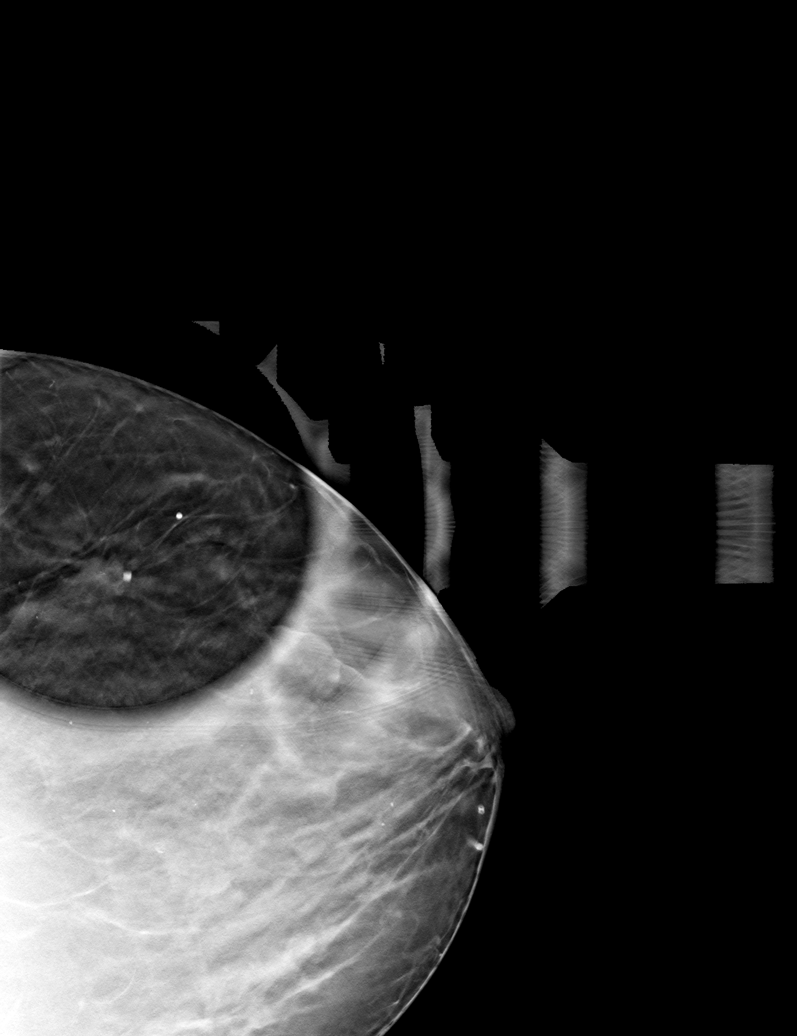

[6 of 30 positions shown; findings below may reference images not displayed]

ACR Breast Density Category c: The breast tissue is heterogeneously
dense, which may obscure small masses.
FINDINGS: Mammogram:

Right breast: Spot compression tomosynthesis views and full field mL
tomosynthesis views of the right breast were performed. There is
persistence of an oval circumscribed mass in the outer right breast
measuring approximately 0.7 cm. There is a possible additional
obscured mass within dense tissue in the upper outer quadrant.

Left breast: Spot compression tomosynthesis views of the left breast
were performed demonstrating persistence of an irregular mass in the
upper outer left breast measuring approximately 0.9 cm, best seen on
the cc projection.

On physical exam of the left breast I do not feel a fixed discrete
mass.

Ultrasound:

Right breast:

Targeted ultrasound performed in the right breast demonstrating
multiple oval circumscribed anechoic masses consistent with benign
simple cysts. There is a cyst at 9 o'clock 4 cm from the nipple
measuring 1.0 x 0.5 x 0.8 cm. There are additional cysts at 10
o'clock and 10:30 o'clock. No solid mass identified. These benign
cysts correspond to the mammographic findings.

Left breast:

Targeted ultrasound performed in the left breast at 3 o'clock 7 cm
from the nipple demonstrating an irregular hypoechoic mass measuring
1.0 x 0.7 x 1.1 cm. This corresponds to the mammographic
abnormality. There is some associated vascularity. Targeted
ultrasound of the left axilla demonstrates normal lymph nodes.
IMPRESSION: 1. Suspicious mass measuring 1.1 cm in the left breast at 3 o'clock.

2.  Benign simple cysts in the upper outer right breast.

RECOMMENDATION:
Ultrasound-guided core needle biopsy of the left breast mass at 3
o'clock.

I have discussed the findings and recommendations with the patient
who agrees to proceed with biopsy. The patient will be scheduled for
the biopsy prior to leaving our office.

BI-RADS CATEGORY  4: Suspicious.

## 2022-10-09 IMAGING — US US BREAST BX W LOC DEV 1ST LESION IMG BX SPEC US GUIDE*L*
1 series · 12 of 12 positions shown · non-contrast
Comparison: Previous exam(s).
COMPARISON: Previous exam(s).

Addendum:
CLINICAL DATA: Patient presents for ultrasound-guided core needle
biopsy of a left breast mass.

EXAM:
ULTRASOUND GUIDED LEFT BREAST CORE NEEDLE BIOPSY

[Series 1: us breast bx w loc dev 1st lesion img bx spec us g · 0.06mm/px · 12 of 12 slices shown]
[im 1/12]
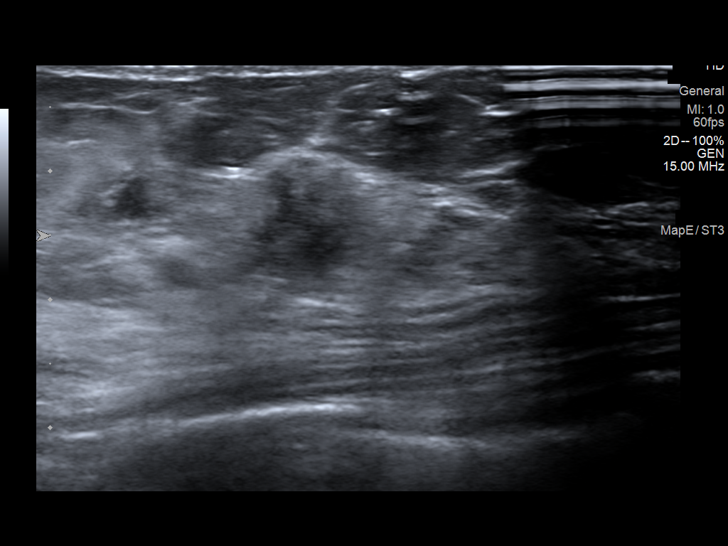
[im 2/12]
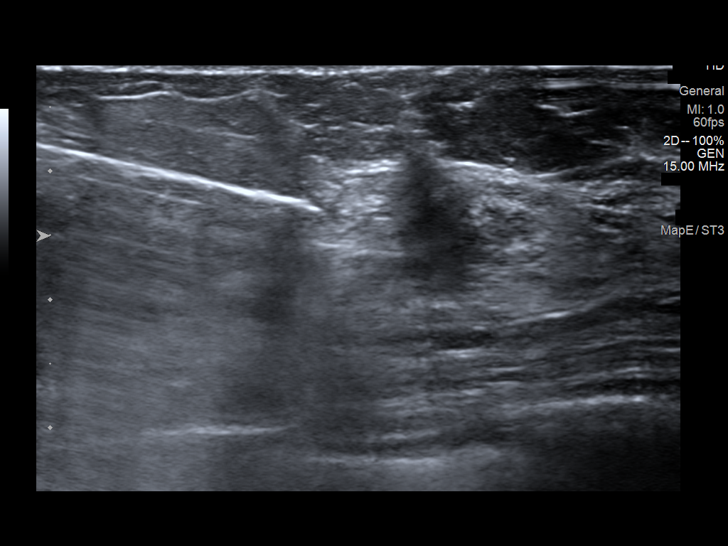
[im 3/12]
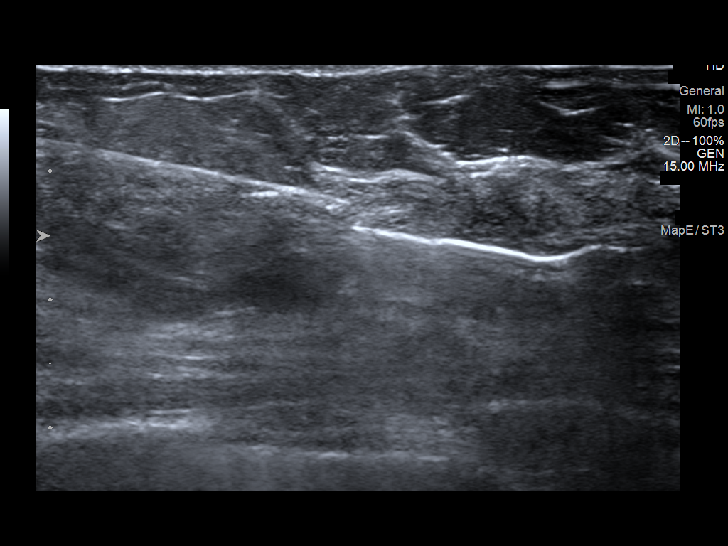
[im 4/12]
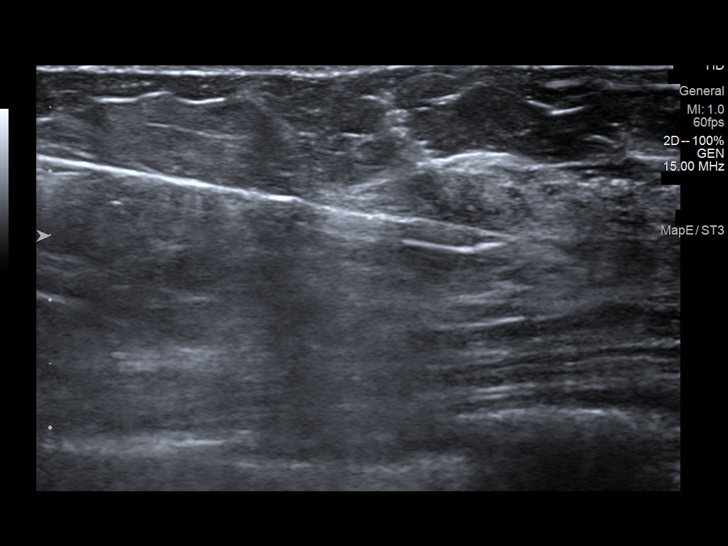
[im 5/12]
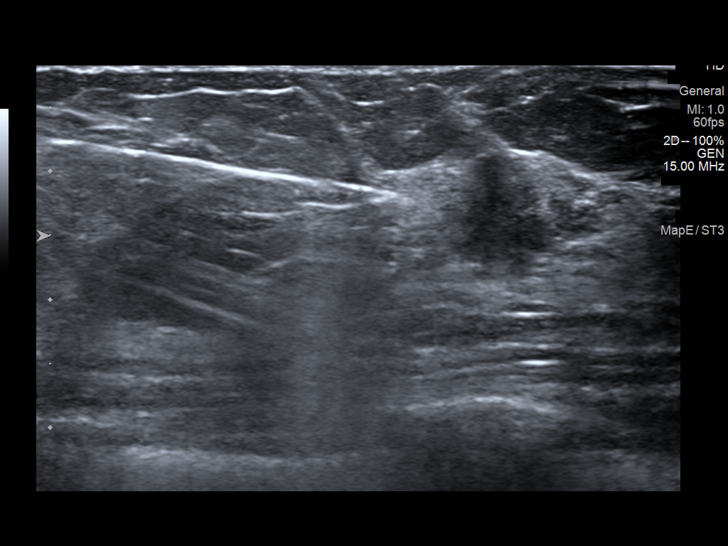
[im 6/12]
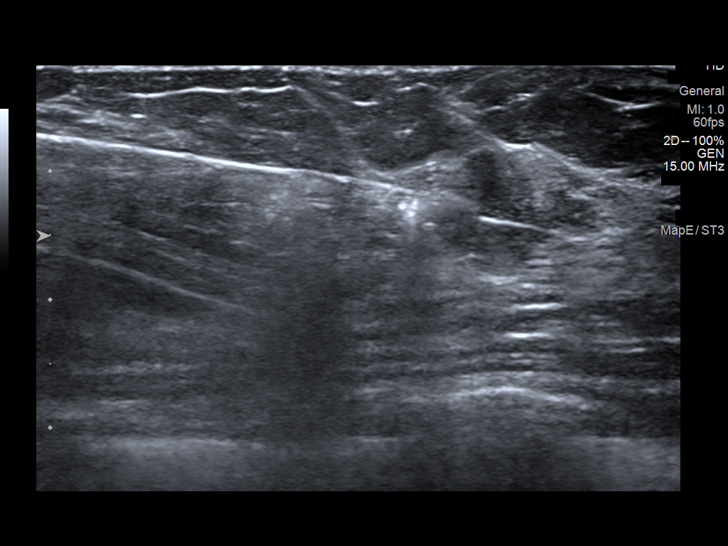
[im 7/12]
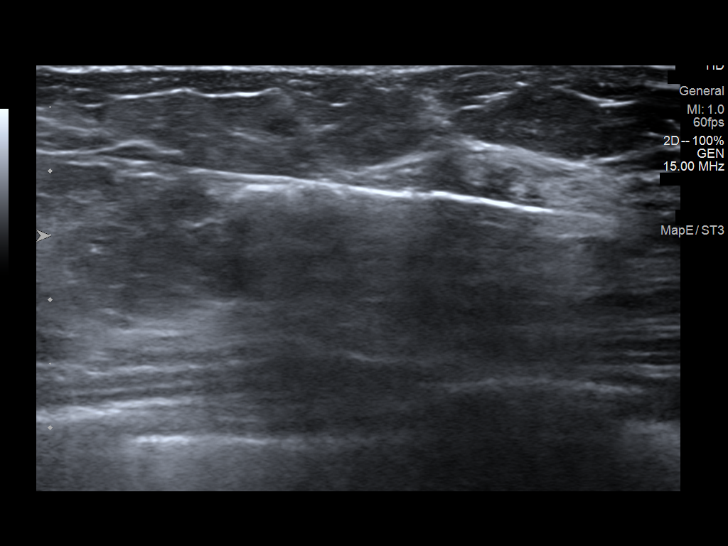
[im 8/12]
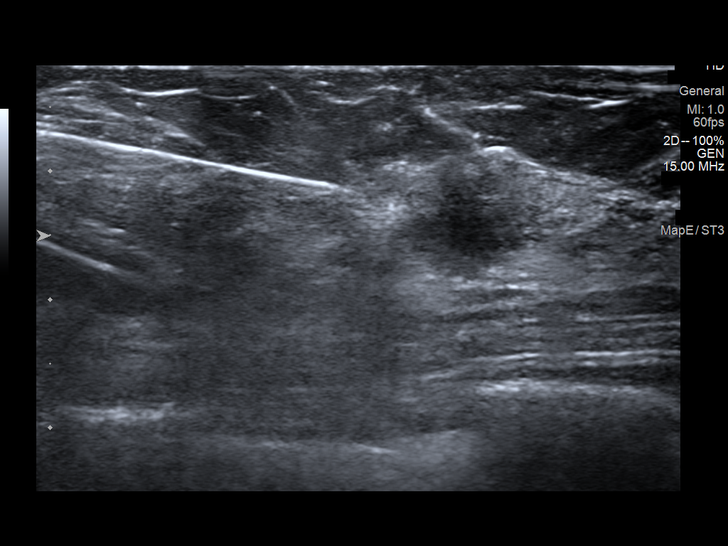
[im 9/12]
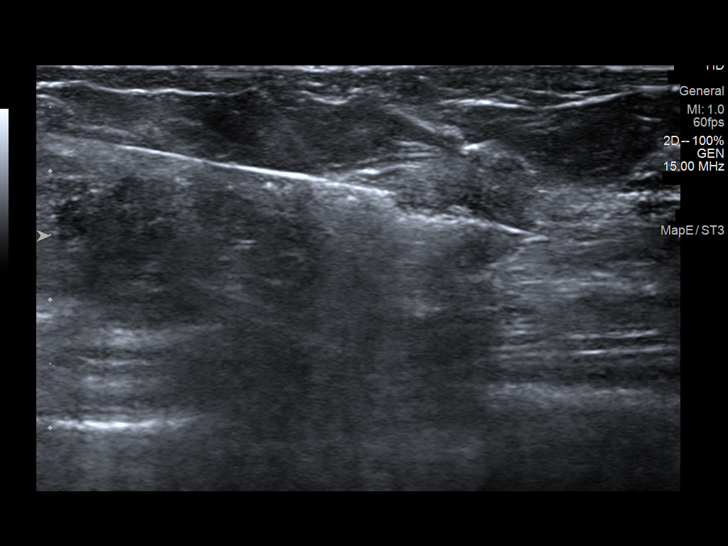
[im 10/12]
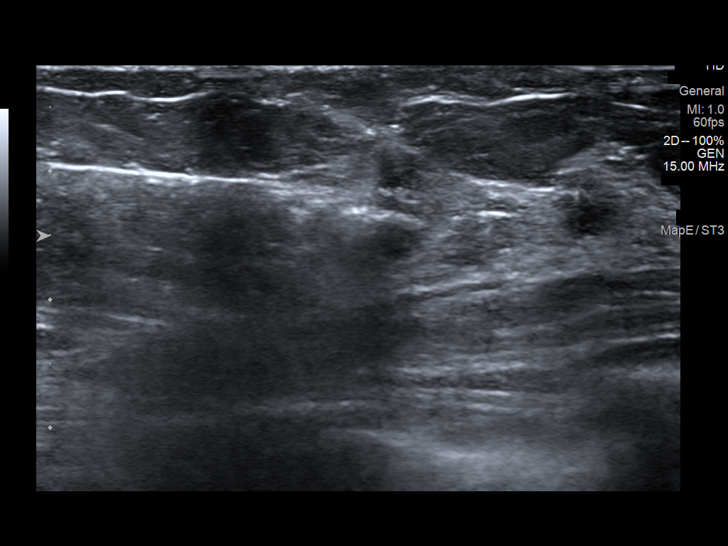
[im 11/12]
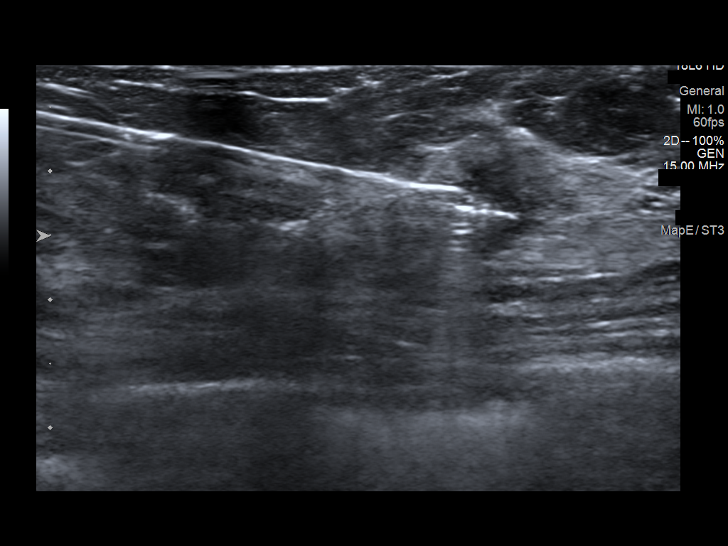
[im 12/12]
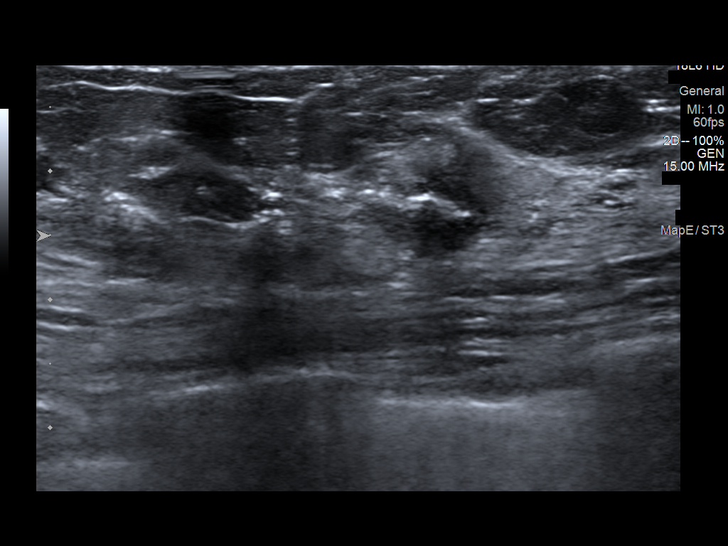

[12 of 12 positions shown; findings below may reference images not displayed]



Lesion quadrant: Upper outer quadrant: Near 3 o'clock, 7 cm from
nipple.

Using sterile technique and 1% Lidocaine as local anesthetic, under
direct ultrasound visualization, a 12 gauge Denilson device was
used to perform biopsy of the 1.1 cm mass using an inferior
approach. At the conclusion of the procedure ribbon shaped tissue
marker clip was deployed into the biopsy cavity. Follow up 2 view
mammogram was performed and dictated separately.
IMPRESSION: Ultrasound guided biopsy of a left breast mass. No apparent
complications.

ADDENDUM:
Pathology revealed GRADE II INVASIVE MAMMARY CARCINOMA, MAMMARY
CARCINOMA IN-SITU of the Left breast, 3 o'clock, 6cmfn. This was
found to be concordant by Dr. Octaviano Jepsen.

Pathology results were discussed with the patient by telephone. The
patient reported doing well after the biopsy with tenderness at the
site. Post biopsy instructions and care were reviewed and questions
were answered. The patient was encouraged to call The [REDACTED] for any additional concerns. My direct phone
number was provided.

The patient was referred to [REDACTED]
[REDACTED] at [REDACTED] on
October 05, 2020.

Pathology results reported by Pepu Lind, RN on 09/28/2020.



Lesion quadrant: Upper outer quadrant: Near 3 o'clock, 7 cm from
nipple.

Using sterile technique and 1% Lidocaine as local anesthetic, under
direct ultrasound visualization, a 12 gauge Denilson device was
used to perform biopsy of the 1.1 cm mass using an inferior
approach. At the conclusion of the procedure ribbon shaped tissue
marker clip was deployed into the biopsy cavity. Follow up 2 view
mammogram was performed and dictated separately.
IMPRESSION: Ultrasound guided biopsy of a left breast mass. No apparent
complications.

## 2022-10-11 DIAGNOSIS — I471 Supraventricular tachycardia, unspecified: Secondary | ICD-10-CM | POA: Diagnosis not present

## 2022-10-11 DIAGNOSIS — I493 Ventricular premature depolarization: Secondary | ICD-10-CM | POA: Diagnosis not present

## 2022-10-17 DIAGNOSIS — S81802D Unspecified open wound, left lower leg, subsequent encounter: Secondary | ICD-10-CM | POA: Diagnosis not present

## 2022-10-17 DIAGNOSIS — D0471 Carcinoma in situ of skin of right lower limb, including hip: Secondary | ICD-10-CM | POA: Diagnosis not present

## 2022-10-18 DIAGNOSIS — R7303 Prediabetes: Secondary | ICD-10-CM | POA: Diagnosis not present

## 2022-10-18 DIAGNOSIS — I10 Essential (primary) hypertension: Secondary | ICD-10-CM | POA: Diagnosis not present

## 2022-10-18 DIAGNOSIS — J3089 Other allergic rhinitis: Secondary | ICD-10-CM | POA: Diagnosis not present

## 2022-10-18 DIAGNOSIS — Z1330 Encounter for screening examination for mental health and behavioral disorders, unspecified: Secondary | ICD-10-CM | POA: Diagnosis not present

## 2022-10-18 DIAGNOSIS — K7469 Other cirrhosis of liver: Secondary | ICD-10-CM | POA: Diagnosis not present

## 2022-10-24 DIAGNOSIS — Z1211 Encounter for screening for malignant neoplasm of colon: Secondary | ICD-10-CM | POA: Diagnosis not present

## 2022-10-31 DIAGNOSIS — S81801A Unspecified open wound, right lower leg, initial encounter: Secondary | ICD-10-CM | POA: Diagnosis not present

## 2022-11-02 LAB — COLOGUARD: COLOGUARD: POSITIVE — AB

## 2022-11-07 DIAGNOSIS — I081 Rheumatic disorders of both mitral and tricuspid valves: Secondary | ICD-10-CM | POA: Diagnosis not present

## 2022-11-15 DIAGNOSIS — S81801A Unspecified open wound, right lower leg, initial encounter: Secondary | ICD-10-CM | POA: Diagnosis not present

## 2022-11-22 DIAGNOSIS — K7460 Unspecified cirrhosis of liver: Secondary | ICD-10-CM | POA: Diagnosis not present

## 2022-11-22 DIAGNOSIS — K7469 Other cirrhosis of liver: Secondary | ICD-10-CM | POA: Diagnosis not present

## 2022-12-10 ENCOUNTER — Other Ambulatory Visit: Payer: Self-pay | Admitting: Nurse Practitioner

## 2022-12-10 DIAGNOSIS — C50412 Malignant neoplasm of upper-outer quadrant of left female breast: Secondary | ICD-10-CM

## 2022-12-10 NOTE — Progress Notes (Unsigned)
Patient Care Team: Macy Mis, MD as PCP - General (Family Medicine) Pershing Proud, RN as Oncology Nurse Navigator Donnelly Angelica, RN as Oncology Nurse Navigator Abigail Miyamoto, MD as Consulting Physician (General Surgery) Dorothy Puffer, MD as Consulting Physician (Radiation Oncology) Stephannie Li, MD as Consulting Physician (Ophthalmology) Andrena Mews, MD as Referring Physician (Gastroenterology) Sherre Scarlet, MD as Referring Physician (Obstetrics and Gynecology) Aris Lot, MD as Consulting Physician (Dermatology) Malachy Mood, MD as Attending Physician (Hematology and Oncology)  Date of Service: 12/12/2022  CHIEF COMPLAINT: Follow up left breast cancer   Oncology History  Malignant neoplasm of upper-outer quadrant of left breast in female, estrogen receptor positive (HCC)  09/30/2020 Initial Diagnosis   Malignant neoplasm of upper-outer quadrant of left breast in female, estrogen receptor positive (HCC)   10/05/2020 Cancer Staging   Staging form: Breast, AJCC 8th Edition - Clinical stage from 10/05/2020: Stage IA (cT1c, cN0, cM0, G2, ER+, PR+, HER2-) - Signed by Lowella Dell, MD on 10/05/2020 Stage prefix: Initial diagnosis Method of lymph node assessment: Clinical Histologic grading system: 3 grade system   10/21/2020 Cancer Staging   Staging form: Breast, AJCC 8th Edition - Pathologic stage from 10/21/2020: Stage IA (pT1c, pN0, cM0, G1, ER+, PR+, HER2-) - Signed by Loa Socks, NP on 11/02/2020 Histologic grading system: 3 grade system      CURRENT THERAPY: Tamoxifen, started 01/30/21  INTERVAL HISTORY Ms. Dowis returns for follow up as scheduled. Last seen by me 06/06/22. She continues tamoxifen, tolerating well except hot flashes which are at least moderate and happen multiple times a day.  She would like medication to help manage this.  Denies concerns in her breast such as new lump/mass, nipple discharge or inversion, or skin change.  Mammogram due in October.  She sees GI Dr. Carrie Mew through [facility] for cirrhosis management and a PCP with [facility].  She was told would not need future colonoscopies but agreed to Cologuard which was positive, she self referred to [facility] for colonoscopy but has not heard back.  She would like her care under one same "umbrella" of [place] (including new PCP and GI).  She continues smoking.  ROS  All other systems reviewed and negative  Past Medical History:  Diagnosis Date   Allergy    Ankle fracture 03/16/2017   right   Breast cancer (HCC)    Bulging lumbar disc    L4 OR L5 TAKES STEROID INJECTIONS FOR TOOK INJECTION MARCH 2019   Cancer (HCC)    Skin CA, removed   Chronic bronchitis (HCC) FEW YEARS AGO   Chronic hepatitis C (HCC)    TOOK HARVONI TX 2017    Colon polyp    COPD (chronic obstructive pulmonary disease) (HCC)    Diverticulitis 31YRS AGO   Dyspnea    WITH HEAVY EXERTION   Emphysema of lung (HCC)    TOOK PULMONARY FUCTION TEST AND PASSED FEW WEEKS AGO AT FAMILY  MD   Gallstones    GERD (gastroesophageal reflux disease)    History of kidney stones 20 YRS AGO    Hypertension    Osteoarthritis    Pneumonia AT BIRTH   Spondylosis of cervical region without myelopathy or radiculopathy      Past Surgical History:  Procedure Laterality Date   ABDOMINAL HYSTERECTOMY  1980   PARTIAL   ANKLE ARTHROSCOPY Right 10/02/2017   Procedure: RIGHT ANKLE ARTHROSCOPY WITH EXTENSIVE DEBRIDEMENT;  Surgeon: Jodi Geralds, MD;  Location: [facility];  Service: Orthopedics;  Laterality: Right;  90 MINUTES FOR CASE   BREAST BIOPSY  02/07/2010   BREAST LUMPECTOMY WITH RADIOACTIVE SEED LOCALIZATION Left 10/21/2020   Procedure: LEFT BREAST LUMPECTOMY WITH RADIOACTIVE SEED LOCALIZATION;  Surgeon: Abigail Miyamoto, MD;  [facility];  Service: General;  Laterality: Left;  60 MINUTES ROOM 2   BREAST SURGERY  2011   biopsy   CHOLECYSTECTOMY  1999   LAPAROSCOPIC   HARDWARE  REMOVAL Right 10/02/2017   Procedure: HARDWARE REMOVAL;  Surgeon: Jodi Geralds, MD;  Location: [facility];  Service: Orthopedics;  Laterality: Right;   INJECTION OF SILICONE OIL Left 01/04/2020   Procedure: INJECTION OF SILICONE OIL;  Surgeon: Stephannie Li, MD;  Location: [facility] OR;  Service: Ophthalmology;  Laterality: Left;   MEMBRANE PEEL Left 01/04/2020   Procedure: MEMBRANE PEEL;  Surgeon: Stephannie Li, MD;  Location: [facility] OR;  Service: Ophthalmology;  Laterality: Left;   ORIF ANKLE FRACTURE Right 03/20/2017   Procedure: OPEN REDUCTION INTERNAL FIXATION (ORIF) ANKLE FRACTURE;  Surgeon: Jodi Geralds, MD;  Location: [facility];  Service: Orthopedics;  Laterality: Right;   PARS PLANA VITRECTOMY Left 01/04/2020   Procedure: PARS PLANA VITRECTOMY WITH 25 GAUGE;  Surgeon: Stephannie Li, MD;  Location: [facility] OR;  Service: Ophthalmology;  Laterality: Left;   PHOTOCOAGULATION WITH LASER Left 01/04/2020   Procedure: PHOTOCOAGULATION WITH LASER;  Surgeon: Stephannie Li, MD;  Location: [facility] OR;  Service: Ophthalmology;  Laterality: Left;   TONSILLECTOMY     TONSILLECTOMY AND ADENOIDECTOMY  1950's     Outpatient Encounter Medications as of 12/12/2022  Medication Sig   albuterol (PROVENTIL HFA;VENTOLIN HFA) 108 (90 BASE) MCG/ACT inhaler Inhale 2 puffs into the lungs every 6 (six) hours as needed for wheezing or shortness of breath.    amLODipine (NORVASC) 5 MG tablet Take 5 mg by mouth every evening.   aspirin EC 81 MG tablet Take 81 mg by mouth daily. Swallow whole.   atorvastatin (LIPITOR) 20 MG tablet Take 20 mg by mouth every evening.    B Complex-Folic Acid (B COMPLEX VITAMINS, W/ FA,) CAPS Take 1 capsule by mouth daily.   Bacillus Coagulans-Inulin (ALIGN PREBIOTIC-PROBIOTIC PO) Take 1 capsule by mouth daily.   cholecalciferol (VITAMIN D3) 25 MCG (1000 UNIT) tablet Take 1,000 Units by mouth daily.   Fluticasone-Umeclidin-Vilant (TRELEGY ELLIPTA) 100-62.5-25 MCG/ACT AEPB Inhale 1  puff into the lungs daily.   GEMTESA 75 MG TABS Take 1 tablet by mouth daily.   ipratropium (ATROVENT) 0.06 % nasal spray Place 2 sprays into both nostrils daily as needed for rhinitis.   levocetirizine (XYZAL) 5 MG tablet    montelukast (SINGULAIR) 10 MG tablet Take 10 mg by mouth every evening.    PREVIDENT 5000 BOOSTER PLUS 1.1 % PSTE Place 1 application onto teeth daily.   [DISCONTINUED] tamoxifen (NOLVADEX) 20 MG tablet Take 1 tablet (20 mg total) by mouth daily.   tamoxifen (NOLVADEX) 20 MG tablet Take 1 tablet (20 mg total) by mouth daily.   [DISCONTINUED] fluticasone furoate-vilanterol (BREO ELLIPTA) 200-25 MCG/INH AEPB Inhale 1 puff into the lungs daily.   No facility-administered encounter medications on file as of 12/12/2022.     Today's Vitals   12/12/22 1027  BP: 139/73  Pulse: 77  Resp: 18  Temp: 98.1 F (36.7 C)  TempSrc: Oral  SpO2: 99%  Weight: 151 lb 9 oz (68.7 kg)  Height: 5' (1.524 m)   Body mass index is 29.6 kg/m.   PHYSICAL EXAM  GENERAL:alert, no distress and comfortable SKIN: no rash  EYES: sclera clear NECK: without mass LYMPH:  no palpable cervical or supraclavicular lymphadenopathy  LUNGS:  normal breathing effort HEART:  no lower extremity edema ABDOMEN: abdomen soft, non-tender and normal bowel sounds NEURO: alert & oriented x 3 with fluent speech, no focal motor/sensory deficits Breast exam: no nipple discharge or inversion. S/p left lumpectomy and radiation. Breast is diffusely firm, no erythema.  No palpable mass or nodularity in either breast or axilla that I could appreciate.   CBC    Component Value Date/Time   WBC 6.4 12/12/2022 0958   WBC 8.0 10/17/2020 1429   RBC 4.20 12/12/2022 0958   HGB 13.1 12/12/2022 0958   HCT 39.3 12/12/2022 0958   PLT 209 12/12/2022 0958   MCV 93.6 12/12/2022 0958   MCH 31.2 12/12/2022 0958   MCHC 33.3 12/12/2022 0958   RDW 13.1 12/12/2022 0958   LYMPHSABS 1.7 12/12/2022 0958   MONOABS 0.6 12/12/2022  0958   EOSABS 0.2 12/12/2022 0958   BASOSABS 0.0 12/12/2022 0958     CMP     Component Value Date/Time   NA 143 12/12/2022 0958   K 4.9 12/12/2022 0958   CL 111 12/12/2022 0958   CO2 27 12/12/2022 0958   GLUCOSE 123 (H) 12/12/2022 0958   BUN 17 12/12/2022 0958   CREATININE 0.81 12/12/2022 0958   CALCIUM 8.8 (L) 12/12/2022 0958   PROT 6.8 12/12/2022 0958   ALBUMIN 3.8 12/12/2022 0958   AST 16 12/12/2022 0958   ALT 9 12/12/2022 0958   ALKPHOS 44 12/12/2022 0958   BILITOT 0.6 12/12/2022 0958   GFRNONAA >60 12/12/2022 0958     ASSESSMENT & PLAN:Ritisha Antionette Kallin is a 77 y.o. post-hysterectomy female with    1. Malignant neoplasm of upper-outer quadrant of left breast, stage IA pT1c, cN0, grade 1 -Diagnosed 08/2020 s/p left lumpectomy 10/21/20 by Dr. 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 Posterior margin involved by DCIS. -S/p adjuvant radiation 12/05/20 - 12/30/20 under Dr. Eddis Hansen. -she was started on tamoxifen on 01/30/21, goal 5 years -Last mammogram 04/2022 was benign -Ms. Napora is clinically doing well. Tolerating Tamoxifen except worsening hot flashes. See #2. Breast exam is benign, labs are unremarkable.  -Overall no clinical concern for recurrence. Continue breast cancer surveillance and AI -F/up in 6 months, or sooner if needed   2. Hot flashes -Secondary to Tamoxifen. She would like to discuss medication -I reviewed effexor vs gabapentin, she does not want an anti-depressant or something that will make her sleepy -Will see if GI feels safe to take Veozah given her cirrhosis, but with normal LFTs/Bili   3. Cirrhosis, Cologuard + -Managed by Dr. Carrie Mew at [facility], with ABD US's and AFP screenings (levels 17, 11 x2) -Found to have +cologuard recently. Per pt told to get colonoscopy at [facility] but she wants to transfer to [facility].  -She self referred to [facility] but hasn't heard back. Requesting referral to [facility] and a new PCP as well  4. Current Smoker, Lung Cancer  Screening -Patient enjoys smoking, not strongly motivated to quit but has tried Chantix in the past, did not tolerate  -She has been screened for lung cancer since 2019. last low-dose screening CT on 01/26/21 was negative. -Lung cancer screening CT 01/29/2022 showed benign appearance/behavior, and possible ILD -Continue lung cancer surveillance, due CT in July (ordered)    PLAN: -Labs reviewed -Continue breast cancer surveillance and Tamoxifen -Will reach out to GI  re: safety of Veozah for hot flashes in the setting of cirrhosis (with normal LFTs) -Mammogram 04/2023 and lung cancer screening CT 12/2022, both ordered  -Referrals to transfer PCP and GI to [facility] per PT -F/u pin 6 months, or sooner if needed   Orders Placed This Encounter  Procedures   MM DIAG BREAST TOMO BILATERAL    Standing Status:   Future    Standing Expiration Date:   12/13/2023    Order Specific Question:   Reason for Exam (SYMPTOM  OR DIAGNOSIS REQUIRED)    Answer:   left lumpectomy; annual    Order Specific Question:   Preferred imaging location?    Answer:   [facility]   CT CHEST LUNG CA SCREEN LOW DOSE W/O CM    Standing Status:   Future    Standing Expiration Date:   12/13/2023    Order Specific Question:   Preferred Imaging Location?    Answer:   [facility]   Ambulatory referral to Gastroenterology    Referral Priority:   Routine    Referral Type:   Consultation    Referral Reason:   Specialty Services Required    Number of Visits Requested:   1   Ambulatory referral to Internal Medicine    Referral Priority:   Routine    Referral Type:   Consultation    Referral Reason:   Patient Preference    Requested Specialty:   Internal Medicine    Number of Visits Requested:   1      All questions were answered. The patient knows to call the clinic with any problems, questions or concerns. No barriers to learning were detected. I spent 20 minutes counseling the patient face to face. The  total time spent in the appointment was 30 minutes and more than 50% was on counseling, review of test results, and coordination of care.   Santiago Glad, NP-C 12/13/2022

## 2022-12-11 DIAGNOSIS — Z7982 Long term (current) use of aspirin: Secondary | ICD-10-CM | POA: Diagnosis not present

## 2022-12-11 DIAGNOSIS — C50919 Malignant neoplasm of unspecified site of unspecified female breast: Secondary | ICD-10-CM | POA: Diagnosis not present

## 2022-12-12 ENCOUNTER — Other Ambulatory Visit: Payer: Self-pay

## 2022-12-12 ENCOUNTER — Inpatient Hospital Stay (HOSPITAL_BASED_OUTPATIENT_CLINIC_OR_DEPARTMENT_OTHER): Payer: Medicare Other | Admitting: Nurse Practitioner

## 2022-12-12 ENCOUNTER — Inpatient Hospital Stay: Payer: Medicare Other | Attending: Nurse Practitioner

## 2022-12-12 VITALS — BP 139/73 | HR 77 | Temp 98.1°F | Resp 18 | Ht 60.0 in | Wt 151.6 lb

## 2022-12-12 DIAGNOSIS — Z170 Estrogen receptor positive status [ER+]: Secondary | ICD-10-CM | POA: Insufficient documentation

## 2022-12-12 DIAGNOSIS — C50412 Malignant neoplasm of upper-outer quadrant of left female breast: Secondary | ICD-10-CM | POA: Insufficient documentation

## 2022-12-12 DIAGNOSIS — F17210 Nicotine dependence, cigarettes, uncomplicated: Secondary | ICD-10-CM | POA: Diagnosis not present

## 2022-12-12 DIAGNOSIS — R195 Other fecal abnormalities: Secondary | ICD-10-CM | POA: Diagnosis not present

## 2022-12-12 DIAGNOSIS — N951 Menopausal and female climacteric states: Secondary | ICD-10-CM | POA: Diagnosis not present

## 2022-12-12 DIAGNOSIS — Z79899 Other long term (current) drug therapy: Secondary | ICD-10-CM | POA: Insufficient documentation

## 2022-12-12 DIAGNOSIS — Z923 Personal history of irradiation: Secondary | ICD-10-CM | POA: Diagnosis not present

## 2022-12-12 DIAGNOSIS — Z79810 Long term (current) use of selective estrogen receptor modulators (SERMs): Secondary | ICD-10-CM | POA: Diagnosis not present

## 2022-12-12 DIAGNOSIS — K7460 Unspecified cirrhosis of liver: Secondary | ICD-10-CM | POA: Diagnosis not present

## 2022-12-12 LAB — CBC WITH DIFFERENTIAL (CANCER CENTER ONLY)
Abs Immature Granulocytes: 0.02 10*3/uL (ref 0.00–0.07)
Basophils Absolute: 0 10*3/uL (ref 0.0–0.1)
Basophils Relative: 1 %
Eosinophils Absolute: 0.2 10*3/uL (ref 0.0–0.5)
Eosinophils Relative: 3 %
HCT: 39.3 % (ref 36.0–46.0)
Hemoglobin: 13.1 g/dL (ref 12.0–15.0)
Immature Granulocytes: 0 %
Lymphocytes Relative: 27 %
Lymphs Abs: 1.7 10*3/uL (ref 0.7–4.0)
MCH: 31.2 pg (ref 26.0–34.0)
MCHC: 33.3 g/dL (ref 30.0–36.0)
MCV: 93.6 fL (ref 80.0–100.0)
Monocytes Absolute: 0.6 10*3/uL (ref 0.1–1.0)
Monocytes Relative: 9 %
Neutro Abs: 3.9 10*3/uL (ref 1.7–7.7)
Neutrophils Relative %: 60 %
Platelet Count: 209 10*3/uL (ref 150–400)
RBC: 4.2 MIL/uL (ref 3.87–5.11)
RDW: 13.1 % (ref 11.5–15.5)
WBC Count: 6.4 10*3/uL (ref 4.0–10.5)
nRBC: 0 % (ref 0.0–0.2)

## 2022-12-12 LAB — CMP (CANCER CENTER ONLY)
ALT: 9 U/L (ref 0–44)
AST: 16 U/L (ref 15–41)
Albumin: 3.8 g/dL (ref 3.5–5.0)
Alkaline Phosphatase: 44 U/L (ref 38–126)
Anion gap: 5 (ref 5–15)
BUN: 17 mg/dL (ref 8–23)
CO2: 27 mmol/L (ref 22–32)
Calcium: 8.8 mg/dL — ABNORMAL LOW (ref 8.9–10.3)
Chloride: 111 mmol/L (ref 98–111)
Creatinine: 0.81 mg/dL (ref 0.44–1.00)
GFR, Estimated: >60 mL/min (ref >60)
Glucose, Bld: 123 mg/dL — ABNORMAL HIGH (ref 70–99)
Potassium: 4.9 mmol/L (ref 3.5–5.1)
Sodium: 143 mmol/L (ref 135–145)
Total Bilirubin: 0.6 mg/dL (ref 0.3–1.2)
Total Protein: 6.8 g/dL (ref 6.5–8.1)

## 2022-12-12 MED ORDER — TAMOXIFEN CITRATE 20 MG PO TABS: 20.0000 mg | ORAL_TABLET | Freq: Every day | ORAL | 3 refills | Status: DC

## 2022-12-13 ENCOUNTER — Encounter: Payer: Self-pay | Admitting: Nurse Practitioner

## 2022-12-13 DIAGNOSIS — H3322 Serous retinal detachment, left eye: Secondary | ICD-10-CM | POA: Diagnosis not present

## 2022-12-13 DIAGNOSIS — Z961 Presence of intraocular lens: Secondary | ICD-10-CM | POA: Diagnosis not present

## 2022-12-13 DIAGNOSIS — H5203 Hypermetropia, bilateral: Secondary | ICD-10-CM | POA: Diagnosis not present

## 2022-12-14 DIAGNOSIS — H524 Presbyopia: Secondary | ICD-10-CM | POA: Diagnosis not present

## 2023-01-09 ENCOUNTER — Other Ambulatory Visit: Payer: Self-pay

## 2023-01-09 ENCOUNTER — Telehealth: Payer: Self-pay | Admitting: Gastroenterology

## 2023-01-09 NOTE — Telephone Encounter (Signed)
Good Morning Dr Adela Lank   We have received a request from patient wanting to transfer care to you from [facility]. Patient has a referral in for positive cologurd.  Records are available for review in epic. Please review and advise on scheduling.  Thank you

## 2023-01-10 NOTE — Telephone Encounter (Signed)
That is fine with me - given her age and comorbidities I would need to see her in the office first, but can schedule an office visit with me.  Of note, she has cirrhosis followed closely by Hepatology. I recommend she continue to see them for cirrhosis but I am happy to do her colonoscopy for her positive Cologuard. Thanks

## 2023-01-15 ENCOUNTER — Other Ambulatory Visit: Payer: Self-pay

## 2023-01-15 ENCOUNTER — Telehealth: Payer: Self-pay

## 2023-01-15 NOTE — Telephone Encounter (Signed)
Pt called requesting medication for severe Hot Flashes.  Pt stated her GI provider Dr. Stark [place] does not want the pt to take Veozah d/t pt's cirrhosis.  Pt's other options are Gabapentin or Effexor.  Pt stated she doesn't want anything that's going to make her sleepy like Gabapentin.  Pt requested to have a prescription for Effexor is possible.  Pt requested if she could get a 90 day Supply d/t it's cheaper than a 30 day supply at her pharmacy.  Pt confirmed [facility] [address] as her preferred pharmacy.  Notified Dr. Mosetta Putt and Santiago Glad, NP of the pt's call.

## 2023-01-16 ENCOUNTER — Other Ambulatory Visit: Payer: Self-pay

## 2023-01-16 ENCOUNTER — Other Ambulatory Visit: Payer: Self-pay | Admitting: Hematology

## 2023-01-16 ENCOUNTER — Telehealth: Payer: Self-pay

## 2023-01-16 MED ORDER — VENLAFAXINE HCL ER 37.5 MG PO CP24: 37.5000 mg | ORAL_CAPSULE | Freq: Every day | ORAL | 0 refills | Status: DC

## 2023-01-16 NOTE — Telephone Encounter (Signed)
LVM for pt stating that Dr. Mosetta Putt sent [facility] a prescription for Effexor 37.5mg  tab to take daily for hot flashes.  Stated Dr. Mosetta Putt would like the pt to take the Effexor for 2wks at 37.5mg  daily.  If the host flashes, does not resolve, Dr. Mosetta Putt would like the pt to increase the dose of the Effexor to 2 tabs (total 75mg ) daily.  Instructed the pt to contact Dr. Latanya Maudlin office if the hot flashes does not improve with the increased dose after 2 wks on the 75mg  total dose.

## 2023-01-22 ENCOUNTER — Other Ambulatory Visit: Payer: Self-pay

## 2023-01-22 NOTE — Telephone Encounter (Signed)
Called patient to schedule. Unable to leave voicmail.

## 2023-01-23 DIAGNOSIS — H30042 Focal chorioretinal inflammation, macular or paramacular, left eye: Secondary | ICD-10-CM | POA: Diagnosis not present

## 2023-01-23 DIAGNOSIS — H35352 Cystoid macular degeneration, left eye: Secondary | ICD-10-CM | POA: Diagnosis not present

## 2023-01-23 DIAGNOSIS — H43811 Vitreous degeneration, right eye: Secondary | ICD-10-CM | POA: Diagnosis not present

## 2023-01-31 DIAGNOSIS — Z720 Tobacco use: Secondary | ICD-10-CM | POA: Diagnosis not present

## 2023-01-31 DIAGNOSIS — N3281 Overactive bladder: Secondary | ICD-10-CM | POA: Diagnosis not present

## 2023-02-01 ENCOUNTER — Other Ambulatory Visit: Payer: Self-pay | Admitting: Physician Assistant

## 2023-02-01 DIAGNOSIS — E2839 Other primary ovarian failure: Secondary | ICD-10-CM

## 2023-02-07 ENCOUNTER — Other Ambulatory Visit: Payer: Self-pay | Admitting: Nurse Practitioner

## 2023-02-07 ENCOUNTER — Ambulatory Visit (HOSPITAL_COMMUNITY)
Admission: RE | Admit: 2023-02-07 | Discharge: 2023-02-07 | Disposition: A | Discharge status: Home / Self Care | Payer: Medicare Other | Source: Ambulatory Visit | Attending: Nurse Practitioner | Admitting: Nurse Practitioner

## 2023-02-07 DIAGNOSIS — R918 Other nonspecific abnormal finding of lung field: Secondary | ICD-10-CM | POA: Diagnosis not present

## 2023-02-07 DIAGNOSIS — R195 Other fecal abnormalities: Secondary | ICD-10-CM

## 2023-02-07 DIAGNOSIS — I700 Atherosclerosis of aorta: Secondary | ICD-10-CM | POA: Diagnosis not present

## 2023-02-07 DIAGNOSIS — Z170 Estrogen receptor positive status [ER+]: Secondary | ICD-10-CM

## 2023-02-07 DIAGNOSIS — J432 Centrilobular emphysema: Secondary | ICD-10-CM | POA: Diagnosis not present

## 2023-02-07 DIAGNOSIS — F17210 Nicotine dependence, cigarettes, uncomplicated: Secondary | ICD-10-CM

## 2023-02-11 ENCOUNTER — Other Ambulatory Visit: Payer: Self-pay | Admitting: Hematology

## 2023-03-08 ENCOUNTER — Other Ambulatory Visit: Payer: Self-pay | Admitting: Pulmonary Disease

## 2023-03-12 DIAGNOSIS — J069 Acute upper respiratory infection, unspecified: Secondary | ICD-10-CM | POA: Diagnosis not present

## 2023-03-12 DIAGNOSIS — B001 Herpesviral vesicular dermatitis: Secondary | ICD-10-CM | POA: Diagnosis not present

## 2023-03-20 ENCOUNTER — Other Ambulatory Visit: Payer: Self-pay | Admitting: Hematology

## 2023-03-27 DIAGNOSIS — L820 Inflamed seborrheic keratosis: Secondary | ICD-10-CM | POA: Diagnosis not present

## 2023-03-27 DIAGNOSIS — L821 Other seborrheic keratosis: Secondary | ICD-10-CM | POA: Diagnosis not present

## 2023-03-27 DIAGNOSIS — L538 Other specified erythematous conditions: Secondary | ICD-10-CM | POA: Diagnosis not present

## 2023-03-27 DIAGNOSIS — Z08 Encounter for follow-up examination after completed treatment for malignant neoplasm: Secondary | ICD-10-CM | POA: Diagnosis not present

## 2023-03-27 DIAGNOSIS — D225 Melanocytic nevi of trunk: Secondary | ICD-10-CM | POA: Diagnosis not present

## 2023-03-27 DIAGNOSIS — D2361 Other benign neoplasm of skin of right upper limb, including shoulder: Secondary | ICD-10-CM | POA: Diagnosis not present

## 2023-03-27 DIAGNOSIS — D2372 Other benign neoplasm of skin of left lower limb, including hip: Secondary | ICD-10-CM | POA: Diagnosis not present

## 2023-03-27 DIAGNOSIS — L814 Other melanin hyperpigmentation: Secondary | ICD-10-CM | POA: Diagnosis not present

## 2023-03-28 DIAGNOSIS — I493 Ventricular premature depolarization: Secondary | ICD-10-CM | POA: Diagnosis not present

## 2023-04-05 ENCOUNTER — Other Ambulatory Visit: Payer: Self-pay | Admitting: Pulmonary Disease

## 2023-04-09 DIAGNOSIS — K7469 Other cirrhosis of liver: Secondary | ICD-10-CM | POA: Diagnosis not present

## 2023-04-09 DIAGNOSIS — B1920 Unspecified viral hepatitis C without hepatic coma: Secondary | ICD-10-CM | POA: Diagnosis not present

## 2023-04-14 ENCOUNTER — Other Ambulatory Visit: Payer: Self-pay | Admitting: Hematology

## 2023-04-15 ENCOUNTER — Other Ambulatory Visit: Payer: Self-pay | Admitting: Nurse Practitioner

## 2023-04-22 ENCOUNTER — Ambulatory Visit
Admission: RE | Admit: 2023-04-22 | Discharge: 2023-04-22 | Disposition: A | Discharge status: Home / Self Care | Payer: Medicare Other | Source: Ambulatory Visit | Attending: Nurse Practitioner | Admitting: Nurse Practitioner

## 2023-04-22 DIAGNOSIS — Z170 Estrogen receptor positive status [ER+]: Secondary | ICD-10-CM

## 2023-04-22 DIAGNOSIS — Z853 Personal history of malignant neoplasm of breast: Secondary | ICD-10-CM | POA: Diagnosis not present

## 2023-04-24 DIAGNOSIS — H26491 Other secondary cataract, right eye: Secondary | ICD-10-CM | POA: Diagnosis not present

## 2023-04-24 DIAGNOSIS — H43811 Vitreous degeneration, right eye: Secondary | ICD-10-CM | POA: Diagnosis not present

## 2023-04-24 DIAGNOSIS — H35352 Cystoid macular degeneration, left eye: Secondary | ICD-10-CM | POA: Diagnosis not present

## 2023-04-24 DIAGNOSIS — H30042 Focal chorioretinal inflammation, macular or paramacular, left eye: Secondary | ICD-10-CM | POA: Diagnosis not present

## 2023-04-30 DIAGNOSIS — B1920 Unspecified viral hepatitis C without hepatic coma: Secondary | ICD-10-CM | POA: Diagnosis not present

## 2023-04-30 DIAGNOSIS — K7469 Other cirrhosis of liver: Secondary | ICD-10-CM | POA: Diagnosis not present

## 2023-04-30 DIAGNOSIS — K7460 Unspecified cirrhosis of liver: Secondary | ICD-10-CM | POA: Diagnosis not present

## 2023-04-30 DIAGNOSIS — Z9049 Acquired absence of other specified parts of digestive tract: Secondary | ICD-10-CM | POA: Diagnosis not present

## 2023-05-07 NOTE — Telephone Encounter (Signed)
Called patient.  She would like to keep her appointment bc she see [facility] for liver disease but her dr there no longer does Colonoscopies and she had a positive Cologuard in April. Patient was not interested in a direct colon as she would like to meet Dr. Adela Lank before the procedure.

## 2023-05-11 ENCOUNTER — Other Ambulatory Visit: Payer: Self-pay | Admitting: Nurse Practitioner

## 2023-05-20 ENCOUNTER — Ambulatory Visit (INDEPENDENT_AMBULATORY_CARE_PROVIDER_SITE_OTHER): Payer: Medicare Other | Admitting: Gastroenterology

## 2023-05-20 ENCOUNTER — Encounter: Payer: Self-pay | Admitting: Gastroenterology

## 2023-05-20 VITALS — BP 110/70 | HR 100 | Ht 60.0 in | Wt 147.0 lb

## 2023-05-20 DIAGNOSIS — K7469 Other cirrhosis of liver: Secondary | ICD-10-CM | POA: Diagnosis not present

## 2023-05-20 DIAGNOSIS — R195 Other fecal abnormalities: Secondary | ICD-10-CM | POA: Diagnosis not present

## 2023-05-20 DIAGNOSIS — K5909 Other constipation: Secondary | ICD-10-CM

## 2023-05-20 DIAGNOSIS — M25561 Pain in right knee: Secondary | ICD-10-CM | POA: Diagnosis not present

## 2023-05-20 DIAGNOSIS — M5416 Radiculopathy, lumbar region: Secondary | ICD-10-CM | POA: Diagnosis not present

## 2023-05-20 MED ORDER — NA SULFATE-K SULFATE-MG SULF 17.5-3.13-1.6 GM/177ML PO SOLN: 1.0000 | Freq: Once | ORAL | 0 refills | Status: AC

## 2023-05-20 MED ORDER — POLYETHYLENE GLYCOL 3350 17 G PO PACK: 17.0000 g | PACK | Freq: Two times a day (BID) | ORAL | Status: DC

## 2023-05-20 MED ORDER — LINACLOTIDE 72 MCG PO CAPS: 72.0000 ug | ORAL_CAPSULE | Freq: Every day | ORAL | 0 refills | Status: DC

## 2023-05-20 NOTE — Progress Notes (Signed)
HPI :  77 year old female with a history of hepatitis C related cirrhosis, positive Cologuard, COPD, history of breast cancer, here to establish care and specifically consideration for colonoscopy.   She had a colonoscopy in 2011 with a small left-sided hyperplastic polyp, no concerning findings.  She has some baseline constipation, has bowel movement once every 3 to 4 days.  Uses Dulcolax as needed, probiotics, or stool softeners.  She thinks she has tried MiraLAX in the past but cannot recall how much it helped.  Sounds like she has tried some Linzess remotely and perhaps that was a bit too strong for her?.  Denies any blood in her stools.  No family history of colon cancer.  She did have a positive Cologuard test in April of this year.  She does have a history of mild COPD, denies any flares of this recently.  Uses her inhalers as scheduled and states she is doing pretty well.  Does not use any supplemental oxygen.  She had an echocardiogram in May of this year showing a normal EF.  She does smoke three quarters of a pack per day cigarettes and continues to smoke.  She follows with Dr. Domenick Gong - [facility] for cirrhosis. She had genotype 1a HCV treated (SVR). She has been managed for compensated cirrhosis based on pre-treatment fibrosis staging determined by [place] elastography in 01/2015 suggested F4 disease (cirrhosis). Multiple elastography studies performed since 2019 suggest low likelihood of advanced fibrosis. Most [place] 11/22/2022 showed no focal lesions, but findings with hepatic cirrhosis. AFP 11.1 on 11/22/2022. She has had no decompensation since she has seen hepatology.  She denies any alcohol use.  Her platelets have been normal.  Hepatology does not think she warrants EGD for screening for varices   Colonoscopy (04/18/2010): 3 mm polyp in the rectum. Diverticulosis in the sigmoid and descending. PATH: Hyperplastic polyp   Past Medical History:  Diagnosis Date   Allergy    Ankle  fracture 03/16/2017   right   Breast cancer (HCC)    Bulging lumbar disc    L4 OR L5 TAKES STEROID INJECTIONS FOR TOOK INJECTION MARCH 2019   Cancer (HCC)    Skin CA, removed   Chronic bronchitis (HCC) FEW YEARS AGO   Chronic hepatitis C (HCC)    TOOK HARVONI TX 2017    Colon polyp    COPD (chronic obstructive pulmonary disease) (HCC)    Diverticulitis 70YRS AGO   Dyspnea    WITH HEAVY EXERTION   Emphysema of lung (HCC)    TOOK PULMONARY FUCTION TEST AND PASSED FEW WEEKS AGO AT FAMILY  MD   Gallstones    GERD (gastroesophageal reflux disease)    History of kidney stones 20 YRS AGO    Hyperlipidemia    Hypertension    Osteoarthritis    Pneumonia AT BIRTH   Spondylosis of cervical region without myelopathy or radiculopathy      Past Surgical History:  Procedure Laterality Date   ABDOMINAL HYSTERECTOMY  1980   PARTIAL   ANKLE ARTHROSCOPY Right 10/02/2017   Procedure: RIGHT ANKLE ARTHROSCOPY WITH EXTENSIVE DEBRIDEMENT;  Surgeon: Jodi Geralds, MD;  Location: [facility];  Service: Orthopedics;  Laterality: Right;  90 MINUTES FOR CASE   BREAST BIOPSY  02/07/2010   BREAST LUMPECTOMY WITH RADIOACTIVE SEED LOCALIZATION Left 10/21/2020   Procedure: LEFT BREAST LUMPECTOMY WITH RADIOACTIVE SEED LOCALIZATION;  Surgeon: Abigail Miyamoto, MD;  Location: [facility] OR;  Service: General;  Laterality: Left;  60 MINUTES ROOM 2  BREAST SURGERY  2011   biopsy   CATARACT EXTRACTION     CHOLECYSTECTOMY  1999   LAPAROSCOPIC   HARDWARE REMOVAL Right 10/02/2017   Procedure: HARDWARE REMOVAL;  Surgeon: Jodi Geralds, MD;  Location: [facility];  Service: Orthopedics;  Laterality: Right;   INJECTION OF SILICONE OIL Left 01/04/2020   Procedure: INJECTION OF SILICONE OIL;  Surgeon: Stephannie Li, MD;  Location[facility];  Service: Ophthalmology;  Laterality: Left;   MEMBRANE PEEL Left 01/04/2020   Procedure: MEMBRANE PEEL;  Surgeon: Stephannie Li, MD;  Location: [facility] OR;   Service: Ophthalmology;  Laterality: Left;   ORIF ANKLE FRACTURE Right 03/20/2017   Procedure: OPEN REDUCTION INTERNAL FIXATION (ORIF) ANKLE FRACTURE;  Surgeon: Jodi Geralds, MD;  Location: [facility];  Service: Orthopedics;  Laterality: Right;   PARS PLANA VITRECTOMY Left 01/04/2020   Procedure: PARS PLANA VITRECTOMY WITH 25 GAUGE;  Surgeon: Stephannie Li, MD;  Location: [facility] OR;  Service: Ophthalmology;  Laterality: Left;   PHOTOCOAGULATION WITH LASER Left 01/04/2020   Procedure: PHOTOCOAGULATION WITH LASER;  Surgeon: Stephannie Li, MD;  Location: [facility] OR;  Service: Ophthalmology;  Laterality: Left;   TONSILLECTOMY     TONSILLECTOMY AND ADENOIDECTOMY  1950's   Family History  Problem Relation Age of Onset   Heart disease Mother    Stroke Brother    Stroke Maternal Uncle    COPD Paternal Uncle    Colon cancer Neg Hx    Stomach cancer Neg Hx    Esophageal cancer Neg Hx    Social History   Tobacco Use   Smoking status: Every Day    Current packs/day: 1.00    Average packs/day: 1 pack/day for 62.8 years (62.8 ttl pk-yrs)    Types: Cigarettes    Start date: 07/20/1960   Smokeless tobacco: Never  Vaping Use   Vaping status: Never Used  Substance Use Topics   Alcohol use: Not Currently    Alcohol/week: 0.0 standard drinks of alcohol    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   Drug use: No   Current Outpatient Medications  Medication Sig Dispense Refill   albuterol (PROVENTIL HFA;VENTOLIN HFA) 108 (90 BASE) MCG/ACT inhaler Inhale 2 puffs into the lungs every 6 (six) hours as needed for wheezing or shortness of breath.      amLODipine (NORVASC) 5 MG tablet Take 5 mg by mouth every evening.     aspirin EC 81 MG tablet Take 81 mg by mouth daily. Swallow whole.     atorvastatin (LIPITOR) 20 MG tablet Take 20 mg by mouth every evening.      B Complex-Folic Acid (B COMPLEX VITAMINS, W/ FA,) CAPS Take 1 capsule by mouth daily.     Bacillus Coagulans-Inulin (ALIGN  PREBIOTIC-PROBIOTIC PO) Take 1 capsule by mouth daily.     cholecalciferol (VITAMIN D3) 25 MCG (1000 UNIT) tablet Take 1,000 Units by mouth daily.     Fluticasone-Umeclidin-Vilant (TRELEGY ELLIPTA) 100-62.5-25 MCG/ACT AEPB INHALE 1 PUFF ONCE DAILY 60 each 11   GEMTESA 75 MG TABS Take 1 tablet by mouth daily.     ipratropium (ATROVENT) 0.06 % nasal spray Place 2 sprays into both nostrils daily as needed for rhinitis.     levocetirizine (XYZAL) 5 MG tablet      montelukast (SINGULAIR) 10 MG tablet Take 10 mg by mouth every evening.      PREVIDENT 5000 BOOSTER PLUS 1.1 % PSTE Place 1 application onto teeth daily.     tamoxifen (  NOLVADEX) 20 MG tablet Take 1 tablet (20 mg total) by mouth daily. 90 tablet 3   venlafaxine XR (EFFEXOR-XR) 37.5 MG 24 hr capsule TAKE 1 CAPSULE BY MOUTH ONCE DAILY WITH BREAKFAST 30 capsule 1   No current facility-administered medications for this visit.   Allergies  Allergen Reactions   Chantix [Varenicline]     Makes her feel crazy   Codeine Itching     Review of Systems: All systems reviewed and negative except where noted in HPI.    Reviewed in [facility]  Physical Exam: BP 110/70   Pulse 100   Ht 5' (1.524 m)   Wt 147 lb (66.7 kg)   BMI 28.71 kg/m  Constitutional: Pleasant,well-developed, female in no acute distress. HEENT: Normocephalic and atraumatic. Conjunctivae are normal. No scleral icterus. Neck supple.  Cardiovascular: Normal rate, regular rhythm.  Pulmonary/chest: Effort normal and breath sounds normal.  Abdominal: Soft, nondistended, nontender. There are no masses palpable. Extremities: no edema Neurological: Alert and oriented to person place and time. Skin: Skin is warm and dry. No rashes noted. Psychiatric: Normal mood and affect. Behavior is normal.   ASSESSMENT: 77 y.o. female here for assessment of the following  1. Positive colorectal cancer screening using Cologuard test   2. Chronic constipation   3. Other cirrhosis  of liver (HCC)    We discussed what Cologuard test is and implications of this result.  Recommend optical colonoscopy to further evaluate.  Discussed risks and benefits of colonoscopy and anesthesia and she wants to proceed.  She feels COPD is well-controlled and no issues with this recently and no issues with anesthesia in the past.  Suspect she more than likely has some polyps in her colon, at risk for this given her tobacco use, further recommendations pending results of the exam.  In light of her chronic constipation we will do prep and a half at least with additional Gatorade/MiraLAX prep as needed.  She does have chronic constipation and we discussed some options to manage that.  She has not tried higher dose MiraLAX yet and would rather start with that as this is effective and cheaper than other alternatives.  I recommend she take it at least twice daily 1 week prior to the prep and titrate up as needed.  I also provided her some samples of Linzess 72 mcg/day at a lower dose to see if she may tolerate that better and provide benefit.  If this works for her can give her a prescription for it.  Hep C otherwise eradicated, she is following closely with hepatology at [facility], compensated cirrhosis, she will continue to see them for her cirrhosis care   PLAN: - colonoscopy at [facility] - prep 1 & 1/2 prep with additional gatorade prep PRN - take Miralax twice daily and titrate up as needed, last least one week prior to prep - samples of Linzess / day PRN - can give script if needed - compensated cirrhosis, will continue to see hepatology  Harlin Rain, MD  Gastroenterology  CC: Macy Mis, MD

## 2023-05-20 NOTE — Patient Instructions (Addendum)
You have been scheduled for a colonoscopy. Please follow written instructions given to you at your visit today.   Please pick up your prep supplies at the pharmacy within the next 1-3 days.  If you use inhalers (even only as needed), please bring them with you on the day of your procedure.  DO NOT TAKE 7 DAYS PRIOR TO TEST- Trulicity (dulaglutide) Ozempic, Wegovy (semaglutide) Mounjaro (tirzepatide) Bydureon Bcise (exanatide extended release)  DO NOT TAKE 1 DAY PRIOR TO YOUR TEST Rybelsus (semaglutide) Adlyxin (lixisenatide) Victoza (liraglutide) Byetta (exanatide) ___________________________________________________________________________  Please purchase the following medications over the counter and take as directed: Miralax: Take twice a day as directed. Titrate as needed    If Miralax is not effective:   We have given you samples of the following medication to take: Linzess 72 mcg: Take once daily 30 minutes before a meal  PLEASE MAKE SURE  you are moving your bowels week at least 5 days prior to starting the prep for your procedure.  Thank you for entrusting me with your care and for choosing [facility], Dr. Ileene Patrick

## 2023-05-28 DIAGNOSIS — E782 Mixed hyperlipidemia: Secondary | ICD-10-CM | POA: Diagnosis not present

## 2023-05-28 DIAGNOSIS — I10 Essential (primary) hypertension: Secondary | ICD-10-CM | POA: Diagnosis not present

## 2023-05-28 DIAGNOSIS — Z0000 Encounter for general adult medical examination without abnormal findings: Secondary | ICD-10-CM | POA: Diagnosis not present

## 2023-05-28 DIAGNOSIS — R195 Other fecal abnormalities: Secondary | ICD-10-CM | POA: Insufficient documentation

## 2023-05-28 DIAGNOSIS — Z720 Tobacco use: Secondary | ICD-10-CM | POA: Diagnosis not present

## 2023-06-05 DIAGNOSIS — M5416 Radiculopathy, lumbar region: Secondary | ICD-10-CM | POA: Diagnosis not present

## 2023-06-10 ENCOUNTER — Other Ambulatory Visit: Payer: Self-pay

## 2023-06-10 ENCOUNTER — Ambulatory Visit: Payer: Medicare Other | Admitting: Internal Medicine

## 2023-06-10 DIAGNOSIS — Z170 Estrogen receptor positive status [ER+]: Secondary | ICD-10-CM

## 2023-06-11 ENCOUNTER — Encounter: Payer: Self-pay | Admitting: Nurse Practitioner

## 2023-06-11 ENCOUNTER — Inpatient Hospital Stay: Payer: Medicare Other | Attending: Nurse Practitioner

## 2023-06-11 ENCOUNTER — Inpatient Hospital Stay (HOSPITAL_BASED_OUTPATIENT_CLINIC_OR_DEPARTMENT_OTHER): Payer: Medicare Other | Admitting: Nurse Practitioner

## 2023-06-11 VITALS — BP 128/75 | HR 94 | Temp 98.3°F | Resp 18 | Wt 146.8 lb

## 2023-06-11 DIAGNOSIS — Z90710 Acquired absence of both cervix and uterus: Secondary | ICD-10-CM | POA: Insufficient documentation

## 2023-06-11 DIAGNOSIS — Z170 Estrogen receptor positive status [ER+]: Secondary | ICD-10-CM | POA: Insufficient documentation

## 2023-06-11 DIAGNOSIS — C50412 Malignant neoplasm of upper-outer quadrant of left female breast: Secondary | ICD-10-CM

## 2023-06-11 DIAGNOSIS — Z79810 Long term (current) use of selective estrogen receptor modulators (SERMs): Secondary | ICD-10-CM | POA: Insufficient documentation

## 2023-06-11 DIAGNOSIS — Z923 Personal history of irradiation: Secondary | ICD-10-CM | POA: Insufficient documentation

## 2023-06-11 LAB — CBC WITH DIFFERENTIAL (CANCER CENTER ONLY)
Abs Immature Granulocytes: 0.01 10*3/uL (ref 0.00–0.07)
Basophils Absolute: 0 10*3/uL (ref 0.0–0.1)
Basophils Relative: 0 %
Eosinophils Absolute: 0.2 10*3/uL (ref 0.0–0.5)
Eosinophils Relative: 2 %
HCT: 41 % (ref 36.0–46.0)
Hemoglobin: 13.8 g/dL (ref 12.0–15.0)
Immature Granulocytes: 0 %
Lymphocytes Relative: 25 %
Lymphs Abs: 1.9 10*3/uL (ref 0.7–4.0)
MCH: 31.4 pg (ref 26.0–34.0)
MCHC: 33.7 g/dL (ref 30.0–36.0)
MCV: 93.2 fL (ref 80.0–100.0)
Monocytes Absolute: 0.5 10*3/uL (ref 0.1–1.0)
Monocytes Relative: 6 %
Neutro Abs: 5 10*3/uL (ref 1.7–7.7)
Neutrophils Relative %: 67 %
Platelet Count: 244 10*3/uL (ref 150–400)
RBC: 4.4 MIL/uL (ref 3.87–5.11)
RDW: 12.7 % (ref 11.5–15.5)
WBC Count: 7.6 10*3/uL (ref 4.0–10.5)
nRBC: 0 % (ref 0.0–0.2)

## 2023-06-11 LAB — CMP (CANCER CENTER ONLY)
ALT: 9 U/L (ref 0–44)
AST: 15 U/L (ref 15–41)
Albumin: 4 g/dL (ref 3.5–5.0)
Alkaline Phosphatase: 46 U/L (ref 38–126)
Anion gap: 5 (ref 5–15)
BUN: 14 mg/dL (ref 8–23)
CO2: 30 mmol/L (ref 22–32)
Calcium: 9.4 mg/dL (ref 8.9–10.3)
Chloride: 106 mmol/L (ref 98–111)
Creatinine: 1.17 mg/dL — ABNORMAL HIGH (ref 0.44–1.00)
GFR, Estimated: 48 mL/min — ABNORMAL LOW (ref >60)
Glucose, Bld: 137 mg/dL — ABNORMAL HIGH (ref 70–99)
Potassium: 4.9 mmol/L (ref 3.5–5.1)
Sodium: 141 mmol/L (ref 135–145)
Total Bilirubin: 0.6 mg/dL (ref <1.2)
Total Protein: 6.9 g/dL (ref 6.5–8.1)

## 2023-06-11 MED ORDER — VENLAFAXINE HCL ER 37.5 MG PO CP24: 37.5000 mg | ORAL_CAPSULE | Freq: Every day | ORAL | 1 refills | Status: DC

## 2023-06-11 NOTE — Progress Notes (Signed)
Patient Care Team: Macy Mis, MD as PCP - General (Family Medicine) Pershing Proud, RN as Oncology Nurse Navigator Donnelly Angelica, RN as Oncology Nurse Navigator Abigail Miyamoto, MD as Consulting Physician (General Surgery) Dorothy Puffer, MD as Consulting Physician (Radiation Oncology) Stephannie Li, MD as Consulting Physician (Ophthalmology) Andrena Mews, MD as Referring Physician (Gastroenterology) Sherre Scarlet, MD as Referring Physician (Obstetrics and Gynecology) Aris Lot, MD as Consulting Physician (Dermatology) Malachy Mood, MD as Attending Physician (Hematology and Oncology)   CHIEF COMPLAINT: Follow-up left breast cancer  Oncology History  Malignant neoplasm of upper-outer quadrant of left breast in female, estrogen receptor positive (HCC)  09/30/2020 Initial Diagnosis   Malignant neoplasm of upper-outer quadrant of left breast in female, estrogen receptor positive (HCC)   10/05/2020 Cancer Staging   Staging form: Breast, AJCC 8th Edition - Clinical stage from 10/05/2020: Stage IA (cT1c, cN0, cM0, G2, ER+, PR+, HER2-) - Signed by Lowella Dell, MD on 10/05/2020 Stage prefix: Initial diagnosis Method of lymph node assessment: Clinical Histologic grading system: 3 grade system   10/21/2020 Cancer Staging   Staging form: Breast, AJCC 8th Edition - Pathologic stage from 10/21/2020: Stage IA (pT1c, pN0, cM0, G1, ER+, PR+, HER2-) - Signed by Loa Socks, NP on 11/02/2020 Histologic grading system: 3 grade system      CURRENT THERAPY: Tamoxifen, started 01/30/2021  INTERVAL HISTORY Ms. [name] returns for follow-up as scheduled, last seen by me 12/12/2022.  Mammogram 04/22/2023 was benign.  Has occasional left outer breast discomfort with twisting, at the scar tissue.  Denies new lump/mass, nipple discharge or inversion, or skin change.  Tolerating tamoxifen.  Colonoscopy scheduled around Christmas.  Seeing pulmonology in January. Stopped aspirin  due to bruising.   ROS  All other systems reviewed and negative  Past Medical History:  Diagnosis Date   Allergy    Ankle fracture 03/16/2017   right   Breast cancer (HCC)    Bulging lumbar disc    L4 OR L5 TAKES STEROID INJECTIONS FOR TOOK INJECTION MARCH 2019   Cancer (HCC)    Skin CA, removed   Chronic bronchitis (HCC) FEW YEARS AGO   Chronic hepatitis C (HCC)    TOOK HARVONI [place] 2017    Colon polyp    COPD (chronic obstructive pulmonary disease) (HCC)    Diverticulitis 14YRS AGO   Dyspnea    WITH HEAVY EXERTION   Emphysema of lung (HCC)    TOOK PULMONARY FUCTION TEST AND PASSED FEW WEEKS AGO AT FAMILY  MD   Gallstones    GERD (gastroesophageal reflux disease)    History of kidney stones 20 YRS AGO    Hyperlipidemia    Hypertension    Osteoarthritis    Pneumonia AT BIRTH   Spondylosis of cervical region without myelopathy or radiculopathy      Past Surgical History:  Procedure Laterality Date   ABDOMINAL HYSTERECTOMY  1980   PARTIAL   ANKLE ARTHROSCOPY Right 10/02/2017   Procedure: RIGHT ANKLE ARTHROSCOPY WITH EXTENSIVE DEBRIDEMENT;  Surgeon: Jodi Geralds, MD;  Location: [facility];  Service: Orthopedics;  Laterality: Right;  90 MINUTES FOR CASE   BREAST BIOPSY  02/07/2010   BREAST LUMPECTOMY WITH RADIOACTIVE SEED LOCALIZATION Left 10/21/2020   Procedure: LEFT BREAST LUMPECTOMY WITH RADIOACTIVE SEED LOCALIZATION;  Surgeon: Abigail Miyamoto, MD;  Location: [facility] OR;  Service: General;  Laterality: Left;  60 MINUTES ROOM 2   BREAST SURGERY  2011   biopsy   CATARACT  EXTRACTION     CHOLECYSTECTOMY  1999   LAPAROSCOPIC   HARDWARE REMOVAL Right 10/02/2017   Procedure: HARDWARE REMOVAL;  Surgeon: Jodi Geralds, MD;  Location: [facility];  Service: Orthopedics;  Laterality: Right;   INJECTION OF SILICONE OIL Left 01/04/2020   Procedure: INJECTION OF SILICONE OIL;  Surgeon: Stephannie Li, MD;  Location: [facility] OR;  Service: Ophthalmology;   Laterality: Left;   MEMBRANE PEEL Left 01/04/2020   Procedure: MEMBRANE PEEL;  Surgeon: Stephannie Li, MD;  Location: [facility] OR;  Service: Ophthalmology;  Laterality: Left;   ORIF ANKLE FRACTURE Right 03/20/2017   Procedure: OPEN REDUCTION INTERNAL FIXATION (ORIF) ANKLE FRACTURE;  Surgeon: Jodi Geralds, MD;  Location: [facility];  Service: Orthopedics;  Laterality: Right;   PARS PLANA VITRECTOMY Left 01/04/2020   Procedure: PARS PLANA VITRECTOMY WITH 25 GAUGE;  Surgeon: Stephannie Li, MD;  Location: [facility] OR;  Service: Ophthalmology;  Laterality: Left;   PHOTOCOAGULATION WITH LASER Left 01/04/2020   Procedure: PHOTOCOAGULATION WITH LASER;  Surgeon: Stephannie Li, MD;  Location: [facility] OR;  Service: Ophthalmology;  Laterality: Left;   TONSILLECTOMY     TONSILLECTOMY AND ADENOIDECTOMY  1950's     Outpatient Encounter Medications as of 06/11/2023  Medication Sig   albuterol (PROVENTIL HFA;VENTOLIN HFA) 108 (90 BASE) MCG/ACT inhaler Inhale 2 puffs into the lungs every 6 (six) hours as needed for wheezing or shortness of breath.    amLODipine (NORVASC) 5 MG tablet Take 5 mg by mouth every evening.   atorvastatin (LIPITOR) 20 MG tablet Take 20 mg by mouth every evening.    B Complex-Folic Acid (B COMPLEX VITAMINS, W/ FA,) CAPS Take 1 capsule by mouth daily.   cholecalciferol (VITAMIN D3) 25 MCG (1000 UNIT) tablet Take 1,000 Units by mouth daily.   Fluticasone-Umeclidin-Vilant (TRELEGY ELLIPTA) 100-62.5-25 MCG/ACT AEPB INHALE 1 PUFF ONCE DAILY   GEMTESA 75 MG TABS Take 1 tablet by mouth daily.   ipratropium (ATROVENT) 0.06 % nasal spray Place 2 sprays into both nostrils daily as needed for rhinitis.   levocetirizine (XYZAL) 5 MG tablet    linaclotide (LINZESS) 72 MCG capsule Take 1 capsule (72 mcg total) by mouth daily before breakfast.   montelukast (SINGULAIR) 10 MG tablet Take 10 mg by mouth every evening.    polyethylene glycol (MIRALAX) 17 g packet Take 17 g by mouth 2 (two) times  daily. Titrate as needed   PREVIDENT 5000 BOOSTER PLUS 1.1 % PSTE Place 1 application onto teeth daily.   tamoxifen (NOLVADEX) 20 MG tablet Take 1 tablet (20 mg total) by mouth daily.   [DISCONTINUED] venlafaxine XR (EFFEXOR-XR) 37.5 MG 24 hr capsule TAKE 1 CAPSULE BY MOUTH ONCE DAILY WITH BREAKFAST   aspirin EC 81 MG tablet Take 81 mg by mouth daily. Swallow whole. (Patient not taking: Reported on 06/11/2023)   venlafaxine XR (EFFEXOR-XR) 37.5 MG 24 hr capsule Take 1 capsule (37.5 mg total) by mouth daily with breakfast.   [DISCONTINUED] Bacillus Coagulans-Inulin (ALIGN PREBIOTIC-PROBIOTIC PO) Take 1 capsule by mouth daily. (Patient not taking: Reported on 06/11/2023)   No facility-administered encounter medications on file as of 06/11/2023.     Today's Vitals   06/11/23 1143 06/11/23 1147  BP: 128/75   Pulse: 94   Resp: 18   Temp: 98.3 F (36.8 C)   TempSrc: Temporal   SpO2: 98%   Weight: 146 lb 12.8 oz (66.6 kg)   PainSc:  0-No pain   Body mass index is 28.67 kg/m.   PHYSICAL  EXAM GENERAL:alert, no distress and comfortable SKIN: no rash  EYES: sclera clear NECK: without mass LYMPH:  no palpable cervical or supraclavicular lymphadenopathy  LUNGS: clear with normal breathing effort HEART: regular rate & rhythm, no lower extremity edema ABDOMEN: abdomen soft, non-tender and normal bowel sounds NEURO: alert & oriented x 3 with fluent speech, no focal motor/sensory deficits Breast exam: S/p left lumpectomy, incisions completely healed with moderate/firm scar tissue at the lower outer breast.  No palpable mass or nodularity in either breast or axilla that I could appreciate   CBC    Component Value Date/Time   WBC 7.6 06/11/2023 1118   WBC 8.0 10/17/2020 1429   RBC 4.40 06/11/2023 1118   HGB 13.8 06/11/2023 1118   HCT 41.0 06/11/2023 1118   PLT 244 06/11/2023 1118   MCV 93.2 06/11/2023 1118   MCH 31.4 06/11/2023 1118   MCHC 33.7 06/11/2023 1118   RDW 12.7 06/11/2023  1118   LYMPHSABS 1.9 06/11/2023 1118   MONOABS 0.5 06/11/2023 1118   EOSABS 0.2 06/11/2023 1118   BASOSABS 0.0 06/11/2023 1118     CMP     Component Value Date/Time   NA 141 06/11/2023 1118   K 4.9 06/11/2023 1118   CL 106 06/11/2023 1118   CO2 30 06/11/2023 1118   GLUCOSE 137 (H) 06/11/2023 1118   BUN 14 06/11/2023 1118   CREATININE 1.17 (H) 06/11/2023 1118   CALCIUM 9.4 06/11/2023 1118   PROT 6.9 06/11/2023 1118   ALBUMIN 4.0 06/11/2023 1118   AST 15 06/11/2023 1118   ALT 9 06/11/2023 1118   ALKPHOS 46 06/11/2023 1118   BILITOT 0.6 06/11/2023 1118   GFRNONAA 48 (L) 06/11/2023 1118     ASSESSMENT & PLAN:[name] is a 77 y.o. post-hysterectomy female with    1. Malignant neoplasm of upper-outer quadrant of left breast, stage IA pT1c, cN0, grade 1 -Diagnosed 08/2020 s/p left lumpectomy 10/21/20 by Dr. 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 Posterior margin involved by DCIS. -S/p adjuvant radiation 12/05/20 - 12/30/20 under Dr. Denim Hansen. -she was started on tamoxifen on 01/30/21, goal 5 years -[name] appears stable. Tolerating Tamoxifen, exam is benign, labs are stable, recent mammogram 04/2023 was benign. Overall no clinical concern for recurrence.  -Continue surveillance and Tamoxifen -F/up in 6 months, or sooner if needed   2. Hot flashes -Secondary to Tamoxifen. -Hepatology discouraged Veozah due to cirrhosis -I reviewed effexor vs gabapentin, she does not want something that will make her sleepy.  -She ultimately started effexor and it is helpful   3. Cirrhosis, Cologuard + -Managed by Dr. Carrie Mew at [facility], with ABD US's and AFP screenings (levels 17, 11 x2) -Found to have +cologuard, established with Dr. Adela Lank and planning colonoscopy 06/21/23   4. Current Smoker, Lung Cancer Screening -Patient enjoys smoking, not strongly motivated to quit but has tried Chantix in the past, did not tolerate  -She has been screened for lung cancer since 2019. last  low-dose screening CT on 01/26/21 was negative. -Lung cancer screening CT 01/29/2022 showed benign appearance/behavior, and possible ILD -Continue lung cancer surveillance, CT chest 02/07/23 was benign -Encouraged smoking reduction/cessation.  -F/up pulm in 07/2023  5.  Health maintenance -Experienced bruising with aspirin so she stopped it a couple months ago. OK to try every other day   PLAN: -Recent mammogram and today's labs reviewed -Continue breast cancer surveillance and Tamoxifen -Health maintenance: Colonoscopy 06/2023 ([facility], for +cologuard), smoking cessation, and f/up pulm 07/2023 -F/up in  6 months, or sooner if needed    All questions were answered. The patient knows to call the clinic with any problems, questions or concerns. No barriers to learning were detected. I spent 20 minutes counseling the patient face to face. The total time spent in the appointment was 30 minutes and more than 50% was on counseling, review of test results, and coordination of care.   Santiago Glad, NP-C 06/11/2023

## 2023-06-21 ENCOUNTER — Encounter: Payer: Self-pay | Admitting: Gastroenterology

## 2023-06-21 ENCOUNTER — Ambulatory Visit: Payer: Medicare Other | Admitting: Gastroenterology

## 2023-06-21 VITALS — BP 136/72 | HR 75 | Temp 98.0°F | Resp 12 | Ht 60.0 in | Wt 147.0 lb

## 2023-06-21 DIAGNOSIS — D123 Benign neoplasm of transverse colon: Secondary | ICD-10-CM | POA: Diagnosis not present

## 2023-06-21 DIAGNOSIS — D122 Benign neoplasm of ascending colon: Secondary | ICD-10-CM

## 2023-06-21 DIAGNOSIS — Z1211 Encounter for screening for malignant neoplasm of colon: Secondary | ICD-10-CM | POA: Diagnosis not present

## 2023-06-21 DIAGNOSIS — Q439 Congenital malformation of intestine, unspecified: Secondary | ICD-10-CM | POA: Diagnosis not present

## 2023-06-21 DIAGNOSIS — K562 Volvulus: Secondary | ICD-10-CM

## 2023-06-21 DIAGNOSIS — K648 Other hemorrhoids: Secondary | ICD-10-CM

## 2023-06-21 DIAGNOSIS — K5730 Diverticulosis of large intestine without perforation or abscess without bleeding: Secondary | ICD-10-CM | POA: Diagnosis not present

## 2023-06-21 DIAGNOSIS — R195 Other fecal abnormalities: Secondary | ICD-10-CM

## 2023-06-21 DIAGNOSIS — D125 Benign neoplasm of sigmoid colon: Secondary | ICD-10-CM | POA: Diagnosis not present

## 2023-06-21 MED ORDER — SODIUM CHLORIDE 0.9 % IV SOLN: 500.0000 mL | INTRAVENOUS | Status: DC

## 2023-06-21 NOTE — Progress Notes (Signed)
[facility] History and Physical   Primary Care Physician:  Macy Mis, MD   Reason for Procedure:   Positive Cologuard test  Plan:    colonoscopy     HPI: [name] is a 77 y.o. female  here for colonoscopy to evaluate positive Cologuard from earlier this year. Last colonoscopy 2011.   Patient has chronic constipation, on Miralax and Linzess. No family history of colon cancer known. Otherwise feels well without any cardiopulmonary symptoms.   I have discussed risks / benefits of anesthesia and endoscopic procedure with Nyesha Bing Quarry and they wish to proceed with the exams as outlined today.    Past Medical History:  Diagnosis Date   Allergy    Ankle fracture 03/16/2017   right   Breast cancer (HCC)    Bulging lumbar disc    L4 OR L5 TAKES STEROID INJECTIONS FOR TOOK INJECTION MARCH 2019   Cancer (HCC)    Skin CA, removed   Chronic bronchitis (HCC) FEW YEARS AGO   Chronic hepatitis C (HCC)    TOOK [place] 2017    Colon polyp    COPD (chronic obstructive pulmonary disease) (HCC)    Diverticulitis 74YRS AGO   Dyspnea    WITH HEAVY EXERTION   Emphysema of lung (HCC)    TOOK PULMONARY FUCTION TEST AND PASSED FEW WEEKS AGO AT [facility]ILY  MD   Gallstones    GERD (gastroesophageal reflux disease)    History of kidney stones 20 YRS AGO    Hyperlipidemia    Hypertension    Osteoarthritis    Pneumonia AT BIRTH   Spondylosis of cervical region without myelopathy or radiculopathy     Past Surgical History:  Procedure Laterality Date   ABDOMINAL HYSTERECTOMY  1980   PARTIAL   ANKLE ARTHROSCOPY Right 10/02/2017   Procedure: RIGHT ANKLE ARTHROSCOPY WITH EXTENSIVE DEBRIDEMENT;  Surgeon: Jodi Geralds, MD;  Location: [facility];  Service: Orthopedics;  Laterality: Right;  90 MINUTES FOR CASE   BREAST BIOPSY  02/07/2010   BREAST LUMPECTOMY WITH RADIOACTIVE SEED LOCALIZATION Left 10/21/2020   Procedure: LEFT BREAST LUMPECTOMY WITH  RADIOACTIVE SEED LOCALIZATION;  Surgeon: Abigail Miyamoto, MD;  Location: [facility] OR;  Service: General;  Laterality: Left;  60 MINUTES ROOM 2   BREAST SURGERY  2011   biopsy   CATARACT EXTRACTION     CHOLECYSTECTOMY  1999   LAPAROSCOPIC   HARDWARE REMOVAL Right 10/02/2017   Procedure: HARDWARE REMOVAL;  Surgeon: Jodi Geralds, MD;  Location: [facility];  Service: Orthopedics;  Laterality: Right;   INJECTION OF SILICONE OIL Left 01/04/2020   Procedure: INJECTION OF SILICONE OIL;  Surgeon: Stephannie Li, MD;  Location: [facility] OR;  Service: Ophthalmology;  Laterality: Left;   MEMBRANE PEEL Left 01/04/2020   Procedure: MEMBRANE PEEL;  Surgeon: Stephannie Li, MD;  Location: [facility] OR;  Service: Ophthalmology;  Laterality: Left;   ORIF ANKLE FRACTURE Right 03/20/2017   Procedure: OPEN REDUCTION INTERNAL FIXATION (ORIF) ANKLE FRACTURE;  Surgeon: Jodi Geralds, MD;  Location: [facility];  Service: Orthopedics;  Laterality: Right;   PARS PLANA VITRECTOMY Left 01/04/2020   Procedure: PARS PLANA VITRECTOMY WITH 25 GAUGE;  Surgeon: Stephannie Li, MD;  Location: [facility] OR;  Service: Ophthalmology;  Laterality: Left;   PHOTOCOAGULATION WITH LASER Left 01/04/2020   Procedure: PHOTOCOAGULATION WITH LASER;  Surgeon: Stephannie Li, MD;  Location: [facility] OR;  Service: Ophthalmology;  Laterality: Left;   TONSILLECTOMY     TONSILLECTOMY AND  ADENOIDECTOMY  1950's    Prior to Admission medications   Medication Sig Start Date End Date Taking? Authorizing Provider  albuterol (PROVENTIL HFA;VENTOLIN HFA) 108 (90 BASE) MCG/ACT inhaler Inhale 2 puffs into the lungs every 6 (six) hours as needed for wheezing or shortness of breath.     [provider]  amLODipine (NORVASC) 5 MG tablet Take 5 mg by mouth every evening. 11/11/14   [provider]  aspirin EC 81 MG tablet Take 81 mg by mouth daily. Swallow whole. Patient not taking: Reported on 06/11/2023    [provider]   atorvastatin (LIPITOR) 20 MG tablet Take 20 mg by mouth every evening.     [provider]  B Complex-Folic Acid (B COMPLEX VITAMINS, W/ FA,) CAPS Take 1 capsule by mouth daily. 05/19/21   [provider]  cholecalciferol (VITAMIN D3) 25 MCG (1000 UNIT) tablet Take 1,000 Units by mouth daily.    [provider]  Fluticasone-Umeclidin-Vilant (TRELEGY ELLIPTA) 100-62.5-25 MCG/ACT AEPB INHALE 1 PUFF ONCE DAILY 04/05/23   Martina Sinner, MD  GEMTESA 75 MG TABS Take 1 tablet by mouth daily.    [provider]  ipratropium (ATROVENT) 0.06 % nasal spray Place 2 sprays into both nostrils daily as needed for rhinitis.    [provider]  levocetirizine (XYZAL) 5 MG tablet  10/18/22   [provider]  linaclotide Karlene Einstein) 72 MCG capsule Take 1 capsule (72 mcg total) by mouth daily before breakfast. 05/20/23   Christen Bedoya, Willaim Rayas, MD  montelukast (SINGULAIR) 10 MG tablet Take 10 mg by mouth every evening.     [provider]  polyethylene glycol (MIRALAX) 17 g packet Take 17 g by mouth 2 (two) times daily. Titrate as needed 05/20/23   Atara Paterson, Willaim Rayas, MD  PREVIDENT 5000 BOOSTER PLUS 1.1 % PSTE Place 1 application onto teeth daily. 06/29/20   [provider]  tamoxifen (NOLVADEX) 20 MG tablet Take 1 tablet (20 mg total) by mouth daily. 12/12/22   Pollyann Samples, NP  venlafaxine XR (EFFEXOR-XR) 37.5 MG 24 hr capsule Take 1 capsule (37.5 mg total) by mouth daily with breakfast. 06/11/23   Pollyann Samples, NP    Current Outpatient Medications  Medication Sig Dispense Refill   albuterol (PROVENTIL HFA;VENTOLIN HFA) 108 (90 BASE) MCG/ACT inhaler Inhale 2 puffs into the lungs every 6 (six) hours as needed for wheezing or shortness of breath.      amLODipine (NORVASC) 5 MG tablet Take 5 mg by mouth every evening.     aspirin EC 81 MG tablet Take 81 mg by mouth daily. Swallow whole. (Patient not taking: Reported on 06/11/2023)      atorvastatin (LIPITOR) 20 MG tablet Take 20 mg by mouth every evening.      B Complex-Folic Acid (B COMPLEX VITAMINS, W/ FA,) CAPS Take 1 capsule by mouth daily.     cholecalciferol (VITAMIN D3) 25 MCG (1000 UNIT) tablet Take 1,000 Units by mouth daily.     Fluticasone-Umeclidin-Vilant (TRELEGY ELLIPTA) 100-62.5-25 MCG/ACT AEPB INHALE 1 PUFF ONCE DAILY 60 each 11   GEMTESA 75 MG TABS Take 1 tablet by mouth daily.     ipratropium (ATROVENT) 0.06 % nasal spray Place 2 sprays into both nostrils daily as needed for rhinitis.     levocetirizine (XYZAL) 5 MG tablet      linaclotide (LINZESS) 72 MCG capsule Take 1 capsule (72 mcg total) by mouth daily before breakfast. 8 capsule 0   montelukast (SINGULAIR)  10 MG tablet Take 10 mg by mouth every evening.      polyethylene glycol (MIRALAX) 17 g packet Take 17 g by mouth 2 (two) times daily. Titrate as needed     PREVIDENT 5000 BOOSTER PLUS 1.1 % PSTE Place 1 application onto teeth daily.     tamoxifen (NOLVADEX) 20 MG tablet Take 1 tablet (20 mg total) by mouth daily. 90 tablet 3   venlafaxine XR (EFFEXOR-XR) 37.5 MG 24 hr capsule Take 1 capsule (37.5 mg total) by mouth daily with breakfast. 90 capsule 1   Current Facility-Administered Medications  Medication Dose Route Frequency Provider Last Rate Last Admin   0.9 %  sodium chloride infusion  500 mL Intravenous Continuous Herberth Deharo, Willaim Rayas, MD        Allergies as of 06/21/2023 - Review Complete 06/21/2023  Allergen Reaction Noted   Chantix [varenicline] Other (See Comments) 12/25/2011   Codeine Itching 12/25/2011    Family History  Problem Relation Age of Onset   Heart disease Mother    Stroke Brother    Stroke Maternal Uncle    COPD Paternal Uncle    Colon cancer Neg Hx    Stomach cancer Neg Hx    Esophageal cancer Neg Hx     Social History   Socioeconomic History   Marital status: Legally Separated    Spouse name: Not on file   Number of children: 0   Years of education: Not on  file   Highest education level: Not on file  Occupational History   Occupation: retired   Occupation: retired  Tobacco Use   Smoking status: Every Day    Current packs/day: 1.00    Average packs/day: 1 pack/day for 62.9 years (62.9 ttl pk-yrs)    Types: Cigarettes    Start date: 07/20/1960   Smokeless tobacco: Never  Vaping Use   Vaping status: Never Used  Substance and Sexual Activity   Alcohol use: Not Currently    Alcohol/week: 0.0 standard drinks of alcohol    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   Drug use: No   Sexual activity: Not on file  Other Topics Concern   Not on file  Social History Narrative   Not on file   Social Drivers of Health   Financial Resource Strain: Low Risk  (06/06/2023)   Overall Financial Resource Strain (CARDIA)    Difficulty of Paying Living Expenses: Not very hard  Recent Concern: Financial Resource Strain - Medium Risk (05/25/2023)   Received from [facility]   Overall Financial Resource Strain (CARDIA)    Difficulty of Paying Living Expenses: Somewhat hard  Food Insecurity: Food Insecurity Present (06/06/2023)   Hunger Vital Sign    Worried About Running Out of Food in the Last Year: Sometimes true    Ran Out of Food in the Last Year: Sometimes true  Transportation Needs: No Transportation Needs (06/06/2023)   PRAPARE - Administrator, Civil Service (Medical): No    Lack of Transportation (Non-Medical): No  Physical Activity: Sufficiently Active (06/06/2023)   Exercise Vital Sign    Days of Exercise per Week: 4 days    Minutes of Exercise per Session: 60 min  Stress: No Stress Concern Present (06/06/2023)   [employer] of Occupational Health - Occupational Stress Questionnaire    Feeling of Stress : Only a little  Social Connections: Moderately Isolated (06/06/2023)   Social Connection and Isolation Panel [NHANES]    Frequency of Communication with Friends and  Family: More than three times a week    Frequency of  Social Gatherings with Friends and Family: Three times a week    Attends Religious Services: More than 4 times per year    Active Member of Clubs or Organizations: No    Attends Banker Meetings: Not on file    Marital Status: Divorced  Intimate Partner Violence: Not At Risk (05/25/2023)   Received from [facility]   HITS    Over the last 12 months how often did your partner physically hurt you?: Never    Over the last 12 months how often did your partner insult you or talk down to you?: Never    Over the last 12 months how often did your partner threaten you with physical harm?: Never    Over the last 12 months how often did your partner scream or curse at you?: Never    Review of Systems: All other review of systems negative except as mentioned in the HPI.  Physical Exam: Vital signs BP 136/67   Pulse 87   Temp 98 F (36.7 C) (Temporal)   Ht 5' (1.524 m)   Wt 147 lb (66.7 kg)   SpO2 97%   BMI 28.71 kg/m   General:   Alert,  Well-developed, pleasant and cooperative in NAD Lungs:  Clear throughout to auscultation.   Heart:  Regular rate and rhythm Abdomen:  Soft, nontender and nondistended.   Neuro/Psych:  Alert and cooperative. Normal mood and affect. A and O x 3  Harlin Rain, MD [facility]

## 2023-06-21 NOTE — Patient Instructions (Addendum)
Recommendation:           - Patient has a contact number available for                            emergencies. The signs and symptoms of potential                            delayed complications were discussed with the                            patient. Return to normal activities tomorrow.                            Written discharge instructions were provided to the                            patient.                           - Resume previous diet.                           - Continue present medications.                           - Await pathology results.  YOU HAD AN ENDOSCOPIC PROCEDURE TODAY AT THE [facility]:   Refer to the procedure report that was given to you for any specific questions about what was found during the examination.  If the procedure report does not answer your questions, please call your gastroenterologist to clarify.  If you requested that your care partner not be given the details of your procedure findings, then the procedure report has been included in a sealed envelope for you to review at your convenience later.  YOU SHOULD EXPECT: Some feelings of bloating in the abdomen. Passage of more gas than usual.  Walking can help get rid of the air that was put into your GI tract during the procedure and reduce the bloating. If you had a lower endoscopy (such as a colonoscopy or flexible sigmoidoscopy) you may notice spotting of blood in your stool or on the toilet paper. If you underwent a bowel prep for your procedure, you may not have a normal bowel movement for a few days.  Please Note:  You might notice some irritation and congestion in your nose or some drainage.  This is from the oxygen used during your procedure.  There is no need for concern and it should clear up in a day or so.  SYMPTOMS TO REPORT IMMEDIATELY:  Following lower endoscopy (colonoscopy or flexible sigmoidoscopy):  Excessive amounts of blood in the stool  Significant tenderness or  worsening of abdominal pains  Swelling of the abdomen that is new, acute  Fever of 100F or higher  For urgent or emergent issues, a gastroenterologist can be reached at any hour by calling [phone]. Do not use MyChart messaging for urgent concerns.    DIET:  We do recommend a small meal at first, but then you may proceed to your regular diet.  Drink plenty of fluids but you should avoid alcoholic beverages for 24 hours.  ACTIVITY:  You should plan to take it easy for the rest of today and you should NOT DRIVE or use heavy machinery until tomorrow (because of the sedation medicines used during the test).    FOLLOW UP: Our staff will call the number listed on your records the next business day following your procedure.  We will call around 7:15- 8:00 am to check on you and address any questions or concerns that you may have regarding the information given to you following your procedure. If we do not reach you, we will leave a message.     If any biopsies were taken you will be contacted by phone or by letter within the next 1-3 weeks.  Please call us at [phone] if you have not heard about the biopsies in 3 weeks.    SIGNATURES/CONFIDENTIALITY: You and/or your care partner have signed paperwork which will be entered into your electronic medical record.  These signatures attest to the fact that that the information above on your After Visit Summary has been reviewed and is understood.  Full responsibility of the confidentiality of this discharge information lies with you and/or your care-partner.

## 2023-06-21 NOTE — Progress Notes (Signed)
Sedate, gd SR, tolerated procedure well, VSS, report to RN 

## 2023-06-21 NOTE — Op Note (Signed)
[facility] Patient Name: [name] Procedure Date: 06/21/2023 1:49 PM MRN: [id number] Endoscopist: Viviann Spare P. Adela Lank , MD, [id number] Age: 77 Referring MD:  Date of Birth: 1946/04/07 Gender: Female Account #: [id number] Procedure:                Colonoscopy Indications:              Positive Cologuard test Medicines:                Monitored Anesthesia Care Procedure:                Pre-Anesthesia Assessment:                           - Prior to the procedure, a History and Physical                            was performed, and patient medications and                            allergies were reviewed. The patient's tolerance of                            previous anesthesia was also reviewed. The risks                            and benefits of the procedure and the sedation                            options and risks were discussed with the patient.                            All questions were answered, and informed consent                            was obtained. Prior Anticoagulants: The patient has                            taken no anticoagulant or antiplatelet agents. ASA                            Grade Assessment: II - A patient with mild systemic                            disease. After reviewing the risks and benefits,                            the patient was deemed in satisfactory condition to                            undergo the procedure.                           After obtaining informed consent, the colonoscope  was passed under direct vision. Throughout the                            procedure, the patient's blood pressure, pulse, and                            oxygen saturations were monitored continuously. The                            [id number]-HQ190L Colonoscope [id number] was introduced                            through the anus and advanced to the the cecum,                            identified by appendiceal orifice  and ileocecal                            valve. The colonoscopy was performed without                            difficulty. The patient tolerated the procedure                            well. The quality of the bowel preparation was                            adequate. The ileocecal valve, appendiceal orifice,                            and rectum were photographed. Scope In: 2:00:05 PM Scope Out: 2:30:30 PM Scope Withdrawal Time: 0 hours 22 minutes 52 seconds  Total Procedure Duration: 0 hours 30 minutes 25 seconds  Findings:                 The perianal and digital rectal examinations were                            normal.                           Three sessile polyps were found in the ascending                            colon. The polyps were 3 to 4 mm in size. These                            polyps were removed with a cold snare. Resection                            and retrieval were complete.                           Two sessile polyps were found in the transverse  colon. The polyps were 3 to 4 mm in size. These                            polyps were removed with a cold snare. Resection                            and retrieval were complete.                           An 8 mm polyp was found in the splenic flexure. The                            polyp was sessile. The polyp was removed with a                            cold snare. Resection and retrieval were complete.                           A 3 mm polyp was found in the sigmoid colon. The                            polyp was sessile. The polyp was removed with a                            cold snare. Resection and retrieval were complete.                           The colon was quite tortuous with looping.                           Many diverticula were found in the transverse colon                            and left colon.                           Internal hemorrhoids were found during  retroflexion.                           The exam was otherwise without abnormality. Prep                            was adequate but several minutes spent lavaging the                            colon to achieve adequate views. Complications:            No immediate complications. Estimated blood loss:                            Minimal. Estimated Blood Loss:     Estimated blood loss was minimal. Impression:               - Three 3 to 4 mm  polyps in the ascending colon,                            removed with a cold snare. Resected and retrieved.                           - Two 3 to 4 mm polyps in the transverse colon,                            removed with a cold snare. Resected and retrieved.                           - One 8 mm polyp at the splenic flexure, removed                            with a cold snare. Resected and retrieved.                           - One 3 mm polyp in the sigmoid colon, removed with                            a cold snare. Resected and retrieved.                           - Tortuous colon.                           - Diverticulosis in the transverse colon and in the                            left colon.                           - Internal hemorrhoids.                           - The examination was otherwise normal. Recommendation:           - Patient has a contact number available for                            emergencies. The signs and symptoms of potential                            delayed complications were discussed with the                            patient. Return to normal activities tomorrow.                            Written discharge instructions were provided to the                            patient.                           -  Resume previous diet.                           - Continue present medications.                           - Await pathology results. Viviann Spare P. Adela Lank, MD 06/21/2023 2:36:43 PM This report has been signed  electronically.

## 2023-06-21 NOTE — Progress Notes (Signed)
Called to room to assist during endoscopic procedure.  Patient ID and intended procedure confirmed with present staff. Received instructions for my participation in the procedure from the performing physician.  

## 2023-06-24 ENCOUNTER — Telehealth: Payer: Self-pay

## 2023-06-24 NOTE — Telephone Encounter (Signed)
  Follow up Call-     06/21/2023    1:48 PM  Call back number  Post procedure Call Back phone  # [phone]  Permission to leave phone message Yes     Patient questions:  Do you have a fever, pain , or abdominal swelling? No. Pain Score  0 *  Have you tolerated food without any problems? Yes.    Have you been able to return to your normal activities? Yes.    Do you have any questions about your discharge instructions: Diet   No. Medications  No. Follow up visit  No.  Do you have questions or concerns about your Care? Yes.    Patient inquired about follow up appointment and treatment for hemorrhoids.  Actions: * If pain score is 4 or above: No action needed, pain <4.

## 2023-06-24 NOTE — Telephone Encounter (Signed)
All patient questions answered to patient understanding.

## 2023-06-24 NOTE — Progress Notes (Signed)
RN provided answers to patient questions about follow up and care for hemorrhoids in post-procedure follow up calls. Patient stated understanding.

## 2023-06-27 LAB — SURGICAL PATHOLOGY

## 2023-07-11 ENCOUNTER — Ambulatory Visit: Payer: Medicare Other | Admitting: Internal Medicine

## 2023-07-11 ENCOUNTER — Ambulatory Visit (INDEPENDENT_AMBULATORY_CARE_PROVIDER_SITE_OTHER): Payer: Medicare Other | Admitting: Internal Medicine

## 2023-07-11 ENCOUNTER — Encounter: Payer: Self-pay | Admitting: Internal Medicine

## 2023-07-11 VITALS — BP 136/84 | HR 93 | Temp 98.1°F | Ht 60.0 in | Wt 149.0 lb

## 2023-07-11 DIAGNOSIS — J449 Chronic obstructive pulmonary disease, unspecified: Secondary | ICD-10-CM

## 2023-07-11 DIAGNOSIS — I10 Essential (primary) hypertension: Secondary | ICD-10-CM

## 2023-07-11 DIAGNOSIS — R7301 Impaired fasting glucose: Secondary | ICD-10-CM

## 2023-07-11 DIAGNOSIS — E782 Mixed hyperlipidemia: Secondary | ICD-10-CM | POA: Insufficient documentation

## 2023-07-11 DIAGNOSIS — K219 Gastro-esophageal reflux disease without esophagitis: Secondary | ICD-10-CM | POA: Diagnosis not present

## 2023-07-11 DIAGNOSIS — K5909 Other constipation: Secondary | ICD-10-CM | POA: Diagnosis not present

## 2023-07-11 DIAGNOSIS — Z853 Personal history of malignant neoplasm of breast: Secondary | ICD-10-CM | POA: Insufficient documentation

## 2023-07-11 DIAGNOSIS — B009 Herpesviral infection, unspecified: Secondary | ICD-10-CM | POA: Diagnosis not present

## 2023-07-11 DIAGNOSIS — N3281 Overactive bladder: Secondary | ICD-10-CM

## 2023-07-11 DIAGNOSIS — Z720 Tobacco use: Secondary | ICD-10-CM | POA: Insufficient documentation

## 2023-07-11 LAB — LIPID PANEL
Cholesterol: 135 mg/dL (ref 0–200)
HDL: 54.7 mg/dL (ref >39.00)
LDL Cholesterol: 68 mg/dL (ref 0–99)
NonHDL: 80.57
Total CHOL/HDL Ratio: 2
Triglycerides: 61 mg/dL (ref 0.0–149.0)
VLDL: 12.2 mg/dL (ref 0.0–40.0)

## 2023-07-11 LAB — HEMOGLOBIN A1C: Hgb A1c MFr Bld: 6.2 % (ref 4.6–6.5)

## 2023-07-11 MED ORDER — VALACYCLOVIR HCL 1 G PO TABS: 1000.0000 mg | ORAL_TABLET | Freq: Two times a day (BID) | ORAL | 2 refills | Status: AC

## 2023-07-11 NOTE — Progress Notes (Signed)
 [facility] [address] [place] [address] Dept: [phone] Dept Fax: [phone]  New Patient Office Visit  Subjective:   [name] 06-04-1946 07/11/2023  Chief Complaint  Patient presents with   Establish Care    HPI: [name] presents today to establish care at [facility]. Introduced to publishing rights manager role and practice setting.  All questions answered.  Concerns: See below   Discussed the use of AI scribe software for clinical note transcription with the patient, who gave verbal consent to proceed.  History of Present Illness   The patient, with a history of high blood pressure, COPD, cirrhosis acid reflux, constipation, breast cancer, high cholesterol, and overactive bladder, is establishing care with a new provider. The patient is currently on amlodipine  for high blood pressure, Trelegy, Singulair , and albuterol  for COPD, Linzess  and Miralax  for constipation, tamoxifen  for breast cancer, atorvastatin  for high cholesterol, and Myrbetriq  and Sanctura  for overactive bladder. The patient is also a current smoker,  has a history of smoking one pack a day for about sixty years. Has no intention or desire to quit. Last CT lung screening was August 2024, which did show 4mm lung nodule. Is due for repeat scan in August 2025. She does see pulmonology.   The patient also mentions that she has been diagnosed with hepatitis C in the past and underwent infusion treatment, but her current GI doctor is unsure if she actually had the condition. The patient is currently undergoing regular ultrasounds and blood tests for cirrhosis. The patient also mentions that she has been vaccinated for hepatitis B.   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 Does need refill.   The patient also mentions that her glucose levels were high in her last blood test and she  is aware that she is borderline diabetic.       The following portions of the patient's history were reviewed and updated as appropriate: past medical history, past surgical history, family history, social history, allergies, medications, and problem list.   Patient Active Problem List   Diagnosis Date Noted   Mixed hyperlipidemia 07/11/2023   Tobacco use 07/11/2023   Herpes simplex type 1 infection 07/11/2023   Prediabetes 03/10/2021   Overactive bladder 11/02/2019   Aortic atherosclerosis (HCC) 03/24/2019   Localized osteoarthritis of right ankle 10/02/2017   Retained orthopedic hardware 10/02/2017   Urge incontinence of urine 07/24/2016   Fibrosis of liver 02/01/2015   Spondylosis of cervical region without myelopathy or radiculopathy 11/02/2014   Depression with anxiety 12/25/2011   HTN (hypertension) 12/25/2011   GERD (gastroesophageal reflux disease) 12/25/2011   COPD, moderate (HCC) 12/25/2011   Chronic constipation 12/25/2011   Seasonal allergic rhinitis 10/02/2011   Osteoarthritis, knee 11/16/2010   Past Medical History:  Diagnosis Date   Allergy    Asthma    Atypical chest pain 05/29/2011   Normal cardiology eval: ECHO and stress testing 2012.  Cardiac cath: normal 2014 for worsening dyspnea     Bimalleolar fracture of right ankle 03/20/2017   Breast cancer (HCC)    Bulging lumbar disc    L4 OR L5 TAKES STEROID INJECTIONS FOR TOOK INJECTION MARCH 2019   Chronic bronchitis (HCC) FEW YEARS AGO   Chronic hepatitis C (HCC)    TOOK HARVONI TX 2017    Colon polyp    COPD (chronic obstructive pulmonary disease) (HCC)    Diverticulitis 24YRS AGO  Emphysema of lung (HCC)    TOOK PULMONARY FUCTION TEST AND PASSED FEW WEEKS AGO AT FAMILY  MD   Gallstones    GERD (gastroesophageal reflux disease)    History of kidney stones 20 YRS AGO    Hormone replacement therapy (postmenopausal) 08/22/2012   Hyperlipidemia    Hypertension    Malignant neoplasm of upper-outer  quadrant of left breast in female, estrogen receptor positive (HCC) 09/30/2020   Osteoarthritis    Pneumonia AT BIRTH   Spondylosis of cervical region without myelopathy or radiculopathy    Squamous cell carcinoma of skin 06/15/2013   Arms and leg; [facility], Dr. Lynnell     Past Surgical History:  Procedure Laterality Date   ABDOMINAL HYSTERECTOMY  1980   PARTIAL   ANKLE ARTHROSCOPY Right 10/02/2017   Procedure: RIGHT ANKLE ARTHROSCOPY WITH EXTENSIVE DEBRIDEMENT;  Surgeon: Yvone Rush, MD;  Location: [facility];  Service: Orthopedics;  Laterality: Right;  90 MINUTES FOR CASE   BREAST BIOPSY  02/07/2010   BREAST LUMPECTOMY WITH RADIOACTIVE SEED LOCALIZATION Left 10/21/2020   Procedure: LEFT BREAST LUMPECTOMY WITH RADIOACTIVE SEED LOCALIZATION;  Surgeon: Vernetta Berg, MD;  Location: [facility] OR;  Service: General;  Laterality: Left;  60 MINUTES ROOM 2   BREAST SURGERY  2011   biopsy   CATARACT EXTRACTION     CHOLECYSTECTOMY  1999   LAPAROSCOPIC   EYE SURGERY     FRACTURE SURGERY     HARDWARE REMOVAL Right 10/02/2017   Procedure: HARDWARE REMOVAL;  Surgeon: Yvone Rush, MD;  Location: [facility] ;  Service: Orthopedics;  Laterality: Right;   INJECTION OF SILICONE OIL Left 01/04/2020   Procedure: INJECTION OF SILICONE OIL;  Surgeon: Jarold Mayo, MD;  Location: [facility] OR;  Service: Ophthalmology;  Laterality: Left;   MEMBRANE PEEL Left 01/04/2020   Procedure: MEMBRANE PEEL;  Surgeon: Jarold Mayo, MD;  Location: [facility] OR;  Service: Ophthalmology;  Laterality: Left;   ORIF ANKLE FRACTURE Right 03/20/2017   Procedure: OPEN REDUCTION INTERNAL FIXATION (ORIF) ANKLE FRACTURE;  Surgeon: Yvone Rush, MD;  Location: [facility];  Service: Orthopedics;  Laterality: Right;   PARS PLANA VITRECTOMY Left 01/04/2020   Procedure: PARS PLANA VITRECTOMY WITH 25 GAUGE;  Surgeon: Jarold Mayo, MD;  Location: [facility] OR;  Service: Ophthalmology;   Laterality: Left;   PHOTOCOAGULATION WITH LASER Left 01/04/2020   Procedure: PHOTOCOAGULATION WITH LASER;  Surgeon: Jarold Mayo, MD;  Location: [facility] OR;  Service: Ophthalmology;  Laterality: Left;   TONSILLECTOMY     TONSILLECTOMY AND ADENOIDECTOMY  1950's   TUBAL LIGATION     Family History  Problem Relation Age of Onset   Heart disease Mother    Stroke Brother    Stroke Maternal Uncle    COPD Paternal Uncle    Colon cancer Neg Hx    Stomach cancer Neg Hx    Esophageal cancer Neg Hx     Current Outpatient Medications:    albuterol  (PROVENTIL  HFA;VENTOLIN  HFA) 108 (90 BASE) MCG/ACT inhaler, Inhale 2 puffs into the lungs every 6 (six) hours as needed for wheezing or shortness of breath. , Disp: , Rfl:    amLODipine  (NORVASC ) 5 MG tablet, Take 5 mg by mouth every evening., Disp: , Rfl:    aspirin EC 81 MG tablet, Take 81 mg by mouth daily. Swallow whole., Disp: , Rfl:    atorvastatin  (LIPITOR) 20 MG tablet, Take 20 mg by mouth every evening. , Disp: , Rfl:    B Complex-Folic  Acid (B COMPLEX VITAMINS, W/ FA,) CAPS, Take 1 capsule by mouth daily., Disp: , Rfl:    cholecalciferol (VITAMIN D3) 25 MCG (1000 UNIT) tablet, Take 1,000 Units by mouth daily., Disp: , Rfl:    Fluticasone-Umeclidin-Vilant (TRELEGY ELLIPTA ) 100-62.5-25 MCG/ACT AEPB, INHALE 1 PUFF ONCE DAILY, Disp: 60 each, Rfl: 11   GEMTESA  75 MG TABS, Take 1 tablet by mouth daily., Disp: , Rfl:    ipratropium (ATROVENT) 0.06 % nasal spray, Place 2 sprays into both nostrils daily as needed for rhinitis., Disp: , Rfl:    levocetirizine (XYZAL) 5 MG tablet, , Disp: , Rfl:    linaclotide  (LINZESS ) 72 MCG capsule, Take 1 capsule (72 mcg total) by mouth daily before breakfast., Disp: 8 capsule, Rfl: 0   montelukast  (SINGULAIR ) 10 MG tablet, Take 10 mg by mouth every evening. , Disp: , Rfl:    polyethylene glycol (MIRALAX ) 17 g packet, Take 17 g by mouth 2 (two) times daily. Titrate as needed, Disp: , Rfl:    PREVIDENT  5000 BOOSTER PLUS  1.1 % PSTE, Place 1 application onto teeth daily., Disp: , Rfl:    tamoxifen  (NOLVADEX ) 20 MG tablet, Take 1 tablet (20 mg total) by mouth daily., Disp: 90 tablet, Rfl: 3   valACYclovir  (VALTREX ) 1000 MG tablet, Take 1 tablet (1,000 mg total) by mouth 2 (two) times daily., Disp: 60 tablet, Rfl: 2   venlafaxine  XR (EFFEXOR -XR) 37.5 MG 24 hr capsule, Take 1 capsule (37.5 mg total) by mouth daily with breakfast., Disp: 90 capsule, Rfl: 1 Allergies  Allergen Reactions   Chantix [Varenicline] Other (See Comments)    Makes her feel crazy   Codeine Itching    ROS: A complete ROS was performed with pertinent positives/negatives noted in the HPI. The remainder of the ROS are negative.   Objective:   Today's Vitals   07/11/23 0938  BP: 136/84  Pulse: 93  Temp: 98.1 F (36.7 C)  TempSrc: Temporal  SpO2: 97%  Weight: 149 lb (67.6 kg)  Height: 5' (1.524 m)    GENERAL: Well-appearing, in NAD. Well nourished.  SKIN: Pink, warm and dry.  NECK: Trachea midline. Full ROM w/o pain or tenderness. No lymphadenopathy.  RESPIRATORY: Chest wall symmetrical. Respirations even and non-labored. Breath sounds clear to auscultation bilaterally.  CARDIAC: S1, S2 present, regular rate and rhythm. Peripheral pulses 2+ bilaterally.  EXTREMITIES: Without clubbing, cyanosis.  1+ non-pitting edema RLE NEUROLOGIC:  Steady, even gait.  PSYCH/MENTAL STATUS: Alert, oriented x 3. Cooperative, appropriate mood and affect.   There are no preventive care reminders to display for this patient.   No results found for any visits on 07/11/23.  Assessment & Plan:  Assessment and Plan    Hypertension Well controlled on Amlodipine  5mg  daily. -Continue current medication.  COPD Stable on current regimen of Trelegy, Singulair , and Albuterol . Regular follow-ups with pulmonologist. -Continue current medications. - continue routine follow ups with pulmonology  Lung Cancer Screening Due for repeat screening in August  2025.  Gastroesophageal Reflux Disease (GERD) and Constipation -Continue current medications.  Breast Cancer In remission, currently on Tamoxifen . -Continue current medication. - continue routine follow ups with oncology  - UTD on mammogram (04/22/2023) , no acute findings   Hyperlipidemia On Atorvastatin  20mg  daily. -Continue current medication. - fasting lipid panel lab   Overactive Bladder -Continue Vibragon and Sanctura , manage refills as needed as her OB/GYN is closing her [place] practice.   Tobacco Use Current smoker, 1 pack per day for approximately 60 years. - Advised  to quit.   Herpes Labialis (Cold Sores) Recurrent outbreaks. -Prescribe Valacyclovir  for outbreaks  Elevated fasting glucose level Previous elevated blood glucose levels. -Order HbA1c.   Follow-up in 6 months or sooner if lab results indicate diabetes.       Orders Placed This Encounter  Procedures   Lipid Profile   Hemoglobin A1C   Meds ordered this encounter  Medications   valACYclovir  (VALTREX ) 1000 MG tablet    Sig: Take 1 tablet (1,000 mg total) by mouth 2 (two) times daily.    Dispense:  60 tablet    Refill:  2    Supervising Provider:   SEBASTIAN BEVERLEY NOVAK [[id number]]    Return in about 6 months (around 01/08/2024) for Chronic Condition follow up.   Rosina Senters, FNP

## 2023-07-12 DIAGNOSIS — M5416 Radiculopathy, lumbar region: Secondary | ICD-10-CM | POA: Diagnosis not present

## 2023-07-22 ENCOUNTER — Telehealth: Payer: Self-pay | Admitting: Internal Medicine

## 2023-07-22 NOTE — Telephone Encounter (Signed)
Copied from CRM [id number]. Topic: General - Inquiry >> Jul 22, 2023 10:00 AM [place] wrote: Reason for CRM: Patient was going on a tour bus for [employer] but she had to cancel due to the weather and it being so cold out. She is asking for a note/letter stating that they recommend that they not go, due to the cold and frigid weather. Please reach out to Patient [phone]  [email or web address]) as soon as possible to talk about this. The tour companys fax # [phone]  and the email is [email or web address] Sheralyn Boatman

## 2023-07-22 NOTE — Telephone Encounter (Signed)
Please advise 

## 2023-07-22 NOTE — Telephone Encounter (Signed)
Unfortunately I am unable to do this.

## 2023-07-31 DIAGNOSIS — M5416 Radiculopathy, lumbar region: Secondary | ICD-10-CM | POA: Diagnosis not present

## 2023-08-01 ENCOUNTER — Telehealth: Payer: Self-pay | Admitting: Pulmonary Disease

## 2023-08-01 NOTE — Telephone Encounter (Signed)
I called and spoke with patient.  [facility] has not sent her any mychart messages since 08/31/22.  Patient has no upcoming PFT ordered and LOV notes from Dr. Francine Graven does not mention any need for PFT before follow up.  Patient has scheduled follow up 08/21/23 with Dr. Francine Graven.  Nothing further at this time.

## 2023-08-01 NOTE — Telephone Encounter (Signed)
PT states she got a [employer] (I could not see one from [place]) stating she needed to have testing before the 13th of Feb. Her appt is the 19th. I see no activity tab orders, the AVS says nothing about testing (Like for a PFT test). She thinks it was a message about a PFT but is not sure. Please call PT to advise. Her # is [phone]  Earleen Reaper has been out since Monday.

## 2023-08-02 DIAGNOSIS — Z008 Encounter for other general examination: Secondary | ICD-10-CM | POA: Diagnosis not present

## 2023-08-03 ENCOUNTER — Ambulatory Visit
Admission: EM | Admit: 2023-08-03 | Discharge: 2023-08-03 | Disposition: A | Discharge status: Home / Self Care | Payer: Medicare Other | Attending: Family Medicine | Admitting: Family Medicine

## 2023-08-03 DIAGNOSIS — U071 COVID-19: Secondary | ICD-10-CM | POA: Diagnosis not present

## 2023-08-03 DIAGNOSIS — J4489 Other specified chronic obstructive pulmonary disease: Secondary | ICD-10-CM | POA: Diagnosis not present

## 2023-08-03 MED ORDER — PREDNISONE 50 MG PO TABS: 50.0000 mg | ORAL_TABLET | Freq: Every day | ORAL | 0 refills | Status: DC

## 2023-08-03 MED ORDER — PROMETHAZINE-DM 6.25-15 MG/5ML PO SYRP: 5.0000 mL | ORAL_SOLUTION | Freq: Three times a day (TID) | ORAL | 0 refills | Status: DC | PRN

## 2023-08-03 MED ORDER — NIRMATRELVIR&RITONAVIR 150/100 10 X 150 MG & 10 X 100MG PO TBPK: ORAL_TABLET | ORAL | 0 refills | Status: DC

## 2023-08-03 MED ORDER — ALBUTEROL SULFATE HFA 108 (90 BASE) MCG/ACT IN AERS: 2.0000 | INHALATION_SPRAY | Freq: Four times a day (QID) | RESPIRATORY_TRACT | 0 refills | Status: AC | PRN

## 2023-08-03 NOTE — ED Provider Notes (Signed)
[facility] - [facility]RGENT [facility]ARE C[facility]  Note:  This document was prepared using Conservation officer, historic buildings and may include unintentional dictation errors.  MRN: [id number] DOB: 1946-04-04  Subjective:   [name] is a 78 y.o. female presenting for 3-day history of persistent coughing, fatigue, low-grade fevers, wheezing.  Has COPD, chronic bronchitis.  Needs a refill on her albuterol inhaler.  Patient did a COVID test at home and was positive.  No current facility-administered medications for this encounter.  Current Outpatient Medications:    albuterol (PROVENTIL HFA;VENTOLIN HFA) 108 (90 BASE) MCG/ACT inhaler, Inhale 2 puffs into the lungs every 6 (six) hours as needed for wheezing or shortness of breath. , Disp: , Rfl:    amLODipine (NORVASC) 5 MG tablet, Take 5 mg by mouth every evening., Disp: , Rfl:    aspirin EC 81 MG tablet, Take 81 mg by mouth daily. Swallow whole., Disp: , Rfl:    atorvastatin (LIPITOR) 20 MG tablet, Take 20 mg by mouth every evening. , Disp: , Rfl:    B Complex-Folic Acid (B COMPLEX VITAMINS, W/ FA,) CAPS, Take 1 capsule by mouth daily., Disp: , Rfl:    cholecalciferol (VITAMIN D3) 25 MCG (1000 UNIT) tablet, Take 1,000 Units by mouth daily., Disp: , Rfl:    Fluticasone-Umeclidin-Vilant (TRELEGY ELLIPTA) 100-62.5-25 MCG/ACT AEPB, INHALE 1 PUFF ONCE DAILY, Disp: 60 each, Rfl: 11   levocetirizine (XYZAL) 5 MG tablet, , Disp: , Rfl:    linaclotide (LINZESS) 72 MCG capsule, Take 1 capsule (72 mcg total) by mouth daily before breakfast., Disp: 8 capsule, Rfl: 0   montelukast (SINGULAIR) 10 MG tablet, Take 10 mg by mouth every evening. , Disp: , Rfl:    venlafaxine XR (EFFEXOR-XR) 37.5 MG 24 hr capsule, Take 1 capsule (37.5 mg total) by mouth daily with breakfast., Disp: 90 capsule, Rfl: 1   GEMTESA 75 MG TABS, Take 1 tablet by mouth daily., Disp: , Rfl:    ipratropium (ATROVENT) 0.06 % nasal spray, Place 2 sprays into both nostrils daily as needed for  rhinitis., Disp: , Rfl:    polyethylene glycol (MIRALAX) 17 g packet, Take 17 g by mouth 2 (two) times daily. Titrate as needed, Disp: , Rfl:    PREVIDENT 5000 BOOSTER PLUS 1.1 % PSTE, Place 1 application onto teeth daily., Disp: , Rfl:    tamoxifen (NOLVADEX) 20 MG tablet, Take 1 tablet (20 mg total) by mouth daily., Disp: 90 tablet, Rfl: 3   valACYclovir (VALTREX) 1000 MG tablet, Take 1 tablet (1,000 mg total) by mouth 2 (two) times daily., Disp: 60 tablet, Rfl: 2   Allergies  Allergen Reactions   Chantix [Varenicline] Other (See Comments)    Makes her feel crazy   Codeine Itching    Past Medical History:  Diagnosis Date   Allergy    Asthma    Atypical chest pain 05/29/2011   Normal cardiology eval: ECHO and stress testing 2012.  Cardiac cath: normal 2014 for worsening dyspnea     Bimalleolar fracture of right ankle 03/20/2017   Breast cancer (HCC)    Bulging lumbar disc    L4 OR L5 TAKES STEROID INJECTIONS FOR TOOK INJECTION MARCH 2019   Chronic bronchitis (HCC) FEW YEARS AGO   Chronic hepatitis C (HCC)    TOOK HARVONI TX 2017    Colon polyp    COPD (chronic obstructive pulmonary disease) (HCC)    Diverticulitis 12YRS AGO   Emphysema of lung (HCC)    TOOK PULMONARY FUCTION TEST  AND PASSED FEW WEEKS AGO AT FAMILY  MD   Gallstones    GERD (gastroesophageal reflux disease)    History of kidney stones 20 YRS AGO    Hormone replacement therapy (postmenopausal) 08/22/2012   Hyperlipidemia    Hypertension    Malignant neoplasm of upper-outer quadrant of left breast in female, 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 Doreen Beam       Past Surgical History:  Procedure Laterality Date   ABDOMINAL HYSTERECTOMY  1980   PARTIAL   ANKLE ARTHROSCOPY Right 10/02/2017   Procedure:  RIGHT ANKLE ARTHROSCOPY WITH EXTENSIVE DEBRIDEMENT;  Surgeon: Jodi Geralds, MD;  Location: [facility];  Service: Orthopedics;  Laterality: Right;  90 MINUTES FOR CASE   BREAST BIOPSY  02/07/2010   BREAST LUMPECTOMY WITH RADIOACTIVE SEED LOCALIZATION Left 10/21/2020   Procedure: LEFT BREAST LUMPECTOMY WITH RADIOACTIVE SEED LOCALIZATION;  Surgeon: Abigail Miyamoto, MD;  Location: [facility] OR;  Service: General;  Laterality: Left;  60 MINUTES ROOM 2   BREAST SURGERY  2011   biopsy   CATARACT EXTRACTION     CHOLECYSTECTOMY  1999   LAPAROSCOPIC   EYE SURGERY     FRACTURE SURGERY     HARDWARE REMOVAL Right 10/02/2017   Procedure: HARDWARE REMOVAL;  Surgeon: Jodi Geralds, MD;  Location: [facility];  Service: Orthopedics;  Laterality: Right;   INJECTION OF SILICONE OIL Left 01/04/2020   Procedure: INJECTION OF SILICONE OIL;  Surgeon: Stephannie Li, MD;  Location: [facility] OR;  Service: Ophthalmology;  Laterality: Left;   MEMBRANE PEEL Left 01/04/2020   Procedure: MEMBRANE PEEL;  Surgeon: Stephannie Li, MD;  Location: [facility] OR;  Service: Ophthalmology;  Laterality: Left;   ORIF ANKLE FRACTURE Right 03/20/2017   Procedure: OPEN REDUCTION INTERNAL FIXATION (ORIF) ANKLE FRACTURE;  Surgeon: Jodi Geralds, MD;  Location: [facility];  Service: Orthopedics;  Laterality: Right;   PARS PLANA VITRECTOMY Left 01/04/2020   Procedure: PARS PLANA VITRECTOMY WITH 25 GAUGE;  Surgeon: Stephannie Li, MD;  Location: [facility] OR;  Service: Ophthalmology;  Laterality: Left;   PHOTOCOAGULATION WITH LASER Left 01/04/2020   Procedure: PHOTOCOAGULATION WITH LASER;  Surgeon: Stephannie Li, MD;  Location: [facility] OR;  Service: Ophthalmology;  Laterality: Left;   TONSILLECTOMY     TONSILLECTOMY AND ADENOIDECTOMY  1950's   TUBAL LIGATION      Family History  Problem Relation Age of Onset   Heart disease Mother    Stroke Brother    Stroke Maternal Uncle    COPD Paternal Uncle    Colon cancer Neg  Hx    Stomach cancer Neg Hx    Esophageal cancer Neg Hx     Social History   Tobacco Use   Smoking status: Every Day    Current packs/day: 1.00    Average packs/day: 1 pack/day for 63.0 years (63.0 ttl pk-yrs)    Types: Cigarettes    Start date: 07/20/1960   Smokeless tobacco: Never  Vaping Use   Vaping status: Never Used  Substance Use Topics   Alcohol use: Not Currently    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   Drug use: No    ROS   Objective:   Vitals: BP (!) 142/102 (BP Location: Left Arm)   Pulse 98   Temp 99.5  F (37.5 C) (Oral)   Resp 16   SpO2 95%   Physical Exam Constitutional:      General: She is not in acute distress.    Appearance: Normal appearance. She is well-developed. She is not ill-appearing, toxic-appearing or diaphoretic.  HENT:     Head: Normocephalic and atraumatic.     Nose: Nose normal.     Mouth/Throat:     Mouth: Mucous membranes are moist.  Eyes:     General: No scleral icterus.       Right eye: No discharge.        Left eye: No discharge.     Extraocular Movements: Extraocular movements intact.  Cardiovascular:     Rate and Rhythm: Normal rate and regular rhythm.     Heart sounds: Normal heart sounds. No murmur heard.    No friction rub. No gallop.  Pulmonary:     Effort: Pulmonary effort is normal. No respiratory distress.     Breath sounds: No stridor. Wheezing and rhonchi present. No rales.  Chest:     Chest wall: No tenderness.  Skin:    General: Skin is warm and dry.  Neurological:     General: No focal deficit present.     Mental Status: She is alert and oriented to person, place, and time.  Psychiatric:        Mood and Affect: Mood normal.        Behavior: Behavior normal.     Assessment and Plan :   PDMP not reviewed this encounter.  1. Clinical diagnosis of COVID-19   2. COPD (chronic obstructive pulmonary disease) with chronic bronchitis (HCC)    GFR from December was less than 43 mL/min.   Recommend Paxlovid at renal dosing.  Also recommend prednisone for COPD, wheezing, rhonchi.  Albuterol refilled.  Hold atorvastatin.  Counseled patient on potential for adverse effects with medications prescribed/recommended today, ER and return-to-clinic precautions discussed, patient verbalized understanding.    Wallis Bamberg, [place] 08/03/23 [phone]

## 2023-08-03 NOTE — Discharge Instructions (Addendum)
Hold atorvastatin (your cholesterol medication) for the next 10 days. Start Paxlovid for your COVID 19 infection, start prednisone for your COPD. Please take all other medications as prescribed.

## 2023-08-03 NOTE — ED Triage Notes (Signed)
Pt reports cough,fatigue and low grade temp x 3 days. +home covid test.

## 2023-08-06 ENCOUNTER — Ambulatory Visit
Admission: RE | Admit: 2023-08-06 | Discharge: 2023-08-06 | Disposition: A | Discharge status: Home / Self Care | Payer: Medicare Other | Source: Ambulatory Visit | Attending: Physician Assistant | Admitting: Physician Assistant

## 2023-08-06 DIAGNOSIS — E2839 Other primary ovarian failure: Secondary | ICD-10-CM

## 2023-08-11 ENCOUNTER — Emergency Department (HOSPITAL_BASED_OUTPATIENT_CLINIC_OR_DEPARTMENT_OTHER): Payer: Medicare Other

## 2023-08-11 ENCOUNTER — Emergency Department (HOSPITAL_BASED_OUTPATIENT_CLINIC_OR_DEPARTMENT_OTHER)
Admission: EM | Admit: 2023-08-11 | Discharge: 2023-08-11 | Disposition: A | Discharge status: Home / Self Care | Payer: Medicare Other | Attending: Emergency Medicine | Admitting: Emergency Medicine

## 2023-08-11 ENCOUNTER — Other Ambulatory Visit: Payer: Self-pay

## 2023-08-11 ENCOUNTER — Encounter (HOSPITAL_BASED_OUTPATIENT_CLINIC_OR_DEPARTMENT_OTHER): Payer: Self-pay

## 2023-08-11 DIAGNOSIS — Z7901 Long term (current) use of anticoagulants: Secondary | ICD-10-CM | POA: Insufficient documentation

## 2023-08-11 DIAGNOSIS — I2699 Other pulmonary embolism without acute cor pulmonale: Secondary | ICD-10-CM | POA: Diagnosis not present

## 2023-08-11 DIAGNOSIS — M7918 Myalgia, other site: Secondary | ICD-10-CM | POA: Diagnosis present

## 2023-08-11 LAB — CBC WITH DIFFERENTIAL/PLATELET
Abs Immature Granulocytes: 0.1 10*3/uL — ABNORMAL HIGH (ref 0.00–0.07)
Basophils Absolute: 0 10*3/uL (ref 0.0–0.1)
Basophils Relative: 0 %
Eosinophils Absolute: 0 10*3/uL (ref 0.0–0.5)
Eosinophils Relative: 0 %
HCT: 41.7 % (ref 36.0–46.0)
Hemoglobin: 14.1 g/dL (ref 12.0–15.0)
Immature Granulocytes: 1 %
Lymphocytes Relative: 13 %
Lymphs Abs: 2.5 10*3/uL (ref 0.7–4.0)
MCH: 31.1 pg (ref 26.0–34.0)
MCHC: 33.8 g/dL (ref 30.0–36.0)
MCV: 91.9 fL (ref 80.0–100.0)
Monocytes Absolute: 1 10*3/uL (ref 0.1–1.0)
Monocytes Relative: 6 %
Neutro Abs: 14.7 10*3/uL — ABNORMAL HIGH (ref 1.7–7.7)
Neutrophils Relative %: 80 %
Platelets: 232 10*3/uL (ref 150–400)
RBC: 4.54 MIL/uL (ref 3.87–5.11)
RDW: 13.1 % (ref 11.5–15.5)
WBC: 18.4 10*3/uL — ABNORMAL HIGH (ref 4.0–10.5)
nRBC: 0 % (ref 0.0–0.2)

## 2023-08-11 LAB — BASIC METABOLIC PANEL
Anion gap: 8 (ref 5–15)
BUN: 24 mg/dL — ABNORMAL HIGH (ref 8–23)
CO2: 26 mmol/L (ref 22–32)
Calcium: 8.9 mg/dL (ref 8.9–10.3)
Chloride: 104 mmol/L (ref 98–111)
Creatinine, Ser: 1.22 mg/dL — ABNORMAL HIGH (ref 0.44–1.00)
GFR, Estimated: 45 mL/min — ABNORMAL LOW (ref >60)
Glucose, Bld: 110 mg/dL — ABNORMAL HIGH (ref 70–99)
Potassium: 4.3 mmol/L (ref 3.5–5.1)
Sodium: 138 mmol/L (ref 135–145)

## 2023-08-11 LAB — TROPONIN I (HIGH SENSITIVITY): Troponin I (High Sensitivity): 9 ng/L (ref <18)

## 2023-08-11 LAB — D-DIMER, QUANTITATIVE: D-Dimer, Quant: 3.65 ug{FEU}/mL — ABNORMAL HIGH (ref 0.00–0.50)

## 2023-08-11 MED ORDER — APIXABAN 2.5 MG PO TABS
10.0000 mg | ORAL_TABLET | Freq: Once | ORAL | Status: AC
  Administered 2023-08-11: 10 mg via ORAL
  Filled 2023-08-11: qty 4

## 2023-08-11 MED ORDER — ACETAMINOPHEN 500 MG PO TABS
1000.0000 mg | ORAL_TABLET | Freq: Once | ORAL | Status: AC
  Administered 2023-08-11: 1000 mg via ORAL
  Filled 2023-08-11: qty 2

## 2023-08-11 MED ORDER — APIXABAN (ELIQUIS) VTE STARTER PACK (10MG AND 5MG)
ORAL_TABLET | ORAL | 0 refills | Status: DC
  Filled 2023-08-11: qty 74, 30d supply, fill #0

## 2023-08-11 MED ORDER — LIDOCAINE 5 % EX PTCH
1.0000 | MEDICATED_PATCH | Freq: Once | CUTANEOUS | Status: DC
  Administered 2023-08-11: 1 via TRANSDERMAL
  Filled 2023-08-11: qty 1

## 2023-08-11 MED ORDER — OXYCODONE HCL 5 MG PO TABS
5.0000 mg | ORAL_TABLET | Freq: Four times a day (QID) | ORAL | 0 refills | Status: DC | PRN
  Filled 2023-08-11: qty 9, 3d supply, fill #0

## 2023-08-11 MED ORDER — LIDOCAINE 5 % EX PTCH
1.0000 | MEDICATED_PATCH | CUTANEOUS | 0 refills | Status: DC
  Filled 2023-08-11: qty 30, 30d supply, fill #0

## 2023-08-11 MED ORDER — OXYCODONE HCL 5 MG PO TABS
5.0000 mg | ORAL_TABLET | Freq: Once | ORAL | Status: AC
  Administered 2023-08-11: 5 mg via ORAL
  Filled 2023-08-11: qty 1

## 2023-08-11 MED ORDER — IOHEXOL 350 MG/ML SOLN
80.0000 mL | Freq: Once | INTRAVENOUS | Status: AC | PRN
  Administered 2023-08-11: 80 mL via INTRAVENOUS

## 2023-08-11 NOTE — ED Triage Notes (Signed)
 Pt reports that she was diagnosed with covid last week. States that her left side is in pain. States that she is in pain constantly. Reports coughing is worse, back is hurting.

## 2023-08-11 NOTE — Discharge Instructions (Signed)
 You were seen in the emergency department for your back pain.  Your workup did show that you have a blood clot, small clot in both your left and right lung.  There is no signs of any complications of this clot or that it is causing any stress on your heart.  I have given you prescription for blood thinner and you should take this as prescribed.  You can take Tylenol  every 6 hours as needed for pain and I have given you a few oxycodone  for breakthrough pain.  This can make you drowsy so do not take it while driving, working or operating heavy machinery.  You should follow-up with your primary doctor in the next few days to have your symptoms rechecked.  You should return to the emergency department for worsening shortness of breath, chest pain, if you pass out or any other new or concerning symptoms.

## 2023-08-11 NOTE — ED Provider Notes (Signed)
 [place] [facility] Provider Note   CSN: [id number] Arrival date & time: 08/11/23  0827     History  Chief Complaint  Patient presents with   Generalized Body Aches    [name] is a 78 y.o. female.  Patient is a 78 year old female with a past medical history of COPD, hypertension, GERD presenting to the emergency department with left-sided back pain.  The patient states that she was diagnosed with COVID about 1 week ago.  She states she was treated with Paxlovid  as well as steroids for bronchitis.  She states that her COVID symptoms have improved however she has started to develop pain on the left side of her upper back that radiates into her left axilla and left side of her chest.  She states that the pain is constant and is worse when she coughs or takes a deep breath.  She states that it is worse when she stands.  She states that she is still coughing up mucus.  She denies significant shortness of breath.  She states she is no longer having any fevers.  She denies any lower extremity swelling.  The history is provided by the patient.       Home Medications Prior to Admission medications   Medication Sig Start Date End Date Taking? Authorizing Provider  APIXABAN  (ELIQUIS ) VTE STARTER PACK (10MG  AND 5MG ) Take as directed on package: start with two-5mg  tablets twice daily for 7 days. On day 8, switch to one-5mg  tablet twice daily. 08/11/23  Yes Ellouise, Cristi Gwynn K, DO  lidocaine  (LIDODERM ) 5 % Place 1 patch onto the skin daily. Remove & Discard patch within 12 hours or as directed by MD 08/11/23  Yes Ellouise, Sarin [place] K, DO  oxyCODONE  (ROXICODONE ) 5 MG immediate release tablet Take 1 tablet (5 mg total) by mouth every 6 (six) hours as needed. 08/11/23  Yes Ellouise, Joylyn Duggin K, DO  albuterol  (VENTOLIN  HFA) 108 (90 Base) MCG/ACT inhaler Inhale 2 puffs into the lungs every 6 (six) hours as needed for wheezing or shortness of breath. 08/03/23   Christopher Savannah, PA-C  amLODipine  (NORVASC ) 5 MG tablet Take 5 mg by mouth every evening. 11/11/14   [provider]  aspirin EC 81 MG tablet Take 81 mg by mouth daily. Swallow whole.    [provider]  atorvastatin  (LIPITOR) 20 MG tablet Take 20 mg by mouth every evening.     [provider]  B Complex-Folic Acid (B COMPLEX VITAMINS, W/ FA,) CAPS Take 1 capsule by mouth daily. 05/19/21   [provider]  cholecalciferol (VITAMIN D3) 25 MCG (1000 UNIT) tablet Take 1,000 Units by mouth daily.    [provider]  Fluticasone-Umeclidin-Vilant (TRELEGY ELLIPTA ) 100-62.5-25 MCG/ACT AEPB INHALE 1 PUFF ONCE DAILY 04/05/23   Kara Dorn NOVAK, MD  GEMTESA  75 MG TABS Take 1 tablet by mouth daily.    [provider]  ipratropium (ATROVENT) 0.06 % nasal spray Place 2 sprays into both nostrils daily as needed for rhinitis.    [provider]  levocetirizine (XYZAL) 5 MG tablet  10/18/22   [provider]  linaclotide  (LINZESS ) 72 MCG capsule Take 1 capsule (72 mcg total) by mouth daily before breakfast. 05/20/23   Armbruster, Elspeth SQUIBB, MD  montelukast  (SINGULAIR ) 10 MG tablet Take 10 mg by mouth every evening.     [provider]  nirmatrelvir /ritonavir , renal dosing, (PAXLOVID ) 10 x 150 MG & 10 x 100MG  TBPK Take nirmatrelvir  (150mg )  one tablet twice daily for 5 days and ritonavir  (100 mg) one tablet twice daily for 5 days. 08/03/23   Christopher Savannah, PA-C  polyethylene glycol (MIRALAX ) 17 g packet Take 17 g by mouth 2 (two) times daily. Titrate as needed 05/20/23   Armbruster, Elspeth SQUIBB, MD  predniSONE  (DELTASONE ) 50 MG tablet Take 1 tablet (50 mg total) by mouth daily with breakfast. 08/03/23   Christopher Savannah, PA-C  PREVIDENT  5000 BOOSTER PLUS 1.1 % PSTE Place 1 application onto teeth daily. 06/29/20   [provider]  promethazine -dextromethorphan (PROMETHAZINE -DM) 6.25-15 MG/5ML syrup Take 5 mLs by mouth 3 (three) times daily as needed for  cough. 08/03/23   Christopher Savannah, PA-C  tamoxifen  (NOLVADEX ) 20 MG tablet Take 1 tablet (20 mg total) by mouth daily. 12/12/22   Burton, Lacie K, NP  valACYclovir  (VALTREX ) 1000 MG tablet Take 1 tablet (1,000 mg total) by mouth 2 (two) times daily. 07/11/23 10/09/23  Billy Knee, FNP  venlafaxine  XR (EFFEXOR -XR) 37.5 MG 24 hr capsule Take 1 capsule (37.5 mg total) by mouth daily with breakfast. 06/11/23   Burton, Lacie K, NP      Allergies    Chantix [varenicline] and Codeine    Review of Systems   Review of Systems  Physical Exam Updated Vital Signs BP 139/65   Pulse 94   Temp 98.2 F (36.8 C) (Oral)   Resp 20   Ht 5' (1.524 m)   Wt 65.8 kg   SpO2 100%   BMI 28.32 kg/m  Physical Exam Vitals and nursing note reviewed.  Constitutional:      General: She is not in acute distress.    Appearance: Normal appearance.  HENT:     Head: Normocephalic and atraumatic.     Nose: Nose normal.     Mouth/Throat:     Mouth: Mucous membranes are moist.     Pharynx: Oropharynx is clear.  Eyes:     Extraocular Movements: Extraocular movements intact.     Conjunctiva/sclera: Conjunctivae normal.  Cardiovascular:     Rate and Rhythm: Normal rate and regular rhythm.     Heart sounds: Normal heart sounds.  Pulmonary:     Effort: Pulmonary effort is normal.     Breath sounds: Wheezing (RLL trace end expiratory wheeze) present.  Abdominal:     General: Abdomen is flat.     Palpations: Abdomen is soft.     Tenderness: There is no abdominal tenderness.  Musculoskeletal:        General: Normal range of motion.     Cervical back: Normal range of motion.     Comments: No midline neck tenderness Diffuse L-sided thoracic muscle tenderness to palpation, L lateral chest wall tenderness to palpation without overlying skin changes; no point tenderness to ribs  Skin:    General: Skin is warm and dry.     Findings: No rash.  Neurological:     General: No focal deficit present.     Mental Status: She is  alert and oriented to person, place, and time.  Psychiatric:        Mood and Affect: Mood normal.        Behavior: Behavior normal.     ED Results / Procedures / Treatments   Labs (all labs ordered are listed, but only abnormal results are displayed) Labs Reviewed  CBC WITH DIFFERENTIAL/PLATELET - Abnormal; Notable for the following components:      Result Value   WBC 18.4 (*)    Neutro Abs  14.7 (*)    Abs Immature Granulocytes 0.10 (*)    All other components within normal limits  D-DIMER, QUANTITATIVE - Abnormal; Notable for the following components:   D-Dimer, Quant 3.65 (*)    All other components within normal limits  BASIC METABOLIC PANEL - Abnormal; Notable for the following components:   Glucose, Bld 110 (*)    BUN 24 (*)    Creatinine, Ser 1.22 (*)    GFR, Estimated 45 (*)    All other components within normal limits  TROPONIN I (HIGH SENSITIVITY)    EKG None  Radiology CT Angio Chest PE W/Cm &/Or Wo Cm Result Date: 08/11/2023 CLINICAL DATA:  Worsening cough and left-sided chest pain. Recently diagnosed with COVID. EXAM: CT ANGIOGRAPHY CHEST WITH CONTRAST TECHNIQUE: Multidetector CT imaging of the chest was performed using the standard protocol during bolus administration of intravenous contrast. Multiplanar CT image reconstructions and MIPs were obtained to evaluate the vascular anatomy. RADIATION DOSE REDUCTION: This exam was performed according to the departmental dose-optimization program which includes automated exposure control, adjustment of the mA and/or kV according to patient size and/or use of iterative reconstruction technique. CONTRAST:  80mL OMNIPAQUE  IOHEXOL  350 MG/ML SOLN COMPARISON:  Chest x-ray from same day. CT chest dated February 07, 2023. FINDINGS: Cardiovascular: Satisfactory opacification of the pulmonary arteries to the segmental level. Small subsegmental pulmonary emboli in the lingula and right lower lobe. Normal heart size. No pericardial effusion.  No thoracic aortic aneurysm or dissection. Coronary, aortic arch, and branch vessel atherosclerotic vascular disease. Mediastinum/Nodes: No enlarged mediastinal, hilar, or axillary lymph nodes. Thyroid  gland, trachea, and esophagus demonstrate no significant findings. Lungs/Pleura: Mild paraseptal and centrilobular emphysema again noted. Similar post radiation change in the anterior left upper lobe. No focal consolidation, pleural effusion, or pneumothorax. Upper Abdomen: No acute abnormality. Musculoskeletal: Postsurgical changes in the left breast. No acute or significant osseous findings. No acute or significant osseous findings. Review of the MIP images confirms the above findings. IMPRESSION: 1. Small subsegmental pulmonary emboli in the lingula and right lower lobe. 2. Aortic Atherosclerosis (ICD10-I70.0) and Emphysema (ICD10-J43.9). Electronically Signed   By: Elsie ONEIDA Shoulder M.D.   On: 08/11/2023 14:34   DG Chest 2 View Result Date: 08/11/2023 CLINICAL DATA:  COVID infection. EXAM: CHEST - 2 VIEW COMPARISON:  06/22/2013 FINDINGS: Right lung clear. Streaky density at the left base suggest atelectasis or scarring. Surgical clips are noted in the left breast. The cardiopericardial silhouette is within normal limits for size. No acute bony abnormality. IMPRESSION: Streaky density at the left base suggests atelectasis or scarring. Otherwise no acute cardiopulmonary findings. Electronically Signed   By: Camellia Candle M.D.   On: 08/11/2023 09:15    Procedures Procedures    Medications Ordered in ED Medications  lidocaine  (LIDODERM ) 5 % 1-3 patch (1 patch Transdermal Patch Applied 08/11/23 1314)  oxyCODONE  (Oxy IR/ROXICODONE ) immediate release tablet 5 mg (has no administration in time range)  apixaban  (ELIQUIS ) tablet 10 mg (has no administration in time range)  acetaminophen  (TYLENOL ) tablet 1,000 mg (1,000 mg Oral Given 08/11/23 1313)  iohexol  (OMNIPAQUE ) 350 MG/ML injection 80 mL (80 mLs Intravenous  Contrast Given 08/11/23 1404)    ED Course/ Medical Decision Making/ A&P Clinical Course as of 08/11/23 1502  Sun Aug 11, 2023  1342 D-dimer elevated, will need CTPE study. [VK]  1441 CTPE with small subsegmental PE on the RLL and lingula. She is otherwise low risk without signs of R heart strain and negative troponin. Symptoms ongoing  for several days so single troponin is sufficient. Patient stable for outpatient anticoagulation. Emphysematous changes on CT without focal consolidation making a pneumonia less likely. [VK]    Clinical Course User Index [VK] Kingsley, Natisha Trzcinski K, DO                                 Medical Decision Making This patient presents to the ED with chief complaint(s) of L-sided back pain with pertinent past medical history of recent COVID-19, COPD, HTN, GERD which further complicates the presenting complaint. The complaint involves an extensive differential diagnosis and also carries with it a high risk of complications and morbidity.    The differential diagnosis includes pneumonia, pneumothorax, pulmonary edema, pleural effusion, rib fracture less likely as no point tenderness, considering PE as she was tachycardic on arrival here with recent COVID infection, musculoskeletal pain  Additional history obtained: Additional history obtained from N/A Records reviewed urgent care records  ED Course and Reassessment: On patient's arrival she was initially mildly tachycardic otherwise hemodynamically stable in no acute distress.  She had chest x-ray performed in triage that showed a density in the left lower lung base concerning for atelectasis or scarring however and concern for possible pneumonia in the setting of her symptoms.  With her significant pain though also considering PE with her recent COVID diagnosis and will have labs including D-dimer performed.  She was given Tylenol  and lidocaine  patch and will be closely reassessed.  Independent labs interpretation:  The  following labs were independently interpreted: elevated d-dimer, leukocytosis  Independent visualization of imaging: - I independently visualized the following imaging with scope of interpretation limited to determining acute life threatening conditions related to emergency care: CXR, CTPE, which revealed subsegmental PE in RLL and lingula  Consultation: - Consulted or discussed management/test interpretation w/ external professional: N/a  Consideration for admission or further workup: Patient has no emergent conditions requiring admission or further work-up at this time and is stable for discharge home with primary care follow-up  Social Determinants of health: N/A    Amount and/or Complexity of Data Reviewed Labs: ordered. Radiology: ordered.  Risk OTC drugs. Prescription drug management.          Final Clinical Impression(s) / ED Diagnoses Final diagnoses:  PE (pulmonary thromboembolism) (HCC)    Rx / DC Orders ED Discharge Orders          Ordered    APIXABAN  (ELIQUIS ) VTE STARTER PACK (10MG  AND 5MG )       Note to Pharmacy: If starter pack unavailable, substitute with seventy-four 5 mg apixaban  tabs following the above SIG directions.   08/11/23 1500    oxyCODONE  (ROXICODONE ) 5 MG immediate release tablet  Every 6 hours PRN        08/11/23 1500    lidocaine  (LIDODERM ) 5 %  Every 24 hours        08/11/23 1500              Kingsley, Alem Fahl K, DO 08/11/23 1502

## 2023-08-12 ENCOUNTER — Other Ambulatory Visit (HOSPITAL_BASED_OUTPATIENT_CLINIC_OR_DEPARTMENT_OTHER): Payer: Self-pay

## 2023-08-12 ENCOUNTER — Telehealth: Payer: Self-pay | Admitting: Internal Medicine

## 2023-08-12 NOTE — Telephone Encounter (Signed)
 Copied from CRM [id number]. Topic: Clinical - Medication Question >> Aug 12, 2023 12:43 PM Kita Perish H wrote: Reason for CRM: Patient was diagnosed with pneumonia and blood clots in both lungs and was put on Eloquist also takes a low dose aspirin, and wants to know if she should discontinue or continue taking the aspirin along with the Eloquist.  [place] [phone]

## 2023-08-13 ENCOUNTER — Ambulatory Visit (INDEPENDENT_AMBULATORY_CARE_PROVIDER_SITE_OTHER): Payer: Medicare Other | Admitting: Internal Medicine

## 2023-08-13 ENCOUNTER — Encounter: Payer: Self-pay | Admitting: Internal Medicine

## 2023-08-13 VITALS — BP 128/74 | HR 93 | Temp 98.1°F | Ht 60.0 in | Wt 145.6 lb

## 2023-08-13 DIAGNOSIS — R251 Tremor, unspecified: Secondary | ICD-10-CM

## 2023-08-13 DIAGNOSIS — I2699 Other pulmonary embolism without acute cor pulmonale: Secondary | ICD-10-CM | POA: Diagnosis not present

## 2023-08-13 DIAGNOSIS — J40 Bronchitis, not specified as acute or chronic: Secondary | ICD-10-CM

## 2023-08-13 DIAGNOSIS — J449 Chronic obstructive pulmonary disease, unspecified: Secondary | ICD-10-CM

## 2023-08-13 DIAGNOSIS — U071 COVID-19: Secondary | ICD-10-CM

## 2023-08-13 MED ORDER — PROMETHAZINE-DM 6.25-15 MG/5ML PO SYRP: 5.0000 mL | ORAL_SOLUTION | Freq: Three times a day (TID) | ORAL | 0 refills | Status: DC | PRN

## 2023-08-13 NOTE — Telephone Encounter (Signed)
Patient has appt with PCP this morning

## 2023-08-13 NOTE — Progress Notes (Unsigned)
[facility] [address] [place] [address] Dept: [phone] Dept Fax: [phone]  Acute Care Office Visit  Subjective:   [name] 12-09-1945 08/13/2023  Chief Complaint  Patient presents with   Hospitalization Follow-up    HPI: Discussed the use of AI scribe software for clinical note transcription with the patient, who gave verbal consent to proceed.  History of Present Illness   The patient presents for ER follow-up visit. Patient was seen in ER on 08/11/23 for pulmonary emboli. She had recently been seen on 08/03/23 in ER, diagnosed with COVID-19, which was treated with Paxlovid,  and steroids for COPD exascerbation. Patient was treated with Eliquis for PE. She has been compliant with therapy. She denies any current CP, SHOB, wheezing. She does report pain in her left upper back, which she was given lidocaine patches for MSK pain, these do provide relief per patient.  The patient denies fever but reports lingering coughing and sneezing.   The patient also reports a new symptom of head and hand tremors, primarily in the mornings or when reaching for objects. The tremors are intermittent and not associated with any other neurological symptoms.       The following portions of the patient's history were reviewed and updated as appropriate: past medical history, past surgical history, family history, social history, allergies, medications, and problem list.   Patient Active Problem List   Diagnosis Date Noted   Mixed hyperlipidemia 07/11/2023   Tobacco use 07/11/2023   Herpes simplex type 1 infection 07/11/2023   History of breast cancer 07/11/2023   Prediabetes 03/10/2021   Overactive bladder 11/02/2019   Aortic atherosclerosis (HCC) 03/24/2019   Localized osteoarthritis of right ankle 10/02/2017   Retained orthopedic hardware 10/02/2017   Urge incontinence of urine 07/24/2016   Fibrosis of liver 02/01/2015    Spondylosis of cervical region without myelopathy or radiculopathy 11/02/2014   Depression with anxiety 12/25/2011   HTN (hypertension) 12/25/2011   GERD (gastroesophageal reflux disease) 12/25/2011   COPD, moderate (HCC) 12/25/2011   Chronic constipation 12/25/2011   Seasonal allergic rhinitis 10/02/2011   Osteoarthritis, knee 11/16/2010   Past Medical History:  Diagnosis Date   Allergy    Asthma    Atypical chest pain 05/29/2011   Normal cardiology eval: ECHO and stress testing 2012.  Cardiac cath: normal 2014 for worsening dyspnea     Bimalleolar fracture of right ankle 03/20/2017   Breast cancer (HCC)    Bulging lumbar disc    L4 OR L5 TAKES STEROID INJECTIONS FOR TOOK INJECTION MARCH 2019   Chronic bronchitis (HCC) FEW YEARS AGO   Chronic hepatitis C (HCC)    TOOK HARVONI TX 2017    Colon polyp    COPD (chronic obstructive pulmonary disease) (HCC)    Diverticulitis 42YRS AGO   Emphysema of lung (HCC)    TOOK PULMONARY FUCTION TEST AND PASSED FEW WEEKS AGO AT FAMILY  MD   Gallstones    GERD (gastroesophageal reflux disease)    History of kidney stones 20 YRS AGO    Hormone replacement therapy (postmenopausal) 08/22/2012   Hyperlipidemia    Hypertension    Malignant neoplasm of upper-outer quadrant of left breast in female, estrogen receptor positive (HCC) 09/30/2020   Osteoarthritis    Pneumonia AT BIRTH   Spondylosis of cervical region without myelopathy or radiculopathy    Squamous cell carcinoma of skin 06/15/2013   Arms and leg; [facility], Dr. Doreen Beam  Past Surgical History:  Procedure Laterality Date   ABDOMINAL HYSTERECTOMY  1980   PARTIAL   ANKLE ARTHROSCOPY Right 10/02/2017   Procedure: RIGHT ANKLE ARTHROSCOPY WITH EXTENSIVE DEBRIDEMENT;  Surgeon: Jodi Geralds, MD;  Location: [facility];  Service: Orthopedics;  Laterality: Right;  90 MINUTES FOR CASE   BREAST BIOPSY  02/07/2010   BREAST LUMPECTOMY WITH RADIOACTIVE SEED  LOCALIZATION Left 10/21/2020   Procedure: LEFT BREAST LUMPECTOMY WITH RADIOACTIVE SEED LOCALIZATION;  Surgeon: Abigail Miyamoto, MD;  Location: [facility] OR;  Service: General;  Laterality: Left;  60 MINUTES ROOM 2   BREAST SURGERY  2011   biopsy   CATARACT EXTRACTION     CHOLECYSTECTOMY  1999   LAPAROSCOPIC   EYE SURGERY     FRACTURE SURGERY     HARDWARE REMOVAL Right 10/02/2017   Procedure: HARDWARE REMOVAL;  Surgeon: Jodi Geralds, MD;  Location: [facility];  Service: Orthopedics;  Laterality: Right;   INJECTION OF SILICONE OIL Left 01/04/2020   Procedure: INJECTION OF SILICONE OIL;  Surgeon: Stephannie Li, MD;  Location: [facility] OR;  Service: Ophthalmology;  Laterality: Left;   MEMBRANE PEEL Left 01/04/2020   Procedure: MEMBRANE PEEL;  Surgeon: Stephannie Li, MD;  Location: [facility] OR;  Service: Ophthalmology;  Laterality: Left;   ORIF ANKLE FRACTURE Right 03/20/2017   Procedure: OPEN REDUCTION INTERNAL FIXATION (ORIF) ANKLE FRACTURE;  Surgeon: Jodi Geralds, MD;  Location: [facility];  Service: Orthopedics;  Laterality: Right;   PARS PLANA VITRECTOMY Left 01/04/2020   Procedure: PARS PLANA VITRECTOMY WITH 25 GAUGE;  Surgeon: Stephannie Li, MD;  Location: [facility] OR;  Service: Ophthalmology;  Laterality: Left;   PHOTOCOAGULATION WITH LASER Left 01/04/2020   Procedure: PHOTOCOAGULATION WITH LASER;  Surgeon: Stephannie Li, MD;  Location: [facility] OR;  Service: Ophthalmology;  Laterality: Left;   TONSILLECTOMY     TONSILLECTOMY AND ADENOIDECTOMY  1950's   TUBAL LIGATION     Family History  Problem Relation Age of Onset   Heart disease Mother    Stroke Brother    Stroke Maternal Uncle    COPD Paternal Uncle    Colon cancer Neg Hx    Stomach cancer Neg Hx    Esophageal cancer Neg Hx     Current Outpatient Medications:    albuterol (VENTOLIN HFA) 108 (90 Base) MCG/ACT inhaler, Inhale 2 puffs into the lungs every 6 (six) hours as needed for wheezing or shortness of breath.,  Disp: 18 g, Rfl: 0   amLODipine (NORVASC) 5 MG tablet, Take 5 mg by mouth every evening., Disp: , Rfl:    APIXABAN (ELIQUIS) VTE STARTER PACK (10MG  AND 5MG ), Take as directed on package: start with two-5mg  tablets twice daily for 7 days. On day 8, switch to one-5mg  tablet twice daily., Disp: 74 each, Rfl: 0   atorvastatin (LIPITOR) 20 MG tablet, Take 20 mg by mouth every evening. , Disp: , Rfl:    B Complex-Folic Acid (B COMPLEX VITAMINS, W/ FA,) CAPS, Take 1 capsule by mouth daily., Disp: , Rfl:    cholecalciferol (VITAMIN D3) 25 MCG (1000 UNIT) tablet, Take 1,000 Units by mouth daily., Disp: , Rfl:    Fluticasone-Umeclidin-Vilant (TRELEGY ELLIPTA) 100-62.5-25 MCG/ACT AEPB, INHALE 1 PUFF ONCE DAILY, Disp: 60 each, Rfl: 11   GEMTESA 75 MG TABS, Take 1 tablet by mouth daily., Disp: , Rfl:    ipratropium (ATROVENT) 0.06 % nasal spray, Place 2 sprays into both nostrils daily as needed for rhinitis., Disp: , Rfl:  levocetirizine (XYZAL) 5 MG tablet, , Disp: , Rfl:    lidocaine (LIDODERM) 5 %, Place 1 patch onto the skin daily. Remove & Discard patch within 12 hours or as directed by MD., Disp: 30 patch, Rfl: 0   montelukast (SINGULAIR) 10 MG tablet, Take 10 mg by mouth every evening. , Disp: , Rfl:    nirmatrelvir/ritonavir, renal dosing, (PAXLOVID) 10 x 150 MG & 10 x 100MG  TBPK, Take nirmatrelvir (150mg ) one tablet twice daily for 5 days and ritonavir (100 mg) one tablet twice daily for 5 days., Disp: 20 tablet, Rfl: 0   oxyCODONE (ROXICODONE) 5 MG immediate release tablet, Take 1 tablet (5 mg total) by mouth every 6 (six) hours as needed., Disp: 9 tablet, Rfl: 0   tamoxifen (NOLVADEX) 20 MG tablet, Take 1 tablet (20 mg total) by mouth daily., Disp: 90 tablet, Rfl: 3   valACYclovir (VALTREX) 1000 MG tablet, Take 1 tablet (1,000 mg total) by mouth 2 (two) times daily., Disp: 60 tablet, Rfl: 2   venlafaxine XR (EFFEXOR-XR) 37.5 MG 24 hr capsule, Take 1 capsule (37.5 mg total) by mouth daily with  breakfast., Disp: 90 capsule, Rfl: 1   aspirin EC 81 MG tablet, Take 81 mg by mouth daily. Swallow whole. (Patient not taking: Reported on 08/13/2023), Disp: , Rfl:    linaclotide (LINZESS) 72 MCG capsule, Take 1 capsule (72 mcg total) by mouth daily before breakfast. (Patient not taking: Reported on 08/13/2023), Disp: 8 capsule, Rfl: 0   polyethylene glycol (MIRALAX) 17 g packet, Take 17 g by mouth 2 (two) times daily. Titrate as needed (Patient not taking: Reported on 08/13/2023), Disp: , Rfl:    PREVIDENT 5000 BOOSTER PLUS 1.1 % PSTE, Place 1 application onto teeth daily., Disp: , Rfl:    promethazine-dextromethorphan (PROMETHAZINE-DM) 6.25-15 MG/5ML syrup, Take 5 mLs by mouth 3 (three) times daily as needed for cough., Disp: 200 mL, Rfl: 0 Allergies  Allergen Reactions   Chantix [Varenicline] Other (See Comments)    Makes her feel crazy   Codeine Itching     ROS: A complete ROS was performed with pertinent positives/negatives noted in the HPI. The remainder of the ROS are negative.    Objective:   Today's Vitals   08/13/23 0952 08/13/23 1030  BP: 128/74   Pulse: (!) 103 93  Temp: 98.1 F (36.7 C)   TempSrc: Temporal   SpO2: 98% 98%  Weight: 145 lb 9.6 oz (66 kg)   Height: 5' (1.524 m)     GENERAL: Well-appearing, in NAD. Well nourished.  SKIN: Pink, warm and dry. No rash, lesion, ulceration, or ecchymoses.  NECK: Trachea midline. Full ROM w/o pain or tenderness. No lymphadenopathy.  RESPIRATORY: Chest wall symmetrical. Respirations even and non-labored. Breath sounds clear to auscultation bilaterally.  CARDIAC: S1, S2 present, regular rate and rhythm. Peripheral pulses 2+ bilaterally.  EXTREMITIES: Without clubbing, cyanosis, or edema.  NEUROLOGIC: fine tremor movement to head and R. Hand. 5/5 grip strength. No sensory deficits.  Steady, even gait.  PSYCH/MENTAL STATUS: Alert, oriented x 3. Cooperative, appropriate mood and affect.    No results found for any visits on  08/13/23.    Assessment & Plan:  Assessment and Plan    Pulmonary Embolism Small subsegmental PE in right lung and lingula, likely secondary to recent COVID-19 infection. Currently on Eliquis 10mg  twice daily for 7 days, then 5mg  daily. Pain in the left upper back and chest, radiating to the armpit, exacerbated by coughing. No fever. -Continue Eliquis as  prescribed. -Return in one month for follow-up. -If symptoms worsen (increased shortness of breath, chest pain), seek immediate medical attention.  COPD Exacerbated by recent COVID-19 infection. Coughing and congestion present, difficulty expelling cough. -Continue daily and rescue inhalers  - Phenergan DM QID PRN cough  Unexplained Tremors Noted in the head, neck, and hands, primarily in the mornings or when reaching for objects. No history of falls, head injuries, vision changes, or difficulty walking. -Refer to neurologist for further evaluation   General Health Maintenance -Continue to monitor for signs of worsening symptoms related to pulmonary embolism or COPD. -If Eliquis runs out before next appointment, contact office for refill. -Follow-up with pulmonologist as scheduled on 08/21/2023.     Meds ordered this encounter  Medications   promethazine-dextromethorphan (PROMETHAZINE-DM) 6.25-15 MG/5ML syrup    Sig: Take 5 mLs by mouth 3 (three) times daily as needed for cough.    Dispense:  200 mL    Refill:  0    Supervising Provider:   Garnette Gunner [[id number]]   Orders Placed This Encounter  Procedures   Ambulatory referral to Neurology    Referral Priority:   Routine    Referral Type:   Consultation    Referral Reason:   Specialty Services Required    Requested Specialty:   Neurology    Number of Visits Requested:   1   Lab Orders  No laboratory test(s) ordered today   No images are attached to the encounter or orders placed in the encounter.  Return in about 1 month (around 09/10/2023) for blood clot.   Salvatore Decent, FNP

## 2023-08-21 ENCOUNTER — Ambulatory Visit: Payer: Medicare Other | Admitting: Pulmonary Disease

## 2023-08-22 ENCOUNTER — Encounter: Payer: Self-pay | Admitting: Neurology

## 2023-08-26 ENCOUNTER — Telehealth: Payer: Self-pay

## 2023-08-26 NOTE — Telephone Encounter (Signed)
 Pt called stating she was recently seen in the ED and was dx w/a PE.  Pt stated she is currently taking Tamoxifen and has found that it can cause blood clots.  Pt also stated she just recently also been dx with Covid and Pneumonia.  Pt stated she was started on Eliquis and wanted to know if she should continue to take Tamoxifen since she's on Eliquis and has a PE.  Pt stated that she thinks the PE was caused by pt taking Tamoxifen.  Informed pt that Covid can also cause PE which the pt was recently dx with having.  Stated that Tamoxifen can be taken with Eliquis but this nurse will make Dr. Mosetta Putt and her Team aware of the pt's new developments and will f/u with pt regarding their response.  Pt verbalized understanding and had no further questions or concerns at this time.

## 2023-08-27 ENCOUNTER — Telehealth: Payer: Self-pay

## 2023-08-27 ENCOUNTER — Telehealth: Payer: Self-pay | Admitting: Hematology

## 2023-08-27 ENCOUNTER — Other Ambulatory Visit: Payer: Self-pay

## 2023-08-27 NOTE — Telephone Encounter (Signed)
 Patient was returning voicemail left on 08/27/2023, patient is aware of scheduled appointment times/dates, patient has callback number for scheduling in case for any appointment changes

## 2023-08-27 NOTE — Telephone Encounter (Signed)
 LVM for pt regarding telephone conversation on 08/26/2023 regarding pt's PE and Tamoxifen.  Informed pt that Dr. Mosetta Putt would like for the pt to stop taking the Tamoxifen for now and she will discuss other tx options when the pt returns to clinic in 4 to 6 wks.  Stated that Dr. Latanya Maudlin scheduler will be contacting the pt to get the pt scheduled for lab and OV with Dr. Mosetta Putt and her team.  Instructed pt to contact Dr. Latanya Maudlin office should she have additional questions or concerns.

## 2023-08-27 NOTE — Telephone Encounter (Signed)
 Left patient a voicemail in regards to scheduled appointment per provider request on 08/27/2023, left patient callback number to schedule if patient wishes

## 2023-08-29 NOTE — Progress Notes (Signed)
 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  That being said, technically one has to have tremor for 3 years to have this diagnosis.  -I do think that her medications for her long likely contributed to the fact that this is waxing and waning.  We discussed this in detail.  -Patient's symptoms are really mild, and I do not recommend medication for this, especially since oral medications do not help head tremor.  More importantly, she is already bradycardic and therefore cannot be on a beta-blocker.  In addition, she was just placed on apixaban and therefore cannot be on primidone.  This eliminates the first-line medications for tremor.  If tremor gets worse, we can reassess this.  She did not disagree.  -She will follow-up as needed.  COPD with continued tobacco use  -Discussed importance of cessation. Subjective:   [name] was seen today in the movement disorders clinic for neurologic consultation at the request of Salvatore Decent, FNP.  The consultation is for the evaluation of head and hand tremors.  Separately from the above, the patient was just in the emergency February 9.  She had been diagnosed with COVID on February 1 and still has been coughing and had back pain.  On the second visit to the emergency room she was diagnosed with a small PE and started on Eliquis.  Tremor: Yes.     How long has it been going on? 6+ months  When is it noted the most?  Doesn't bother with writing/eating.  Mostly with rest in head/hands  Fam hx of tremor?  No.  Located where?  hands and head - may have started in hands first  Affected by caffeine:  unknown (drink 2 pepsi's per day)  Affected by alcohol:  unknown  Affected by stress:  No.  Affected by fatigue:  No.  Spills soup if on spoon:  No.  Spills glass of liquid if full:  No.  Affects ADL's (tying shoes, brushing teeth, etc):   No.  Tremor inducing meds:  albuterol (uses it 3 times per week and doesn't know if it makes her shake more); Singulair (been on this 8+ years); Trelegy  Other Specific Symptoms:  Voice: no change Postural symptoms:  Yes.  , and wonders if due to old ankle fx (but still is able to run, bike and play pickleball and walk but she has trouble standing on one leg with putting on pants)  Falls?  Last fall was off of a bike 2 years ago Bradykinesia symptoms: pushes off to arise Loss of smell:  had covid last month so decreased since then Urinary Incontinence:  on medication for this Difficulty Swallowing:  No. N/V:  No.  Neuroimaging of the brain has not previously been performed.   ALLERGIES:   Allergies  Allergen Reactions   Chantix [Varenicline] Other (See Comments)    Makes her feel crazy   Codeine Itching    CURRENT MEDICATIONS:  Current Outpatient Medications  Medication Instructions   albuterol (VENTOLIN HFA) 108 (90 Base) MCG/ACT inhaler 2 puffs, Inhalation, Every 6 hours PRN   amLODipine (NORVASC) 5 mg, Every evening   APIXABAN (ELIQUIS) VTE STARTER PACK (10MG  AND 5MG ) Take as directed on package: start with two-5mg  tablets twice daily for 7 days. On day 8, switch to one-5mg  tablet twice daily.   aspirin EC 81 mg, Daily   atorvastatin (LIPITOR) 20  mg, Every evening   B Complex-Folic Acid (B COMPLEX VITAMINS, W/ FA,) CAPS 1 capsule, Daily   cholecalciferol (VITAMIN D3) 1,000 Units, Daily   Fluticasone-Umeclidin-Vilant (TRELEGY ELLIPTA) 100-62.5-25 MCG/ACT AEPB INHALE 1 PUFF ONCE DAILY   GEMTESA 75 MG TABS 1 tablet, Daily   ipratropium (ATROVENT) 0.06 % nasal spray 2 sprays, Daily PRN   levocetirizine (XYZAL) 5 MG tablet    linaclotide (LINZESS) 72 mcg, Oral, Daily before breakfast   montelukast (SINGULAIR) 10 mg, Every evening   PREVIDENT 5000 BOOSTER PLUS 1.1 % PSTE 1 application , Daily   valACYclovir (VALTREX) 1,000 mg, Oral, 2 times daily   venlafaxine XR (EFFEXOR-XR)  37.5 mg, Oral, Daily with breakfast    Objective:   PHYSICAL EXAMINATION:    VITALS:   Vitals:   09/02/23 1027  BP: 130/70  Pulse: (!) 50  SpO2: 96%  Weight: 148 lb (67.1 kg)  Height: 5' (1.524 m)    GEN:  The patient appears stated age and is in NAD. HEENT:  Normocephalic, atraumatic.  The mucous membranes are moist. The superficial temporal arteries are without ropiness or tenderness. CV:  brady.  regular Lungs:  CTAB Neck/HEME:  There are no carotid bruits bilaterally.  Neurological examination:  Orientation: The patient is alert and oriented x3.  Cranial nerves: There is good facial symmetry.  Extraocular muscles are intact. The visual fields are full to confrontational testing. The speech is fluent and clear. Soft palate rises symmetrically and there is no tongue deviation. Hearing is intact to conversational tone. Sensation: Sensation is intact to light touch throughout (facial, trunk, extremities). Vibration is intact at the bilateral big toe. There is no extinction with double simultaneous stimulation.  Motor: Strength is 5/5 in the bilateral upper and lower extremities.   Shoulder shrug is equal and symmetric.  There is no pronator drift. Deep tendon reflexes: Deep tendon reflexes are 2/4 at the bilateral biceps, triceps, brachioradialis, patella and achilles. Plantar responses are downgoing bilaterally.  Movement examination: Tone: There is nl tone in the bilateral upper extremities.  The tone in the lower extremities is nl.  Abnormal movements: there is no rest tremor.  There is no postural tremor.  There is min intention tremor.  she has no difficulty with archimedes spirals.   She has min tremor when pouring water from one glass to another but doesn't spill it.  She has a bit of tremor when she goes to grab a paper towel after she pours the water.  She has rare head tremor in the "yes" direction     Coordination:  There is no decremation with RAM's, with any form of  RAMS, including alternating supination and pronation of the forearm, hand opening and closing, finger taps, heel taps and toe taps.  Gait and Station: The patient has no difficulty arising out of a deep-seated chair without the use of the hands. The patient's stride length is good.   I have reviewed and interpreted the following labs independently   Chemistry      Component Value Date/Time   NA 138 08/11/2023 1323   K 4.3 08/11/2023 1323   CL 104 08/11/2023 1323   CO2 26 08/11/2023 1323   BUN 24 (H) 08/11/2023 1323   CREATININE 1.22 (H) 08/11/2023 1323   CREATININE 1.17 (H) 06/11/2023 1118      Component Value Date/Time   CALCIUM 8.9 08/11/2023 1323   ALKPHOS 46 06/11/2023 1118   AST 15 06/11/2023 1118   ALT 9 06/11/2023 1118  BILITOT 0.6 06/11/2023 1118      No results found for: "TSH" Lab Results  Component Value Date   WBC 18.4 (H) 08/11/2023   HGB 14.1 08/11/2023   HCT 41.7 08/11/2023   MCV 91.9 08/11/2023   PLT 232 08/11/2023      Total time spent on today's visit was 45 minutes, including both face-to-face time and nonface-to-face time.  Time included that spent on review of records (prior notes available to me/labs/imaging if pertinent), discussing treatment and goals, answering patient's questions and coordinating care.  Cc:  Salvatore Decent, FNP

## 2023-08-30 DIAGNOSIS — Z008 Encounter for other general examination: Secondary | ICD-10-CM | POA: Diagnosis not present

## 2023-09-02 ENCOUNTER — Ambulatory Visit (INDEPENDENT_AMBULATORY_CARE_PROVIDER_SITE_OTHER): Payer: Medicare Other | Admitting: Neurology

## 2023-09-02 ENCOUNTER — Encounter: Payer: Self-pay | Admitting: Neurology

## 2023-09-02 VITALS — BP 130/70 | HR 50 | Ht 60.0 in | Wt 148.0 lb

## 2023-09-02 DIAGNOSIS — G250 Essential tremor: Secondary | ICD-10-CM | POA: Diagnosis not present

## 2023-09-02 DIAGNOSIS — J449 Chronic obstructive pulmonary disease, unspecified: Secondary | ICD-10-CM

## 2023-09-02 DIAGNOSIS — Z720 Tobacco use: Secondary | ICD-10-CM | POA: Diagnosis not present

## 2023-09-02 NOTE — Patient Instructions (Signed)
 We discussed that your tremor is not Parkinsons Disease and may be due, in part, to your lung medications.  I do not recommend starting medications right now due to your other medical problems.  The physicians and staff at [facility] are committed to providing excellent care. You may receive a survey requesting feedback about your experience at our office. We strive to receive "very good" responses to the survey questions. If you feel that your experience would prevent you from giving the office a "very good " response, please contact our office to try to remedy the situation. We may be reached at [phone]. Thank you for taking the time out of your busy day to complete the survey.

## 2023-09-03 ENCOUNTER — Encounter: Payer: Self-pay | Admitting: Adult Health

## 2023-09-03 ENCOUNTER — Ambulatory Visit: Payer: Medicare Other | Admitting: Adult Health

## 2023-09-03 VITALS — BP 132/82 | HR 95 | Temp 97.7°F | Ht 60.0 in | Wt 147.6 lb

## 2023-09-03 DIAGNOSIS — I2699 Other pulmonary embolism without acute cor pulmonale: Secondary | ICD-10-CM | POA: Diagnosis not present

## 2023-09-03 DIAGNOSIS — J449 Chronic obstructive pulmonary disease, unspecified: Secondary | ICD-10-CM | POA: Diagnosis not present

## 2023-09-03 MED ORDER — APIXABAN 5 MG PO TABS: 5.0000 mg | ORAL_TABLET | Freq: Two times a day (BID) | ORAL | 3 refills | Status: DC

## 2023-09-03 NOTE — Patient Instructions (Addendum)
 Continue Eliquis 5mg  Twice daily  Refills were sent .  NO NSAIDs -advil, aleve or ibuprofen, etc  Continue on Trelegy inhaler 1 puff daily Albuterol inhaler as needed Continue on Singulair daily Work on quitting smoking .  Advance activity as tolerated.  Follow up with Oncology as planned.  Follow up with Dr. Francine Graven or Egan Berkheimer NP in 2 months and As needed

## 2023-09-03 NOTE — Progress Notes (Signed)
 @Patient  ID: [name], female    DOB: [birth year], 78 y.o.   MRN: [id number]  Chief Complaint  Patient presents with   Follow-up    Referring provider: Macy Mis, MD  HPI: 78 year old female active smoker followed for COPD Medical history significant for Breast Cancer   TEST/EVENTS :  PFTs on 05/20/13 showed FEV1 of 86%-1.76, FVC of 88%-2.39 in ratio 74.   08/2022 Normal PFT   09/03/2023 Follow up : COPD , PE Patient returns for a follow up visit from recent ER visit with dyspnea. Found to have an acute PE. She recently had Covid 19 , treated for COPD exacerbation in setting of Covid 19. She was also on Tamoxifen which has been stopped.  CT chest on 08/11/23 showed small subsegmental PE in lingula and RLL. She was started on Eliquis starter pack. She says she is tolerating without any known difficulties. Patient education given on anticoagulation. Denies any chest pain or hemoptysis. She is feeling better with less dyspnea.  She is very active and is getting back to her normal activities. Has not restarted exercise yet.  Has upcoming appointment with Oncology. She has history of Breast cancer. She was recommended to stop Tamoxifen.  She has not history of VTE . No family history of VTE.  No recent injury or surgery or extended travel.   Remains on Trelegy. Denies any cough or wheezing. No increased albuterol use. No increased albuterol use.  Discussed smoking cessation.     Allergies  Allergen Reactions   Chantix [Varenicline] Other (See Comments)    Makes her feel crazy   Codeine Itching    Immunization History  Administered Date(s) Administered   Fluad Quad(high Dose 65+) 03/11/2017, 03/28/2018, 03/23/2020, 04/07/2021, 04/17/2023   Fluad Trivalent(High Dose 65+) 03/15/2011, 03/11/2017, 03/28/2018   Hep A / Hep B 02/01/2015, 03/15/2015, 08/15/2015, 01/17/2023, 02/23/2023   Influenza Split 03/02/2013   Influenza, High Dose Seasonal PF 04/15/2012, 04/21/2013,  04/27/2014, 05/05/2015, 03/21/2016, 03/24/2019, 02/28/2022   Influenza,inj,Quad PF,6+ Mos 03/21/2016, 03/24/2019   Influenza,trivalent, recombinat, inj, PF 03/28/2010, 04/01/2010, 03/02/2013   PFIZER(Purple Top)SARS-COV-2 Vaccination 07/28/2019, 08/18/2019, 04/07/2020   Pneumococcal Conjugate,unspecified 04/17/2023   Pneumococcal Conjugate-13 04/27/2014, 05/04/2014   Pneumococcal Polysaccharide-23 03/28/2010, 03/28/2010, 07/02/2010, 07/02/2010, 03/21/2016   Respiratory Syncytial Virus Vaccine,Recomb Aduvanted(Arexvy) 02/28/2022   Tdap 03/02/2005, 06/07/2019   Zoster Recombinant(Shingrix) 03/25/2018, 01/07/2019   Zoster, Live 03/28/2010    Past Medical History:  Diagnosis Date   Allergy    Asthma    Atypical chest pain 05/29/2011   Normal cardiology eval: ECHO and stress testing 2012.  Cardiac cath: normal 2014 for worsening dyspnea     Bimalleolar fracture of right ankle 03/20/2017   Breast cancer (HCC)    Bulging lumbar disc    L4 OR L5 TAKES STEROID INJECTIONS FOR TOOK INJECTION MARCH 2019   Chronic bronchitis (HCC) FEW YEARS AGO   Chronic hepatitis C (HCC)    TOOK HARVONI TX 2017    Colon polyp    COPD (chronic obstructive pulmonary disease) (HCC)    Diverticulitis 19YRS AGO   Emphysema of lung (HCC)    TOOK PULMONARY FUCTION TEST AND PASSED FEW WEEKS AGO AT FAMILY  MD   Gallstones    GERD (gastroesophageal reflux disease)    History of kidney stones 20 YRS AGO    Hormone replacement therapy (postmenopausal) 08/22/2012   Hyperlipidemia    Hypertension    Malignant neoplasm of upper-outer quadrant of left breast in female, estrogen receptor positive (HCC)  09/30/2020   Osteoarthritis    Pneumonia AT BIRTH   Spondylosis of cervical region without myelopathy or radiculopathy    Squamous cell carcinoma of skin 06/15/2013   Arms and leg; [facility], Dr. Doreen Beam      Tobacco History: Social History   Tobacco Use  Smoking Status Every Day   Current packs/day:  1.00   Average packs/day: 1 pack/day for 63.1 years (63.1 ttl pk-yrs)   Types: Cigarettes   Start date: 07/20/1960  Smokeless Tobacco Never  Tobacco Comments   Half a pack of cigarettes a day 09/03/2023   Ready to quit: Not Answered Counseling given: Not Answered Tobacco comments: Half a pack of cigarettes a day 09/03/2023   Outpatient Medications Prior to Visit  Medication Sig Dispense Refill   albuterol (VENTOLIN HFA) 108 (90 Base) MCG/ACT inhaler Inhale 2 puffs into the lungs every 6 (six) hours as needed for wheezing or shortness of breath. 18 g 0   amLODipine (NORVASC) 5 MG tablet Take 5 mg by mouth every evening.     APIXABAN (ELIQUIS) VTE STARTER PACK (10MG  AND 5MG ) Take as directed on package: start with two-5mg  tablets twice daily for 7 days. On day 8, switch to one-5mg  tablet twice daily. 74 each 0   atorvastatin (LIPITOR) 20 MG tablet Take 20 mg by mouth every evening.      B Complex-Folic Acid (B COMPLEX VITAMINS, W/ FA,) CAPS Take 1 capsule by mouth daily.     cholecalciferol (VITAMIN D3) 25 MCG (1000 UNIT) tablet Take 1,000 Units by mouth daily.     Fluticasone-Umeclidin-Vilant (TRELEGY ELLIPTA) 100-62.5-25 MCG/ACT AEPB INHALE 1 PUFF ONCE DAILY 60 each 11   GEMTESA 75 MG TABS Take 1 tablet by mouth daily.     ipratropium (ATROVENT) 0.06 % nasal spray Place 2 sprays into both nostrils daily as needed for rhinitis.     levocetirizine (XYZAL) 5 MG tablet      linaclotide (LINZESS) 72 MCG capsule Take 1 capsule (72 mcg total) by mouth daily before breakfast. 8 capsule 0   montelukast (SINGULAIR) 10 MG tablet Take 10 mg by mouth every evening.      PREVIDENT 5000 BOOSTER PLUS 1.1 % PSTE Place 1 application onto teeth daily.     valACYclovir (VALTREX) 1000 MG tablet Take 1 tablet (1,000 mg total) by mouth 2 (two) times daily. 60 tablet 2   venlafaxine XR (EFFEXOR-XR) 37.5 MG 24 hr capsule Take 1 capsule (37.5 mg total) by mouth daily with breakfast. 90 capsule 1   aspirin EC 81 MG  tablet Take 81 mg by mouth daily. Swallow whole. (Patient not taking: Reported on 09/02/2023)     No facility-administered medications prior to visit.     Review of Systems:   Constitutional:   No  weight loss, night sweats,  Fevers, chills, fatigue, or  lassitude.  HEENT:   No headaches,  Difficulty swallowing,  Tooth/dental problems, or  Sore throat,                No sneezing, itching, ear ache, nasal congestion, post nasal drip,   CV:  No chest pain,  Orthopnea, PND, swelling in lower extremities, anasarca, dizziness, palpitations, syncope.   GI  No heartburn, indigestion, abdominal pain, nausea, vomiting, diarrhea, change in bowel habits, loss of appetite, bloody stools.   Resp: No shortness of breath with exertion or at rest.  No excess mucus, no productive cough,  No non-productive cough,  No coughing up of blood.  No  change in color of mucus.  No wheezing.  No chest wall deformity  Skin: no rash or lesions.  GU: no dysuria, change in color of urine, no urgency or frequency.  No flank pain, no hematuria   MS:  No joint pain or swelling.  No decreased range of motion.  No back pain.    Physical Exam  Ht 5' (1.524 m)   Wt 147 lb 9.6 oz (67 kg)   BMI 28.83 kg/m   GEN: A/Ox3; pleasant , NAD, well nourished    EACs-clear, TMs-wnl, NOSE-clear, THROAT-clear, no lesions, no postnasal drip or exudate noted.   NECK:  Supple w/ fair ROM; no JVD; normal carotid impulses w/o bruits; no thyromegaly or nodules palpated; no lymphadenopathy.    RESP  Clear  P & A; w/o, wheezes/ rales/ or rhonchi. no accessory muscle use, no dullness to percussion  CARD:  RRR, no m/r/g, no peripheral edema, pulses intact, no cyanosis or clubbing.  GI:   Soft & nt; nml bowel sounds; no organomegaly or masses detected.   Musco: Warm bil, no deformities or joint swelling noted.   Neuro: alert, no focal deficits noted.    Skin: Warm, no lesions or rashes    Lab Results:  CBC    Component Value  Date/Time   WBC 18.4 (H) 08/11/2023 1323   RBC 4.54 08/11/2023 1323   HGB 14.1 08/11/2023 1323   HGB 13.8 06/11/2023 1118   HCT 41.7 08/11/2023 1323   PLT 232 08/11/2023 1323   PLT 244 06/11/2023 1118   MCV 91.9 08/11/2023 1323   MCH 31.1 08/11/2023 1323   MCHC 33.8 08/11/2023 1323   RDW 13.1 08/11/2023 1323   LYMPHSABS 2.5 08/11/2023 1323   MONOABS 1.0 08/11/2023 1323   EOSABS 0.0 08/11/2023 1323   BASOSABS 0.0 08/11/2023 1323    BMET    Component Value Date/Time   NA 138 08/11/2023 1323   K 4.3 08/11/2023 1323   CL 104 08/11/2023 1323   CO2 26 08/11/2023 1323   GLUCOSE 110 (H) 08/11/2023 1323   BUN 24 (H) 08/11/2023 1323   CREATININE 1.22 (H) 08/11/2023 1323   CREATININE 1.17 (H) 06/11/2023 1118   CALCIUM 8.9 08/11/2023 1323   GFRNONAA 45 (L) 08/11/2023 1323   GFRNONAA 48 (L) 06/11/2023 1118    BNP No results found for: "BNP"  ProBNP No results found for: "PROBNP"  Imaging: CT Angio Chest PE W/Cm &/Or Wo Cm Result Date: 08/11/2023 CLINICAL DATA:  Worsening cough and left-sided chest pain. Recently diagnosed with COVID. EXAM: CT ANGIOGRAPHY CHEST WITH CONTRAST TECHNIQUE: Multidetector CT imaging of the chest was performed using the standard protocol during bolus administration of intravenous contrast. Multiplanar CT image reconstructions and MIPs were obtained to evaluate the vascular anatomy. RADIATION DOSE REDUCTION: This exam was performed according to the departmental dose-optimization program which includes automated exposure control, adjustment of the mA and/or kV according to patient size and/or use of iterative reconstruction technique. CONTRAST:  80mL OMNIPAQUE IOHEXOL 350 MG/ML SOLN COMPARISON:  Chest x-ray from same day. CT chest dated February 07, 2023. FINDINGS: Cardiovascular: Satisfactory opacification of the pulmonary arteries to the segmental level. Small subsegmental pulmonary emboli in the lingula and right lower lobe. Normal heart size. No pericardial  effusion. No thoracic aortic aneurysm or dissection. Coronary, aortic arch, and branch vessel atherosclerotic vascular disease. Mediastinum/Nodes: No enlarged mediastinal, hilar, or axillary lymph nodes. Thyroid gland, trachea, and esophagus demonstrate no significant findings. Lungs/Pleura: Mild paraseptal and centrilobular emphysema again  noted. Similar post radiation change in the anterior left upper lobe. No focal consolidation, pleural effusion, or pneumothorax. Upper Abdomen: No acute abnormality. Musculoskeletal: Postsurgical changes in the left breast. No acute or significant osseous findings. No acute or significant osseous findings. Review of the MIP images confirms the above findings. IMPRESSION: 1. Small subsegmental pulmonary emboli in the lingula and right lower lobe. 2. Aortic Atherosclerosis (ICD10-I70.0) and Emphysema (ICD10-J43.9). Electronically Signed   By: Obie Dredge M.D.   On: 08/11/2023 14:34   DG Chest 2 View Result Date: 08/11/2023 CLINICAL DATA:  COVID infection. EXAM: CHEST - 2 VIEW COMPARISON:  06/22/2013 FINDINGS: Right lung clear. Streaky density at the left base suggest atelectasis or scarring. Surgical clips are noted in the left breast. The cardiopericardial silhouette is within normal limits for size. No acute bony abnormality. IMPRESSION: Streaky density at the left base suggests atelectasis or scarring. Otherwise no acute cardiopulmonary findings. Electronically Signed   By: Kennith Center M.D.   On: 08/11/2023 09:15   DG BONE DENSITY (DXA) Result Date: 08/06/2023 EXAM: DUAL X-RAY ABSORPTIOMETRY (DXA) FOR BONE MINERAL DENSITY IMPRESSION: Referring Physician:  Bryon Lions Your patient completed a bone mineral density test using GE Lunar iDXA system (analysis version: 16). Technologist: BEC PATIENT: Name: Amy, Gothard Patient ID: [id number] Birth Date: 09-24-1945 Height: 58.5 in. Sex: Female Measured: 08/06/2023 Weight: 145.2 lbs. Indications: Advanced Age, Albuterol,  Bilateral Oophorectomy (65.51), Breast Cancer History, Caucasian, COPD, Depression, Effexor, Estrogen Deficient, Height Loss (781.91), History of Fracture (Adult) (V15.51), Hysterectomy, Postmenopausal, Prednisone, Tamoxifen, Tobacco User (Current Smoker), Trelegy Fractures: Left Elbow, Right Ankle, Right Tib-Fib Treatments: Calcium (E943.0), Hormone Therapy For Cancer, Vitamin D (E933.5) ASSESSMENT: The BMD measured at Femur Neck Right is 0.833 g/cm2 with a T-score of -1.5. This patient's diagnostic category is LOW BONE MASS/OSTEOPENIA according to World Health Organization [facility]) criteria. The lumbar spine was excluded due to being excluded from the prior exam. The quality of the exam is good. Site Region Measured Date Measured Age YA BMD Significant CHANGE T-score DualFemur Neck Right 08/06/2023 78.5 -1.5 0.833 g/cm2 * DualFemur Neck Right 11/29/2020 75.9 -1.0 0.898 g/cm2 DualFemur Total Mean 08/06/2023 78.5 -0.3 0.970 g/cm2 DualFemur Total Mean 11/29/2020 75.9 -0.4 0.960 g/cm2 Right Forearm Radius 33% 08/06/2023 78.5 0.1 0.887 g/cm2 Right Forearm Radius 33% 11/29/2020 75.9 0.1 0.901 g/cm2 World Health Organization [facility]) criteria for post-menopausal, Caucasian Women: Normal       T-score at or above -1 SD Osteopenia   T-score between -1 and -2.5 SD Osteoporosis T-score at or below -2.5 SD RECOMMENDATION: 1. All patients should optimize calcium and vitamin D intake. 2. Consider FDA-approved medical therapies in postmenopausal women and men aged 75 years and older, based on the following: a. A hip or vertebral (clinical or morphometric) fracture. b. T-score = -2.5 at the femoral neck or spine after appropriate evaluation to exclude secondary causes. c. Low bone mass (T-score between -1.0 and -2.5 at the femoral neck or spine) and a 10-year probability of a hip fracture = 3% or a 10-year probability of a major osteoporosis-related fracture = 20% based on the [place]-adapted WHO algorithm. d. Clinician judgment and/or  patient preferences may indicate treatment for people with 10-year fracture probabilities above or below these levels. FOLLOW-UP: Patients with diagnosis of osteoporosis or at high risk for fracture should have regular bone mineral density tests.? Patients eligible for Medicare are allowed routine testing every 2 years.? The testing frequency can be increased to one year for patients who have  rapidly progressing disease, are receiving or discontinuing medical therapy to restore bone mass, or have additional risk factors. I have reviewed this study and agree with the findings. [facility] FRAX* 10-year Probability of Fracture Based on femoral neck BMD: DualFemur (Right) Major Osteoporotic Fracture: 18.8% Hip Fracture:                6.0% Population:                  [place] (Caucasian) Risk Factors: History of Fracture (Adult) (V15.51), Tobacco User (Current Smoker) *FRAX is a Armed forces logistics/support/administrative officer of the [employer] of [employer] for Metabolic Bone Disease, a World Science writer (WHO) [employer]. ASSESSMENT: The probability of a major osteoporotic fracture is 18.8% within the next ten years. The probability of a hip fracture is 6.0% within the next ten years. Electronically Signed   By: Harmon Pier M.D.   On: 08/06/2023 14:20    Administration History     None          Latest Ref Rng & Units 08/14/2022    5:04 PM  PFT Results  FVC-Predicted Pre % 85   FVC-Post L 1.85   FVC-Predicted Post % 82   Pre FEV1/FVC % % 75   Post FEV1/FCV % % 81   FEV1-Pre L 1.44   FEV1-Predicted Pre % 86   FEV1-Post L 1.50   DLCO uncorrected ml/min/mmHg 10.42   DLCO UNC% % 63   DLCO corrected ml/min/mmHg 10.42   DLCO COR %Predicted % 63   DLVA Predicted % 83   TLC L 4.30   TLC % Predicted % 96   RV % Predicted % 116     No results found for: "NITRICOXIDE"      Assessment & Plan:   No problem-specific Assessment & Plan notes found for this encounter.     Rubye Oaks, NP 09/03/2023

## 2023-09-08 DIAGNOSIS — I2699 Other pulmonary embolism without acute cor pulmonale: Secondary | ICD-10-CM | POA: Insufficient documentation

## 2023-09-08 NOTE — Assessment & Plan Note (Addendum)
 Continue on Trelegy daily . Appears at baseline. Smoking cessation   Plan  Patient Instructions  Continue Eliquis 5mg  Twice daily  Refills were sent .  NO NSAIDs -advil, aleve or ibuprofen, etc  Continue on Trelegy inhaler 1 puff daily Albuterol inhaler as needed Continue on Singulair daily Work on quitting smoking .  Advance activity as tolerated.  Follow up with Oncology as planned.  Follow up with Dr. Francine Graven or Nkenge Sonntag NP in 2 months and As needed

## 2023-09-08 NOTE — Assessment & Plan Note (Signed)
 Suspected provoked PE developed after acute illness with Covid 19 and COPD exacerbation while taking Tamoxifen. No personal or  family history of VTE.  Will need at least 3 months of therapy . To discuss with oncology regarding Tamoxifen going forward.  Continue on Eliquis , refill sent to pharm.   Plan  Patient Instructions  Continue Eliquis 5mg  Twice daily  Refills were sent .  NO NSAIDs -advil, aleve or ibuprofen, etc  Continue on Trelegy inhaler 1 puff daily Albuterol inhaler as needed Continue on Singulair daily Work on quitting smoking .  Advance activity as tolerated.  Follow up with Oncology as planned.  Follow up with Dr. Francine Graven or Keionte Swicegood NP in 2 months and As needed

## 2023-09-11 ENCOUNTER — Encounter: Payer: Self-pay | Admitting: Internal Medicine

## 2023-09-11 ENCOUNTER — Ambulatory Visit (INDEPENDENT_AMBULATORY_CARE_PROVIDER_SITE_OTHER): Payer: Medicare Other | Admitting: Internal Medicine

## 2023-09-11 VITALS — BP 124/82 | HR 94 | Temp 98.2°F | Ht 60.0 in | Wt 146.2 lb

## 2023-09-11 DIAGNOSIS — G8928 Other chronic postprocedural pain: Secondary | ICD-10-CM

## 2023-09-11 DIAGNOSIS — J449 Chronic obstructive pulmonary disease, unspecified: Secondary | ICD-10-CM | POA: Diagnosis not present

## 2023-09-11 DIAGNOSIS — I2699 Other pulmonary embolism without acute cor pulmonale: Secondary | ICD-10-CM

## 2023-09-11 DIAGNOSIS — N644 Mastodynia: Secondary | ICD-10-CM

## 2023-09-11 NOTE — Progress Notes (Signed)
 [facility] [address] [place] [address] Dept: [phone] Dept Fax: [phone]    Subjective:   [name] 1945-09-05 09/11/2023  Chief Complaint  Patient presents with   Follow-up   Fatigue    HPI: [name] presents today for re-assessment and management of chronic medical conditions.  Discussed the use of AI scribe software for clinical note transcription with the patient, who gave verbal consent to proceed.  History of Present Illness   The patient, with a history of COPD, presents for follow-up on recent pulmonary emboli s/p COVID 19 infection. She is currently on Eliquis and reports no issues with the medication. She expresses dissatisfaction with the lack of recent lab work or scans to monitor the status of the blood clots, expressing a desire to know if the clots have resolved so she can resume normal activities. She reports noticing pain with taking a deep inhalation, and increased fatigue since her COVID-19 infection. She also mentions a deepening of her voice, which she attributes to bronchitis and smoking. She has been advised to take short walks and play a modified version of pickleball to avoid falls while on blood thinners. She also reports chronic pain in the chest area where she had a left breast lumpectomy, which worsens with coughing or turning.       The following portions of the patient's history were reviewed and updated as appropriate: past medical history, past surgical history, family history, social history, allergies, medications, and problem list.   Patient Active Problem List   Diagnosis Date Noted   Pulmonary embolism (HCC) 09/08/2023   Mixed hyperlipidemia 07/11/2023   Tobacco use 07/11/2023   Herpes simplex type 1 infection 07/11/2023   History of breast cancer 07/11/2023   Prediabetes 03/10/2021   Overactive bladder 11/02/2019   Aortic atherosclerosis (HCC)  03/24/2019   Localized osteoarthritis of right ankle 10/02/2017   Retained orthopedic hardware 10/02/2017   Urge incontinence of urine 07/24/2016   Fibrosis of liver 02/01/2015   Spondylosis of cervical region without myelopathy or radiculopathy 11/02/2014   Depression with anxiety 12/25/2011   HTN (hypertension) 12/25/2011   GERD (gastroesophageal reflux disease) 12/25/2011   COPD, moderate (HCC) 12/25/2011   Chronic constipation 12/25/2011   Seasonal allergic rhinitis 10/02/2011   Osteoarthritis, knee 11/16/2010   Past Medical History:  Diagnosis Date   Allergy    Asthma    Atypical chest pain 05/29/2011   Normal cardiology eval: ECHO and stress testing 2012.  Cardiac cath: normal 2014 for worsening dyspnea     Bimalleolar fracture of right ankle 03/20/2017   Breast cancer (HCC)    Bulging lumbar disc    L4 OR L5 TAKES STEROID INJECTIONS FOR TOOK INJECTION MARCH 2019   Chronic bronchitis (HCC) FEW YEARS AGO   Chronic hepatitis C (HCC)    TOOK HARVONI [place] 2017    Colon polyp    COPD (chronic obstructive pulmonary disease) (HCC)    Diverticulitis 51YRS AGO   Emphysema of lung (HCC)    TOOK PULMONARY FUCTION TEST AND PASSED FEW WEEKS AGO AT [facility]ILY  MD   Gallstones    GERD (gastroesophageal reflux disease)    History of kidney stones 20 YRS AGO    Hormone replacement therapy (postmenopausal) 08/22/2012   Hyperlipidemia    Hypertension    Malignant neoplasm of upper-outer quadrant of left breast in female, estrogen receptor positive (HCC) 09/30/2020   Osteoarthritis    Pneumonia AT BIRTH  Spondylosis of cervical region without myelopathy or radiculopathy    Squamous cell carcinoma of skin 06/15/2013   Arms and leg; [facility], Dr. Doreen Beam     Past Surgical History:  Procedure Laterality Date   ABDOMINAL HYSTERECTOMY  1980   PARTIAL   ANKLE ARTHROSCOPY Right 10/02/2017   Procedure: RIGHT ANKLE ARTHROSCOPY WITH EXTENSIVE DEBRIDEMENT;  Surgeon: Jodi Geralds,  MD;  Location: [facility] [place];  Service: Orthopedics;  Laterality: Right;  90 MINUTES FOR CASE   BREAST BIOPSY  02/07/2010   BREAST LUMPECTOMY WITH RADIOACTIVE SEED LOCALIZATION Left 10/21/2020   Procedure: LEFT BREAST LUMPECTOMY WITH RADIOACTIVE SEED LOCALIZATION;  Surgeon: Abigail Miyamoto, MD;  Location: [facility] OR;  Service: General;  Laterality: Left;  60 MINUTES ROOM 2   BREAST SURGERY  2011   biopsy   CATARACT EXTRACTION     CHOLECYSTECTOMY  1999   LAPAROSCOPIC   EYE SURGERY     FRACTURE SURGERY     HARDWARE REMOVAL Right 10/02/2017   Procedure: HARDWARE REMOVAL;  Surgeon: Jodi Geralds, MD;  Location: [facility] [place];  Service: Orthopedics;  Laterality: Right;   INJECTION OF SILICONE OIL Left 01/04/2020   Procedure: INJECTION OF SILICONE OIL;  Surgeon: Stephannie Li, MD;  Location: [facility] OR;  Service: Ophthalmology;  Laterality: Left;   MEMBRANE PEEL Left 01/04/2020   Procedure: MEMBRANE PEEL;  Surgeon: Stephannie Li, MD;  [facility];  Service: Ophthalmology;  Laterality: Left;   ORIF ANKLE FRACTURE Right 03/20/2017   Procedure: OPEN REDUCTION INTERNAL FIXATION (ORIF) ANKLE FRACTURE;  Surgeon: Jodi Geralds, MD;  Location: [facility];  Service: Orthopedics;  Laterality: Right;   PARS PLANA VITRECTOMY Left 01/04/2020   Procedure: PARS PLANA VITRECTOMY WITH 25 GAUGE;  Surgeon: Stephannie Li, MD;  Location: [facility] OR;  Service: Ophthalmology;  Laterality: Left;   PHOTOCOAGULATION WITH LASER Left 01/04/2020   Procedure: PHOTOCOAGULATION WITH LASER;  Surgeon: Stephannie Li, MD;  Location: [facility] OR;  Service: Ophthalmology;  Laterality: Left;   TONSILLECTOMY     TONSILLECTOMY AND ADENOIDECTOMY  1950's   TUBAL LIGATION     Family History  Problem Relation Age of Onset   Heart disease Mother    Stroke Brother    Stroke Maternal Uncle    COPD Paternal Uncle    Colon cancer Neg Hx    Stomach cancer Neg Hx    Esophageal cancer Neg Hx     Current  Outpatient Medications:    albuterol (VENTOLIN HFA) 108 (90 Base) MCG/ACT inhaler, Inhale 2 puffs into the lungs every 6 (six) hours as needed for wheezing or shortness of breath., Disp: 18 g, Rfl: 0   amLODipine (NORVASC) 5 MG tablet, Take 5 mg by mouth every evening., Disp: , Rfl:    APIXABAN (ELIQUIS) VTE STARTER PACK (10MG  AND 5MG ), Take as directed on package: start with two-5mg  tablets twice daily for 7 days. On day 8, switch to one-5mg  tablet twice daily., Disp: 74 each, Rfl: 0   aspirin EC 81 MG tablet, Take 81 mg by mouth daily. Swallow whole., Disp: , Rfl:    atorvastatin (LIPITOR) 20 MG tablet, Take 20 mg by mouth every evening. , Disp: , Rfl:    B Complex-Folic Acid (B COMPLEX VITAMINS, W/ FA,) CAPS, Take 1 capsule by mouth daily., Disp: , Rfl:    cholecalciferol (VITAMIN D3) 25 MCG (1000 UNIT) tablet, Take 1,000 Units by mouth daily., Disp: , Rfl:    Fluticasone-Umeclidin-Vilant (TRELEGY ELLIPTA) 100-62.5-25 MCG/ACT AEPB, INHALE 1  PUFF ONCE DAILY, Disp: 60 each, Rfl: 11   GEMTESA 75 MG TABS, Take 1 tablet by mouth daily., Disp: , Rfl:    ipratropium (ATROVENT) 0.06 % nasal spray, Place 2 sprays into both nostrils daily as needed for rhinitis., Disp: , Rfl:    levocetirizine (XYZAL) 5 MG tablet, , Disp: , Rfl:    linaclotide (LINZESS) 72 MCG capsule, Take 1 capsule (72 mcg total) by mouth daily before breakfast., Disp: 8 capsule, Rfl: 0   montelukast (SINGULAIR) 10 MG tablet, Take 10 mg by mouth every evening. , Disp: , Rfl:    PREVIDENT 5000 BOOSTER PLUS 1.1 % PSTE, Place 1 application onto teeth daily., Disp: , Rfl:    valACYclovir (VALTREX) 1000 MG tablet, Take 1 tablet (1,000 mg total) by mouth 2 (two) times daily., Disp: 60 tablet, Rfl: 2   venlafaxine XR (EFFEXOR-XR) 37.5 MG 24 hr capsule, Take 1 capsule (37.5 mg total) by mouth daily with breakfast., Disp: 90 capsule, Rfl: 1   apixaban (ELIQUIS) 5 MG TABS tablet, Take 1 tablet (5 mg total) by mouth 2 (two) times daily., Disp: 60  tablet, Rfl: 3 Allergies  Allergen Reactions   Chantix [Varenicline] Other (See Comments)    Makes her feel crazy   Codeine Itching     ROS: A complete ROS was performed with pertinent positives/negatives noted in the HPI. The remainder of the ROS are negative.    Objective:   Today's Vitals   09/11/23 1031  BP: 124/82  Pulse: 94  Temp: 98.2 F (36.8 C)  TempSrc: Temporal  SpO2: 98%  Weight: 146 lb 3.2 oz (66.3 kg)  Height: 5' (1.524 m)    GENERAL: Well-appearing, in NAD. Well nourished.  SKIN: Pink, warm and dry. No rash, lesion, ulceration, or ecchymoses.  NECK: Trachea midline. Full ROM w/o pain or tenderness. No lymphadenopathy.  RESPIRATORY: Chest wall symmetrical. Respirations even and non-labored. Breath sounds clear to auscultation bilaterally.  CARDIAC: S1, S2 present, regular rate and rhythm. Peripheral pulses 2+ bilaterally.  EXTREMITIES: Without clubbing, cyanosis, or edema.  NEUROLOGIC: No motor or sensory deficits. Steady, even gait.  PSYCH/MENTAL STATUS: Alert, oriented x 3. Cooperative, appropriate mood and affect.   There are no preventive care reminders to display for this patient.  No results found for any visits on 09/11/23.  The 10-year ASCVD risk score (Arnett DK, et al., 2019) is: 35.2%     Assessment & Plan:  Assessment and Plan    Pulmonary Embolism Small subsegmental pulmonary emboli in the lingula and right lower lobe, likely provoked by previous COVID-19 infection. No cardiac strain noted. Explained that repeat scan may not be necessary if asymptomatic. - Continue Eliquis as prescribed. - Follow up with pulmonologist in two months. - Consider repeat CTA if symptoms persist after three months. - Advised patient if fall occurs and she hits her head while on blood thinners, she needs to go to the ER or call 911.   Chronic Obstructive Pulmonary Disease (COPD) COPD may contribute to breathing difficulties. - Encourage short walks and avoid  high-risk activities. - Monitor breathing symptoms and report any worsening.  Chronic Post-breast lumpectomy pain Pain likely musculoskeletal from surgical incision, not cardiac. - Manage pain with conservative measures. - continue follow up with oncologist      No orders of the defined types were placed in this encounter.  No images are attached to the encounter or orders placed in the encounter. No orders of the defined types were placed in this  encounter.   Return in about 3 months (around 12/12/2023) for Chronic Condition follow up .   Salvatore Decent, FNP

## 2023-09-30 ENCOUNTER — Other Ambulatory Visit: Payer: Self-pay

## 2023-09-30 DIAGNOSIS — Z008 Encounter for other general examination: Secondary | ICD-10-CM | POA: Diagnosis not present

## 2023-09-30 DIAGNOSIS — C50412 Malignant neoplasm of upper-outer quadrant of left female breast: Secondary | ICD-10-CM

## 2023-09-30 NOTE — Assessment & Plan Note (Signed)
 stage IA pT1c, cN0, grade 1 -Diagnosed 08/2020 s/p left lumpectomy 10/21/20 by Dr. 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 Posterior margin involved by DCIS. -S/p adjuvant radiation 12/05/20 - 12/30/20 under Dr. Zaiah Hansen. -she was started on tamoxifen on 01/30/21, goal 5 years

## 2023-10-01 ENCOUNTER — Encounter: Payer: Self-pay | Admitting: Hematology

## 2023-10-01 ENCOUNTER — Inpatient Hospital Stay: Payer: Medicare Other | Attending: Hematology

## 2023-10-01 ENCOUNTER — Inpatient Hospital Stay (HOSPITAL_BASED_OUTPATIENT_CLINIC_OR_DEPARTMENT_OTHER): Payer: Medicare Other | Admitting: Hematology

## 2023-10-01 VITALS — BP 131/73 | HR 82 | Temp 98.1°F | Resp 17 | Ht 60.0 in | Wt 147.2 lb

## 2023-10-01 DIAGNOSIS — Z170 Estrogen receptor positive status [ER+]: Secondary | ICD-10-CM | POA: Diagnosis not present

## 2023-10-01 DIAGNOSIS — J449 Chronic obstructive pulmonary disease, unspecified: Secondary | ICD-10-CM | POA: Insufficient documentation

## 2023-10-01 DIAGNOSIS — I2699 Other pulmonary embolism without acute cor pulmonale: Secondary | ICD-10-CM | POA: Diagnosis not present

## 2023-10-01 DIAGNOSIS — C50412 Malignant neoplasm of upper-outer quadrant of left female breast: Secondary | ICD-10-CM | POA: Diagnosis not present

## 2023-10-01 DIAGNOSIS — Z1231 Encounter for screening mammogram for malignant neoplasm of breast: Secondary | ICD-10-CM

## 2023-10-01 DIAGNOSIS — Z7901 Long term (current) use of anticoagulants: Secondary | ICD-10-CM | POA: Diagnosis not present

## 2023-10-01 DIAGNOSIS — M8580 Other specified disorders of bone density and structure, unspecified site: Secondary | ICD-10-CM | POA: Diagnosis not present

## 2023-10-01 DIAGNOSIS — Z8616 Personal history of COVID-19: Secondary | ICD-10-CM | POA: Diagnosis not present

## 2023-10-01 DIAGNOSIS — Z780 Asymptomatic menopausal state: Secondary | ICD-10-CM | POA: Insufficient documentation

## 2023-10-01 LAB — CBC WITH DIFFERENTIAL (CANCER CENTER ONLY)
Abs Immature Granulocytes: 0.02 10*3/uL (ref 0.00–0.07)
Basophils Absolute: 0 10*3/uL (ref 0.0–0.1)
Basophils Relative: 0 %
Eosinophils Absolute: 0.2 10*3/uL (ref 0.0–0.5)
Eosinophils Relative: 2 %
HCT: 40.2 % (ref 36.0–46.0)
Hemoglobin: 13.6 g/dL (ref 12.0–15.0)
Immature Granulocytes: 0 %
Lymphocytes Relative: 26 %
Lymphs Abs: 2 10*3/uL (ref 0.7–4.0)
MCH: 31.1 pg (ref 26.0–34.0)
MCHC: 33.8 g/dL (ref 30.0–36.0)
MCV: 92 fL (ref 80.0–100.0)
Monocytes Absolute: 0.5 10*3/uL (ref 0.1–1.0)
Monocytes Relative: 7 %
Neutro Abs: 5.1 10*3/uL (ref 1.7–7.7)
Neutrophils Relative %: 65 %
Platelet Count: 229 10*3/uL (ref 150–400)
RBC: 4.37 MIL/uL (ref 3.87–5.11)
RDW: 12.7 % (ref 11.5–15.5)
WBC Count: 7.9 10*3/uL (ref 4.0–10.5)
nRBC: 0 % (ref 0.0–0.2)

## 2023-10-01 LAB — CMP (CANCER CENTER ONLY)
ALT: 9 U/L (ref 0–44)
AST: 17 U/L (ref 15–41)
Albumin: 4.2 g/dL (ref 3.5–5.0)
Alkaline Phosphatase: 45 U/L (ref 38–126)
Anion gap: 6 (ref 5–15)
BUN: 21 mg/dL (ref 8–23)
CO2: 28 mmol/L (ref 22–32)
Calcium: 9.5 mg/dL (ref 8.9–10.3)
Chloride: 108 mmol/L (ref 98–111)
Creatinine: 1.01 mg/dL — ABNORMAL HIGH (ref 0.44–1.00)
GFR, Estimated: 57 mL/min — ABNORMAL LOW (ref >60)
Glucose, Bld: 119 mg/dL — ABNORMAL HIGH (ref 70–99)
Potassium: 4.3 mmol/L (ref 3.5–5.1)
Sodium: 142 mmol/L (ref 135–145)
Total Bilirubin: 0.5 mg/dL (ref 0.0–1.2)
Total Protein: 7.1 g/dL (ref 6.5–8.1)

## 2023-10-01 MED ORDER — ANASTROZOLE 1 MG PO TABS: 1.0000 mg | ORAL_TABLET | Freq: Every day | ORAL | 3 refills | Status: DC

## 2023-10-01 NOTE — Progress Notes (Signed)
 [facility]   [phone] Fax:([phone]) [phone]   Clinic Follow up Note   Patient Care Team: Salvatore Decent, FNP as PCP - General (Internal Medicine) Pershing Proud, RN as Oncology Nurse Navigator Donnelly Angelica, RN as Oncology Nurse Navigator Abigail Miyamoto, MD as Consulting Physician (General Surgery) Dorothy Puffer, MD as Consulting Physician (Radiation Oncology) Stephannie Li, MD as Consulting Physician (Ophthalmology) Andrena Mews, MD as Referring Physician (Gastroenterology) Sherre Scarlet, MD as Referring Physician (Obstetrics and Gynecology) Aris Lot, MD as Consulting Physician (Dermatology) Malachy Mood, MD as Attending Physician (Hematology and Oncology) Tat, Octaviano Batty, DO as Consulting Physician (Neurology)  Date of Service:  10/01/2023  CHIEF COMPLAINT: f/u of recent PE  CURRENT THERAPY:  Eliquis 5 mg twice daily  Oncology History   Malignant neoplasm of upper-outer quadrant of left breast in female, 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 Posterior margin involved by DCIS. -S/p adjuvant radiation 12/05/20 - 12/30/20 under Dr. Sojourner Hansen. -she was started on tamoxifen on 01/30/21, goal 5 years    Assessment and Plan    Breast Cancer (Stage 1A) Stage 1A breast cancer managed with tamoxifen since August 2022. Tamoxifen discontinued due to blood clots, likely related to tamoxifen or recent COVID-19 infection. Anastrozole considered as an alternative, with a 40% chance of joint pain, but safer given her history of blood clots. - Discontinue tamoxifen. - Initiate anastrozole therapy, monitoring for joint pain. - Consider alternative medications like exemestane if joint pain occurs.  Pulmonary Embolism, provoked  Small pulmonary emboli likely related to tamoxifen or recent COVID-19 infection. On Eliquis since February 9th,  advised for three months, up to six months. Clots expected to resolve without follow-up CT unless symptoms persist. Gradual resumption of physical activities advised, with caution due to anticoagulation therapy. - Continue Eliquis until May 9th. - Transition to low-dose aspirin (81 mg) after completing Eliquis. - Advise on gradual resumption of physical activities with caution to avoid injury.  COPD COPD management may influence anticoagulation duration for pulmonary embolism. Coordination with pulmonologist required to determine appropriate Eliquis duration. - Coordinate with pulmonologist regarding the duration of Eliquis therapy.  Osteopenia Diagnosed with osteopenia, advised for oral medication but not started due to recent health issues. Discussed intravenous Zemaira, administered every six months, may cause temporary fatigue and body aches. Zemaira preferred for bone strengthening and reducing breast cancer metastasis risk. - Obtain dental clearance for Zemaira infusion. - Initiate Zemaira infusion every six months after dental clearance and completion of Eliquis therapy.  Plan -I recommend switching tamoxifen to anastrozole, I called in today. -Continue Eliquis for 3 months, until mid May 2025, I refilled for her today -I recommend Zometa infusion every 6 months for 2 years for her osteopenia, due to her history of breast cancer -I give her her dental clearance letter for Zometa, she is scheduled to see dentist soon to have a crown -First Zometa infusion in 2 months with her next appointment with NP Lacie  -order screening MM for 04/2024        SUMMARY OF ONCOLOGIC HISTORY: Oncology History  Malignant neoplasm of upper-outer quadrant of left breast in female, estrogen receptor positive (HCC) (Resolved)  09/30/2020 Initial Diagnosis   Malignant neoplasm of upper-outer quadrant of left breast in female, estrogen receptor positive (HCC)   10/05/2020 Cancer Staging   Staging form:  Breast, AJCC 8th Edition - Clinical  stage from 10/05/2020: Stage IA (cT1c, cN0, cM0, G2, ER+, PR+, HER2-) - Signed by Lowella Dell, MD on 10/05/2020 Stage prefix: Initial diagnosis Method of lymph node assessment: Clinical Histologic grading system: 3 grade system   10/21/2020 Cancer Staging   Staging form: Breast, AJCC 8th Edition - Pathologic stage from 10/21/2020: Stage IA (pT1c, pN0, cM0, G1, ER+, PR+, HER2-) - Signed by Loa Socks, NP on 11/02/2020 Histologic grading system: 3 grade system      Discussed the use of AI scribe software for clinical note transcription with the patient, who gave verbal consent to proceed.  History of Present Illness   The patient, a 78 year old female with a history of stage 1A breast cancer, presents for follow-up. She reports recent health issues including COVID-19, bronchitis, and blood clots in the lungs. The blood clots were discovered after the patient experienced severe back pain following her recovery from COVID-19. She denies experiencing shortness of breath but reports body aches. The patient was previously on tamoxifen for breast cancer, but it was stopped due to the risk of blood clots.  The patient also has osteopenia and was prescribed medication for it, but she has not started taking it yet due to her recent health issues. The patient is active and enjoys playing pickleball, but she has been advised to take it slow due to her recent health issues. She reports some pain in her left breast due to scar tissue from previous surgery.         All other systems were reviewed with the patient and are negative.  MEDICAL HISTORY:  Past Medical History:  Diagnosis Date   Allergy    Asthma    Atypical chest pain 05/29/2011   Normal cardiology eval: ECHO and stress testing 2012.  Cardiac cath: normal 2014 for worsening dyspnea     Bimalleolar fracture of right ankle 03/20/2017   Breast cancer (HCC)    Bulging lumbar disc    L4 OR L5  TAKES STEROID INJECTIONS FOR TOOK INJECTION MARCH 2019   Chronic bronchitis (HCC) FEW YEARS AGO   Chronic hepatitis C (HCC)    TOOK HARVONI TX 2017    Colon polyp    COPD (chronic obstructive pulmonary disease) (HCC)    Diverticulitis 85YRS AGO   Emphysema of lung (HCC)    TOOK PULMONARY FUCTION TEST AND PASSED FEW WEEKS AGO AT [facility]ILY  MD   Gallstones    GERD (gastroesophageal reflux disease)    History of kidney stones 20 YRS AGO    Hormone replacement therapy (postmenopausal) 08/22/2012   Hyperlipidemia    Hypertension    Malignant neoplasm of upper-outer quadrant of left breast in female, 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 Doreen Beam      SURGICAL HISTORY: Past Surgical History:  Procedure Laterality Date   ABDOMINAL HYSTERECTOMY  1980   PARTIAL   ANKLE ARTHROSCOPY Right 10/02/2017   Procedure: RIGHT ANKLE ARTHROSCOPY WITH EXTENSIVE DEBRIDEMENT;  Surgeon: Jodi Geralds, MD;  Location: [facility];  Service: Orthopedics;  Laterality: Right;  90 MINUTES FOR CASE   BREAST BIOPSY  02/07/2010   BREAST LUMPECTOMY WITH RADIOACTIVE SEED LOCALIZATION Left 10/21/2020   Procedure: LEFT BREAST LUMPECTOMY WITH RADIOACTIVE SEED LOCALIZATION;  Surgeon: Abigail Miyamoto, MD;  Location: [facility] OR;  Service: General;  Laterality: Left;  60 MINUTES ROOM 2   BREAST SURGERY  2011   biopsy   CATARACT EXTRACTION     CHOLECYSTECTOMY  1999   LAPAROSCOPIC   EYE SURGERY     FRACTURE SURGERY     HARDWARE REMOVAL Right 10/02/2017   Procedure: HARDWARE REMOVAL;  Surgeon: Jodi Geralds, MD;  Location: [facility];  Service: Orthopedics;  Laterality: Right;   INJECTION OF SILICONE OIL Left 01/04/2020   Procedure: INJECTION OF SILICONE OIL;  Surgeon: Stephannie Li, MD;   Location: [facility] OR;  Service: Ophthalmology;  Laterality: Left;   MEMBRANE PEEL Left 01/04/2020   Procedure: MEMBRANE PEEL;  Surgeon: Stephannie Li, MD;  Location: [facility] OR;  Service: Ophthalmology;  Laterality: Left;   ORIF ANKLE FRACTURE Right 03/20/2017   Procedure: OPEN REDUCTION INTERNAL FIXATION (ORIF) ANKLE FRACTURE;  Surgeon: Jodi Geralds, MD;  Location: [facility];  Service: Orthopedics;  Laterality: Right;   PARS PLANA VITRECTOMY Left 01/04/2020   Procedure: PARS PLANA VITRECTOMY WITH 25 GAUGE;  Surgeon: Stephannie Li, MD;  [facility] OR;  Service: Ophthalmology;  Laterality: Left;   PHOTOCOAGULATION WITH LASER Left 01/04/2020   Procedure: PHOTOCOAGULATION WITH LASER;  Surgeon: Stephannie Li, MD;  Location: [facility] OR;  Service: Ophthalmology;  Laterality: Left;   TONSILLECTOMY     TONSILLECTOMY AND ADENOIDECTOMY  1950's   TUBAL LIGATION      I have reviewed the social history and family history with the patient and they are unchanged from previous note.  ALLERGIES:  is allergic to chantix [varenicline] and codeine.  MEDICATIONS:  Current Outpatient Medications  Medication Sig Dispense Refill   anastrozole (ARIMIDEX) 1 MG tablet Take 1 tablet (1 mg total) by mouth daily. 30 tablet 3   albuterol (VENTOLIN HFA) 108 (90 Base) MCG/ACT inhaler Inhale 2 puffs into the lungs every 6 (six) hours as needed for wheezing or shortness of breath. 18 g 0   amLODipine (NORVASC) 5 MG tablet Take 5 mg by mouth every evening.     apixaban (ELIQUIS) 5 MG TABS tablet Take 1 tablet (5 mg total) by mouth 2 (two) times daily. 60 tablet 3   APIXABAN (ELIQUIS) VTE STARTER PACK (10MG  AND 5MG ) Take as directed on package: start with two-5mg  tablets twice daily for 7 days. On day 8, switch to one-5mg  tablet twice daily. 74 each 0   aspirin EC 81 MG tablet Take 81 mg by mouth daily. Swallow whole.     atorvastatin (LIPITOR) 20 MG tablet Take 20 mg by mouth every evening.      B Complex-Folic Acid  (B COMPLEX VITAMINS, W/ FA,) CAPS Take 1 capsule by mouth daily.     cholecalciferol (VITAMIN D3) 25 MCG (1000 UNIT) tablet Take 1,000 Units by mouth daily.     Fluticasone-Umeclidin-Vilant (TRELEGY ELLIPTA) 100-62.5-25 MCG/ACT AEPB INHALE 1 PUFF ONCE DAILY 60 each 11   GEMTESA 75 MG TABS Take 1 tablet by mouth daily.     ipratropium (ATROVENT) 0.06 % nasal spray Place 2 sprays into both nostrils daily as needed for rhinitis.     levocetirizine (XYZAL) 5 MG tablet      linaclotide (LINZESS) 72 MCG capsule Take 1 capsule (72 mcg total) by mouth daily before breakfast. 8 capsule 0   montelukast (SINGULAIR) 10 MG tablet Take 10 mg by mouth every evening.      PREVIDENT 5000 BOOSTER PLUS 1.1 % PSTE Place 1 application onto teeth daily.     valACYclovir (VALTREX) 1000 MG  tablet Take 1 tablet (1,000 mg total) by mouth 2 (two) times daily. 60 tablet 2   venlafaxine XR (EFFEXOR-XR) 37.5 MG 24 hr capsule Take 1 capsule (37.5 mg total) by mouth daily with breakfast. 90 capsule 1   No current facility-administered medications for this visit.    PHYSICAL EXAMINATION: ECOG PERFORMANCE STATUS: 0 - Asymptomatic  Vitals:   10/01/23 1121  BP: 131/73  Pulse: 82  Resp: 17  Temp: 98.1 F (36.7 C)  SpO2: 98%   Wt Readings from Last 3 Encounters:  10/01/23 147 lb 3.2 oz (66.8 kg)  09/11/23 146 lb 3.2 oz (66.3 kg)  09/03/23 147 lb 9.6 oz (67 kg)     GENERAL:alert, no distress and comfortable SKIN: skin color, texture, turgor are normal, no rashes or significant lesions EYES: normal, Conjunctiva are pink and non-injected, sclera clear NECK: supple, thyroid normal size, non-tender, without nodularity LYMPH:  no palpable lymphadenopathy in the cervical, axillary  LUNGS: clear to auscultation and percussion with normal breathing effort HEART: regular rate & rhythm and no murmurs and no lower extremity edema ABDOMEN:abdomen soft, non-tender and normal bowel sounds Musculoskeletal:no cyanosis of digits  and no clubbing  NEURO: alert & oriented x 3 with fluent speech, no focal motor/sensory deficits   LABORATORY DATA:  I have reviewed the data as listed    Latest Ref Rng & Units 10/01/2023   10:52 AM 08/11/2023    1:23 PM 06/11/2023   11:18 AM  CBC  WBC 4.0 - 10.5 K/uL 7.9  18.4  7.6   Hemoglobin 12.0 - 15.0 g/dL 16.1  09.6  04.5   Hematocrit 36.0 - 46.0 % 40.2  41.7  41.0   Platelets 150 - 400 K/uL 229  232  244         Latest Ref Rng & Units 10/01/2023   10:52 AM 08/11/2023    1:23 PM 06/11/2023   11:18 AM  CMP  Glucose 70 - 99 mg/dL 409  811  914   BUN 8 - 23 mg/dL 21  24  14    Creatinine 0.44 - 1.00 mg/dL 7.82  9.56  2.13   Sodium 135 - 145 mmol/L 142  138  141   Potassium 3.5 - 5.1 mmol/L 4.3  4.3  4.9   Chloride 98 - 111 mmol/L 108  104  106   CO2 22 - 32 mmol/L 28  26  30    Calcium 8.9 - 10.3 mg/dL 9.5  8.9  9.4   Total Protein 6.5 - 8.1 g/dL 7.1   6.9   Total Bilirubin 0.0 - 1.2 mg/dL 0.5   0.6   Alkaline Phos 38 - 126 U/L 45   46   AST 15 - 41 U/L 17   15   ALT 0 - 44 U/L 9   9       RADIOGRAPHIC STUDIES: I have personally reviewed the radiological images as listed and agreed with the findings in the report. No results found.    Orders Placed This Encounter  Procedures   MM Digital Screening    Standing Status:   Future    Expected Date:   04/20/2024    Expiration Date:   09/30/2024    Reason for Exam (SYMPTOM  OR DIAGNOSIS REQUIRED):   screening    Preferred imaging location?:   [facility]   All questions were answered. The patient knows to call the clinic with any problems, questions or concerns. No barriers to learning was detected.  The total time spent in the appointment was 40 minutes.     Malachy Mood, MD 10/01/2023

## 2023-10-07 ENCOUNTER — Ambulatory Visit: Payer: Self-pay

## 2023-10-07 NOTE — Telephone Encounter (Signed)
  Chief Complaint: Flank pain  Symptoms: Constipation, right side pain, lower back pain, 4/10 pain Frequency: Comes and goes onset Thursday Pertinent Negatives: Patient denies fever, nausea, vomiting Disposition: [] ED /[] Urgent Care (no appt availability in office) / [x] Appointment(In office/virtual)/ []  [facility]/ [] Home Care/ [] Refused Recommended Disposition /[]  Mobile Bus/ []  Follow-up with PCP Additional Notes: Patient reports right sided flank pain that radiates to the lower back. Patient states she feels like this sometimes when she is constipated and she has been constipated for a few days but is currently having a bowel movement as we speak. Patient states she was previously prescribed Linzess 100mg  and it was too strong than she was given a sample of Linzess 72 mg and it was helpful. Patient would like to be prescribed Linzess 72 mg to see if it improves her symptoms. Care advice was given and patient has been scheduled with PCP on Wednesday.  Copied from CRM [id number]. Topic: Clinical - Red Word Triage >> Oct 07, 2023  4:45 PM Melissa C wrote: Red Word that prompted transfer to Nurse Triage: patient has been having bad pain in her side since Thursday Reason for Disposition  [1] MILD pain (i.e., scale 1-3; does not interfere with normal activities) AND [2] present > 3 days  Answer Assessment - Initial Assessment Questions 1. LOCATION: "Where does it hurt?" (e.g., left, right)     Right side  2. ONSET: "When did the pain start?"     Thursday  3. SEVERITY: "How bad is the pain?" (e.g., Scale 1-10; mild, moderate, or severe)   - MILD (1-3): doesn't interfere with normal activities    - MODERATE (4-7): interferes with normal activities or awakens from sleep    - SEVERE (8-10): excruciating pain and patient unable to do normal activities (stays in bed)       Mild to moderate  4. PATTERN: "Does the pain come and go, or is it constant?"      Come and go  5. CAUSE:  "What do you think is causing the pain?"     Maybe the constipation  6. OTHER SYMPTOMS:  "Do you have any other symptoms?" (e.g., fever, abdomen pain, vomiting, leg weakness, burning with urination, blood in urine)     Constipation,  Protocols used: Flank Pain-A-AH

## 2023-10-08 NOTE — Progress Notes (Unsigned)
 [facility] [address] [place] [address] Dept: [phone] Dept Fax: [phone]  Acute Care Office Visit  Subjective:   [name] Sep 09, 1945 10/09/2023  No chief complaint on file.   HPI: Discussed the use of AI scribe software for clinical note transcription with the patient, who gave verbal consent to proceed.  History of Present Illness      The following portions of the patient's history were reviewed and updated as appropriate: past medical history, past surgical history, family history, social history, allergies, medications, and problem list.   Patient Active Problem List   Diagnosis Date Noted   Osteopenia after menopause 10/01/2023   Pulmonary embolism (HCC) 09/08/2023   Mixed hyperlipidemia 07/11/2023   Tobacco use 07/11/2023   Herpes simplex type 1 infection 07/11/2023   History of breast cancer 07/11/2023   Prediabetes 03/10/2021   Overactive bladder 11/02/2019   Aortic atherosclerosis (HCC) 03/24/2019   Localized osteoarthritis of right ankle 10/02/2017   Retained orthopedic hardware 10/02/2017   Urge incontinence of urine 07/24/2016   Fibrosis of liver 02/01/2015   Spondylosis of cervical region without myelopathy or radiculopathy 11/02/2014   Depression with anxiety 12/25/2011   HTN (hypertension) 12/25/2011   GERD (gastroesophageal reflux disease) 12/25/2011   COPD, moderate (HCC) 12/25/2011   Chronic constipation 12/25/2011   Seasonal allergic rhinitis 10/02/2011   Osteoarthritis, knee 11/16/2010   Past Medical History:  Diagnosis Date   Allergy    Asthma    Atypical chest pain 05/29/2011   Normal cardiology eval: ECHO and stress testing 2012.  Cardiac cath: normal 2014 for worsening dyspnea     Bimalleolar fracture of right ankle 03/20/2017   Breast cancer (HCC)    Bulging lumbar disc    L4 OR L5 TAKES STEROID INJECTIONS FOR TOOK INJECTION MARCH 2019   Chronic  bronchitis (HCC) FEW YEARS AGO   Chronic hepatitis C (HCC)    TOOK HARVONI TX 2017    Colon polyp    COPD (chronic obstructive pulmonary disease) (HCC)    Diverticulitis 70YRS AGO   Emphysema of lung (HCC)    TOOK PULMONARY FUCTION TEST AND PASSED FEW WEEKS AGO AT [facility]ILY  MD   Gallstones    GERD (gastroesophageal reflux disease)    History of kidney stones 20 YRS AGO    Hormone replacement therapy (postmenopausal) 08/22/2012   Hyperlipidemia    Hypertension    Malignant neoplasm of upper-outer quadrant of left breast in female, 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 Doreen Beam     Past Surgical History:  Procedure Laterality Date   ABDOMINAL HYSTERECTOMY  1980   PARTIAL   ANKLE ARTHROSCOPY Right 10/02/2017   Procedure: RIGHT ANKLE ARTHROSCOPY WITH EXTENSIVE DEBRIDEMENT;  Surgeon: Jodi Geralds, MD;  Location: [facility];  Service: Orthopedics;  Laterality: Right;  90 MINUTES FOR CASE   BREAST BIOPSY  02/07/2010   BREAST LUMPECTOMY WITH RADIOACTIVE SEED LOCALIZATION Left 10/21/2020   Procedure: LEFT BREAST LUMPECTOMY WITH RADIOACTIVE SEED LOCALIZATION;  Surgeon: Abigail Miyamoto, MD;  Location: [facility] OR;  Service: General;  Laterality: Left;  60 MINUTES ROOM 2   BREAST SURGERY  2011   biopsy   CATARACT EXTRACTION     CHOLECYSTECTOMY  1999   LAPAROSCOPIC   EYE SURGERY  FRACTURE SURGERY     HARDWARE REMOVAL Right 10/02/2017   Procedure: HARDWARE REMOVAL;  Surgeon: Jodi Geralds, MD;  Location: [facility];  Service: Orthopedics;  Laterality: Right;   INJECTION OF SILICONE OIL Left 01/04/2020   Procedure: INJECTION OF SILICONE OIL;  Surgeon: Stephannie Li, MD;  Location: [facility] OR;  Service: Ophthalmology;  Laterality: Left;   MEMBRANE PEEL Left 01/04/2020    Procedure: MEMBRANE PEEL;  Surgeon: Stephannie Li, MD;  Location: [facility] OR;  Service: Ophthalmology;  Laterality: Left;   ORIF ANKLE FRACTURE Right 03/20/2017   Procedure: OPEN REDUCTION INTERNAL FIXATION (ORIF) ANKLE FRACTURE;  Surgeon: Jodi Geralds, MD;  Location: [facility];  Service: Orthopedics;  Laterality: Right;   PARS PLANA VITRECTOMY Left 01/04/2020   Procedure: PARS PLANA VITRECTOMY WITH 25 GAUGE;  Surgeon: Stephannie Li, MD;  Location: [facility] OR;  Service: Ophthalmology;  Laterality: Left;   PHOTOCOAGULATION WITH LASER Left 01/04/2020   Procedure: PHOTOCOAGULATION WITH LASER;  Surgeon: Stephannie Li, MD;  Location: [facility] OR;  Service: Ophthalmology;  Laterality: Left;   TONSILLECTOMY     TONSILLECTOMY AND ADENOIDECTOMY  1950's   TUBAL LIGATION     Family History  Problem Relation Age of Onset   Heart disease Mother    Stroke Brother    Stroke Maternal Uncle    COPD Paternal Uncle    Colon cancer Neg Hx    Stomach cancer Neg Hx    Esophageal cancer Neg Hx     Current Outpatient Medications:    albuterol (VENTOLIN HFA) 108 (90 Base) MCG/ACT inhaler, Inhale 2 puffs into the lungs every 6 (six) hours as needed for wheezing or shortness of breath., Disp: 18 g, Rfl: 0   amLODipine (NORVASC) 5 MG tablet, Take 5 mg by mouth every evening., Disp: , Rfl:    anastrozole (ARIMIDEX) 1 MG tablet, Take 1 tablet (1 mg total) by mouth daily., Disp: 30 tablet, Rfl: 3   apixaban (ELIQUIS) 5 MG TABS tablet, Take 1 tablet (5 mg total) by mouth 2 (two) times daily., Disp: 60 tablet, Rfl: 3   APIXABAN (ELIQUIS) VTE STARTER PACK (10MG  AND 5MG ), Take as directed on package: start with two-5mg  tablets twice daily for 7 days. On day 8, switch to one-5mg  tablet twice daily., Disp: 74 each, Rfl: 0   aspirin EC 81 MG tablet, Take 81 mg by mouth daily. Swallow whole., Disp: , Rfl:    atorvastatin (LIPITOR) 20 MG tablet, Take 20 mg by mouth every evening. , Disp: , Rfl:    B Complex-Folic Acid  (B COMPLEX VITAMINS, W/ FA,) CAPS, Take 1 capsule by mouth daily., Disp: , Rfl:    cholecalciferol (VITAMIN D3) 25 MCG (1000 UNIT) tablet, Take 1,000 Units by mouth daily., Disp: , Rfl:    Fluticasone-Umeclidin-Vilant (TRELEGY ELLIPTA) 100-62.5-25 MCG/ACT AEPB, INHALE 1 PUFF ONCE DAILY, Disp: 60 each, Rfl: 11   GEMTESA 75 MG TABS, Take 1 tablet by mouth daily., Disp: , Rfl:    ipratropium (ATROVENT) 0.06 % nasal spray, Place 2 sprays into both nostrils daily as needed for rhinitis., Disp: , Rfl:    levocetirizine (XYZAL) 5 MG tablet, , Disp: , Rfl:    linaclotide (LINZESS) 72 MCG capsule, Take 1 capsule (72 mcg total) by mouth daily before breakfast., Disp: 8 capsule, Rfl: 0   montelukast (SINGULAIR) 10 MG tablet, Take 10 mg by mouth every evening. , Disp: , Rfl:    PREVIDENT 5000 BOOSTER PLUS 1.1 % PSTE, Place 1 application onto  teeth daily., Disp: , Rfl:    valACYclovir (VALTREX) 1000 MG tablet, Take 1 tablet (1,000 mg total) by mouth 2 (two) times daily., Disp: 60 tablet, Rfl: 2   venlafaxine XR (EFFEXOR-XR) 37.5 MG 24 hr capsule, Take 1 capsule (37.5 mg total) by mouth daily with breakfast., Disp: 90 capsule, Rfl: 1 Allergies  Allergen Reactions   Chantix [Varenicline] Other (See Comments)    Makes her feel crazy   Codeine Itching     ROS: A complete ROS was performed with pertinent positives/negatives noted in the HPI. The remainder of the ROS are negative.    Objective:   There were no vitals filed for this visit.  GENERAL: Well-appearing, in NAD. Well nourished.  SKIN: Pink, warm and dry. No rash, lesion, ulceration, or ecchymoses.  NECK: Trachea midline. Full ROM w/o pain or tenderness. No lymphadenopathy.  RESPIRATORY: Chest wall symmetrical. Respirations even and non-labored. Breath sounds clear to auscultation bilaterally.  CARDIAC: S1, S2 present, regular rate and rhythm. Peripheral pulses 2+ bilaterally.  GI: Abdomen soft, non-tender. Normoactive bowel sounds. No rebound  tenderness. No hepatomegaly or splenomegaly. No CVA tenderness.  EXTREMITIES: Without clubbing, cyanosis, or edema.  NEUROLOGIC: No motor or sensory deficits. Steady, even gait.  PSYCH/MENTAL STATUS: Alert, oriented x 3. Cooperative, appropriate mood and affect.    No results found for any visits on 10/09/23.    Assessment & Plan:  Assessment and Plan Assessment & Plan       There are no diagnoses linked to this encounter. No orders of the defined types were placed in this encounter.  No orders of the defined types were placed in this encounter.  Lab Orders  No laboratory test(s) ordered today   No images are attached to the encounter or orders placed in the encounter.  No follow-ups on file.   Salvatore Decent, FNP

## 2023-10-09 ENCOUNTER — Encounter: Payer: Self-pay | Admitting: Internal Medicine

## 2023-10-09 ENCOUNTER — Ambulatory Visit (INDEPENDENT_AMBULATORY_CARE_PROVIDER_SITE_OTHER): Admitting: Internal Medicine

## 2023-10-09 VITALS — BP 139/86 | HR 90 | Temp 98.4°F | Resp 18 | Wt 144.8 lb

## 2023-10-09 DIAGNOSIS — N189 Chronic kidney disease, unspecified: Secondary | ICD-10-CM | POA: Diagnosis not present

## 2023-10-09 DIAGNOSIS — K5909 Other constipation: Secondary | ICD-10-CM | POA: Diagnosis not present

## 2023-10-09 DIAGNOSIS — R7303 Prediabetes: Secondary | ICD-10-CM

## 2023-10-09 MED ORDER — LINACLOTIDE 72 MCG PO CAPS: 72.0000 ug | ORAL_CAPSULE | Freq: Every day | ORAL | 2 refills | Status: DC | PRN

## 2023-10-09 NOTE — Patient Instructions (Signed)
 Continue Colace 1 capsule 2 times a day  Can do 1 scoop of Miralax once daily  If no improvement, start Linzess.

## 2023-10-14 ENCOUNTER — Other Ambulatory Visit: Payer: Self-pay

## 2023-10-14 ENCOUNTER — Encounter (HOSPITAL_COMMUNITY): Payer: Self-pay

## 2023-10-14 ENCOUNTER — Emergency Department (HOSPITAL_COMMUNITY)
Admission: EM | Admit: 2023-10-14 | Discharge: 2023-10-14 | Discharge status: Against Medical Advice | Attending: Emergency Medicine | Admitting: Emergency Medicine

## 2023-10-14 ENCOUNTER — Emergency Department (HOSPITAL_COMMUNITY)

## 2023-10-14 DIAGNOSIS — S0990XA Unspecified injury of head, initial encounter: Secondary | ICD-10-CM | POA: Insufficient documentation

## 2023-10-14 DIAGNOSIS — Z5321 Procedure and treatment not carried out due to patient leaving prior to being seen by health care provider: Secondary | ICD-10-CM | POA: Insufficient documentation

## 2023-10-14 DIAGNOSIS — W2109XA Struck by other hit or thrown ball, initial encounter: Secondary | ICD-10-CM | POA: Diagnosis not present

## 2023-10-14 DIAGNOSIS — R519 Headache, unspecified: Secondary | ICD-10-CM | POA: Diagnosis not present

## 2023-10-14 DIAGNOSIS — Y9373 Activity, racquet and hand sports: Secondary | ICD-10-CM | POA: Insufficient documentation

## 2023-10-14 NOTE — ED Notes (Signed)
 Pt completed CT- come to window and stated she will check MYChart and will come back if she needs to.

## 2023-10-14 NOTE — ED Notes (Signed)
 Pt taken to CT.

## 2023-10-14 NOTE — ED Provider Triage Note (Cosign Needed)
 Emergency Medicine Provider Triage Evaluation Note  [name] , a 78 y.o. female  was evaluated in triage.  Pt complains of head ache. Patient was hit in the head with a pickle ball and has mild HA over area of injury. No fall, no deformity. No acute change in vision or blurry vision. No high velocity trauma or fall.   Review of Systems  Positive: Head ache Negative: Focal weakness or deficit   Physical Exam  BP (!) 166/92 (BP Location: Right Arm)   Pulse (!) 107   Temp 98.3 F (36.8 C) (Oral)   Resp 16   Ht 5' (1.524 m)   Wt 65 kg   SpO2 100%   BMI 27.99 kg/m  Gen:   Awake, no distress   Resp:  Normal effort  MSK:   Moves extremities without difficulty  Other:    Medical Decision Making  Medically screening exam initiated at 12:36 PM.  Appropriate orders placed.  [name] was informed that the remainder of the evaluation will be completed by another provider, this initial triage assessment does not replace that evaluation, and the importance of remaining in the ED until their evaluation is complete.     Kelsey Heron, PA-C 10/14/23 1238

## 2023-10-14 NOTE — ED Triage Notes (Signed)
 Pt arrived reporting hit in head with pickle ball prior to arrival. Denies LOC. Pt is currently on blood thinner. No obvious injury to head. Reports left eye vision change, states that eye was already impaired. Denies any difficulty walking, no vomiting, dizziness or any other symptoms.

## 2023-10-16 DIAGNOSIS — H59812 Chorioretinal scars after surgery for detachment, left eye: Secondary | ICD-10-CM | POA: Diagnosis not present

## 2023-10-16 DIAGNOSIS — H43811 Vitreous degeneration, right eye: Secondary | ICD-10-CM | POA: Diagnosis not present

## 2023-10-16 DIAGNOSIS — H30042 Focal chorioretinal inflammation, macular or paramacular, left eye: Secondary | ICD-10-CM | POA: Diagnosis not present

## 2023-10-16 DIAGNOSIS — H35352 Cystoid macular degeneration, left eye: Secondary | ICD-10-CM | POA: Diagnosis not present

## 2023-10-18 DIAGNOSIS — Z9049 Acquired absence of other specified parts of digestive tract: Secondary | ICD-10-CM | POA: Diagnosis not present

## 2023-10-18 DIAGNOSIS — K7469 Other cirrhosis of liver: Secondary | ICD-10-CM | POA: Diagnosis not present

## 2023-10-18 DIAGNOSIS — K7460 Unspecified cirrhosis of liver: Secondary | ICD-10-CM | POA: Diagnosis not present

## 2023-10-22 DIAGNOSIS — B182 Chronic viral hepatitis C: Secondary | ICD-10-CM | POA: Diagnosis not present

## 2023-10-22 DIAGNOSIS — K7469 Other cirrhosis of liver: Secondary | ICD-10-CM | POA: Diagnosis not present

## 2023-10-22 DIAGNOSIS — B1920 Unspecified viral hepatitis C without hepatic coma: Secondary | ICD-10-CM | POA: Diagnosis not present

## 2023-11-05 ENCOUNTER — Ambulatory Visit: Admitting: Adult Health

## 2023-11-05 ENCOUNTER — Encounter: Payer: Self-pay | Admitting: Adult Health

## 2023-11-05 VITALS — BP 106/80 | HR 98 | Temp 97.8°F | Ht 60.0 in | Wt 146.4 lb

## 2023-11-05 DIAGNOSIS — Z86711 Personal history of pulmonary embolism: Secondary | ICD-10-CM

## 2023-11-05 DIAGNOSIS — J432 Centrilobular emphysema: Secondary | ICD-10-CM | POA: Diagnosis not present

## 2023-11-05 DIAGNOSIS — F17210 Nicotine dependence, cigarettes, uncomplicated: Secondary | ICD-10-CM | POA: Diagnosis not present

## 2023-11-05 DIAGNOSIS — Z7901 Long term (current) use of anticoagulants: Secondary | ICD-10-CM

## 2023-11-05 DIAGNOSIS — I2699 Other pulmonary embolism without acute cor pulmonale: Secondary | ICD-10-CM

## 2023-11-05 DIAGNOSIS — Z720 Tobacco use: Secondary | ICD-10-CM

## 2023-11-05 NOTE — Progress Notes (Addendum)
Error, see note

## 2023-11-05 NOTE — Patient Instructions (Addendum)
 Decrease Eliquis   2.5mg  Twice daily  NO NSAIDs -advil, aleve or ibuprofen, etc  Continue on Trelegy inhaler 1 puff daily Albuterol  inhaler as needed Continue on Singulair  daily Work on quitting smoking .  Advance activity as tolerated.  Follow up with Oncology as planned.  Follow up with Dr. Diania Fortes or Brynli Ollis NP in 6 months and As needed

## 2023-11-05 NOTE — Progress Notes (Unsigned)
 @Patient  ID: [name], female    DOB: 1945/09/21, 78 y.o.   MRN: [id number]  Chief Complaint  Patient presents with   Follow-up    Referring provider: Gavin Kast, FNP  HPI: 78 year old female active smoker followed for Emphysema .  Diagnosed with acute PE February 2025-felt to be provoked in the setting of acute illness with COVID-19 infection and concurrent use of tamoxifen  Medical history significant for breast cancer  TEST/EVENTS :  PFTs on 05/20/13 showed FEV1 of 86%-1.76, FVC of 88%-2.39 in ratio 74.    08/2022 Normal PFT   CT chest on 08/11/23 showed small subsegmental PE in lingula and RLL mild emphysema, postradiation change in the left upper lobe.  11/05/2023 Follow up : PE, Emphysema   Discussed the use of AI scribe software for clinical note transcription with the patient, who gave verbal consent to proceed.  History of Present Illness   [name] is a 78 year old female returns for a 75-month follow-up.  Patient was seen in the emergency room August 11, 2023 with acute shortness of breath.  Prior to visit she had acute illness with COVID-19 and COPD exacerbation.  CT chest on August 11, 2023 showed small subsegmental PE in the lingula and right lower lobe.  Patient was started on Eliquis .  She had no prior history of VTE.  Denies any known family history of VTE.  Patient has a history of breast cancer stage I status post lumpectomy and radiation.  Patient was on tamoxifen .  Tamoxifen  was stopped.  Oncology changed her to anastrozole .  Patient endorses Eliquis  compliance.  Denies any known side effects.  She remains very active plays pickle ball on a regular basis.  Did have a recent injury playing pickle ball and was seen in the emergency room.  She got hit in the head with the pickleball.  CT head was negative for acute process. Patient has mild emphysema.  Denies any shortness of breath, cough, or issues with breathing, and her breathing remains unchanged   She has a history of frequent bronchitis but notes no worsening of symptoms.  Rare use of her albuterol .  Does take Singulair  for chronic allergy symptoms.  We discussed smoking cessation.  She has cut back on her smoking.      Allergies  Allergen Reactions   Chantix [Varenicline] Other (See Comments)    Makes her feel crazy   Codeine Itching    Immunization History  Administered Date(s) Administered   Fluad Quad(high Dose 65+) 03/11/2017, 03/28/2018, 03/23/2020, 04/07/2021, 04/17/2023   Fluad Trivalent(High Dose 65+) 03/15/2011, 03/11/2017, 03/28/2018   Hep A / Hep B 02/01/2015, 03/15/2015, 08/15/2015, 01/17/2023, 02/23/2023, 07/29/2023   Influenza Split 03/02/2013   Influenza, High Dose Seasonal PF 04/15/2012, 04/21/2013, 04/27/2014, 05/05/2015, 03/21/2016, 03/24/2019, 02/28/2022, 04/17/2023   Influenza,inj,Quad PF,6+ Mos 03/21/2016, 03/24/2019   Influenza,trivalent, recombinat, inj, PF 03/28/2010, 04/01/2010, 03/02/2013   PFIZER(Purple Top)SARS-COV-2 Vaccination 07/28/2019, 08/18/2019, 04/07/2020   PNEUMOCOCCAL CONJUGATE-20 04/17/2023   Pneumococcal Conjugate,unspecified 04/17/2023   Pneumococcal Conjugate-13 04/27/2014, 05/04/2014   Pneumococcal Polysaccharide-23 03/28/2010, 03/28/2010, 07/02/2010, 07/02/2010, 03/21/2016   Respiratory Syncytial Virus Vaccine,Recomb Aduvanted(Arexvy) 02/28/2022   Tdap 03/02/2005, 06/07/2019   Zoster Recombinant(Shingrix) 03/25/2018, 01/07/2019   Zoster, Live 03/28/2010    Past Medical History:  Diagnosis Date   Allergy    Asthma    Atypical chest pain 05/29/2011   Normal cardiology eval: ECHO and stress testing 2012.  Cardiac cath: normal 2014 for worsening dyspnea     Bimalleolar fracture of  right ankle 03/20/2017   Breast cancer (HCC)    Bulging lumbar disc    L4 OR L5 TAKES STEROID INJECTIONS FOR TOOK INJECTION MARCH 2019   Chronic bronchitis (HCC) FEW YEARS AGO   Chronic hepatitis C (HCC)    TOOK HARVONI TX 2017    Colon polyp     COPD (chronic obstructive pulmonary disease) (HCC)    Diverticulitis 43YRS AGO   Emphysema of lung (HCC)    TOOK PULMONARY FUCTION TEST AND PASSED FEW WEEKS AGO AT [facility]ILY  MD   Gallstones    GERD (gastroesophageal reflux disease)    History of kidney stones 20 YRS AGO    Hormone replacement therapy (postmenopausal) 08/22/2012   Hyperlipidemia    Hypertension    Malignant neoplasm of upper-outer quadrant of left breast in female, 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 Theron Flavin      Tobacco History: Social History   Tobacco Use  Smoking Status Every Day   Current packs/day: 1.00   Average packs/day: 1 pack/day for 63.3 years (63.3 ttl pk-yrs)   Types: Cigarettes   Start date: 07/20/1960  Smokeless Tobacco Never  Tobacco Comments   Half a pack of cigarettes a day.  Not trying to quit.  11/05/2023 hfb RN   Ready to quit: Not Answered Counseling given: Not Answered Tobacco comments: Half a pack of cigarettes a day.  Not trying to quit.  11/05/2023 hfb RN   Outpatient Medications Prior to Visit  Medication Sig Dispense Refill   albuterol  (VENTOLIN  HFA) 108 (90 Base) MCG/ACT inhaler Inhale 2 puffs into the lungs every 6 (six) hours as needed for wheezing or shortness of breath. 18 g 0   amLODipine  (NORVASC ) 5 MG tablet Take 5 mg by mouth every evening.     anastrozole  (ARIMIDEX ) 1 MG tablet Take 1 tablet (1 mg total) by mouth daily. 30 tablet 3   aspirin EC 81 MG tablet Take 81 mg by mouth daily. Swallow whole.     atorvastatin  (LIPITOR) 20 MG tablet Take 20 mg by mouth every evening.      B Complex-Folic Acid (B COMPLEX VITAMINS, W/ FA,) CAPS Take 1 capsule by mouth daily.     cholecalciferol (VITAMIN D3) 25 MCG (1000 UNIT) tablet Take 1,000 Units by mouth daily.      Fluticasone-Umeclidin-Vilant (TRELEGY ELLIPTA ) 100-62.5-25 MCG/ACT AEPB INHALE 1 PUFF ONCE DAILY 60 each 11   FLUZONE HIGH-DOSE 0.5 ML injection      GEMTESA 75 MG TABS Take 1 tablet by mouth daily.     ipratropium (ATROVENT) 0.06 % nasal spray Place 2 sprays into both nostrils daily as needed for rhinitis.     levocetirizine (XYZAL) 5 MG tablet      linaclotide  (LINZESS ) 72 MCG capsule Take 1 capsule (72 mcg total) by mouth daily as needed (constipation). 30 capsule 2   montelukast  (SINGULAIR ) 10 MG tablet Take 10 mg by mouth every evening.      Na Sulfate-K Sulfate-Mg Sulfate concentrate (SUPREP) 17.5-3.13-1.6 GM/177ML SOLN See admin instructions.     PREVIDENT  5000 BOOSTER PLUS 1.1 % PSTE Place 1 application onto teeth daily.     PREVNAR 20 0.5 ML injection      Trospium Chloride 60 MG CP24 Take 1 capsule by mouth every morning.  venlafaxine  XR (EFFEXOR -XR) 37.5 MG 24 hr capsule Take 1 capsule (37.5 mg total) by mouth daily with breakfast. 90 capsule 1   apixaban  (ELIQUIS ) 5 MG TABS tablet Take 1 tablet (5 mg total) by mouth 2 (two) times daily. 60 tablet 3   APIXABAN  (ELIQUIS ) VTE STARTER PACK (10MG  AND 5MG ) Take as directed on package: start with two-5mg  tablets twice daily for 7 days. On day 8, switch to one-5mg  tablet twice daily. (Patient not taking: Reported on 11/05/2023) 74 each 0   tamoxifen  (NOLVADEX ) 20 MG tablet Take 20 mg by mouth daily.     No facility-administered medications prior to visit.     Review of Systems:   Constitutional:   No  weight loss, night sweats,  Fevers, chills, fatigue, or  lassitude.  HEENT:   No headaches,  Difficulty swallowing,  Tooth/dental problems, or  Sore throat,                No sneezing, itching, ear ache, nasal congestion, post nasal drip,   CV:  No chest pain,  Orthopnea, PND, swelling in lower extremities, anasarca, dizziness, palpitations, syncope.   GI  No heartburn, indigestion, abdominal pain, nausea, vomiting, diarrhea, change in  bowel habits, loss of appetite, bloody stools.   Resp: No shortness of breath with exertion or at rest.  No excess mucus, no productive cough,  No non-productive cough,  No coughing up of blood.  No change in color of mucus.  No wheezing.  No chest wall deformity  Skin: no rash or lesions.  GU: no dysuria, change in color of urine, no urgency or frequency.  No flank pain, no hematuria   MS:  No joint pain or swelling.  No decreased range of motion.  No back pain.    Physical Exam  BP 106/80 (BP Location: Left Arm, Patient Position: Sitting, Cuff Size: Large)   Pulse 98   Temp 97.8 F (36.6 C) (Oral)   Ht 5' (1.524 m)   Wt 146 lb 6.4 oz (66.4 kg)   SpO2 100%   BMI 28.59 kg/m   GEN: A/Ox3; pleasant , NAD, well nourished    HEENT:  [place]/AT,   NOSE-clear, THROAT-clear, no lesions, no postnasal drip or exudate noted.   NECK:  Supple w/ fair ROM; no JVD; normal carotid impulses w/o bruits; no thyromegaly or nodules palpated; no lymphadenopathy.    RESP  Clear  P & A; w/o, wheezes/ rales/ or rhonchi. no accessory muscle use, no dullness to percussion  CARD:  RRR, no m/r/g, no peripheral edema, pulses intact, no cyanosis or clubbing.  GI:   Soft & nt; nml bowel sounds; no organomegaly or masses detected.   Musco: Warm bil, no deformities or joint swelling noted.   Neuro: alert, no focal deficits noted.    Skin: Warm, no lesions or rashes    Lab Results:  CBC  BNP No results found for: "BNP"  ProBNP No results found for: "PROBNP"  Imaging: CT Head Wo Contrast Result Date: 10/14/2023 CLINICAL DATA:  Headache, new onset (Age >= 51y) EXAM: CT HEAD WITHOUT CONTRAST TECHNIQUE: Contiguous axial images were obtained from the base of the skull through the vertex without intravenous contrast. RADIATION DOSE REDUCTION: This exam was performed according to the departmental dose-optimization program which includes automated exposure control, adjustment of the mA and/or kV according  to patient size and/or use of iterative reconstruction technique. COMPARISON:  None Available. FINDINGS: Brain: There is periventricular white matter decreased attenuation consistent with  small vessel ischemic changes. Gray-white differentiation is preserved. No acute intracranial hemorrhage, mass effect or shift. No hydrocephalus. Vascular: No hyperdense vessel or unexpected calcification. Skull: Normal. Negative for fracture or focal lesion. Sinuses/Orbits: No acute finding. IMPRESSION: Periventricular white matter changes consistent with chronic small vessel ischemia. No acute intracranial process identified. Electronically Signed   By: Sydell Eva M.D.   On: 10/14/2023 18:25    Administration History     None          Latest Ref Rng & Units 08/14/2022    5:04 PM  PFT Results  FVC-Predicted Pre % 85   FVC-Post L 1.85   FVC-Predicted Post % 82   Pre FEV1/FVC % % 75   Post FEV1/FCV % % 81   FEV1-Pre L 1.44   FEV1-Predicted Pre % 86   FEV1-Post L 1.50   DLCO uncorrected ml/min/mmHg 10.42   DLCO UNC% % 63   DLCO corrected ml/min/mmHg 10.42   DLCO COR %Predicted % 63   DLVA Predicted % 83   TLC L 4.30   TLC % Predicted % 96   RV % Predicted % 116     No results found for: "NITRICOXIDE"      Assessment & Plan:   No problem-specific Assessment & Plan notes found for this encounter.     Roena Clark, NP 11/05/2023

## 2023-11-06 DIAGNOSIS — J432 Centrilobular emphysema: Secondary | ICD-10-CM | POA: Insufficient documentation

## 2023-11-06 MED ORDER — APIXABAN 2.5 MG PO TABS: 2.5000 mg | ORAL_TABLET | Freq: Two times a day (BID) | ORAL | 11 refills | Status: AC

## 2023-11-06 NOTE — Assessment & Plan Note (Addendum)
 Provoked PE after acute illness with COVID-19 infection/COPD exacerbation February 2025 along with concurrent use of tamoxifen .  No personal or family history of VTE.  Tamoxifen  has been changed to anastrozole .  Most likely will need to be on anastrozole  for additional 2 and half years.  Case discussed with Dr Diania Fortes.  She has completed full course of Eliquis  for 3 months.  With ongoing use of anastrozole  she does have a increased risk of VTE. Will decrease Eliquis  to 2.5 mg twice daily.  Remain on lower dose AC until off Anastrozole . Anticoagulation therapy education given-avoid nonsteroidals. New Rx sent in for Eliquis  2.5mg  Twice daily  . Patient aware

## 2023-11-06 NOTE — Assessment & Plan Note (Addendum)
 Emphysema.  Patient has minimal symptom burden.  Albuterol  inhaler as needed.  Remains very active.  Smoking cessation is key.  Plan  Patient Instructions  Decrease Eliquis   2.5mg  Twice daily  NO NSAIDs -advil, aleve or ibuprofen, etc  Continue on Trelegy inhaler 1 puff daily Albuterol  inhaler as needed Continue on Singulair  daily Work on quitting smoking .  Advance activity as tolerated.  Follow up with Oncology as planned.  Follow up with Dr. Diania Fortes or Jessa Stinson NP in 6 months and As needed

## 2023-11-06 NOTE — Assessment & Plan Note (Signed)
 Smoking cessation is key.  Continue with the lung cancer CT screening program.

## 2023-12-09 ENCOUNTER — Other Ambulatory Visit: Payer: Self-pay

## 2023-12-09 DIAGNOSIS — C50412 Malignant neoplasm of upper-outer quadrant of left female breast: Secondary | ICD-10-CM

## 2023-12-09 NOTE — Progress Notes (Unsigned)
 [facility]   [phone] [phone]    Patient Care Team: Gavin Kast, FNP as PCP - General (Internal Medicine) Auther Bo, RN as Oncology Nurse Navigator Alane Hsu, RN as Oncology Nurse Navigator Oza Blumenthal, MD as Consulting Physician (General Surgery) Johna Myers, MD as Consulting Physician (Radiation Oncology) Linard Reno, MD as Consulting Physician (Ophthalmology) Eula Hey, MD as Referring Physician (Gastroenterology) Adan Holms, MD as Referring Physician (Obstetrics and Gynecology) Gaynelle Keeling, MD as Consulting Physician (Dermatology) Sonja [place], MD as Attending Physician (Hematology and Oncology) Tat, Von [place], DO as Consulting Physician (Neurology)   CHIEF COMPLAINT: Follow up left breast cancer and PE  Oncology History  Malignant neoplasm of upper-outer quadrant of left breast in female, estrogen receptor positive (HCC) (Resolved)  09/30/2020 Initial Diagnosis   Malignant neoplasm of upper-outer quadrant of left breast in female, estrogen receptor positive (HCC)   10/05/2020 Cancer Staging   Staging form: Breast, AJCC 8th Edition - Clinical stage from 10/05/2020: Stage IA (cT1c, cN0, cM0, G2, ER+, PR+, HER2-) - Signed by Willy Harvest, MD on 10/05/2020 Stage prefix: Initial diagnosis Method of lymph node assessment: Clinical Histologic grading system: 3 grade system   10/21/2020 Cancer Staging   Staging form: Breast, AJCC 8th Edition - Pathologic stage from 10/21/2020: Stage IA (pT1c, pN0, cM0, G1, ER+, PR+, HER2-) - Signed by Percival Brace, NP on 11/02/2020 Histologic grading system: 3 grade system      CURRENT THERAPY: Tamoxifen , 01/2021, changed to Anastrozole  10/2023 with PE  INTERVAL HISTORY [name] returns for follow up. Last seen by Dr. Maryalice Smaller 10/2023 with recent PE. Completed eliquis  in May and changed to aspirin. Tolerating Anastrozole    ROS   Past Medical History:   Diagnosis Date   Allergy    Asthma    Atypical chest pain 05/29/2011   Normal cardiology eval: ECHO and stress testing 2012.  Cardiac cath: normal 2014 for worsening dyspnea     Bimalleolar fracture of right ankle 03/20/2017   Breast cancer (HCC)    Bulging lumbar disc    L4 OR L5 TAKES STEROID INJECTIONS FOR TOOK INJECTION MARCH 2019   Chronic bronchitis (HCC) FEW YEARS AGO   Chronic hepatitis C (HCC)    TOOK HARVONI [place] 2017    Colon polyp    COPD (chronic obstructive pulmonary disease) (HCC)    Diverticulitis 54YRS AGO   Emphysema of lung (HCC)    TOOK PULMONARY FUCTION TEST AND PASSED FEW WEEKS AGO AT [facility]ILY  MD   Gallstones    GERD (gastroesophageal reflux disease)    History of kidney stones 20 YRS AGO    Hormone replacement therapy (postmenopausal) 08/22/2012   Hyperlipidemia    Hypertension    Malignant neoplasm of upper-outer quadrant of left breast in female, estrogen receptor positive (HCC) 09/30/2020   Osteoarthritis    Pneumonia AT BIRTH   Spondylosis of cervical region without myelopathy or radiculopathy    Squamous cell carcinoma of skin 06/15/2013   Arms and leg; [facility], Dr. Theron Flavin       Past Surgical History:  Procedure Laterality Date   ABDOMINAL HYSTERECTOMY  1980   PARTIAL   ANKLE ARTHROSCOPY Right 10/02/2017   Procedure: RIGHT ANKLE ARTHROSCOPY WITH EXTENSIVE DEBRIDEMENT;  Surgeon: Neil Balls, MD;  Location: [facility] [place];  Service: Orthopedics;  Laterality: Right;  90 MINUTES FOR CASE   BREAST BIOPSY  02/07/2010   BREAST LUMPECTOMY WITH RADIOACTIVE SEED  LOCALIZATION Left 10/21/2020   Procedure: LEFT BREAST LUMPECTOMY WITH RADIOACTIVE SEED LOCALIZATION;  Surgeon: Oza Blumenthal, MD;  Location: [facility] OR;  Service: General;  Laterality: Left;  60 MINUTES ROOM 2   BREAST SURGERY  2011   biopsy   CATARACT EXTRACTION     CHOLECYSTECTOMY  1999   LAPAROSCOPIC   EYE SURGERY     FRACTURE SURGERY     HARDWARE REMOVAL Right  10/02/2017   Procedure: HARDWARE REMOVAL;  Surgeon: Neil Balls, MD;  Location: [facility];  Service: Orthopedics;  Laterality: Right;   INJECTION OF SILICONE OIL Left 01/04/2020   Procedure: INJECTION OF SILICONE OIL;  Surgeon: Linard Reno, MD;  Location: [facility] OR;  Service: Ophthalmology;  Laterality: Left;   MEMBRANE PEEL Left 01/04/2020   Procedure: MEMBRANE PEEL;  Surgeon: Linard Reno, MD;  Location: [facility] OR;  Service: Ophthalmology;  Laterality: Left;   ORIF ANKLE FRACTURE Right 03/20/2017   Procedure: OPEN REDUCTION INTERNAL FIXATION (ORIF) ANKLE FRACTURE;  Surgeon: Neil Balls, MD;  Location: [facility];  Service: Orthopedics;  Laterality: Right;   PARS PLANA VITRECTOMY Left 01/04/2020   Procedure: PARS PLANA VITRECTOMY WITH 25 GAUGE;  Surgeon: Linard Reno, MD;  Location: [facility] OR;  Service: Ophthalmology;  Laterality: Left;   PHOTOCOAGULATION WITH LASER Left 01/04/2020   Procedure: PHOTOCOAGULATION WITH LASER;  Surgeon: Linard Reno, MD;  Location: [facility] OR;  Service: Ophthalmology;  Laterality: Left;   TONSILLECTOMY     TONSILLECTOMY AND ADENOIDECTOMY  1950's   TUBAL LIGATION       Outpatient Encounter Medications as of 12/10/2023  Medication Sig   albuterol  (VENTOLIN  HFA) 108 (90 Base) MCG/ACT inhaler Inhale 2 puffs into the lungs every 6 (six) hours as needed for wheezing or shortness of breath.   amLODipine  (NORVASC ) 5 MG tablet Take 5 mg by mouth every evening.   anastrozole  (ARIMIDEX ) 1 MG tablet Take 1 tablet (1 mg total) by mouth daily.   apixaban  (ELIQUIS ) 2.5 MG TABS tablet Take 1 tablet (2.5 mg total) by mouth 2 (two) times daily.   aspirin EC 81 MG tablet Take 81 mg by mouth daily. Swallow whole.   atorvastatin  (LIPITOR) 20 MG tablet Take 20 mg by mouth every evening.    B Complex-Folic Acid (B COMPLEX VITAMINS, W/ FA,) CAPS Take 1 capsule by mouth daily.   cholecalciferol (VITAMIN D3) 25 MCG (1000 UNIT) tablet Take 1,000 Units by mouth  daily.   Fluticasone-Umeclidin-Vilant (TRELEGY ELLIPTA ) 100-62.5-25 MCG/ACT AEPB INHALE 1 PUFF ONCE DAILY   FLUZONE HIGH-DOSE 0.5 ML injection    GEMTESA 75 MG TABS Take 1 tablet by mouth daily.   ipratropium (ATROVENT) 0.06 % nasal spray Place 2 sprays into both nostrils daily as needed for rhinitis.   levocetirizine (XYZAL) 5 MG tablet    linaclotide  (LINZESS ) 72 MCG capsule Take 1 capsule (72 mcg total) by mouth daily as needed (constipation).   montelukast  (SINGULAIR ) 10 MG tablet Take 10 mg by mouth every evening.    Na Sulfate-K Sulfate-Mg Sulfate concentrate (SUPREP) 17.5-3.13-1.6 GM/177ML SOLN See admin instructions.   PREVIDENT  5000 BOOSTER PLUS 1.1 % PSTE Place 1 application onto teeth daily.   PREVNAR 20 0.5 ML injection    Trospium Chloride 60 MG CP24 Take 1 capsule by mouth every morning.   venlafaxine  XR (EFFEXOR -XR) 37.5 MG 24 hr capsule Take 1 capsule (37.5 mg total) by mouth daily with breakfast.   No facility-administered encounter medications on file as of 12/10/2023.  There were no vitals filed for this visit. There is no height or weight on file to calculate BMI.   ECOG PERFORMANCE STATUS: {CHL ONC ECOG PS:[id number]}  PHYSICAL EXAM GENERAL:alert, no distress and comfortable SKIN: no rash  EYES: sclera clear NECK: without mass LYMPH:  no palpable cervical or supraclavicular lymphadenopathy  LUNGS: clear with normal breathing effort HEART: regular rate & rhythm, no lower extremity edema ABDOMEN: abdomen soft, non-tender and normal bowel sounds NEURO: alert & oriented x 3 with fluent speech, no focal motor/sensory deficits Breast exam:  PAC without erythema    CBC    Latest Ref Rng & Units 10/01/2023   10:52 AM 08/11/2023    1:23 PM 06/11/2023   11:18 AM  CBC  WBC 4.0 - 10.5 K/uL 7.9  18.4  7.6   Hemoglobin 12.0 - 15.0 g/dL 44.0  10.2  72.5   Hematocrit 36.0 - 46.0 % 40.2  41.7  41.0   Platelets 150 - 400 K/uL 229  232  244       CMP     Latest Ref  Rng & Units 10/01/2023   10:52 AM 08/11/2023    1:23 PM 06/11/2023   11:18 AM  CMP  Glucose 70 - 99 mg/dL 366  440  347   BUN 8 - 23 mg/dL 21  24  14    Creatinine 0.44 - 1.00 mg/dL 4.25  9.56  3.87   Sodium 135 - 145 mmol/L 142  138  141   Potassium 3.5 - 5.1 mmol/L 4.3  4.3  4.9   Chloride 98 - 111 mmol/L 108  104  106   CO2 22 - 32 mmol/L 28  26  30    Calcium  8.9 - 10.3 mg/dL 9.5  8.9  9.4   Total Protein 6.5 - 8.1 g/dL 7.1   6.9   Total Bilirubin 0.0 - 1.2 mg/dL 0.5   0.6   Alkaline Phos 38 - 126 U/L 45   46   AST 15 - 41 U/L 17   15   ALT 0 - 44 U/L 9   9       ASSESSMENT & PLAN:[name] is a 78 y.o. post-hysterectomy female with    1. Malignant neoplasm of upper-outer quadrant of left breast, stage IA pT1c, cN0, grade 1 -Diagnosed 08/2020 s/p left lumpectomy 10/21/20 by Dr. 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 Posterior margin involved by DCIS. -S/p adjuvant radiation 12/05/20 - 12/30/20 under Dr. Jeryl Moris. -she started tamoxifen  on 01/30/21, goal 5 years, switched to AI 10/2023 after she developed PE   2. Hot flashes -Secondary to Tamoxifen . -Hepatology discouraged Veozah due to cirrhosis -I reviewed effexor  vs gabapentin, she does not want something that will make her sleepy.  -She ultimately started effexor  and it is helpful   3. Cirrhosis, Cologuard + -Managed by [facility] Erlinda Haws at [facility], with ABD US 's and AFP screenings (levels 17, 11 x2) -Found to have +cologuard, established with Dr. General Kenner and planning colonoscopy 06/21/23   4. Current Smoker, Lung Cancer Screening -Patient enjoys smoking, not strongly motivated to quit but has tried Chantix in the past, did not tolerate  -She has been screened for lung cancer since 2019. last low-dose screening CT on 01/26/21 was negative. -Lung cancer screening CT 01/29/2022 showed benign appearance/behavior, and possible ILD -Continue lung cancer surveillance, CT chest 02/07/23 was benign -Encouraged smoking reduction/cessation.   -F/up pulm    5.  Health maintenance -Experienced bruising with aspirin so she  stopped it a couple months ago. OK to try every other day      PLAN:  No orders of the defined types were placed in this encounter.     All questions were answered. The patient knows to call the clinic with any problems, questions or concerns. No barriers to learning were detected. I spent *** counseling the patient face to face. The total time spent in the appointment was *** and more than 50% was on counseling, review of test results, and coordination of care.   Helina Hullum K Kue Fox, NP 12/09/2023 5:02 PM

## 2023-12-10 ENCOUNTER — Inpatient Hospital Stay (HOSPITAL_BASED_OUTPATIENT_CLINIC_OR_DEPARTMENT_OTHER): Payer: Medicare Other | Admitting: Nurse Practitioner

## 2023-12-10 ENCOUNTER — Inpatient Hospital Stay

## 2023-12-10 ENCOUNTER — Inpatient Hospital Stay: Payer: Medicare Other | Attending: Hematology

## 2023-12-10 ENCOUNTER — Encounter: Payer: Self-pay | Admitting: Nurse Practitioner

## 2023-12-10 VITALS — BP 140/92 | HR 94 | Temp 98.0°F | Resp 15 | Ht 60.0 in | Wt 145.0 lb

## 2023-12-10 DIAGNOSIS — C50412 Malignant neoplasm of upper-outer quadrant of left female breast: Secondary | ICD-10-CM

## 2023-12-10 DIAGNOSIS — Z170 Estrogen receptor positive status [ER+]: Secondary | ICD-10-CM | POA: Insufficient documentation

## 2023-12-10 DIAGNOSIS — M8580 Other specified disorders of bone density and structure, unspecified site: Secondary | ICD-10-CM

## 2023-12-10 DIAGNOSIS — Z79811 Long term (current) use of aromatase inhibitors: Secondary | ICD-10-CM | POA: Diagnosis not present

## 2023-12-10 LAB — CMP (CANCER CENTER ONLY)
ALT: 16 U/L (ref 0–44)
AST: 26 U/L (ref 15–41)
Albumin: 4.2 g/dL (ref 3.5–5.0)
Alkaline Phosphatase: 57 U/L (ref 38–126)
Anion gap: 7 (ref 5–15)
BUN: 19 mg/dL (ref 8–23)
CO2: 25 mmol/L (ref 22–32)
Calcium: 9.6 mg/dL (ref 8.9–10.3)
Chloride: 108 mmol/L (ref 98–111)
Creatinine: 1 mg/dL (ref 0.44–1.00)
GFR, Estimated: 58 mL/min — ABNORMAL LOW (ref >60)
Glucose, Bld: 116 mg/dL — ABNORMAL HIGH (ref 70–99)
Potassium: 4.4 mmol/L (ref 3.5–5.1)
Sodium: 140 mmol/L (ref 135–145)
Total Bilirubin: 0.5 mg/dL (ref 0.0–1.2)
Total Protein: 7.1 g/dL (ref 6.5–8.1)

## 2023-12-10 LAB — CBC WITH DIFFERENTIAL (CANCER CENTER ONLY)
Abs Immature Granulocytes: 0.01 10*3/uL (ref 0.00–0.07)
Basophils Absolute: 0 10*3/uL (ref 0.0–0.1)
Basophils Relative: 0 %
Eosinophils Absolute: 0.2 10*3/uL (ref 0.0–0.5)
Eosinophils Relative: 3 %
HCT: 37.6 % (ref 36.0–46.0)
Hemoglobin: 13.1 g/dL (ref 12.0–15.0)
Immature Granulocytes: 0 %
Lymphocytes Relative: 34 %
Lymphs Abs: 1.7 10*3/uL (ref 0.7–4.0)
MCH: 31.1 pg (ref 26.0–34.0)
MCHC: 34.8 g/dL (ref 30.0–36.0)
MCV: 89.3 fL (ref 80.0–100.0)
Monocytes Absolute: 0.7 10*3/uL (ref 0.1–1.0)
Monocytes Relative: 13 %
Neutro Abs: 2.5 10*3/uL (ref 1.7–7.7)
Neutrophils Relative %: 50 %
Platelet Count: 226 10*3/uL (ref 150–400)
RBC: 4.21 MIL/uL (ref 3.87–5.11)
RDW: 12.6 % (ref 11.5–15.5)
WBC Count: 5 10*3/uL (ref 4.0–10.5)
nRBC: 0 % (ref 0.0–0.2)

## 2023-12-10 MED ORDER — ANASTROZOLE 1 MG PO TABS: 1.0000 mg | ORAL_TABLET | Freq: Every day | ORAL | 1 refills | Status: AC

## 2023-12-10 MED ORDER — SODIUM CHLORIDE 0.9 % IV SOLN: INTRAVENOUS | Status: DC

## 2023-12-10 MED ORDER — VENLAFAXINE HCL ER 75 MG PO CP24: 75.0000 mg | ORAL_CAPSULE | Freq: Every day | ORAL | 0 refills | Status: DC

## 2023-12-10 MED ORDER — ZOLEDRONIC ACID 4 MG/100ML IV SOLN
4.0000 mg | Freq: Once | INTRAVENOUS | Status: AC
  Administered 2023-12-10: 4 mg via INTRAVENOUS
  Filled 2023-12-10: qty 100

## 2023-12-10 NOTE — Patient Instructions (Signed)
 Zoledronic Acid Injection  What is this medication? ZOLEDRONIC ACID (ZOE le dron ik AS id) treats high calcium levels in the blood caused by cancer. It may also be used with chemotherapy to treat weakened bones caused by cancer. It works by slowing down the release of calcium from bones. This lowers calcium levels in your blood. It also makes your bones stronger and less likely to break (fracture). It belongs to a group of medications called bisphosphonates. This medicine may be used for other purposes; ask your health care provider or pharmacist if you have questions. COMMON BRAND NAME(S): Zometa, Zometa Powder What should I tell my care team before I take this medication? They need to know if you have any of these conditions: Dehydration Dental disease Kidney disease Liver disease Low levels of calcium in the blood Lung or breathing disease, such as asthma Receiving steroids, such as dexamethasone or prednisone An unusual or allergic reaction to zoledronic acid, other medications, foods, dyes, or preservatives Pregnant or trying to get pregnant Breast-feeding How should I use this medication? This medication is injected into a vein. It is given by your care team in a hospital or clinic setting. Talk to your care team about the use of this medication in children. Special care may be needed. Overdosage: If you think you have taken too much of this medicine contact a poison control center or emergency room at once. NOTE: This medicine is only for you. Do not share this medicine with others. What if I miss a dose? Keep appointments for follow-up doses. It is important not to miss your dose. Call your care team if you are unable to keep an appointment. What may interact with this medication? Certain antibiotics given by injection Diuretics, such as bumetanide, furosemide NSAIDs, medications for pain and inflammation, such as ibuprofen or naproxen Teriparatide Thalidomide This list may not  describe all possible interactions. Give your health care provider a list of all the medicines, herbs, non-prescription drugs, or dietary supplements you use. Also tell them if you smoke, drink alcohol, or use illegal drugs. Some items may interact with your medicine. What should I watch for while using this medication? Visit your care team for regular checks on your progress. It may be some time before you see the benefit from this medication. Some people who take this medication have severe bone, joint, or muscle pain. This medication may also increase your risk for jaw problems or a broken thigh bone. Tell your care team right away if you have severe pain in your jaw, bones, joints, or muscles. Tell you care team if you have any pain that does not go away or that gets worse. Tell your dentist and dental surgeon that you are taking this medication. You should not have major dental surgery while on this medication. See your dentist to have a dental exam and fix any dental problems before starting this medication. Take good care of your teeth while on this medication. Make sure you see your dentist for regular follow-up appointments. You should make sure you get enough calcium and vitamin D while you are taking this medication. Discuss the foods you eat and the vitamins you take with your care team. Check with your care team if you have severe diarrhea, nausea, and vomiting, or if you sweat a lot. The loss of too much body fluid may make it dangerous for you to take this medication. You may need bloodwork while taking this medication. Talk to your care team if  you wish to become pregnant or think you might be pregnant. This medication can cause serious birth defects. What side effects may I notice from receiving this medication? Side effects that you should report to your care team as soon as possible: Allergic reactions--skin rash, itching, hives, swelling of the face, lips, tongue, or throat Kidney  injury--decrease in the amount of urine, swelling of the ankles, hands, or feet Low calcium level--muscle pain or cramps, confusion, tingling, or numbness in the hands or feet Osteonecrosis of the jaw--pain, swelling, or redness in the mouth, numbness of the jaw, poor healing after dental work, unusual discharge from the mouth, visible bones in the mouth Severe bone, joint, or muscle pain Side effects that usually do not require medical attention (report to your care team if they continue or are bothersome): Constipation Fatigue Fever Loss of appetite Nausea Stomach pain This list may not describe all possible side effects. Call your doctor for medical advice about side effects. You may report side effects to FDA at 1-800-FDA-1088. Where should I keep my medication? This medication is given in a hospital or clinic. It will not be stored at home. NOTE: This sheet is a summary. It may not cover all possible information. If you have questions about this medicine, talk to your doctor, pharmacist, or health care provider.  2024 Elsevier/Gold Standard (2021-08-11 00:00:00)

## 2023-12-12 ENCOUNTER — Encounter: Payer: Self-pay | Admitting: Internal Medicine

## 2023-12-12 ENCOUNTER — Ambulatory Visit (INDEPENDENT_AMBULATORY_CARE_PROVIDER_SITE_OTHER): Admitting: Internal Medicine

## 2023-12-12 VITALS — BP 124/70 | HR 100 | Temp 98.6°F | Ht 60.0 in | Wt 144.6 lb

## 2023-12-12 DIAGNOSIS — F418 Other specified anxiety disorders: Secondary | ICD-10-CM

## 2023-12-12 DIAGNOSIS — I10 Essential (primary) hypertension: Secondary | ICD-10-CM | POA: Diagnosis not present

## 2023-12-12 DIAGNOSIS — N3281 Overactive bladder: Secondary | ICD-10-CM

## 2023-12-12 DIAGNOSIS — I2699 Other pulmonary embolism without acute cor pulmonale: Secondary | ICD-10-CM | POA: Diagnosis not present

## 2023-12-12 DIAGNOSIS — R7303 Prediabetes: Secondary | ICD-10-CM | POA: Diagnosis not present

## 2023-12-12 DIAGNOSIS — Z780 Asymptomatic menopausal state: Secondary | ICD-10-CM

## 2023-12-12 DIAGNOSIS — J449 Chronic obstructive pulmonary disease, unspecified: Secondary | ICD-10-CM

## 2023-12-12 DIAGNOSIS — M8580 Other specified disorders of bone density and structure, unspecified site: Secondary | ICD-10-CM

## 2023-12-12 DIAGNOSIS — E782 Mixed hyperlipidemia: Secondary | ICD-10-CM

## 2023-12-12 LAB — POCT GLYCOSYLATED HEMOGLOBIN (HGB A1C): Hemoglobin A1C: 5.7 % — AB (ref 4.0–5.6)

## 2023-12-12 NOTE — Progress Notes (Signed)
 [facility] [address] [place] [address] Dept: [phone] Dept Fax: [phone]    Subjective:   [name] 1946/06/18 12/12/2023  Chief Complaint  Patient presents with   Follow-up    HPI: [name] presents today for re-assessment and management of chronic medical conditions.  History of Present Illness   [name] is a 78 year old female who presents for routine chronic follow-up.  She is on amlodipine  5 mg daily for hypertension, with stable blood pressure control. Her prediabetes is managed without medication, and her A1c has improved to 5.7% from 6.2% at the last visit.  For COPD, she uses an albuterol  inhaler as a rescue medication and Trelegy for management. Her pulmonary embolism is managed with Eliquis , now reduced to 2.5 mg twice daily. She reports no worsening shortness of breath or chest pain.  She takes atorvastatin  20 mg daily for hyperlipidemia. Her osteopenia is monitored with regular bone density scans, and she takes a vitamin D3 supplement daily. She recently had a zoledronic acid infusion.  She has a history of breast cancer and was switched from tamoxifen  to anastrozole  by her oncology team, which increases her risk for blood clots. This is why her pulmonology team decided to keep patient on anticoag tx for next 2 years. Her overactive bladder is managed with Gemtesa. She has a history of depression and anxiety, for which she takes Effexor , recently increased to 75 mg due to hot flashes.  She  up to date on her colonoscopy, which is next due in 2027. Her mammogram is scheduled for October 2025. She is current on her Shingrix and pneumococcal vaccines.       Lab Results  Component Value Date   HGBA1C 5.7 (A) 12/12/2023        Latest Ref Rng & Units 12/10/2023   11:13 AM 10/01/2023   10:52 AM 08/11/2023    1:23 PM  CMP  Glucose 70 - 99 mg/dL 962  952  841   BUN 8 - 23  mg/dL 19  21  24    Creatinine 0.44 - 1.00 mg/dL 3.24  4.01  0.27   Sodium 135 - 145 mmol/L 140  142  138   Potassium 3.5 - 5.1 mmol/L 4.4  4.3  4.3   Chloride 98 - 111 mmol/L 108  108  104   CO2 22 - 32 mmol/L 25  28  26    Calcium  8.9 - 10.3 mg/dL 9.6  9.5  8.9   Total Protein 6.5 - 8.1 g/dL 7.1  7.1    Total Bilirubin 0.0 - 1.2 mg/dL 0.5  0.5    Alkaline Phos 38 - 126 U/L 57  45    AST 15 - 41 U/L 26  17    ALT 0 - 44 U/L 16  9     CBC    Component Value Date/Time   WBC 5.0 12/10/2023 1113   WBC 18.4 (H) 08/11/2023 1323   RBC 4.21 12/10/2023 1113   HGB 13.1 12/10/2023 1113   HCT 37.6 12/10/2023 1113   PLT 226 12/10/2023 1113   MCV 89.3 12/10/2023 1113   MCH 31.1 12/10/2023 1113   MCHC 34.8 12/10/2023 1113   RDW 12.6 12/10/2023 1113   LYMPHSABS 1.7 12/10/2023 1113   MONOABS 0.7 12/10/2023 1113   EOSABS 0.2 12/10/2023 1113   BASOSABS 0.0 12/10/2023 1113    The following portions of the patient's history were reviewed and updated  as appropriate: past medical history, past surgical history, family history, social history, allergies, medications, and problem list.   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   History of breast cancer 07/11/2023   Prediabetes 03/10/2021   Overactive bladder 11/02/2019   Aortic atherosclerosis (HCC) 03/24/2019   Localized osteoarthritis of right ankle 10/02/2017   Retained orthopedic hardware 10/02/2017   Urge incontinence of urine 07/24/2016   Fibrosis of liver 02/01/2015   Spondylosis of cervical region without myelopathy or radiculopathy 11/02/2014   Depression with anxiety 12/25/2011   HTN (hypertension) 12/25/2011   GERD (gastroesophageal reflux disease) 12/25/2011   COPD, moderate (HCC) 12/25/2011    Chronic constipation 12/25/2011   Seasonal allergic rhinitis 10/02/2011   Osteoarthritis, knee 11/16/2010   Past Medical History:  Diagnosis Date   Allergy    Asthma    Atypical chest pain 05/29/2011   Normal cardiology eval: ECHO and stress testing 2012.  Cardiac cath: normal 2014 for worsening dyspnea     Bimalleolar fracture of right ankle 03/20/2017   Breast cancer (HCC)    Bulging lumbar disc    L4 OR L5 TAKES STEROID INJECTIONS FOR TOOK INJECTION MARCH 2019   Chronic bronchitis (HCC) FEW YEARS AGO   Chronic hepatitis C (HCC)    TOOK HARVONI TX 2017    Colon polyp    COPD (chronic obstructive pulmonary disease) (HCC)    Diverticulitis 31YRS AGO   Emphysema of lung (HCC)    TOOK PULMONARY FUCTION TEST AND PASSED FEW WEEKS AGO AT [facility]ILY  MD   Gallstones    GERD (gastroesophageal reflux disease)    History of kidney stones 20 YRS AGO    Hormone replacement therapy (postmenopausal) 08/22/2012   Hyperlipidemia    Hypertension    Malignant neoplasm of upper-outer quadrant of left breast in female, 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 Theron Flavin     Past Surgical History:  Procedure Laterality Date   ABDOMINAL HYSTERECTOMY  1980   PARTIAL   ANKLE ARTHROSCOPY Right 10/02/2017   Procedure: RIGHT ANKLE ARTHROSCOPY WITH EXTENSIVE DEBRIDEMENT;  Surgeon: Neil Balls, MD;  Location: [facility] [place];  Service: Orthopedics;  Laterality: Right;  90 MINUTES FOR CASE   BREAST BIOPSY  02/07/2010   BREAST LUMPECTOMY WITH RADIOACTIVE SEED LOCALIZATION Left 10/21/2020   Procedure: LEFT BREAST LUMPECTOMY WITH RADIOACTIVE SEED LOCALIZATION;  Surgeon: Oza Blumenthal, MD;  Location: [facility] OR;  Service: General;  Laterality: Left;  60 MINUTES ROOM 2   BREAST SURGERY  2011   biopsy    CATARACT EXTRACTION     CHOLECYSTECTOMY  1999   LAPAROSCOPIC   EYE SURGERY     FRACTURE SURGERY     HARDWARE REMOVAL Right 10/02/2017   Procedure: HARDWARE REMOVAL;  Surgeon: Neil Balls, MD;  Location: [facility] [place];  Service: Orthopedics;  Laterality: Right;   INJECTION OF SILICONE OIL Left 01/04/2020   Procedure: INJECTION OF SILICONE OIL;  Surgeon: Linard Reno, MD;  Location: [facility] OR;  Service: Ophthalmology;  Laterality: Left;   MEMBRANE PEEL Left 01/04/2020   Procedure: MEMBRANE  PEEL;  Surgeon: Linard Reno, MD;  Location: [facility] OR;  Service: Ophthalmology;  Laterality: Left;   ORIF ANKLE FRACTURE Right 03/20/2017   Procedure: OPEN REDUCTION INTERNAL FIXATION (ORIF) ANKLE FRACTURE;  Surgeon: Neil Balls, MD;  Location: [facility];  Service: Orthopedics;  Laterality: Right;   PARS PLANA VITRECTOMY Left 01/04/2020   Procedure: PARS PLANA VITRECTOMY WITH 25 GAUGE;  Surgeon: Linard Reno, MD;  Location: [facility] OR;  Service: Ophthalmology;  Laterality: Left;   PHOTOCOAGULATION WITH LASER Left 01/04/2020   Procedure: PHOTOCOAGULATION WITH LASER;  Surgeon: Linard Reno, MD;  Location: [facility] OR;  Service: Ophthalmology;  Laterality: Left;   TONSILLECTOMY     TONSILLECTOMY AND ADENOIDECTOMY  1950's   TUBAL LIGATION     Family History  Problem Relation Age of Onset   Heart disease Mother    Stroke Brother    Stroke Maternal Uncle    COPD Paternal Uncle    Colon cancer Neg Hx    Stomach cancer Neg Hx    Esophageal cancer Neg Hx     Current Outpatient Medications:    albuterol  (VENTOLIN  HFA) 108 (90 Base) MCG/ACT inhaler, Inhale 2 puffs into the lungs every 6 (six) hours as needed for wheezing or shortness of breath., Disp: 18 g, Rfl: 0   amLODipine  (NORVASC ) 5 MG tablet, Take 5 mg by mouth every evening., Disp: , Rfl:    anastrozole  (ARIMIDEX ) 1 MG tablet, Take 1 tablet (1 mg total) by mouth daily., Disp: 90 tablet, Rfl: 1   apixaban  (ELIQUIS ) 2.5 MG TABS  tablet, Take 1 tablet (2.5 mg total) by mouth 2 (two) times daily., Disp: 60 tablet, Rfl: 11   atorvastatin  (LIPITOR) 20 MG tablet, Take 20 mg by mouth every evening. , Disp: , Rfl:    B Complex-Folic Acid (B COMPLEX VITAMINS, W/ FA,) CAPS, Take 1 capsule by mouth daily., Disp: , Rfl:    cholecalciferol (VITAMIN D3) 25 MCG (1000 UNIT) tablet, Take 1,000 Units by mouth daily., Disp: , Rfl:    Fluticasone-Umeclidin-Vilant (TRELEGY ELLIPTA ) 100-62.5-25 MCG/ACT AEPB, INHALE 1 PUFF ONCE DAILY, Disp: 60 each, Rfl: 11   ipratropium (ATROVENT) 0.06 % nasal spray, Place 2 sprays into both nostrils daily as needed for rhinitis., Disp: , Rfl:    levocetirizine (XYZAL) 5 MG tablet, , Disp: , Rfl:    linaclotide  (LINZESS ) 72 MCG capsule, Take 1 capsule (72 mcg total) by mouth daily as needed (constipation)., Disp: 30 capsule, Rfl: 2   montelukast  (SINGULAIR ) 10 MG tablet, Take 10 mg by mouth every evening. , Disp: , Rfl:    Na Sulfate-K Sulfate-Mg Sulfate concentrate (SUPREP) 17.5-3.13-1.6 GM/177ML SOLN, See admin instructions., Disp: , Rfl:    PREVIDENT  5000 BOOSTER PLUS 1.1 % PSTE, Place 1 application onto teeth daily., Disp: , Rfl:    PREVNAR 20 0.5 ML injection, , Disp: , Rfl:    Trospium Chloride 60 MG CP24, Take 1 capsule by mouth every morning., Disp: , Rfl:    venlafaxine  XR (EFFEXOR -XR) 75 MG 24 hr capsule, Take 1 capsule (75 mg total) by mouth daily with breakfast., Disp: 90 capsule, Rfl: 0   aspirin EC 81 MG tablet, Take 81 mg by mouth daily. Swallow whole. (Patient not taking: Reported on 12/12/2023), Disp: , Rfl:    FLUZONE HIGH-DOSE 0.5 ML injection, , Disp: , Rfl:    GEMTESA 75 MG TABS, Take 1 tablet by mouth daily., Disp: , Rfl:  Allergies  Allergen Reactions   Chantix [Varenicline] Other (See Comments)  Makes her feel crazy   Codeine Itching     ROS: A complete ROS was performed with pertinent positives/negatives noted in the HPI. The remainder of the ROS are negative.    Objective:    Today's Vitals   12/12/23 1016  BP: 124/70  Pulse: 100  Temp: 98.6 F (37 C)  TempSrc: Temporal  SpO2: 97%  Weight: 144 lb 9.6 oz (65.6 kg)  Height: 5' (1.524 m)    GENERAL: Well-appearing, in NAD. Well nourished.  SKIN: Pink, warm and dry. NECK: Trachea midline. Full ROM w/o pain or tenderness. No lymphadenopathy.  RESPIRATORY: Chest wall symmetrical. Respirations even and non-labored. Breath sounds clear to auscultation bilaterally.  CARDIAC: S1, S2 present, regular rate and rhythm. Peripheral pulses 2+ bilaterally.  EXTREMITIES: Without clubbing, cyanosis, or edema.  NEUROLOGIC: No motor or sensory deficits. Steady, even gait.  PSYCH/MENTAL STATUS: Alert, oriented x 3. Cooperative, appropriate mood and affect.   There are no preventive care reminders to display for this patient.   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 - Continue Eliquis  2.5 mg PO twice daily for two years.  Chronic Obstructive Pulmonary Disease (COPD) COPD managed with albuterol  and Trelegy, no exacerbations reported. - Continue albuterol  inhaler as needed. - Continue Trelegy as prescribed.  Hypertension Hypertension well-controlled with amlodipine . - Continue amlodipine  5 mg PO daily.  Hyperlipidemia Hyperlipidemia stable with atorvastatin . - Continue atorvastatin  20 mg PO daily.  Prediabetes A1c improved to 5.7%, indicating good control without medication.  Osteopenia Osteopenia managed with vitamin D3 and  zoledronic acid. - Continue vitamin D3 supplementation daily. - Continue zoledronic acid infusions as scheduled. - UTD on DEXA scan  Overactive Bladder Overactive bladder well-managed with Gemtesa. - Continue Gemtesa as prescribed.  Depression and Anxiety Depression and anxiety managed with Effexor , symptoms well-controlled. - Continue Effexor  75 mg as prescribed.   General Health Maintenance Up to date on colonoscopy, Shingrix, and pneumococcal vaccines. Mammogram scheduled. - Ensure mammogram is completed in October 2025.  Follow-up Routine follow-up planned for monitoring. - Follow up in 4 months.       Orders Placed This Encounter  Procedures   POCT glycosylated hemoglobin (Hb A1C)   No images are attached to the encounter or orders placed in the encounter. No orders of the defined types were placed in this encounter.   Return in about 4 months (around 04/12/2024) for Chronic Condition follow up.   Gavin Kast, FNP

## 2023-12-18 ENCOUNTER — Other Ambulatory Visit: Payer: Self-pay | Admitting: Nurse Practitioner

## 2023-12-18 DIAGNOSIS — F17210 Nicotine dependence, cigarettes, uncomplicated: Secondary | ICD-10-CM

## 2023-12-18 DIAGNOSIS — J449 Chronic obstructive pulmonary disease, unspecified: Secondary | ICD-10-CM

## 2023-12-25 ENCOUNTER — Other Ambulatory Visit: Payer: Self-pay | Admitting: Nurse Practitioner

## 2023-12-25 DIAGNOSIS — C50412 Malignant neoplasm of upper-outer quadrant of left female breast: Secondary | ICD-10-CM

## 2023-12-26 ENCOUNTER — Other Ambulatory Visit: Payer: Self-pay | Admitting: Internal Medicine

## 2023-12-26 DIAGNOSIS — K5909 Other constipation: Secondary | ICD-10-CM

## 2024-01-08 ENCOUNTER — Ambulatory Visit: Payer: Medicare Other | Admitting: Internal Medicine

## 2024-01-13 ENCOUNTER — Telehealth: Payer: Self-pay | Admitting: Gastroenterology

## 2024-01-13 NOTE — Telephone Encounter (Signed)
 Pt scheduled to see Alan Coombs PA 01/22/24@8 :40am. Pt aware of appt.

## 2024-01-13 NOTE — Telephone Encounter (Signed)
 Pt states Dr Leigh gave her linzess  and she only takes it occasionally. Report for a while now when she goes to poop she has cramping and then she throws up mostly undigested food that she just ate. She reports she had the sensation last week but was able to hold off. Yesterday she had eaten squash, corn and cabbage. She went to poop and threw up also. Pt states she is getting frustrated with these symptoms and wants to know what Dr. Leigh recommends. Please advise.

## 2024-01-13 NOTE — Telephone Encounter (Signed)
 Requesting f/u call in regards to which gastro provider she needs to attend. Please advise.

## 2024-01-21 NOTE — Progress Notes (Unsigned)
 01/22/2024 [name] [id number] [birth year]  Referring provider: Billy Knee, FNP Primary GI doctor: Dr. Leigh  ASSESSMENT AND PLAN:  Vomiting during bowel movements x 1 year with history of constipation On Linzess  72 mcg as needed every 3-4 days worse in the past month, 1-3 x a month No GERD, nausea, vomiting between episodes, no dysphagia, melena, no AB pain Chronic constipation with incomplete bowel movements and post-defecation vomiting, suggestive of pelvic floor dysfunction.  Less likely obstruction or gastroparesis with no symptoms between episodes Concerns about hypersensitivity reaction due to intestinal stretching. - Order abdominal x-ray to assess bowel status. - Check labs to rule out complications related to cirrhosis. - Provide information on constipation management and pelvic floor dysfunction. - Prescribe antispasmodic medication, levsin , for as-needed use during episodes of vomiting. - Consider bowel purge and daily Linzess  with fiber pending x-ray results. - consider cross sectional imaging - close follow up 2 months  Cirrhosis secondary to hepatitis C, follows with [facility] gastroenterology Last seen 10/22/2023 12/10/2023 Platelets 226 12/10/2023 AST 26 ALT 16 Alkphos 57 TBili 0.5 Recheck labs to calculate MELD No ascites on exam, reminded about low sodium diet, no astrexis Appears to be compensated at this time No evidence of portal HTN, if any thrombocytopenia or evidence suggest EGD    personal history of tubular adenomatous polyps 06/21/2023 colonoscopy for positive Cologuard adequate preparation 3 polyps 3 to 4 mm, 2 polyps 3 to 4 mm, one 8 mm polyp, 3 mm polyp, torturous colon, tics transverse left colon, internal hemorrhoids.  All polyps were TA recall 3 years.  Pelvic floor dysfunction Suspected pelvic floor dysfunction contributing to incomplete bowel movements and constipation. - Provide information on pelvic floor dysfunction. -  Discuss potential benefits of pelvic floor physical therapy.  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 Anticoagulation to continue for two and a half more years due to concurrent antiestrogen therapy for breast cancer.  History of left breast cancer  status postlumpectomy and radiation 2022  COPD Advised to stop smoking  Patient Care Team: Billy Knee, FNP as PCP - General (Internal Medicine) Glean Stephane BROCKS, RN (Inactive) as Oncology Nurse Navigator Tyree Nanetta SAILOR, RN as Oncology Nurse Navigator Vernetta Berg, MD as Consulting Physician (General Surgery) Dewey Rush, MD as Consulting Physician (Radiation Oncology) Jarold Mayo, MD as Consulting Physician (Ophthalmology) March Alyce SAUNDERS, MD as Referring Physician (Gastroenterology) Darol Norris, MD as Referring Physician (Obstetrics and Gynecology) Lynnell Nottingham, MD as Consulting Physician (Dermatology) Lanny Callander, MD as Attending Physician (Hematology and Oncology) Tat, Asberry RAMAN, DO as Consulting Physician (Neurology)  HISTORY OF PRESENT ILLNESS: 78 y.o. female with medical history significant for hepatitis C related cirrhosis, COPD, history of breast cancer, history of positive Cologuard presents with vomiting.  Patient follows with Dr. [facility] hepatology for cirrhosis, last saw him 10/22/2023.  Last seen in the office 05/20/2023 by Dr. Leigh for positive cologuard.   Discussed the use of AI scribe software for clinical note transcription with the patient, who gave verbal consent to proceed.  History of Present Illness   [name] is a 78 year old female with constipation and cirrhosis who presents with episodes of vomiting associated with bowel movements. She is accompanied by a friend.  She experiences episodes of vomiting that occur after bowel movements. Initially, she has a normal bowel movement but feels incomplete evacuation. She then has  difficulty passing stool, becomes hot and nauseated, and eventually vomits while passing  the final stool. This has been occurring intermittently for about a year, with increased frequency in the past month, happening one to two times a month. Vomitus is primarily food, not bile or blood.  She has a history of constipation, which she manages with Linzess  72 mcg, taken as needed every three to four days to avoid diarrhea. She reports a long-standing issue with constipation since her thirties. She also mentions a recent episode of black stool, which she attributes to consuming chocolate. Occasional bloating and lower abdominal discomfort occur before bowel movements. She has experienced a 20-pound weight loss over the past year but denies early satiety.  Her past medical history includes cirrhosis, for which she follows a low sodium diet, and a history of pulmonary embolism post-COVID, for which she is on Eliquis . She also has a history of breast cancer and is on Arimidex . No current urinary incontinence or frequency, which was previously managed with medication.  She had a colonoscopy in December 2024, which revealed a tortuous colon and multiple polyps. She also had a positive Cologuard test. She follows with [facility] Hepatology for her liver issues.       Social history:  She  reports that she has been smoking cigarettes. She started smoking about 63 years ago. She has a 63.5 pack-year smoking history. She has never used smokeless tobacco. She reports that she does not currently use alcohol. She reports that she does not use drugs.   RELEVANT GI HISTORY, LABS, IMAGING:  CBC    Component Value Date/Time   WBC 5.0 12/10/2023 1113   WBC 18.4 (H) 08/11/2023 1323   RBC 4.21 12/10/2023 1113   HGB 13.1 12/10/2023 1113   HCT 37.6 12/10/2023 1113   PLT 226 12/10/2023 1113   MCV 89.3 12/10/2023 1113   MCH 31.1 12/10/2023 1113   MCHC 34.8 12/10/2023 1113   RDW 12.6 12/10/2023 1113   LYMPHSABS  1.7 12/10/2023 1113   MONOABS 0.7 12/10/2023 1113   EOSABS 0.2 12/10/2023 1113   BASOSABS 0.0 12/10/2023 1113   Recent Labs    06/11/23 1118 08/11/23 1323 10/01/23 1052 12/10/23 1113  HGB 13.8 14.1 13.6 13.1    CMP     Component Value Date/Time   NA 140 12/10/2023 1113   K 4.4 12/10/2023 1113   CL 108 12/10/2023 1113   CO2 25 12/10/2023 1113   GLUCOSE 116 (H) 12/10/2023 1113   BUN 19 12/10/2023 1113   CREATININE 1.00 12/10/2023 1113   CALCIUM  9.6 12/10/2023 1113   PROT 7.1 12/10/2023 1113   ALBUMIN 4.2 12/10/2023 1113   AST 26 12/10/2023 1113   ALT 16 12/10/2023 1113   ALKPHOS 57 12/10/2023 1113   BILITOT 0.5 12/10/2023 1113   GFRNONAA 58 (L) 12/10/2023 1113      Latest Ref Rng & Units 12/10/2023   11:13 AM 10/01/2023   10:52 AM 06/11/2023   11:18 AM  Hepatic Function  Total Protein 6.5 - 8.1 g/dL 7.1  7.1  6.9   Albumin 3.5 - 5.0 g/dL 4.2  4.2  4.0   AST 15 - 41 U/L 26  17  15    ALT 0 - 44 U/L 16  9  9    Alk Phosphatase 38 - 126 U/L 57  45  46   Total Bilirubin 0.0 - 1.2 mg/dL 0.5  0.5  0.6       Current Medications:    Current Outpatient Medications (Cardiovascular):    amLODipine  (NORVASC ) 5 MG tablet, Take  5 mg by mouth every evening.   atorvastatin  (LIPITOR) 20 MG tablet, Take 20 mg by mouth every evening.   Current Outpatient Medications (Respiratory):    albuterol  (VENTOLIN  HFA) 108 (90 Base) MCG/ACT inhaler, Inhale 2 puffs into the lungs every 6 (six) hours as needed for wheezing or shortness of breath.   Fluticasone-Umeclidin-Vilant (TRELEGY ELLIPTA ) 100-62.5-25 MCG/ACT AEPB, INHALE 1 PUFF ONCE DAILY   ipratropium (ATROVENT) 0.06 % nasal spray, Place 2 sprays into both nostrils daily as needed for rhinitis.   levocetirizine (XYZAL) 5 MG tablet,    montelukast  (SINGULAIR ) 10 MG tablet, Take 10 mg by mouth every evening.   Current Outpatient Medications (Analgesics):    aspirin EC 81 MG tablet, Take 81 mg by mouth daily. Swallow whole.  Current  Outpatient Medications (Hematological):    apixaban  (ELIQUIS ) 2.5 MG TABS tablet, Take 1 tablet (2.5 mg total) by mouth 2 (two) times daily.  Current Outpatient Medications (Other):    anastrozole  (ARIMIDEX ) 1 MG tablet, Take 1 tablet (1 mg total) by mouth daily.   B Complex-Folic Acid (B COMPLEX VITAMINS, W/ FA,) CAPS, Take 1 capsule by mouth daily.   cholecalciferol (VITAMIN D3) 25 MCG (1000 UNIT) tablet, Take 1,000 Units by mouth daily.   FLUZONE HIGH-DOSE 0.5 ML injection,    GEMTESA 75 MG TABS, Take 1 tablet by mouth daily.   hyoscyamine  (LEVSIN ) 0.125 MG tablet, Take 1 tablet (0.125 mg total) by mouth every 6 (six) hours as needed for cramping.   LINZESS  72 MCG capsule, TAKE 1 CAPSULE BY MOUTH ONCE DAILY AS NEEDED FOR  CONSTIPATION   PREVIDENT  5000 BOOSTER PLUS 1.1 % PSTE, Place 1 application onto teeth daily.   PREVNAR 20 0.5 ML injection,    Trospium Chloride 60 MG CP24, Take 1 capsule by mouth every morning.   venlafaxine  XR (EFFEXOR -XR) 75 MG 24 hr capsule, Take 1 capsule (75 mg total) by mouth daily with breakfast.  Medical History:  Past Medical History:  Diagnosis Date   Allergy    Asthma    Atypical chest pain 05/29/2011   Normal cardiology eval: ECHO and stress testing 2012.  Cardiac cath: normal 2014 for worsening dyspnea     Bimalleolar fracture of right ankle 03/20/2017   Breast cancer (HCC)    Bulging lumbar disc    L4 OR L5 TAKES STEROID INJECTIONS FOR TOOK INJECTION MARCH 2019   Chronic bronchitis (HCC) FEW YEARS AGO   Chronic hepatitis C (HCC)    TOOK HARVONI TX 2017    Colon polyp    COPD (chronic obstructive pulmonary disease) (HCC)    Diverticulitis 84YRS AGO   Emphysema of lung (HCC)    TOOK PULMONARY FUCTION TEST AND PASSED FEW WEEKS AGO AT FAMILY  MD   Gallstones    GERD (gastroesophageal reflux disease)    History of kidney stones 20 YRS AGO    Hormone replacement therapy (postmenopausal) 08/22/2012   Hyperlipidemia    Hypertension    Malignant  neoplasm of upper-outer quadrant of left breast in female, estrogen receptor positive (HCC) 09/30/2020   Osteoarthritis    Pneumonia AT BIRTH   Spondylosis of cervical region without myelopathy or radiculopathy    Squamous cell carcinoma of skin 06/15/2013   Arms and leg; [facility] Dermatology, Dr. Lynnell     Allergies:  Allergies  Allergen Reactions   Chantix [Varenicline] Other (See Comments)    Makes her feel crazy   Codeine Itching     Surgical History:  She  has a past surgical history that includes Tonsillectomy and adenoidectomy (1950's); ORIF ankle fracture (Right, 03/20/2017); Abdominal hysterectomy (1980); Breast surgery (2011); Breast biopsy (02/07/2010); Cholecystectomy (1999); Ankle arthroscopy (Right, 10/02/2017); Hardware Removal (Right, 10/02/2017); Tonsillectomy; Pars plana vitrectomy (Left, 01/04/2020); Membrane peel (Left, 01/04/2020); Injection of silicone oil (Left, 01/04/2020); Photocoagulation with laser (Left, 01/04/2020); Breast lumpectomy with radioactive seed localization (Left, 10/21/2020); Cataract extraction; Eye surgery; Fracture surgery; and Tubal ligation. Family History:  Her family history includes COPD in her paternal uncle; Heart disease in her mother; Stroke in her brother and maternal uncle.  REVIEW OF SYSTEMS  : All other systems reviewed and negative except where noted in the History of Present Illness.  PHYSICAL EXAM: BP (!) 142/80   Pulse 100   Ht 5' (1.524 m)   Wt 144 lb (65.3 kg)   BMI 28.12 kg/m  Physical Exam   GENERAL APPEARANCE: Well nourished, in no apparent distress. HEENT: No cervical lymphadenopathy, unremarkable thyroid , sclerae anicteric, conjunctiva pink. RESPIRATORY: Respiratory effort normal, breath sounds equal bilaterally without rales, rhonchi, or wheezing. CARDIO: Regular rate and rhythm with no murmurs, rubs, or gallops, peripheral pulses intact. ABDOMEN: Soft, non-distended, active bowel sounds in all four  quadrants, non-tender to palpation, no rebound, no mass appreciated. RECTAL: Declines. MUSCULOSKELETAL: Full range of motion, normal gait, without edema. SKIN: Dry, intact without rashes or lesions. No jaundice. NEURO: Alert, oriented, no focal deficits, normal proprioception. PSYCH: Cooperative, normal mood and affect.        Alan JONELLE Coombs, PA-C 10:39 AM

## 2024-01-22 ENCOUNTER — Other Ambulatory Visit (INDEPENDENT_AMBULATORY_CARE_PROVIDER_SITE_OTHER)

## 2024-01-22 ENCOUNTER — Ambulatory Visit: Payer: Self-pay | Admitting: Physician Assistant

## 2024-01-22 ENCOUNTER — Telehealth: Payer: Self-pay

## 2024-01-22 ENCOUNTER — Ambulatory Visit (INDEPENDENT_AMBULATORY_CARE_PROVIDER_SITE_OTHER)
Admission: RE | Admit: 2024-01-22 | Discharge: 2024-01-22 | Disposition: A | Discharge status: Home / Self Care | Source: Ambulatory Visit | Attending: Physician Assistant | Admitting: Physician Assistant

## 2024-01-22 ENCOUNTER — Ambulatory Visit: Admitting: Physician Assistant

## 2024-01-22 VITALS — BP 142/80 | HR 100 | Ht 60.0 in | Wt 144.0 lb

## 2024-01-22 DIAGNOSIS — K5909 Other constipation: Secondary | ICD-10-CM

## 2024-01-22 DIAGNOSIS — R1111 Vomiting without nausea: Secondary | ICD-10-CM

## 2024-01-22 DIAGNOSIS — K7469 Other cirrhosis of liver: Secondary | ICD-10-CM

## 2024-01-22 DIAGNOSIS — I2699 Other pulmonary embolism without acute cor pulmonale: Secondary | ICD-10-CM

## 2024-01-22 DIAGNOSIS — K219 Gastro-esophageal reflux disease without esophagitis: Secondary | ICD-10-CM

## 2024-01-22 DIAGNOSIS — N3281 Overactive bladder: Secondary | ICD-10-CM

## 2024-01-22 DIAGNOSIS — J449 Chronic obstructive pulmonary disease, unspecified: Secondary | ICD-10-CM

## 2024-01-22 LAB — CBC WITH DIFFERENTIAL/PLATELET
Basophils Absolute: 0.1 K/uL (ref 0.0–0.1)
Basophils Relative: 0.7 % (ref 0.0–3.0)
Eosinophils Absolute: 0.2 K/uL (ref 0.0–0.7)
Eosinophils Relative: 3.1 % (ref 0.0–5.0)
HCT: 40.5 % (ref 36.0–46.0)
Hemoglobin: 13.4 g/dL (ref 12.0–15.0)
Lymphocytes Relative: 27.1 % (ref 12.0–46.0)
Lymphs Abs: 1.9 K/uL (ref 0.7–4.0)
MCHC: 33.1 g/dL (ref 30.0–36.0)
MCV: 92.1 fl (ref 78.0–100.0)
Monocytes Absolute: 0.6 K/uL (ref 0.1–1.0)
Monocytes Relative: 7.9 % (ref 3.0–12.0)
Neutro Abs: 4.4 K/uL (ref 1.4–7.7)
Neutrophils Relative %: 61.2 % (ref 43.0–77.0)
Platelets: 275 K/uL (ref 150.0–400.0)
RBC: 4.4 Mil/uL (ref 3.87–5.11)
RDW: 13.4 % (ref 11.5–15.5)
WBC: 7.2 K/uL (ref 4.0–10.5)

## 2024-01-22 LAB — COMPREHENSIVE METABOLIC PANEL WITH GFR
ALT: 17 U/L (ref 0–35)
AST: 24 U/L (ref 0–37)
Albumin: 4.5 g/dL (ref 3.5–5.2)
Alkaline Phosphatase: 54 U/L (ref 39–117)
BUN: 28 mg/dL — ABNORMAL HIGH (ref 6–23)
CO2: 28 meq/L (ref 19–32)
Calcium: 10 mg/dL (ref 8.4–10.5)
Chloride: 104 meq/L (ref 96–112)
Creatinine, Ser: 1.33 mg/dL — ABNORMAL HIGH (ref 0.40–1.20)
GFR: 38.18 mL/min — ABNORMAL LOW (ref >60.00)
Glucose, Bld: 102 mg/dL — ABNORMAL HIGH (ref 70–99)
Potassium: 5.2 meq/L — ABNORMAL HIGH (ref 3.5–5.1)
Sodium: 142 meq/L (ref 135–145)
Total Bilirubin: 0.6 mg/dL (ref 0.2–1.2)
Total Protein: 7.6 g/dL (ref 6.0–8.3)

## 2024-01-22 LAB — PROTIME-INR
INR: 1.2 ratio — ABNORMAL HIGH (ref 0.8–1.0)
Prothrombin Time: 12.2 s (ref 9.6–13.1)

## 2024-01-22 MED ORDER — HYOSCYAMINE SULFATE 0.125 MG PO TABS: 0.1250 mg | ORAL_TABLET | Freq: Four times a day (QID) | ORAL | 0 refills | Status: DC | PRN

## 2024-01-22 NOTE — Progress Notes (Signed)
 Agree with assessment and plan as outlined.

## 2024-01-22 NOTE — Telephone Encounter (Signed)
 Schedule in-office appointment. Please advise patient to drink plenty of water as dehydration can cause a decrease in kidney function. Plan to get lab work at office appointment. Does not need to fast.

## 2024-01-22 NOTE — Telephone Encounter (Signed)
 Please advise Copied from CRM #[id number]. Topic: Clinical - Lab/Test Results >> Jan 22, 2024  3:40 PM [name] wrote: Reason for CRM: Patient called in stating her kidney function is worse and gastro doctor would like her to see her PCP, but would like to know if Dr. Billy would like her to come in or discuss lab results over the phone

## 2024-01-22 NOTE — Patient Instructions (Addendum)
 Your provider has requested that you go to the basement level for lab work before leaving today. Press B on the elevator. The lab is located at the first door on the left as you exit the elevator.  Your provider has requested that you have an abdominal x ray before leaving today. Please go to the basement floor to our Radiology department for the test.  Toileting tips to help with your constipation - Drink at least 64-80 ounces of water/liquid per day. - Establish a time to try to move your bowels every day.  For many people, this is after a cup of coffee or after a meal such as breakfast. - Sit all of the way back on the toilet keeping your back fairly straight and while sitting up, try to rest the tops of your forearms on your upper thighs.   - Raising your feet with a step stool/squatty potty can be helpful to improve the angle that allows your stool to pass through the rectum. - Relax the rectum feeling it bulge toward the toilet water.  If you feel your rectum raising toward your body, you are contracting rather than relaxing. - Breathe in and slowly exhale. Belly breath by expanding your belly towards your belly button. Keep belly expanded as you gently direct pressure down and back to the anus.  A low pitched GRRR sound can assist with increasing intra-abdominal pressure.  (Can also trying to blow on a pinwheel and make it move, this helps with the same belly breathing) - Repeat 3-4 times. If unsuccessful, contract the pelvic floor to restore normal tone and get off the toilet.  Avoid excessive straining. - To reduce excessive wiping by teaching your anus to normally contract, place hands on outer aspect of knees and resist knee movement outward.  Hold 5-10 second then place hands just inside of knees and resist inward movement of knees.  Hold 5 seconds.  Repeat a few times each way.  Go to the ER if unable to pass gas, severe AB pain, unable to hold down food, any shortness of breath of  chest pain.  Please do the following: Purchase a bottle of Miralax  over the counter as well as a box of 5 mg dulcolax tablets. Take 4 dulcolax tablets. Wait 1 hour. You will then drink 6-8 capfuls of Miralax  mixed in an adequate amount of water/juice/gatorade (you may choose which of these liquids to drink) over the next 2-3 hours. You should expect results within 1 to 6 hours after completing the bowel purge. Go to the er if you have severe AB pain, can not pass gas or stool in over 12 hours, can not hold down any food.    Here some information about pelvic floor dysfunction. We could also refer to pelvic floor physical therapy.   Pelvic Floor Dysfunction, Female Pelvic floor dysfunction (PFD) is a condition that results when the group of muscles and connective tissues that support the organs in the pelvis (pelvic floor muscles) do not work well. These muscles and their connections form a sling that supports the colon and bladder. In women, they also support the uterus. PFD causes pelvic floor muscles to be too weak, too tight, or both. In PFD, muscle movements are not coordinated. This may cause bowel or bladder problems. It may also cause pain. What are the causes? This condition may be caused by an injury to the pelvic area or by a weakening of pelvic muscles. This often results from pregnancy and childbirth  or other types of strain. In many cases, the exact cause is not known. What increases the risk? The following factors may make you more likely to develop this condition: Having chronic bladder tissue inflammation (interstitial cystitis). Being an older person. Being overweight. History of radiation treatment for cancer in the pelvic region. Previous pelvic surgery, such as removal of the uterus (hysterectomy). What are the signs or symptoms? Symptoms of this condition vary and may include: Bladder symptoms, such as: Trouble starting urination and emptying the bladder. Frequent  urinary tract infections. Leaking urine when coughing, laughing, or exercising (stress incontinence). Having to pass urine urgently or frequently. Pain when passing urine. Bowel symptoms, such as: Constipation. Urgent or frequent bowel movements. Incomplete bowel movements. Painful bowel movements. Leaking stool or gas. Unexplained genital or rectal pain. Genital or rectal muscle spasms. Low back pain. Other symptoms may include: A heavy, full, or aching feeling in the vagina. A bulge that protrudes into the vagina. Pain during or after sex. How is this diagnosed? This condition may be diagnosed based on: Your symptoms and medical history. A physical exam. During the exam, your health care provider may check your pelvic muscles for tightness, spasm, pain, or weakness. This may include a rectal exam and a pelvic exam. In some cases, you may have diagnostic tests, such as: Electrical muscle function tests. Urine flow testing. X-ray tests of bowel function. Ultrasound of the pelvic organs. How is this treated? Treatment for this condition depends on the symptoms. Treatment options include: Physical therapy. This may include Kegel exercises to help relax or strengthen the pelvic floor muscles. Biofeedback. This type of therapy provides feedback on how tight your pelvic floor muscles are so that you can learn to control them. Internal or external massage therapy. A treatment that involves electrical stimulation of the pelvic floor muscles to help control pain (transcutaneous electrical nerve stimulation, or TENS). Sound wave therapy (ultrasound) to reduce muscle spasms. Medicines, such as: Muscle relaxants. Bladder control medicines. Surgery to reconstruct or support pelvic floor muscles may be an option if other treatments do not help. Follow these instructions at home: Activity Do your usual activities as told by your health care provider. Ask your health care provider if you  should modify any activities. Do pelvic floor strengthening or relaxing exercises at home as told by your physical therapist. Lifestyle Maintain a healthy weight. Eat foods that are high in fiber, such as beans, whole grains, and fresh fruits and vegetables. Limit foods that are high in fat and processed sugars, such as fried or sweet foods. Manage stress with relaxation techniques such as yoga or meditation. General instructions If you have problems with leakage: Use absorbable pads or wear padded underwear. Wash frequently with mild soap. Keep your genital and anal area as clean and dry as possible. Ask your health care provider if you should try a barrier cream to prevent skin irritation. Take warm baths to relieve pelvic muscle tension or spasms. Take over-the-counter and prescription medicines only as told by your health care provider. Keep all follow-up visits. How is this prevented? The cause of PFD is not always known, but there are a few things you can do to reduce the risk of developing this condition, including: Staying at a healthy weight. Getting regular exercise. Managing stress. Contact a health care provider if: Your symptoms are not improving with home care. You have signs or symptoms of PFD that get worse at home. You develop new signs or symptoms. You have  signs of a urinary tract infection, such as: Fever. Chills. Increased urinary frequency. A burning feeling when urinating. You have not had a bowel movement in 3 days (constipation). Summary Pelvic floor dysfunction results when the muscles and connective tissues in your pelvic floor do not work well. These muscles and their connections form a sling that supports your colon and bladder. In women, they also support the uterus. PFD may be caused by an injury to the pelvic area or by a weakening of pelvic muscles. PFD causes pelvic floor muscles to be too weak, too tight, or a combination of both. Symptoms may  vary from person to person. In most cases, PFD can be treated with physical therapies and medicines. Surgery may be an option if other treatments do not help. This information is not intended to replace advice given to you by your health care provider. Make sure you discuss any questions you have with your health care provider. Document Revised: 10/26/2020 Document Reviewed: 10/26/2020 Elsevier Patient Education  2022 [employer].  Due to recent changes in healthcare laws, you may see the results of your imaging and laboratory studies on MyChart before your provider has had a chance to review them.  We understand that in some cases there may be results that are confusing or concerning to you. Not all laboratory results come back in the same time frame and the provider may be waiting for multiple results in order to interpret others.  Please give us  48 hours in order for your provider to thoroughly review all the results before contacting the office for clarification of your results.    I appreciate the  opportunity to care for you  Thank You   [facility]

## 2024-01-25 ENCOUNTER — Other Ambulatory Visit: Payer: Self-pay | Admitting: Internal Medicine

## 2024-01-25 DIAGNOSIS — K5909 Other constipation: Secondary | ICD-10-CM

## 2024-01-28 ENCOUNTER — Ambulatory Visit (INDEPENDENT_AMBULATORY_CARE_PROVIDER_SITE_OTHER): Admitting: Internal Medicine

## 2024-01-28 ENCOUNTER — Encounter: Payer: Self-pay | Admitting: Internal Medicine

## 2024-01-28 VITALS — BP 106/74 | HR 94 | Temp 98.0°F | Ht 60.0 in | Wt 146.6 lb

## 2024-01-28 DIAGNOSIS — R944 Abnormal results of kidney function studies: Secondary | ICD-10-CM

## 2024-01-28 DIAGNOSIS — K5909 Other constipation: Secondary | ICD-10-CM | POA: Diagnosis not present

## 2024-01-28 MED ORDER — LINACLOTIDE 72 MCG PO CAPS: 72.0000 ug | ORAL_CAPSULE | Freq: Every day | ORAL | 2 refills | Status: DC

## 2024-01-28 NOTE — Progress Notes (Unsigned)
 [facility] [address] [place] [address] Dept: [phone] Dept Fax: [phone]  Acute Care Office Visit  Subjective:   [name] 05/04/1946 01/28/2024  No chief complaint on file.   HPI:  Discussed the use of AI scribe software for clinical note transcription with the patient, who gave verbal consent to proceed.  History of Present Illness   [name] is a 78 year old female who presents for evaluation of decreased kidney function.  She is concerned about her kidney function, which was noted to be decreased during a recent visit to her gastroenterologist. She acknowledges low water intake. Her orthopedic doctor will not administer injections for her sciatica and bulging disc if her kidney function is impaired. She had a consultation with her orthopedic doctor this morning but did not receive an injection.  She takes Gemtesa and trospium for her overactive bladder, which she finds effective, and inquires if these medications could affect her kidney function. She has a history of elevated potassium levels and prediabetes, which she monitors regularly. She recalls elevated BUN and creatinine levels and a decreased GFR in her recent blood work.  She underwent a bowel preparation over the weekend and was supposed to start taking Linzess  30 minutes prior to breakfast with a teaspoon of bitter pepper. She missed her dose yesterday due to another appointment and took it later in the day, experiencing some cramping and anticipating a bowel movement soon. She previously experienced vomiting during bowel movements and sought medical attention for this issue.  Her social history includes a preference for drinking Pepsi over water, although she is attempting to drink more water. No new symptoms related to kidney function aside from those discussed.       The following portions of the patient's history were reviewed  and updated as appropriate: past medical history, past surgical history, family history, social history, allergies, medications, and problem list.   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   History of breast cancer 07/11/2023   Prediabetes 03/10/2021   Overactive bladder 11/02/2019   Aortic atherosclerosis (HCC) 03/24/2019   Localized osteoarthritis of right ankle 10/02/2017   Retained orthopedic hardware 10/02/2017   Urge incontinence of urine 07/24/2016   Fibrosis of liver 02/01/2015   Spondylosis of cervical region without myelopathy or radiculopathy 11/02/2014   Depression with anxiety 12/25/2011   HTN (hypertension) 12/25/2011   GERD (gastroesophageal reflux disease) 12/25/2011   COPD, moderate (HCC) 12/25/2011   Chronic constipation 12/25/2011   Seasonal allergic rhinitis 10/02/2011   Osteoarthritis, knee 11/16/2010   Past Medical History:  Diagnosis Date   Allergy    Asthma    Atypical chest pain 05/29/2011   Normal cardiology eval: ECHO and stress testing 2012.  Cardiac cath: normal 2014 for worsening dyspnea     Bimalleolar fracture of right ankle 03/20/2017   Breast cancer (HCC)    Bulging lumbar disc    L4 OR L5 TAKES STEROID INJECTIONS FOR TOOK INJECTION MARCH 2019   Chronic bronchitis (HCC) FEW YEARS AGO   Chronic hepatitis C (HCC)    TOOK HARVONI TX 2017    Colon polyp    COPD (chronic obstructive pulmonary disease) (HCC)    Diverticulitis 83YRS AGO   Emphysema  of lung (HCC)    TOOK PULMONARY FUCTION TEST AND PASSED FEW WEEKS AGO AT [facility]ILY  MD   Gallstones    GERD (gastroesophageal reflux disease)    History of kidney stones 20 YRS AGO    Hormone replacement therapy (postmenopausal) 08/22/2012   Hyperlipidemia     Hypertension    Malignant neoplasm of upper-outer quadrant of left breast in female, estrogen receptor positive (HCC) 09/30/2020   Osteoarthritis    Pneumonia AT BIRTH   Spondylosis of cervical region without myelopathy or radiculopathy    Squamous cell carcinoma of skin 06/15/2013   Arms and leg; [facility], Dr. Lynnell     Past Surgical History:  Procedure Laterality Date   ABDOMINAL HYSTERECTOMY  1980   PARTIAL   ANKLE ARTHROSCOPY Right 10/02/2017   Procedure: RIGHT ANKLE ARTHROSCOPY WITH EXTENSIVE DEBRIDEMENT;  Surgeon: Yvone Rush, MD;  Location: [facility];  Service: Orthopedics;  Laterality: Right;  90 MINUTES FOR CASE   BREAST BIOPSY  02/07/2010   BREAST LUMPECTOMY WITH RADIOACTIVE SEED LOCALIZATION Left 10/21/2020   Procedure: LEFT BREAST LUMPECTOMY WITH RADIOACTIVE SEED LOCALIZATION;  Surgeon: Vernetta Berg, MD;  Location: [facility] OR;  Service: General;  Laterality: Left;  60 MINUTES ROOM 2   BREAST SURGERY  2011   biopsy   CATARACT EXTRACTION     CHOLECYSTECTOMY  1999   LAPAROSCOPIC   EYE SURGERY     FRACTURE SURGERY     HARDWARE REMOVAL Right 10/02/2017   Procedure: HARDWARE REMOVAL;  Surgeon: Yvone Rush, MD;  Location: [facility];  Service: Orthopedics;  Laterality: Right;   INJECTION OF SILICONE OIL Left 01/04/2020   Procedure: INJECTION OF SILICONE OIL;  Surgeon: Jarold Mayo, MD;  Location: [facility] OR;  Service: Ophthalmology;  Laterality: Left;   MEMBRANE PEEL Left 01/04/2020   Procedure: MEMBRANE PEEL;  Surgeon: Jarold Mayo, MD;  Location: [facility] OR;  Service: Ophthalmology;  Laterality: Left;   ORIF ANKLE FRACTURE Right 03/20/2017   Procedure: OPEN REDUCTION INTERNAL FIXATION (ORIF) ANKLE FRACTURE;  Surgeon: Yvone Rush, MD;  Location: [facility];  Service: Orthopedics;  Laterality: Right;   PARS PLANA VITRECTOMY Left 01/04/2020   Procedure: PARS PLANA VITRECTOMY WITH 25 GAUGE;  Surgeon: Jarold Mayo,  MD;  Location: [facility] OR;  Service: Ophthalmology;  Laterality: Left;   PHOTOCOAGULATION WITH LASER Left 01/04/2020   Procedure: PHOTOCOAGULATION WITH LASER;  Surgeon: Jarold Mayo, MD;  Location: [facility] OR;  Service: Ophthalmology;  Laterality: Left;   TONSILLECTOMY     TONSILLECTOMY AND ADENOIDECTOMY  1950's   TUBAL LIGATION     Family History  Problem Relation Age of Onset   Heart disease Mother    Stroke Brother    Stroke Maternal Uncle    COPD Paternal Uncle    Colon cancer Neg Hx    Stomach cancer Neg Hx    Esophageal cancer Neg Hx     Current Outpatient Medications:    albuterol  (VENTOLIN  HFA) 108 (90 Base) MCG/ACT inhaler, Inhale 2 puffs into the lungs every 6 (six) hours as needed for wheezing or shortness of breath., Disp: 18 g, Rfl: 0   amLODipine  (NORVASC ) 5 MG tablet, Take 5 mg by mouth every evening., Disp: , Rfl:    anastrozole  (ARIMIDEX ) 1 MG tablet, Take 1 tablet (1 mg total) by mouth daily., Disp: 90 tablet, Rfl: 1   apixaban  (ELIQUIS ) 2.5 MG TABS tablet, Take 1 tablet (2.5 mg total) by mouth 2 (two) times daily., Disp: 60 tablet,  Rfl: 11   aspirin EC 81 MG tablet, Take 81 mg by mouth daily. Swallow whole., Disp: , Rfl:    atorvastatin  (LIPITOR) 20 MG tablet, Take 20 mg by mouth every evening. , Disp: , Rfl:    B Complex-Folic Acid (B COMPLEX VITAMINS, W/ FA,) CAPS, Take 1 capsule by mouth daily., Disp: , Rfl:    cholecalciferol (VITAMIN D3) 25 MCG (1000 UNIT) tablet, Take 1,000 Units by mouth daily., Disp: , Rfl:    Fluticasone-Umeclidin-Vilant (TRELEGY ELLIPTA ) 100-62.5-25 MCG/ACT AEPB, INHALE 1 PUFF ONCE DAILY, Disp: 60 each, Rfl: 11   FLUZONE HIGH-DOSE 0.5 ML injection, , Disp: , Rfl:    GEMTESA 75 MG TABS, Take 1 tablet by mouth daily., Disp: , Rfl:    hyoscyamine  (LEVSIN ) 0.125 MG tablet, Take 1 tablet (0.125 mg total) by mouth every 6 (six) hours as needed for cramping., Disp: 30 tablet, Rfl: 0   ipratropium (ATROVENT) 0.06 % nasal spray, Place 2 sprays into both  nostrils daily as needed for rhinitis., Disp: , Rfl:    levocetirizine (XYZAL) 5 MG tablet, , Disp: , Rfl:    LINZESS  72 MCG capsule, TAKE 1 CAPSULE BY MOUTH ONCE DAILY AS NEEDED FOR  CONSTIPATION, Disp: 30 capsule, Rfl: 0   montelukast  (SINGULAIR ) 10 MG tablet, Take 10 mg by mouth every evening. , Disp: , Rfl:    PREVIDENT  5000 BOOSTER PLUS 1.1 % PSTE, Place 1 application onto teeth daily., Disp: , Rfl:    PREVNAR 20 0.5 ML injection, , Disp: , Rfl:    Trospium Chloride 60 MG CP24, Take 1 capsule by mouth every morning., Disp: , Rfl:    venlafaxine  XR (EFFEXOR -XR) 75 MG 24 hr capsule, Take 1 capsule (75 mg total) by mouth daily with breakfast., Disp: 90 capsule, Rfl: 0 Allergies  Allergen Reactions   Chantix [Varenicline] Other (See Comments)    Makes her feel crazy   Codeine Itching     ROS: A complete ROS was performed with pertinent positives/negatives noted in the HPI. The remainder of the ROS are negative.    Objective:   There were no vitals filed for this visit.  GENERAL: Well-appearing, in NAD. Well nourished.  SKIN: Pink, warm and dry. No rash, lesion, ulceration, or ecchymoses.  NECK: Trachea midline. Full ROM w/o pain or tenderness. No lymphadenopathy.  RESPIRATORY: Chest wall symmetrical. Respirations even and non-labored. Breath sounds clear to auscultation bilaterally.  CARDIAC: S1, S2 present, regular rate and rhythm. Peripheral pulses 2+ bilaterally.  EXTREMITIES: Without clubbing, cyanosis, or edema.  NEUROLOGIC: No motor or sensory deficits. Steady, even gait.  PSYCH/MENTAL STATUS: Alert, oriented x 3. Cooperative, appropriate mood and affect.    No results found for any visits on 01/28/24.    Assessment & Plan:  Assessment and Plan    Decreased kidney function Recent decrease likely due to dehydration with elevated creatinine, BUN, decreased GFR, and elevated potassium. Current medications not contributing. Normal function required for sciatica injections. -  Recheck kidney function today. - Advise increased water intake.  Constipation Managed with Linzess . Recent colonoscopy prep may have affected bowel habits. Missed dose resumed today with cramping indicating onset of action. - Refill Linzess  prescription. - Continue Linzess  as prescribed.  Prediabetes Ongoing monitoring of blood glucose levels.       No orders of the defined types were placed in this encounter.  No orders of the defined types were placed in this encounter.  Lab Orders  No laboratory test(s) ordered today   No  images are attached to the encounter or orders placed in the encounter.  No follow-ups on file.   Rosina Senters, FNP

## 2024-01-29 LAB — BASIC METABOLIC PANEL WITH GFR
BUN: 17 mg/dL (ref 6–23)
CO2: 29 meq/L (ref 19–32)
Calcium: 9.4 mg/dL (ref 8.4–10.5)
Chloride: 107 meq/L (ref 96–112)
Creatinine, Ser: 1.13 mg/dL (ref 0.40–1.20)
GFR: 46.42 mL/min — ABNORMAL LOW (ref >60.00)
Glucose, Bld: 118 mg/dL — ABNORMAL HIGH (ref 70–99)
Potassium: 4.2 meq/L (ref 3.5–5.1)
Sodium: 143 meq/L (ref 135–145)

## 2024-02-03 ENCOUNTER — Ambulatory Visit: Payer: Self-pay | Admitting: Internal Medicine

## 2024-02-03 DIAGNOSIS — N1831 Chronic kidney disease, stage 3a: Secondary | ICD-10-CM | POA: Insufficient documentation

## 2024-02-11 ENCOUNTER — Encounter: Payer: Self-pay | Admitting: Adult Health

## 2024-02-11 ENCOUNTER — Ambulatory Visit (INDEPENDENT_AMBULATORY_CARE_PROVIDER_SITE_OTHER): Admitting: Adult Health

## 2024-02-11 VITALS — BP 130/70 | HR 96 | Ht 60.0 in | Wt 145.2 lb

## 2024-02-11 DIAGNOSIS — F17210 Nicotine dependence, cigarettes, uncomplicated: Secondary | ICD-10-CM | POA: Diagnosis not present

## 2024-02-11 DIAGNOSIS — J449 Chronic obstructive pulmonary disease, unspecified: Secondary | ICD-10-CM | POA: Diagnosis not present

## 2024-02-11 DIAGNOSIS — Z7901 Long term (current) use of anticoagulants: Secondary | ICD-10-CM

## 2024-02-11 DIAGNOSIS — J439 Emphysema, unspecified: Secondary | ICD-10-CM

## 2024-02-11 DIAGNOSIS — J310 Chronic rhinitis: Secondary | ICD-10-CM

## 2024-02-11 DIAGNOSIS — Z720 Tobacco use: Secondary | ICD-10-CM

## 2024-02-11 DIAGNOSIS — J432 Centrilobular emphysema: Secondary | ICD-10-CM

## 2024-02-11 DIAGNOSIS — I2699 Other pulmonary embolism without acute cor pulmonale: Secondary | ICD-10-CM | POA: Diagnosis not present

## 2024-02-11 MED ORDER — TRELEGY ELLIPTA 100-62.5-25 MCG/ACT IN AEPB: 1.0000 | INHALATION_SPRAY | Freq: Every day | RESPIRATORY_TRACT | 5 refills | Status: AC

## 2024-02-11 MED ORDER — MONTELUKAST SODIUM 10 MG PO TABS: 10.0000 mg | ORAL_TABLET | Freq: Every day | ORAL | 11 refills | Status: AC

## 2024-02-11 NOTE — Patient Instructions (Signed)
 Eliquis   2.5mg  Twice daily  NO NSAIDs -advil, aleve or ibuprofen, etc  Continue on Trelegy inhaler 1 puff daily Albuterol  inhaler as needed Continue on Singulair  daily Work on quitting smoking .  Advance activity as tolerated.  Follow up with Oncology as planned.  Follow up with Dr. Kara or Sincere Liuzzi NP in 6 months and As needed

## 2024-02-11 NOTE — Progress Notes (Signed)
 @Patient  ID: [name], female    DOB: 04-18-1946, 78 y.o.   MRN: [id number]  Chief Complaint  Patient presents with   pulmonary embulis   Emphysema    Referring provider: Billy Knee, FNP  HPI: 78 yo female active smoker followed for Emphysema.  Dx with acute PE 08/2023 during acute illness with Covid 19 infection and Tamoxifen  use.  Medical history significant for Breast cancer  TEST/EVENTS :  PFTs on 05/20/13 showed FEV1 of 86%-1.76, FVC of 88%-2.39 in ratio 74.    08/2022 Normal PFT    CT chest on 08/11/23 showed small subsegmental PE in lingula and RLL mild emphysema, postradiation change in the left upper lobe.  02/11/2024 Follow up : Emphysema, PE  Discussed the use of AI scribe software for clinical note transcription with the patient, who gave verbal consent to proceed.  History of Present Illness [name] is a 78 year old female with emphysema who presents for a three month follow-up.  In February 2025, she was diagnosed with an acute pulmonary embolism, considered provoked due to an acute illness with COVID-19 infection and concurrent use of tamoxifen . A CT chest on August 11, 2023, revealed a small subsegmental pulmonary embolism in the lingula and right lower lobe. She was started on Eliquis  therapy and completed a three-month course by May 2025. She is currently on a low dose of Eliquis  at 2.5 mg twice daily with no bleeding reported.  She was switched from tamoxifen  to anastrozole  by oncology with history of breast cancer. Anastrozole  has a low VTE risk. Last office visit was discussed with patient and will continue on low dose Eliquis  while taking Anastrozole . Endorses compliance. Denies any known bleeding.   She has a history of emphysema and remains on Trelegy inhaler daily. She is active, engaging in activities such as pickleball, and continues to smoke despite previous attempts to quit. She is also on Singulair  for chronic allergies and has been  compliant with her medications.  She lives alone and enjoys traveling, having recently visited [address] and [address]. She has no children but considers her cat as her child. She enjoys visiting [place] , particularly for the fall scenery and the [employer] community.       Allergies  Allergen Reactions   Chantix [Varenicline] Other (See Comments)    Makes her feel crazy   Codeine Itching    Immunization History  Administered Date(s) Administered   Fluad Quad(high Dose 65+) 03/11/2017, 03/28/2018, 03/23/2020, 04/07/2021, 04/17/2023   Fluad Trivalent(High Dose 65+) 03/15/2011, 03/11/2017, 03/28/2018   Hep A / Hep B 02/01/2015, 03/15/2015, 08/15/2015, 01/17/2023, 02/23/2023, 07/29/2023   Influenza Split 03/02/2013   Influenza, High Dose Seasonal PF 04/15/2012, 04/21/2013, 04/27/2014, 05/05/2015, 03/21/2016, 03/24/2019, 02/28/2022, 04/17/2023   Influenza,inj,Quad PF,6+ Mos 03/21/2016, 03/24/2019   Influenza,trivalent, recombinat, inj, PF 03/28/2010, 04/01/2010, 03/02/2013   PFIZER(Purple Top)SARS-COV-2 Vaccination 07/28/2019, 08/18/2019, 04/07/2020   PNEUMOCOCCAL CONJUGATE-20 04/17/2023   Pneumococcal Conjugate,unspecified 04/17/2023   Pneumococcal Conjugate-13 04/27/2014, 05/04/2014   Pneumococcal Polysaccharide-23 03/28/2010, 03/28/2010, 07/02/2010, 07/02/2010, 03/21/2016   Respiratory Syncytial Virus Vaccine,Recomb Aduvanted(Arexvy) 02/28/2022   Tdap 03/02/2005, 06/07/2019   Zoster Recombinant(Shingrix) 03/25/2018, 01/07/2019   Zoster, Live 03/28/2010    Past Medical History:  Diagnosis Date   Allergy    Asthma    Atypical chest pain 05/29/2011   Normal cardiology eval: ECHO and stress testing 2012.  Cardiac cath: normal 2014 for worsening dyspnea     Bimalleolar fracture of right ankle 03/20/2017   Breast cancer (HCC)  Bulging lumbar disc    L4 OR L5 TAKES STEROID INJECTIONS FOR TOOK INJECTION MARCH 2019   Chronic bronchitis (HCC) FEW YEARS AGO   Chronic hepatitis C  (HCC)    TOOK HARVONI TX 2017    Colon polyp    COPD (chronic obstructive pulmonary disease) (HCC)    Diverticulitis 71YRS AGO   Emphysema of lung (HCC)    TOOK PULMONARY FUCTION TEST AND PASSED FEW WEEKS AGO AT [facility]  MD   Gallstones    GERD (gastroesophageal reflux disease)    History of kidney stones 20 YRS AGO    Hormone replacement therapy (postmenopausal) 08/22/2012   Hyperlipidemia    Hypertension    Malignant neoplasm of upper-outer quadrant of left breast in female, estrogen receptor positive (HCC) 09/30/2020   Osteoarthritis    Pneumonia AT BIRTH   Spondylosis of cervical region without myelopathy or radiculopathy    Squamous cell carcinoma of skin 06/15/2013   Arms and leg; [facility], Dr. Lynnell      Tobacco History: Social History   Tobacco Use  Smoking Status Every Day   Current packs/day: 1.00   Average packs/day: 1 pack/day for 63.6 years (63.6 ttl pk-yrs)   Types: Cigarettes   Start date: 07/20/1960  Smokeless Tobacco Never  Tobacco Comments   Smoking 3 quarters of a ppd.  She is not trying to quit.  02/11/24.  Hfb RN   Ready to quit: Not Answered Counseling given: Not Answered Tobacco comments: Smoking 3 quarters of a ppd.  She is not trying to quit.  02/11/24.  Hfb RN   Outpatient Medications Prior to Visit  Medication Sig Dispense Refill   albuterol  (VENTOLIN  HFA) 108 (90 Base) MCG/ACT inhaler Inhale 2 puffs into the lungs every 6 (six) hours as needed for wheezing or shortness of breath. 18 g 0   amLODipine  (NORVASC ) 5 MG tablet Take 5 mg by mouth every evening.     anastrozole  (ARIMIDEX ) 1 MG tablet Take 1 tablet (1 mg total) by mouth daily. 90 tablet 1   apixaban  (ELIQUIS ) 2.5 MG TABS tablet Take 1 tablet (2.5 mg total) by mouth 2 (two) times daily. 60 tablet 11   atorvastatin  (LIPITOR) 20 MG tablet Take 20 mg by mouth every evening.      B Complex-Folic Acid (B COMPLEX VITAMINS, W/ FA,) CAPS Take 1 capsule by mouth daily.      cholecalciferol (VITAMIN D3) 25 MCG (1000 UNIT) tablet Take 1,000 Units by mouth daily.     FLUZONE HIGH-DOSE 0.5 ML injection      GEMTESA 75 MG TABS Take 1 tablet by mouth daily.     hyoscyamine  (LEVSIN ) 0.125 MG tablet Take 1 tablet (0.125 mg total) by mouth every 6 (six) hours as needed for cramping. 30 tablet 0   ipratropium (ATROVENT) 0.06 % nasal spray Place 2 sprays into both nostrils daily as needed for rhinitis.     levocetirizine (XYZAL) 5 MG tablet  (Patient taking differently: Take 5 mg by mouth as needed.)     linaclotide  (LINZESS ) 72 MCG capsule Take 1 capsule (72 mcg total) by mouth daily before breakfast. 30 capsule 2   PREVIDENT  5000 BOOSTER PLUS 1.1 % PSTE Place 1 application onto teeth daily.     PREVNAR 20 0.5 ML injection      Trospium Chloride 60 MG CP24 Take 1 capsule by mouth every morning.     venlafaxine  XR (EFFEXOR -XR) 75 MG 24 hr capsule Take 1 capsule (75 mg total)  by mouth daily with breakfast. 90 capsule 0   aspirin EC 81 MG tablet Take 81 mg by mouth daily. Swallow whole. (Patient not taking: Reported on 01/28/2024)     Fluticasone-Umeclidin-Vilant (TRELEGY ELLIPTA ) 100-62.5-25 MCG/ACT AEPB INHALE 1 PUFF ONCE DAILY 60 each 11   montelukast  (SINGULAIR ) 10 MG tablet Take 10 mg by mouth every evening.      No facility-administered medications prior to visit.     Review of Systems:   Constitutional:   No  weight loss, night sweats,  Fevers, chills, fatigue, or  lassitude.  HEENT:   No headaches,  Difficulty swallowing,  Tooth/dental problems, or  Sore throat,                No sneezing, itching, ear ache, nasal congestion, post nasal drip,   CV:  No chest pain,  Orthopnea, PND, swelling in lower extremities, anasarca, dizziness, palpitations, syncope.   GI  No heartburn, indigestion, abdominal pain, nausea, vomiting, diarrhea, change in bowel habits, loss of appetite, bloody stools.   Resp: No shortness of breath with exertion or at rest.  No excess mucus, no  productive cough,  No non-productive cough,  No coughing up of blood.  No change in color of mucus.  No wheezing.  No chest wall deformity  Skin: no rash or lesions.  GU: no dysuria, change in color of urine, no urgency or frequency.  No flank pain, no hematuria   MS:  No joint pain or swelling.  No decreased range of motion.  No back pain.    Physical Exam  BP 130/70   Pulse 96   Ht 5' (1.524 m)   Wt 145 lb 3.2 oz (65.9 kg)   SpO2 96% Comment: RA  BMI 28.36 kg/m   GEN: A/Ox3; pleasant , NAD, well nourished    HEENT:  [place]/AT,  NOSE-clear, THROAT-clear, no lesions, no postnasal drip or exudate noted.   NECK:  Supple w/ fair ROM; no JVD; normal carotid impulses w/o bruits; no thyromegaly or nodules palpated; no lymphadenopathy.    RESP  Clear  P & A; w/o, wheezes/ rales/ or rhonchi. no accessory muscle use, no dullness to percussion  CARD:  RRR, no m/r/g, no peripheral edema, pulses intact, no cyanosis or clubbing.  GI:   Soft & nt; nml bowel sounds; no organomegaly or masses detected.   Musco: Warm bil, no deformities or joint swelling noted.   Neuro: alert, no focal deficits noted.    Skin: Warm, no lesions or rashes    Lab Results:  CBC    Component Value Date/Time   WBC 7.2 01/22/2024 0922   RBC 4.40 01/22/2024 0922   HGB 13.4 01/22/2024 0922   HGB 13.1 12/10/2023 1113   HCT 40.5 01/22/2024 0922   PLT 275.0 01/22/2024 0922   PLT 226 12/10/2023 1113   MCV 92.1 01/22/2024 0922   MCH 31.1 12/10/2023 1113   MCHC 33.1 01/22/2024 0922   RDW 13.4 01/22/2024 0922   LYMPHSABS 1.9 01/22/2024 0922   MONOABS 0.6 01/22/2024 0922   EOSABS 0.2 01/22/2024 0922   BASOSABS 0.1 01/22/2024 0922    BMET    Component Value Date/Time   NA 143 01/28/2024 1502   K 4.2 01/28/2024 1502   CL 107 01/28/2024 1502   CO2 29 01/28/2024 1502   GLUCOSE 118 (H) 01/28/2024 1502   BUN 17 01/28/2024 1502   CREATININE 1.13 01/28/2024 1502   CREATININE 1.00 12/10/2023 1113   CALCIUM   9.4 01/28/2024  1502   GFRNONAA 58 (L) 12/10/2023 1113    BNP No results found for: BNP  ProBNP No results found for: PROBNP  Imaging:   Administration History     None          Latest Ref Rng & Units 08/14/2022    5:04 PM  PFT Results  FVC-Predicted Pre % 85   FVC-Post L 1.85   FVC-Predicted Post % 82   Pre FEV1/FVC % % 75   Post FEV1/FCV % % 81   FEV1-Pre L 1.44   FEV1-Predicted Pre % 86   FEV1-Post L 1.50   DLCO uncorrected ml/min/mmHg 10.42   DLCO UNC% % 63   DLCO corrected ml/min/mmHg 10.42   DLCO COR %Predicted % 63   DLVA Predicted % 83   TLC L 4.30   TLC % Predicted % 96   RV % Predicted % 116     No results found for: NITRICOXIDE      Assessment & Plan:   No problem-specific Assessment & Plan notes found for this encounter. Assessment and Plan Assessment & Plan Emphysema (COPD) in an active smoker   Chronic emphysema symptoms are well-managed with the Trelegy inhaler. She continues to smoke despite previous cessation attempts. Continue Trelegy inhaler daily and refill as needed. Encourage smoking cessation using strategies such as setting a target date, cutting back, using a stress ball, and sugarless candy.  Nicotine  dependence, current smoker   She continues to smoke despite previous cessation attempts. Emphasize the importance of smoking cessation for overall health and COPD management. Encourage smoking cessation with strategies such as setting a target date, cutting back, using a stress ball, and sugarless candy.  History of provoked pulmonary embolism, on extended low-dose anticoagulation   She experienced a provoked pulmonary embolism in February 2025 most likely due to COVID-19 and tamoxifen . Currently, she is on low-dose Eliquis  (2.5 mg twice daily) due to ongoing anastrozole  therapy (low risk for VTE) . Oncology suggests aspirin may suffice, we reviewed this in detail. Will Continue low-dose Eliquis  2.5 mg twice daily and avoid NSAIDs  such as ibuprofen, Aleve, and Excedrin. Once off Anastrozole  would d/c Eliquis .   Chronic allergic rhinitis   Chronic allergic rhinitis is well-managed with Singulair . Continue Singulair  daily and refill for the year.  History of breast cancer, on anastrozole    Continue follow up with Oncology    Plan  Patient Instructions  Eliquis   2.5mg  Twice daily  NO NSAIDs -advil, aleve or ibuprofen, etc  Continue on Trelegy inhaler 1 puff daily Albuterol  inhaler as needed Continue on Singulair  daily Work on quitting smoking .  Advance activity as tolerated.  Follow up with Oncology as planned.  Follow up with Dr. Kara or Thea Holshouser NP in 6 months and As needed          Madelin Stank, NP 02/11/2024

## 2024-03-03 ENCOUNTER — Other Ambulatory Visit: Payer: Self-pay | Admitting: Nurse Practitioner

## 2024-03-03 DIAGNOSIS — Z170 Estrogen receptor positive status [ER+]: Secondary | ICD-10-CM

## 2024-03-16 NOTE — Progress Notes (Unsigned)
 03/17/2024 [name] [id number] May 29, 1946  Referring provider: Billy Knee, FNP Primary GI doctor: Dr. Leigh  ASSESSMENT AND PLAN:  Vomiting during bowel movements x 1 year with history of constipation 01/22/2024 KUB shows nonobstructive bowel gas pattern moderate to large volume stool Treated with bowel purge and Linzess  72 mcg daily with improvement of her symptoms Chronic constipation improved with Linzess  and dietary changes. Occasional incomplete bowel movements and one vomiting episode. No severe symptoms to suggest pelvic floor dysfunction intervention. - Continue Linzess  72 mcg daily in the morning. - Continue Benefiber daily, currently at one and a quarter tablespoons. - Encourage dietary intake of kiwi and chia seeds for natural fiber. - Switch hyoscyamine  to sublingual form for as-needed use. - Consider referral to pelvic floor physical therapy if symptoms worsen or if urinary symptoms develop. - Consider CT scan if symptoms change significantly.  Cirrhosis secondary to hepatitis C, follows with [facility] gastroenterology AST 24 ALT 17 Alkphos 54 TBili 0.6 INR 01/22/2024 1.2  MELD 3.0: 12 at 01/22/2024  9:22 AM MELD-Na: 11 at 01/22/2024  9:22 AM Calculated from: Serum Creatinine: 1.33 mg/dL at 2/76/7974  0:77 AM Serum Sodium: 142 mEq/L (Using max of 137 mEq/L) at 01/22/2024  9:22 AM Total Bilirubin: 0.6 mg/dL (Using min of 1 mg/dL) at 2/76/7974  0:77 AM Serum Albumin: 4.5 g/dL (Using max of 3.5 g/dL) at 2/76/7974  0:77 AM INR(ratio): 1.2 ratio at 01/22/2024  9:22 AM Age at listing (hypothetical): 78 years Sex: Female at 01/22/2024  9:22 AM No ascites on exam, reminded about low sodium diet, no astrexis Appears to be compensated at this time No evidence of portal HTN, if any thrombocytopenia or evidence suggest EGD  Continue follow up with [facility]  personal history of tubular adenomatous polyps 06/21/2023 colonoscopy for positive Cologuard adequate  preparation 3 polyps 3 to 4 mm, 2 polyps 3 to 4 mm, one 8 mm polyp, 3 mm polyp, torturous colon, tics transverse left colon, internal hemorrhoids.   All polyps were TA recall 3 years (06/20/2026)  Pelvic floor dysfunction Suspected pelvic floor dysfunction contributing to incomplete bowel movements and constipation. - Provide information on pelvic floor dysfunction. - Discuss potential benefits of pelvic floor physical therapy, will call back if she would like referral.  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  Anticoagulation to continue for two and a half more years due to concurrent antiestrogen therapy for breast cancer.  History of left breast cancer  status postlumpectomy and radiation 2022  COPD Advised to stop smoking  Patient Care Team: Billy Knee, FNP as PCP - General (Internal Medicine) Tyree Nanetta SAILOR, RN as Oncology Nurse Navigator Vernetta Berg, MD as Consulting Physician (General Surgery) Dewey Rush, MD as Consulting Physician (Radiation Oncology) Jarold Mayo, MD as Consulting Physician (Ophthalmology) March Alyce SAUNDERS, MD as Referring Physician (Gastroenterology) Darol Norris, MD as Referring Physician (Obstetrics and Gynecology) Lynnell Nottingham, MD as Consulting Physician (Dermatology) Lanny Callander, MD as Attending Physician (Hematology and Oncology) Tat, Asberry RAMAN, DO as Consulting Physician (Neurology)  HISTORY OF PRESENT ILLNESS: 78 y.o. female with medical history significant for hepatitis C related cirrhosis, COPD, history of breast cancer, history of positive Cologuard presents with vomiting.  Patient follows with Dr. Kearney [facility] hepatology for cirrhosis, last saw him 10/22/2023.  Last seen in the office 01/22/2024 by myself for chronic constipation  Discussed the use of AI scribe software for clinical note transcription with the patient, who gave verbal consent to  proceed.  History of Present  Illness   [name] is a 78 year old female with a history of diverticulosis who presents for follow-up of constipation and gastrointestinal symptoms.  She has a history of constipation and gastrointestinal discomfort, including vomiting and incomplete bowel movements. An x-ray previously showed a moderate to large stool burden without obstruction. She was prescribed a bowel purge and instructed to restart Linzess  daily, along with hyoscyamine  for spasms associated with constipation.  Since the last visit, there has been improvement in her symptoms. Initially, there was a slow response to treatment, but after about a week and a half, her condition began to improve. She has increased her Benefiber intake to one and a quarter tablespoons daily to achieve more regular bowel movements. She is now having bowel movements every day or every other day, which she describes as an improvement.  Her diet includes kiwis and blackberries. She consumes chia seeds as a natural fiber source. She takes Linzess  72 mcg in the morning and Benefiber with warm tea.  She reports only one episode of vomiting since the last visit. No nausea, heartburn, trouble swallowing, abdominal pain, or blood in the stool. She has used hyoscyamine  once, which did not help due to difficulty swallowing the pill quickly enough. She wants to switch to a sublingual form of the medication.  No shortness of breath, chest discomfort, abdominal or leg swelling. The patient reports a little gas every once in a while, but not significant.       Social history:  She  reports that she has been smoking cigarettes. She started smoking about 63 years ago. She has a 63.7 pack-year smoking history. She has never used smokeless tobacco. She reports that she does not currently use alcohol. She reports that she does not use drugs.   RELEVANT GI HISTORY, LABS, IMAGING:  CBC    Component Value Date/Time   WBC 7.2 01/22/2024 0922   RBC 4.40  01/22/2024 0922   HGB 13.4 01/22/2024 0922   HGB 13.1 12/10/2023 1113   HCT 40.5 01/22/2024 0922   PLT 275.0 01/22/2024 0922   PLT 226 12/10/2023 1113   MCV 92.1 01/22/2024 0922   MCH 31.1 12/10/2023 1113   MCHC 33.1 01/22/2024 0922   RDW 13.4 01/22/2024 0922   LYMPHSABS 1.9 01/22/2024 0922   MONOABS 0.6 01/22/2024 0922   EOSABS 0.2 01/22/2024 0922   BASOSABS 0.1 01/22/2024 0922   Recent Labs    06/11/23 1118 08/11/23 1323 10/01/23 1052 12/10/23 1113 01/22/24 0922  HGB 13.8 14.1 13.6 13.1 13.4    CMP     Component Value Date/Time   NA 143 01/28/2024 1502   K 4.2 01/28/2024 1502   CL 107 01/28/2024 1502   CO2 29 01/28/2024 1502   GLUCOSE 118 (H) 01/28/2024 1502   BUN 17 01/28/2024 1502   CREATININE 1.13 01/28/2024 1502   CREATININE 1.00 12/10/2023 1113   CALCIUM  9.4 01/28/2024 1502   PROT 7.6 01/22/2024 0922   ALBUMIN 4.5 01/22/2024 0922   AST 24 01/22/2024 0922   AST 26 12/10/2023 1113   ALT 17 01/22/2024 0922   ALT 16 12/10/2023 1113   ALKPHOS 54 01/22/2024 0922   BILITOT 0.6 01/22/2024 0922   BILITOT 0.5 12/10/2023 1113   GFRNONAA 58 (L) 12/10/2023 1113      Latest Ref Rng & Units 01/22/2024    9:22 AM 12/10/2023   11:13 AM 10/01/2023   10:52 AM  Hepatic Function  Total Protein  6.0 - 8.3 g/dL 7.6  7.1  7.1   Albumin 3.5 - 5.2 g/dL 4.5  4.2  4.2   AST 0 - 37 U/L 24  26  17    ALT 0 - 35 U/L 17  16  9    Alk Phosphatase 39 - 117 U/L 54  57  45   Total Bilirubin 0.2 - 1.2 mg/dL 0.6  0.5  0.5       Current Medications:    Current Outpatient Medications (Cardiovascular):    amLODipine  (NORVASC ) 5 MG tablet, Take 5 mg by mouth every evening.   atorvastatin  (LIPITOR) 20 MG tablet, Take 20 mg by mouth every evening.   Current Outpatient Medications (Respiratory):    albuterol  (VENTOLIN  HFA) 108 (90 Base) MCG/ACT inhaler, Inhale 2 puffs into the lungs every 6 (six) hours as needed for wheezing or shortness of breath.   Fluticasone-Umeclidin-Vilant (TRELEGY  ELLIPTA) 100-62.5-25 MCG/ACT AEPB, Inhale 1 puff into the lungs daily.   ipratropium (ATROVENT) 0.06 % nasal spray, Place 2 sprays into both nostrils daily as needed for rhinitis.   levocetirizine (XYZAL) 5 MG tablet,    montelukast  (SINGULAIR ) 10 MG tablet, Take 1 tablet (10 mg total) by mouth at bedtime.   Current Outpatient Medications (Hematological):    apixaban  (ELIQUIS ) 2.5 MG TABS tablet, Take 1 tablet (2.5 mg total) by mouth 2 (two) times daily.  Current Outpatient Medications (Other):    anastrozole  (ARIMIDEX ) 1 MG tablet, Take 1 tablet (1 mg total) by mouth daily.   B Complex-Folic Acid (B COMPLEX VITAMINS, W/ FA,) CAPS, Take 1 capsule by mouth daily.   cholecalciferol (VITAMIN D3) 25 MCG (1000 UNIT) tablet, Take 1,000 Units by mouth daily.   FLUZONE HIGH-DOSE 0.5 ML injection,    GEMTESA 75 MG TABS, Take 1 tablet by mouth daily.   hyoscyamine  (LEVSIN  SL) 0.125 MG SL tablet, Place 1 tablet (0.125 mg total) under the tongue every 6 (six) hours as needed for cramping (nausea, diarrhea).   PREVIDENT  5000 BOOSTER PLUS 1.1 % PSTE, Place 1 application onto teeth daily.   PREVNAR 20 0.5 ML injection,    Trospium Chloride 60 MG CP24, Take 1 capsule by mouth every morning.   venlafaxine  XR (EFFEXOR -XR) 75 MG 24 hr capsule, TAKE 1 CAPSULE BY MOUTH ONCE DAILY WITH BREAKFAST   linaclotide  (LINZESS ) 72 MCG capsule, Take 1 capsule (72 mcg total) by mouth daily before breakfast.  Medical History:  Past Medical History:  Diagnosis Date   Allergy    Asthma    Atypical chest pain 05/29/2011   Normal cardiology eval: ECHO and stress testing 2012.  Cardiac cath: normal 2014 for worsening dyspnea     Bimalleolar fracture of right ankle 03/20/2017   Breast cancer (HCC)    Bulging lumbar disc    L4 OR L5 TAKES STEROID INJECTIONS FOR TOOK INJECTION MARCH 2019   Chronic bronchitis (HCC) FEW YEARS AGO   Chronic hepatitis C (HCC)    TOOK HARVONI TX 2017    Colon polyp    COPD (chronic  obstructive pulmonary disease) (HCC)    Diverticulitis 30YRS AGO   Emphysema of lung (HCC)    TOOK PULMONARY FUCTION TEST AND PASSED FEW WEEKS AGO AT [facility]ILY  MD   Gallstones    GERD (gastroesophageal reflux disease)    History of kidney stones 20 YRS AGO    Hormone replacement therapy (postmenopausal) 08/22/2012   Hyperlipidemia    Hypertension    Malignant neoplasm of upper-outer quadrant of left  breast in female, estrogen receptor positive (HCC) 09/30/2020   Osteoarthritis    Pneumonia AT BIRTH   Spondylosis of cervical region without myelopathy or radiculopathy    Squamous cell carcinoma of skin 06/15/2013   Arms and leg; [facility], Dr. Lynnell     Allergies:  Allergies  Allergen Reactions   Chantix [Varenicline] Other (See Comments)    Makes her feel crazy   Codeine Itching     Surgical History:  She  has a past surgical history that includes Tonsillectomy and adenoidectomy (1950's); ORIF ankle fracture (Right, 03/20/2017); Abdominal hysterectomy (1980); Breast surgery (2011); Breast biopsy (02/07/2010); Cholecystectomy (1999); Ankle arthroscopy (Right, 10/02/2017); Hardware Removal (Right, 10/02/2017); Tonsillectomy; Pars plana vitrectomy (Left, 01/04/2020); Membrane peel (Left, 01/04/2020); Injection of silicone oil (Left, 01/04/2020); Photocoagulation with laser (Left, 01/04/2020); Breast lumpectomy with radioactive seed localization (Left, 10/21/2020); Cataract extraction; Eye surgery; Fracture surgery; and Tubal ligation. Family History:  Her family history includes COPD in her paternal uncle; Heart disease in her mother; Stroke in her brother and maternal uncle.  REVIEW OF SYSTEMS  : All other systems reviewed and negative except where noted in the History of Present Illness.  PHYSICAL EXAM: BP 124/72   Pulse 93   Ht 5' (1.524 m)   Wt 146 lb (66.2 kg)   SpO2 95%   BMI 28.51 kg/m  Physical Exam   GENERAL APPEARANCE: Well nourished, in no apparent  distress. HEENT: No cervical lymphadenopathy, unremarkable thyroid , sclerae anicteric, conjunctiva pink. RESPIRATORY: Respiratory effort normal, breath sounds clear to auscultation bilaterally, without rales, rhonchi, or wheezing. CARDIO: RRR with no MRGs, peripheral pulses intact. ABDOMEN: Soft, non-distended, active bowel sounds in all 4 quadrants, no tenderness to palpation, no rebound, no mass appreciated. RECTAL: Declines. MUSCULOSKELETAL: Full ROM, normal gait, without edema. SKIN: Dry, intact without rashes or lesions. No jaundice. NEURO: Alert, oriented, no focal deficits. PSYCH: Cooperative, normal mood and affect.        Alan JONELLE Coombs, PA-C 11:10 AM

## 2024-03-17 ENCOUNTER — Encounter: Payer: Self-pay | Admitting: Physician Assistant

## 2024-03-17 ENCOUNTER — Ambulatory Visit: Admitting: Physician Assistant

## 2024-03-17 VITALS — BP 124/72 | HR 93 | Ht 60.0 in | Wt 146.0 lb

## 2024-03-17 DIAGNOSIS — B182 Chronic viral hepatitis C: Secondary | ICD-10-CM

## 2024-03-17 DIAGNOSIS — K7460 Unspecified cirrhosis of liver: Secondary | ICD-10-CM | POA: Diagnosis not present

## 2024-03-17 DIAGNOSIS — Z853 Personal history of malignant neoplasm of breast: Secondary | ICD-10-CM

## 2024-03-17 DIAGNOSIS — K7469 Other cirrhosis of liver: Secondary | ICD-10-CM

## 2024-03-17 DIAGNOSIS — K5904 Chronic idiopathic constipation: Secondary | ICD-10-CM

## 2024-03-17 DIAGNOSIS — R1110 Vomiting, unspecified: Secondary | ICD-10-CM

## 2024-03-17 DIAGNOSIS — N739 Female pelvic inflammatory disease, unspecified: Secondary | ICD-10-CM | POA: Diagnosis not present

## 2024-03-17 DIAGNOSIS — R1111 Vomiting without nausea: Secondary | ICD-10-CM

## 2024-03-17 DIAGNOSIS — K5909 Other constipation: Secondary | ICD-10-CM

## 2024-03-17 DIAGNOSIS — K219 Gastro-esophageal reflux disease without esophagitis: Secondary | ICD-10-CM

## 2024-03-17 DIAGNOSIS — I2699 Other pulmonary embolism without acute cor pulmonale: Secondary | ICD-10-CM

## 2024-03-17 DIAGNOSIS — F17210 Nicotine dependence, cigarettes, uncomplicated: Secondary | ICD-10-CM

## 2024-03-17 DIAGNOSIS — J449 Chronic obstructive pulmonary disease, unspecified: Secondary | ICD-10-CM

## 2024-03-17 DIAGNOSIS — Z86711 Personal history of pulmonary embolism: Secondary | ICD-10-CM

## 2024-03-17 DIAGNOSIS — Z860101 Personal history of adenomatous and serrated colon polyps: Secondary | ICD-10-CM

## 2024-03-17 MED ORDER — HYOSCYAMINE SULFATE 0.125 MG SL SUBL: 0.1250 mg | SUBLINGUAL_TABLET | Freq: Four times a day (QID) | SUBLINGUAL | 1 refills | Status: AC | PRN

## 2024-03-17 MED ORDER — LINACLOTIDE 72 MCG PO CAPS: 72.0000 ug | ORAL_CAPSULE | Freq: Every day | ORAL | 3 refills | Status: AC

## 2024-03-17 NOTE — Progress Notes (Signed)
 Agree with assessment and plan as outlined.

## 2024-03-17 NOTE — Patient Instructions (Addendum)
 VISIT SUMMARY:  Today, we discussed your ongoing issues with constipation and gastrointestinal symptoms. You have shown improvement since your last visit, with more regular bowel movements and fewer symptoms. We also reviewed your current medications and dietary habits.  YOUR PLAN:  -CHRONIC CONSTIPATION: Chronic constipation means having infrequent or difficult bowel movements over a long period. Your condition has improved with the use of Linzess  and dietary changes. Continue taking Linzess  72 mcg daily in the morning and Benefiber daily at one and a quarter tablespoons. Keep eating kiwi and chia seeds for natural fiber. We will switch your hyoscyamine  to a sublingual form for easier use. If your symptoms worsen or if you develop urinary symptoms, we may consider referring you to pelvic floor physical therapy or getting a CT scan.  -DIVERTICULOSIS OF COLON: Diverticulosis is a condition where small pouches form in the colon. To prevent these pouches from becoming inflamed (diverticulitis), continue with a high fiber diet. Kiwi and chia seeds are safe and beneficial for you to consume.  INSTRUCTIONS:  Please continue with your current medications and dietary habits as discussed. If your symptoms worsen or change significantly, contact our office. We may consider additional tests or referrals if needed.  Thank you for trusting me with your gastrointestinal care!   Kelsey Coombs, PA-C  _______________________________________________________  If your blood pressure at your visit was 140/90 or greater, please contact your primary care physician to follow up on this.  _______________________________________________________  If you are age 68 or older, your body mass index should be between 23-30. Your Body mass index is 28.51 kg/m. If this is out of the aforementioned range listed, please consider follow up with your Primary Care Provider.  If you are age 62 or younger, your body mass index  should be between 19-25. Your Body mass index is 28.51 kg/m. If this is out of the aformentioned range listed, please consider follow up with your Primary Care Provider.   ________________________________________________________  The [facility] providers would like to encourage you to use MYCHART to communicate with providers for non-urgent requests or questions.  Due to long hold times on the telephone, sending your provider a message by [facility] may be a faster and more efficient way to get a response.  Please allow 48 business hours for a response.  Please remember that this is for non-urgent requests.  _______________________________________________________  [facility] is using a team-based approach to care.  Your team is made up of your doctor and two to three APPS. Our APPS (Nurse Practitioners and Physician Assistants) work with your physician to ensure care continuity for you. They are fully qualified to address your health concerns and develop a treatment plan. They communicate directly with your gastroenterologist to care for you. Seeing the Advanced Practice Practitioners on your physician's team can help you by facilitating care more promptly, often allowing for earlier appointments, access to diagnostic testing, procedures, and other specialty referrals.       Here some information about pelvic floor dysfunction. This may be contributing to some of your symptoms. We will continue with our evaluation but I do want you to consider adding on fiber supplement with low-dose MiraLAX  daily. We could also refer to pelvic floor physical therapy.   Pelvic Floor Dysfunction, Female Pelvic floor dysfunction (PFD) is a condition that results when the group of muscles and connective tissues that support the organs in the pelvis (pelvic floor muscles) do not work well. These muscles and their connections form a sling that  supports the colon and bladder. In women, they also support the  uterus. PFD causes pelvic floor muscles to be too weak, too tight, or both. In PFD, muscle movements are not coordinated. This may cause bowel or bladder problems. It may also cause pain. What are the causes? This condition may be caused by an injury to the pelvic area or by a weakening of pelvic muscles. This often results from pregnancy and childbirth or other types of strain. In many cases, the exact cause is not known. What increases the risk? The following factors may make you more likely to develop this condition: Having chronic bladder tissue inflammation (interstitial cystitis). Being an older person. Being overweight. History of radiation treatment for cancer in the pelvic region. Previous pelvic surgery, such as removal of the uterus (hysterectomy). What are the signs or symptoms? Symptoms of this condition vary and may include: Bladder symptoms, such as: Trouble starting urination and emptying the bladder. Frequent urinary tract infections. Leaking urine when coughing, laughing, or exercising (stress incontinence). Having to pass urine urgently or frequently. Pain when passing urine. Bowel symptoms, such as: Constipation. Urgent or frequent bowel movements. Incomplete bowel movements. Painful bowel movements. Leaking stool or gas. Unexplained genital or rectal pain. Genital or rectal muscle spasms. Low back pain. Other symptoms may include: A heavy, full, or aching feeling in the vagina. A bulge that protrudes into the vagina. Pain during or after sex. How is this diagnosed? This condition may be diagnosed based on: Your symptoms and medical history. A physical exam. During the exam, your health care provider may check your pelvic muscles for tightness, spasm, pain, or weakness. This may include a rectal exam and a pelvic exam. In some cases, you may have diagnostic tests, such as: Electrical muscle function tests. Urine flow testing. X-ray tests of bowel  function. Ultrasound of the pelvic organs. How is this treated? Treatment for this condition depends on the symptoms. Treatment options include: Physical therapy. This may include Kegel exercises to help relax or strengthen the pelvic floor muscles. Biofeedback. This type of therapy provides feedback on how tight your pelvic floor muscles are so that you can learn to control them. Internal or external massage therapy. A treatment that involves electrical stimulation of the pelvic floor muscles to help control pain (transcutaneous electrical nerve stimulation, or TENS). Sound wave therapy (ultrasound) to reduce muscle spasms. Medicines, such as: Muscle relaxants. Bladder control medicines. Surgery to reconstruct or support pelvic floor muscles may be an option if other treatments do not help. Follow these instructions at home: Activity Do your usual activities as told by your health care provider. Ask your health care provider if you should modify any activities. Do pelvic floor strengthening or relaxing exercises at home as told by your physical therapist. Lifestyle Maintain a healthy weight. Eat foods that are high in fiber, such as beans, whole grains, and fresh fruits and vegetables. Limit foods that are high in fat and processed sugars, such as fried or sweet foods. Manage stress with relaxation techniques such as yoga or meditation. General instructions If you have problems with leakage: Use absorbable pads or wear padded underwear. Wash frequently with mild soap. Keep your genital and anal area as clean and dry as possible. Ask your health care provider if you should try a barrier cream to prevent skin irritation. Take warm baths to relieve pelvic muscle tension or spasms. Take over-the-counter and prescription medicines only as told by your health care provider. Keep all follow-up visits.  How is this prevented? The cause of PFD is not always known, but there are a few things  you can do to reduce the risk of developing this condition, including: Staying at a healthy weight. Getting regular exercise. Managing stress. Contact a health care provider if: Your symptoms are not improving with home care. You have signs or symptoms of PFD that get worse at home. You develop new signs or symptoms. You have signs of a urinary tract infection, such as: Fever. Chills. Increased urinary frequency. A burning feeling when urinating. You have not had a bowel movement in 3 days (constipation). Summary Pelvic floor dysfunction results when the muscles and connective tissues in your pelvic floor do not work well. These muscles and their connections form a sling that supports your colon and bladder. In women, they also support the uterus. PFD may be caused by an injury to the pelvic area or by a weakening of pelvic muscles. PFD causes pelvic floor muscles to be too weak, too tight, or a combination of both. Symptoms may vary from person to person. In most cases, PFD can be treated with physical therapies and medicines. Surgery may be an option if other treatments do not help. This information is not intended to replace advice given to you by your health care provider. Make sure you discuss any questions you have with your health care provider. Document Revised: 10/26/2020 Document Reviewed: 10/26/2020 Elsevier Patient Education  2022 [employer].

## 2024-04-01 ENCOUNTER — Telehealth: Payer: Self-pay | Admitting: Internal Medicine

## 2024-04-01 NOTE — Telephone Encounter (Signed)
 Not sure the reason for the calls.   Angie

## 2024-04-01 NOTE — Telephone Encounter (Signed)
 Copied from CRM #[id number]. Topic: General - Call Back - No Documentation >> Apr 01, 2024  1:41 PM Robinson H wrote: Reason for CRM: Patient states she missed a couple calls from no messages or notes at time patient called.  Dasani [phone]

## 2024-04-10 ENCOUNTER — Telehealth: Payer: Self-pay | Admitting: Internal Medicine

## 2024-04-10 NOTE — Telephone Encounter (Signed)
 error

## 2024-04-14 ENCOUNTER — Ambulatory Visit: Admitting: Internal Medicine

## 2024-04-16 ENCOUNTER — Ambulatory Visit: Admitting: Internal Medicine

## 2024-04-20 ENCOUNTER — Encounter: Payer: Self-pay | Admitting: Internal Medicine

## 2024-04-20 ENCOUNTER — Ambulatory Visit (INDEPENDENT_AMBULATORY_CARE_PROVIDER_SITE_OTHER): Admitting: Internal Medicine

## 2024-04-20 VITALS — BP 124/72 | HR 94 | Temp 98.0°F | Ht 60.0 in | Wt 149.2 lb

## 2024-04-20 DIAGNOSIS — R7303 Prediabetes: Secondary | ICD-10-CM | POA: Diagnosis not present

## 2024-04-20 DIAGNOSIS — Z0000 Encounter for general adult medical examination without abnormal findings: Secondary | ICD-10-CM | POA: Diagnosis not present

## 2024-04-20 DIAGNOSIS — G4720 Circadian rhythm sleep disorder, unspecified type: Secondary | ICD-10-CM

## 2024-04-20 DIAGNOSIS — R42 Dizziness and giddiness: Secondary | ICD-10-CM | POA: Diagnosis not present

## 2024-04-20 DIAGNOSIS — F518 Other sleep disorders not due to a substance or known physiological condition: Secondary | ICD-10-CM

## 2024-04-20 DIAGNOSIS — R918 Other nonspecific abnormal finding of lung field: Secondary | ICD-10-CM | POA: Diagnosis not present

## 2024-04-20 DIAGNOSIS — R2689 Other abnormalities of gait and mobility: Secondary | ICD-10-CM

## 2024-04-20 DIAGNOSIS — N3281 Overactive bladder: Secondary | ICD-10-CM

## 2024-04-20 DIAGNOSIS — H6121 Impacted cerumen, right ear: Secondary | ICD-10-CM | POA: Diagnosis not present

## 2024-04-20 LAB — COMPREHENSIVE METABOLIC PANEL WITH GFR
ALT: 13 U/L (ref 0–35)
AST: 20 U/L (ref 0–37)
Albumin: 4.2 g/dL (ref 3.5–5.2)
Alkaline Phosphatase: 48 U/L (ref 39–117)
BUN: 21 mg/dL (ref 6–23)
CO2: 28 meq/L (ref 19–32)
Calcium: 9.3 mg/dL (ref 8.4–10.5)
Chloride: 105 meq/L (ref 96–112)
Creatinine, Ser: 0.94 mg/dL (ref 0.40–1.20)
GFR: 57.8 mL/min — ABNORMAL LOW (ref >60.00)
Glucose, Bld: 100 mg/dL — ABNORMAL HIGH (ref 70–99)
Potassium: 4.6 meq/L (ref 3.5–5.1)
Sodium: 140 meq/L (ref 135–145)
Total Bilirubin: 0.5 mg/dL (ref 0.2–1.2)
Total Protein: 6.9 g/dL (ref 6.0–8.3)

## 2024-04-20 LAB — TSH: TSH: 3.76 u[IU]/mL (ref 0.35–5.50)

## 2024-04-20 LAB — CBC WITH DIFFERENTIAL/PLATELET
Basophils Absolute: 0 K/uL (ref 0.0–0.1)
Basophils Relative: 0.4 % (ref 0.0–3.0)
Eosinophils Absolute: 0.2 K/uL (ref 0.0–0.7)
Eosinophils Relative: 2.6 % (ref 0.0–5.0)
HCT: 38.9 % (ref 36.0–46.0)
Hemoglobin: 13 g/dL (ref 12.0–15.0)
Lymphocytes Relative: 25.8 % (ref 12.0–46.0)
Lymphs Abs: 1.8 K/uL (ref 0.7–4.0)
MCHC: 33.3 g/dL (ref 30.0–36.0)
MCV: 91.9 fl (ref 78.0–100.0)
Monocytes Absolute: 0.5 K/uL (ref 0.1–1.0)
Monocytes Relative: 7.1 % (ref 3.0–12.0)
Neutro Abs: 4.6 K/uL (ref 1.4–7.7)
Neutrophils Relative %: 64.1 % (ref 43.0–77.0)
Platelets: 289 K/uL (ref 150.0–400.0)
RBC: 4.23 Mil/uL (ref 3.87–5.11)
RDW: 13.2 % (ref 11.5–15.5)
WBC: 7.2 K/uL (ref 4.0–10.5)

## 2024-04-20 LAB — LIPID PANEL
Cholesterol: 135 mg/dL (ref 0–200)
HDL: 56 mg/dL (ref >39.00)
LDL Cholesterol: 67 mg/dL (ref 0–99)
NonHDL: 78.68
Total CHOL/HDL Ratio: 2
Triglycerides: 58 mg/dL (ref 0.0–149.0)
VLDL: 11.6 mg/dL (ref 0.0–40.0)

## 2024-04-20 LAB — HEMOGLOBIN A1C: Hgb A1c MFr Bld: 6.1 % (ref 4.6–6.5)

## 2024-04-20 MED ORDER — GEMTESA 75 MG PO TABS: 1.0000 | ORAL_TABLET | Freq: Every day | ORAL | 3 refills | Status: AC

## 2024-04-20 MED ORDER — TROSPIUM CHLORIDE ER 60 MG PO CP24: 1.0000 | ORAL_CAPSULE | Freq: Every morning | ORAL | 3 refills | Status: AC

## 2024-04-20 NOTE — Progress Notes (Signed)
 Subjective:   [name] Feb 03, 1946  04/20/2024   CC: Chief Complaint  Patient presents with   Establish Care    Fasting, dizziness balance problems, medication    HPI: [name] is a 78 y.o. female who presents for a routine health maintenance exam.  Labs  collected at time of visit.    Patient reports laying down in bed and having episodes of feeling like the room is spinning. No nausea. Reports episodes occur 2-3 times a week. Episodes started 3-4 months ago. No changes in medications.  Associated nasal congestion due to seasonal allergies. Has been using Singulair  with no relief of symptoms.   She also reports noticing having trouble with her balance at times. She states she will turn around quickly and have to catch herself as she will lose her balance. No falls.   She also reports not being able to get up in the mornings before 11 AM ever since the time-change. She goes to bed around 11:30pm/midnight.   She has history of lung nodules. She is a current tobacco smoker. Last CT scan was in 01/2023 which showed 4mm pulmonary nodule in left lower lobe  History of preDM - need to check A1c today.   Needs refills of gemtesa and trospium for OAB.  HEALTH SCREENINGS: - Pap smear: not applicable - Mammogram (40+): schedule for 04/22/2024  - Colonoscopy (45+): Up to date  - Bone Density (65+): Up to date  - Lung CA screening with low-dose CT:  Ordered today Adults age 51-80 who are current cigarette smokers or quit within the last 15 years. Must have 20 pack year history.   IMMUNIZATIONS: - Tdap: Tetanus vaccination status reviewed: last tetanus booster within 10 years. - HPV: Not applicable - Influenza: received at pharmacy , patient will upload results to mychart - Prevnar 20: Up to date - Zostavax (50+): Up to date   Past medical history, surgical history, medications, allergies, family history and social history reviewed with patient today and changes  made to appropriate areas of the chart.   Social History   Socioeconomic History   Marital status: Legally Separated    Spouse name: Not on file   Number of children: 0   Years of education: Not on file   Highest education level: Bachelor's degree (e.g., BA, AB, BS)  Occupational History   Occupation: retired    Comment: Airline pilot   Occupation: retired  Tobacco Use   Smoking status: Every Day    Current packs/day: 1.00    Average packs/day: 1 pack/day for 63.8 years (63.8 ttl pk-yrs)    Types: Cigarettes    Start date: 07/20/1960   Smokeless tobacco: Never   Tobacco comments:    Smoking 3 quarters of a ppd.  She is not trying to quit.  02/11/24.  Hfb RN  Vaping Use   Vaping status: Never Used  Substance and Sexual Activity   Alcohol use: Not Currently    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   Drug use: No   Sexual activity: Yes    Birth control/protection: None  Other Topics Concern   Not on file  Social History Narrative   Right handed   Social Drivers of Health   Financial Resource Strain: Low Risk  (04/10/2024)   Overall Financial Resource Strain (CARDIA)    Difficulty of Paying Living Expenses: Not very hard  Food Insecurity: Food Insecurity Present (04/10/2024)   Hunger Vital Sign    Worried About Running  Out of Food in the Last Year: Sometimes true    Ran Out of Food in the Last Year: Sometimes true  Transportation Needs: No Transportation Needs (04/10/2024)   PRAPARE - Administrator, Civil Service (Medical): No    Lack of Transportation (Non-Medical): No  Physical Activity: Sufficiently Active (04/10/2024)   Exercise Vital Sign    Days of Exercise per Week: 4 days    Minutes of Exercise per Session: 50 min  Stress: No Stress Concern Present (04/10/2024)   [employer] of Occupational Health - Occupational Stress Questionnaire    Feeling of Stress: Only a little  Social Connections: Moderately Integrated (04/10/2024)   Social Connection  and Isolation Panel    Frequency of Communication with Friends and Family: More than three times a week    Frequency of Social Gatherings with Friends and Family: Once a week    Attends Religious Services: More than 4 times per year    Active Member of [employer] or Organizations: Yes    Attends Engineer, structural: More than 4 times per year    Marital Status: Divorced  Intimate Partner Violence: Not At Risk (05/25/2023)   Received from [facility]   HITS    Over the last 12 months how often did your partner physically hurt you?: Never    Over the last 12 months how often did your partner insult you or talk down to you?: Never    Over the last 12 months how often did your partner threaten you with physical harm?: Never    Over the last 12 months how often did your partner scream or curse at you?: Never     Past Medical History:  Diagnosis Date   Allergy    Asthma    Atypical chest pain 05/29/2011   Normal cardiology eval: ECHO and stress testing 2012.  Cardiac cath: normal 2014 for worsening dyspnea     Bimalleolar fracture of right ankle 03/20/2017   Breast cancer (HCC)    Bulging lumbar disc    L4 OR L5 TAKES STEROID INJECTIONS FOR TOOK INJECTION MARCH 2019   Chronic bronchitis (HCC) FEW YEARS AGO   Chronic hepatitis C (HCC)    TOOK HARVONI TX 2017    Colon polyp    COPD (chronic obstructive pulmonary disease) (HCC)    Diverticulitis 48YRS AGO   Emphysema of lung (HCC)    TOOK PULMONARY FUCTION TEST AND PASSED FEW WEEKS AGO AT FAMILY  MD   Gallstones    GERD (gastroesophageal reflux disease)    History of kidney stones 20 YRS AGO    Hormone replacement therapy (postmenopausal) 08/22/2012   Hyperlipidemia    Hypertension    Malignant neoplasm of upper-outer quadrant of left breast in female, 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 Lynnell      Past Surgical History:  Procedure Laterality Date   ABDOMINAL HYSTERECTOMY  1980   PARTIAL   ANKLE ARTHROSCOPY Right 10/02/2017   Procedure: RIGHT ANKLE ARTHROSCOPY WITH EXTENSIVE DEBRIDEMENT;  Surgeon: Yvone Rush, MD;  Location: [facility];  Service: Orthopedics;  Laterality: Right;  90 MINUTES FOR CASE   BREAST BIOPSY  02/07/2010   BREAST LUMPECTOMY WITH RADIOACTIVE SEED LOCALIZATION Left 10/21/2020   Procedure: LEFT BREAST LUMPECTOMY WITH RADIOACTIVE  SEED LOCALIZATION;  Surgeon: Vernetta Berg, MD;  Location: [facility] OR;  Service: General;  Laterality: Left;  60 MINUTES ROOM 2   BREAST SURGERY  2011   biopsy   CATARACT EXTRACTION     CHOLECYSTECTOMY  1999   LAPAROSCOPIC   EYE SURGERY     FRACTURE SURGERY     HARDWARE REMOVAL Right 10/02/2017   Procedure: HARDWARE REMOVAL;  Surgeon: Yvone Rush, MD;  Location: [facility] [place];  Service: Orthopedics;  Laterality: Right;   INJECTION OF SILICONE OIL Left 01/04/2020   Procedure: INJECTION OF SILICONE OIL;  Surgeon: Jarold Mayo, MD;  Location: [facility] OR;  Service: Ophthalmology;  Laterality: Left;   MEMBRANE PEEL Left 01/04/2020   Procedure: MEMBRANE PEEL;  Surgeon: Jarold Mayo, MD;  Location: [facility] OR;  Service: Ophthalmology;  Laterality: Left;   ORIF ANKLE FRACTURE Right 03/20/2017   Procedure: OPEN REDUCTION INTERNAL FIXATION (ORIF) ANKLE FRACTURE;  Surgeon: Yvone Rush, MD;  Location: [facility];  Service: Orthopedics;  Laterality: Right;   PARS PLANA VITRECTOMY Left 01/04/2020   Procedure: PARS PLANA VITRECTOMY WITH 25 GAUGE;  Surgeon: Jarold Mayo, MD;  Location: [facility] OR;  Service: Ophthalmology;  Laterality: Left;   PHOTOCOAGULATION WITH LASER Left 01/04/2020   Procedure: PHOTOCOAGULATION WITH LASER;  Surgeon: Jarold Mayo, MD;  Location: [facility] OR;  Service: Ophthalmology;  Laterality: Left;    TONSILLECTOMY     TONSILLECTOMY AND ADENOIDECTOMY  1950's   TUBAL LIGATION      Current Outpatient Medications on File Prior to Visit  Medication Sig   albuterol  (VENTOLIN  HFA) 108 (90 Base) MCG/ACT inhaler Inhale 2 puffs into the lungs every 6 (six) hours as needed for wheezing or shortness of breath.   amLODipine  (NORVASC ) 5 MG tablet Take 5 mg by mouth every evening.   anastrozole  (ARIMIDEX ) 1 MG tablet Take 1 tablet (1 mg total) by mouth daily.   apixaban  (ELIQUIS ) 2.5 MG TABS tablet Take 1 tablet (2.5 mg total) by mouth 2 (two) times daily.   atorvastatin  (LIPITOR) 20 MG tablet Take 20 mg by mouth every evening.    B Complex-Folic Acid (B COMPLEX VITAMINS, W/ FA,) CAPS Take 1 capsule by mouth daily.   cholecalciferol (VITAMIN D3) 25 MCG (1000 UNIT) tablet Take 1,000 Units by mouth daily.   Fluticasone-Umeclidin-Vilant (TRELEGY ELLIPTA ) 100-62.5-25 MCG/ACT AEPB Inhale 1 puff into the lungs daily.   hyoscyamine  (LEVSIN  SL) 0.125 MG SL tablet Place 1 tablet (0.125 mg total) under the tongue every 6 (six) hours as needed for cramping (nausea, diarrhea).   ipratropium (ATROVENT) 0.06 % nasal spray Place 2 sprays into both nostrils daily as needed for rhinitis.   linaclotide  (LINZESS ) 72 MCG capsule Take 1 capsule (72 mcg total) by mouth daily before breakfast.   montelukast  (SINGULAIR ) 10 MG tablet Take 1 tablet (10 mg total) by mouth at bedtime.   PREVIDENT  5000 BOOSTER PLUS 1.1 % PSTE Place 1 application onto teeth daily.   venlafaxine  XR (EFFEXOR -XR) 75 MG 24 hr capsule TAKE 1 CAPSULE BY MOUTH ONCE DAILY WITH BREAKFAST   No current facility-administered medications on file prior to visit.    Allergies  Allergen Reactions   Chantix [Varenicline] Other (See Comments)    Makes her feel crazy   Codeine Itching    Family History  Problem Relation Age of Onset   Heart disease Mother    Stroke Brother    Stroke Maternal Uncle    COPD Paternal Uncle    Colon cancer Neg Hx  Stomach  cancer Neg Hx    Esophageal cancer Neg Hx      ROS: Denies fever, unexplained weight loss/gain, hearing or vision changes, cardiac or respiratory complaints. Denies neurological deficits, musculoskeletal complaints, gastrointestinal or genitourinary complaints, mental health complaints, and skin changes.   Objective:   Today's Vitals   04/20/24 0958  BP: 124/72  Pulse: 94  Temp: 98 F (36.7 C)  TempSrc: Temporal  SpO2: 96%  Weight: 149 lb 3.2 oz (67.7 kg)  Height: 5' (1.524 m)    GENERAL APPEARANCE: Well-appearing, in NAD. Well nourished.  SKIN: Pink, warm and dry. Turgor normal. No rash, lesion, ulceration, or ecchymoses. Hair evenly distributed.  HEENT: HEAD: Normocephalic.  EYES: PERRLA. EOMI. Lids intact w/o defect. Sclera white, Conjunctiva pink w/o exudate.  EARS: External ear w/o redness, swelling, masses or lesions. EAC clear. Ceruminosis is noted to right ear.  Verbal consent obtained. Wax is removed by syringing and manual debridement. Instructions for home care to prevent wax buildup are given. Appropriate acuity to conversational tones.  NOSE: Septum midline w/o deformity. Nares patent, mucosa pink and inflamed with clear drainage.  THROAT: Uvula midline. Oropharynx clear. Tonsils absent. Oral mucosa pink and moist.  NECK: Supple, Trachea midline. Full ROM w/o pain or tenderness. No lymphadenopathy. Thyroid  non-tender w/o enlargement or palpable masses.  RESPIRATORY: Chest wall symmetrical w/o masses. Respirations even and non-labored. Breath sounds clear to auscultation bilaterally. No wheezes, rales, rhonchi, or crackles. CARDIAC: S1, S2 present, regular rate and rhythm. No gallops, murmurs, rubs, or clicks. No carotid bruits. Capillary refill <2 seconds. Peripheral pulses 2+ bilaterally. GI: Abdomen soft w/o distention. Normoactive bowel sounds. No palpable masses or tenderness. No guarding or rebound tenderness. Liver and spleen w/o tenderness or enlargement. No CVA  tenderness.  MSK: Muscle tone and strength appropriate for age, w/o atrophy or abnormal movement.  EXTREMITIES: Active ROM intact, w/o tenderness, crepitus, or contracture. No obvious joint deformities or effusions. No clubbing, edema, or cyanosis.  NEUROLOGIC: CN's II-XII intact. Motor strength symmetrical with no obvious weakness. No sensory deficits. Steady, even gait.  PSYCH/MENTAL STATUS: Alert, oriented x 3. Cooperative, appropriate mood and affect.   Depression and Anxiety Screen done today and results listed below:     04/20/2024    9:51 AM 12/12/2023   10:15 AM 10/09/2023    9:55 AM 08/13/2023    9:52 AM 07/11/2023   10:16 AM  Depression screen PHQ 2/9  Decreased Interest 0 0 0 0 0  Down, Depressed, Hopeless 0 0 0 0 0  PHQ - 2 Score 0 0 0 0 0  Altered sleeping 1    0  Tired, decreased energy 0    1  Change in appetite 0    0  Feeling bad or failure about yourself  0    0  Trouble concentrating 0    0  Moving slowly or fidgety/restless 0    0  Suicidal thoughts 0    0  PHQ-9 Score 1    1  Difficult doing work/chores Not difficult at all    Not difficult at all      04/20/2024    9:50 AM 07/11/2023   10:16 AM  GAD 7 : Generalized Anxiety Score  Nervous, Anxious, on Edge 0 0  Control/stop worrying 0 0  Worry too much - different things 0 0  Trouble relaxing 0 0  Restless 2 0  Easily annoyed or irritable 1 2  Afraid - awful might happen 0 0  Total GAD 7 Score 3 2  Anxiety Difficulty Not difficult at all Not difficult at all     Results for orders placed or performed in visit on 01/28/24  Basic Metabolic Panel (BMET)   Collection Time: 01/28/24  3:02 PM  Result Value Ref Range   Sodium 143 135 - 145 mEq/L   Potassium 4.2 3.5 - 5.1 mEq/L   Chloride 107 96 - 112 mEq/L   CO2 29 19 - 32 mEq/L   Glucose, Bld 118 (H) 70 - 99 mg/dL   BUN 17 6 - 23 mg/dL   Creatinine, Ser 8.86 0.40 - 1.20 mg/dL   GFR 53.57 (L) >39.99 mL/min   Calcium  9.4 8.4 - 10.5 mg/dL    Assessment  & Plan:  Annual physical exam -     CBC with Differential/Platelet -     Comprehensive metabolic panel with GFR -     Lipid panel -     TSH  Prediabetes -     Hemoglobin A1c  Overactive bladder -     Trospium Chloride ER; Take 1 capsule (60 mg total) by mouth every morning.  Dispense: 90 capsule; Refill: 3 -     Gemtesa; Take 1 tablet (75 mg total) by mouth daily.  Dispense: 90 tablet; Refill: 3  Pulmonary nodules -     CT CHEST WO CONTRAST; Future  Episode of dizziness - possibly due to cerumen impaction and URI symptoms due to allergies. Try Claritin 10mg  PO daily OTC.   Impacted cerumen of right ear - removed cerumen. Patient tolerated well.   Impairment of balance - discussed aging process and impairment of balance.  - Advised changing positions slowly to decrease balance instability.   Disrupted sleep-wake cycle - advised patient to adjust bedtime , pushing it back by 1/2 hour to 1 hour each week so she can get up earlier.   Orders Placed This Encounter  Procedures   CT CHEST WO CONTRAST    Standing Status:   Future    Expiration Date:   04/20/2025    Preferred imaging location?:   [facility]   CBC with Differential/Platelet   Comprehensive metabolic panel with GFR   Lipid panel   TSH   Hemoglobin A1C    PATIENT COUNSELING:  - Encourage a healthy well-balanced diet. Patient may adjust caloric intake to maintain or achieve ideal body weight. May reduce intake of dietary saturated fat and total fat and have adequate dietary potassium and calcium  preferably from fresh fruits, vegetables, and low-fat dairy products.   - Advise to avoid cigarette smoking.  - Stress the importance of regular exercise  NEXT PREVENTATIVE PHYSICAL DUE IN 1 YEAR.  Return in about 6 months (around 10/19/2024) for Chronic Condition follow up.  Rosina Senters, FNP

## 2024-04-21 ENCOUNTER — Ambulatory Visit: Payer: Self-pay | Admitting: Internal Medicine

## 2024-04-22 ENCOUNTER — Ambulatory Visit
Admission: RE | Admit: 2024-04-22 | Discharge: 2024-04-22 | Disposition: A | Source: Ambulatory Visit | Attending: Hematology

## 2024-04-22 DIAGNOSIS — Z1231 Encounter for screening mammogram for malignant neoplasm of breast: Secondary | ICD-10-CM

## 2024-04-26 ENCOUNTER — Other Ambulatory Visit: Payer: Self-pay | Admitting: Nurse Practitioner

## 2024-04-26 DIAGNOSIS — Z170 Estrogen receptor positive status [ER+]: Secondary | ICD-10-CM

## 2024-05-15 ENCOUNTER — Telehealth: Payer: Self-pay | Admitting: Internal Medicine

## 2024-05-15 NOTE — Telephone Encounter (Signed)
 Copied from CRM #[id number]. Topic: Appointments - Scheduling Inquiry for Clinic >> May 15, 2024 10:43 AM Macario HERO wrote: Reason for CRM: Patient called to schedule an AWV. Stated she needs an appointment after thanksgiving and before January.

## 2024-06-08 ENCOUNTER — Other Ambulatory Visit: Payer: Self-pay | Admitting: Nurse Practitioner

## 2024-06-08 DIAGNOSIS — Z170 Estrogen receptor positive status [ER+]: Secondary | ICD-10-CM

## 2024-06-08 NOTE — Progress Notes (Unsigned)
 Patient Care Team: Billy Knee, FNP as PCP - General (Internal Medicine) Tyree Nanetta SAILOR, RN as Oncology Nurse Navigator Vernetta Berg, MD as Consulting Physician (General Surgery) Dewey Rush, MD as Consulting Physician (Radiation Oncology) Jarold Mayo, MD as Consulting Physician (Ophthalmology) March Alyce SAUNDERS, MD as Referring Physician (Gastroenterology) Darol Norris, MD as Referring Physician (Obstetrics and Gynecology) Lynnell Nottingham, MD as Consulting Physician (Dermatology) Lanny Callander, MD as Attending Physician (Hematology and Oncology) Tat, Asberry RAMAN, DO as Consulting Physician (Neurology)  Clinic Day:  06/09/2024  Referring physician: Billy Knee, FNP  ASSESSMENT & PLAN:   Assessment & Plan: Malignant neoplasm of upper-outer quadrant of left breast in female, 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 -S/p adjuvant radiation 12/05/20 - 12/30/20 under Dr. Dewey. -she was started on tamoxifen  on 01/30/21, goal 5 years - Changed to anastrozole  in 10/2023 due to development of PE.  Tolerating anastrozole  with minor hot flashes and night sweats.  Does better during cooler months. Continues to take Effexor  to help with management of side effects.  - 04/22/2024 - 3D screening mammogram showing category C breast density with negative results. Will order contrast enhanced mammogram for screening breast exam in 04/2025.  -proceed with Zometa  infusion today. Continue every 6 months for total of 2 years. - Continue anastrozole  daily. -Plan for labs and follow-up in 6 months, sooner if needed.     Bone health DEXA scan done 08/2023.  She has osteopenia/low bone mass. FRAX* 10-year Probability of Fracture Based on femoral neck BMD: DualFemur (Right) was as follows:  Major Osteoporotic Fracture: 18.8% Hip Fracture:                 6.0% Population:                  [place]  (Caucasian) Risk Factors: History of Fracture (Adult) (V15.51), Tobacco User (Current Smoker).  Today, she is receiving second dose IV Zometa  to help with bone strength and prevention of future breast cancer.  She will continue with Zometa  infusions every 6 to complete 2 years  Left breast left breast cancer, ER + Reviewed screening mammogram from 04/22/2024.  She has breast density category density category C and overall results were negative.  Plan to get contrast enhanced mammogram for breast cancer screening in October 2026.  Continue anastrozole  daily until 01/2026, making for a total of 5 years of anti-estrogen therapy.  She continues to take Effexor  to help manage side effect of hot flashes.  Plan  Labs reviewed. -CBC and CMP unremarkable. Reviewed screening mammogram from 04/2024 showing category C breast density and negative results.  -order contrast enhanced mammogram for 04/2025 for breast cancer screening.  Proceed with Zometa  infusion today. Continue with every 6 month infusions for total of 2 years.  Continue anastrozole  daily until 01/2026 making for full 5 years of anti-estrogen therapy.  Plan for labs and follow up in 6 months, sooner if needed.    The patient understands the plans discussed today and is in agreement with them.  She knows to contact our office if she develops concerns prior to her next appointment.  I provided 25 minutes of face-to-face time during this encounter and > 50% was spent counseling as documented under my assessment and plan.    Powell FORBES Lessen, NP  [facility] -  [facility] DE[facility] [address] [place] [address] Dept: [phone] Dept Fax: [phone]   Orders Placed This Encounter  Procedures   MM 3D SCREENING MAMMOGRAM BILATERAL BREAST    Standing Status:   Future    Expected Date:   04/23/2025    Expiration Date:    07/22/2025    Scheduling Instructions:     This is for contrast enhanced mammogram at next screening mammogram    Reason for Exam (SYMPTOM  OR DIAGNOSIS REQUIRED):   history of left breast cancer with increased breast density of category C    [facility]erred [facility]      CHIEF COMPLAINT:  CC: Left breast cancer, ER +  Current Treatment: Anastrozole  starting 10/2023 (initially started tamoxifen  01/2021)  INTERVAL HISTORY:  [name] is here today for repeat clinical assessment. She last saw Lacie, NP, on 12/10/2023.  Patient continues to take Eliquis  along with anastrozole .  Had been where blood clots are still a risk factor of all AE.  She had 3D mammogram on 04/22/2024.  She has breast density category C.  Results were overall negative.  DEXA scan done 08/06/2023.  She is considered low bone mass/osteopenia. She is on Zometa  infusions every 6 months. Today, she is receiving treatment #2. She continues to take eliquis  for PE prevention. She voices understanding that she is able to come off of this from oncology perspective. She is very concerned about new blood clot development and has chosen to stay on Eliquis  through remainder of AI treatment.  She denies new concerns or complaints today.  She denies any problems or concerns with either breast.  Denies new masses, lumps, or axillary swelling.  She denies chest pain, chest pressure, or shortness of breath. She denies headaches or visual disturbances. She denies abdominal pain, nausea, vomiting, or changes in bowel or bladder habits. She denies fevers or chills. She denies pain. Her appetite is good. Her weight has been stable.  I have reviewed the past medical history, past surgical history, social history and family history with the patient and they are unchanged from previous note.  ALLERGIES:  is allergic to chantix [varenicline] and codeine.  MEDICATIONS:  Current Outpatient Medications  Medication Sig Dispense Refill    albuterol  (VENTOLIN  HFA) 108 (90 Base) MCG/ACT inhaler Inhale 2 puffs into the lungs every 6 (six) hours as needed for wheezing or shortness of breath. 18 g 0   amLODipine  (NORVASC ) 5 MG tablet Take 5 mg by mouth every evening.     anastrozole  (ARIMIDEX ) 1 MG tablet Take 1 tablet (1 mg total) by mouth daily. 90 tablet 1   apixaban  (ELIQUIS ) 2.5 MG TABS tablet Take 1 tablet (2.5 mg total) by mouth 2 (two) times daily. 60 tablet 11   atorvastatin  (LIPITOR) 20 MG tablet Take 20 mg by mouth every evening.      B Complex-Folic Acid (B COMPLEX VITAMINS, W/ FA,) CAPS Take 1 capsule by mouth daily.     cholecalciferol (VITAMIN D3) 25 MCG (1000 UNIT) tablet Take 1,000 Units by mouth daily.     Fluticasone-Umeclidin-Vilant (TRELEGY ELLIPTA ) 100-62.5-25 MCG/ACT AEPB Inhale 1 puff into the lungs daily. 1 each 5   GEMTESA  75 MG TABS Take 1 tablet (75 mg total) by mouth daily. 90 tablet 3   hyoscyamine  (LEVSIN  SL) 0.125 MG SL tablet Place 1 tablet (0.125 mg total) under the tongue every 6 (six) hours as needed for cramping (nausea, diarrhea). 20 tablet  1   ipratropium (ATROVENT) 0.06 % nasal spray Place 2 sprays into both nostrils daily as needed for rhinitis.     linaclotide  (LINZESS ) 72 MCG capsule Take 1 capsule (72 mcg total) by mouth daily before breakfast. 90 capsule 3   montelukast  (SINGULAIR ) 10 MG tablet Take 1 tablet (10 mg total) by mouth at bedtime. 30 tablet 11   PREVIDENT  5000 BOOSTER PLUS 1.1 % PSTE Place 1 application onto teeth daily.     Trospium  Chloride 60 MG CP24 Take 1 capsule (60 mg total) by mouth every morning. 90 capsule 3   venlafaxine  XR (EFFEXOR -XR) 75 MG 24 hr capsule TAKE 1 CAPSULE BY MOUTH ONCE DAILY WITH BREAKFAST 90 capsule 0   No current facility-administered medications for this visit.   Facility-Administered Medications Ordered in Other Visits  Medication Dose Route Frequency Provider Last Rate Last Admin   0.9 %  sodium chloride  infusion   Intravenous Continuous Lanny Callander, MD   Stopped at 06/09/24 1141    HISTORY OF PRESENT ILLNESS:   Oncology History  Malignant neoplasm of upper-outer quadrant of left breast in female, estrogen receptor positive (HCC) (Resolved)  09/30/2020 Initial Diagnosis   Malignant neoplasm of upper-outer quadrant of left breast in female, estrogen receptor positive (HCC)   10/05/2020 Cancer Staging   Staging form: Breast, AJCC 8th Edition - Clinical stage from 10/05/2020: Stage IA (cT1c, cN0, cM0, G2, ER+, PR+, HER2-) - Signed by Layla Sandria BROCKS, MD on 10/05/2020 Stage prefix: Initial diagnosis Method of lymph node assessment: Clinical Histologic grading system: 3 grade system   10/21/2020 Cancer Staging   Staging form: Breast, AJCC 8th Edition - Pathologic stage from 10/21/2020: Stage IA (pT1c, pN0, cM0, G1, ER+, PR+, HER2-) - Signed by Crawford Morna Pickle, NP on 11/02/2020 Histologic grading system: 3 grade system       REVIEW OF SYSTEMS:   Constitutional: Denies fevers, chills or abnormal weight loss Eyes: Denies blurriness of vision Ears, nose, mouth, throat, and face: Denies mucositis or sore throat Respiratory: Denies cough, dyspnea or wheezes Cardiovascular: Denies palpitation, chest discomfort or lower extremity swelling Gastrointestinal:  Denies nausea, heartburn or change in bowel habits Skin: Denies abnormal skin rashes Lymphatics: Denies new lymphadenopathy or easy bruising Neurological:Denies numbness, tingling or new weaknesses Behavioral/Psych: Mood is stable, no new changes  All other systems were reviewed with the patient and are negative.   VITALS:   Today's Vitals   06/09/24 1014 06/09/24 1156  BP: 134/72   Pulse: 91   Resp: 17   Temp: 98.1 F (36.7 C)   SpO2: 94%   Weight: 148 lb 8 oz (67.4 kg)   PainSc:  0-No pain   Body mass index is 29 kg/m.  Wt Readings from Last 3 Encounters:  06/09/24 148 lb 8 oz (67.4 kg)  04/20/24 149 lb 3.2 oz (67.7 kg)  03/17/24 146 lb (66.2 kg)    Body  mass index is 29 kg/m.  Performance status (ECOG): 1 - Symptomatic but completely ambulatory  PHYSICAL EXAM:   GENERAL:alert, no distress and comfortable SKIN: skin color, texture, turgor are normal, no rashes or significant lesions EYES: normal, Conjunctiva are pink and non-injected, sclera clear OROPHARYNX:no exudate, no erythema and lips, buccal mucosa, and tongue normal  NECK: supple, thyroid  normal size, non-tender, without nodularity LYMPH:  no palpable lymphadenopathy in the cervical, axillary or inguinal LUNGS: clear to auscultation and percussion with normal breathing effort HEART: regular rate & rhythm and no murmurs and no lower extremity  edema ABDOMEN:abdomen soft, non-tender and normal bowel sounds Musculoskeletal:no cyanosis of digits and no clubbing  NEURO: alert & oriented x 3 with fluent speech, no focal motor/sensory deficits BREAST: There are no palpable masses or lumps noted in the right breast today.  There is no nipple inversion or nipple discharge.  There is no axillary lymphadenopathy on the right.  There is fibrous tissue formation_under surgical scar along the outer aspect of left breast.  No new masses or lumps are noted on today's exam.  There is no nipple inversion or nipple discharge.  There is no axillary lymphadenopathy on the left.  LABORATORY DATA:  I have reviewed the data as listed    Component Value Date/Time   NA 142 06/09/2024 1006   K 4.3 06/09/2024 1006   CL 105 06/09/2024 1006   CO2 24 06/09/2024 1006   GLUCOSE 135 (H) 06/09/2024 1006   BUN 20 06/09/2024 1006   CREATININE 1.00 06/09/2024 1006   CALCIUM  9.3 06/09/2024 1006   PROT 7.3 06/09/2024 1006   ALBUMIN 4.3 06/09/2024 1006   AST 27 06/09/2024 1006   ALT 15 06/09/2024 1006   ALKPHOS 57 06/09/2024 1006   BILITOT 0.5 06/09/2024 1006   GFRNONAA 57 (L) 06/09/2024 1006     Lab Results  Component Value Date   WBC 7.4 06/09/2024   NEUTROABS 4.4 06/09/2024   HGB 13.3 06/09/2024    HCT 39.6 06/09/2024   MCV 91.7 06/09/2024   PLT 252 06/09/2024

## 2024-06-08 NOTE — Assessment & Plan Note (Signed)
 stage IA pT1c, cN0, grade 1 -Diagnosed 08/2020 s/p left lumpectomy 10/21/20 by Dr. 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 -S/p adjuvant radiation 12/05/20 - 12/30/20 under Dr. Dewey. -she was started on tamoxifen  on 01/30/21, goal 5 years - Changed to anastrozole  in 10/2023 due to development of PE.

## 2024-06-09 ENCOUNTER — Ambulatory Visit

## 2024-06-09 ENCOUNTER — Inpatient Hospital Stay: Admitting: Nurse Practitioner

## 2024-06-09 ENCOUNTER — Telehealth: Payer: Self-pay | Admitting: Hematology

## 2024-06-09 ENCOUNTER — Encounter: Payer: Self-pay | Admitting: Nurse Practitioner

## 2024-06-09 ENCOUNTER — Ambulatory Visit: Admitting: Nurse Practitioner

## 2024-06-09 ENCOUNTER — Other Ambulatory Visit

## 2024-06-09 ENCOUNTER — Ambulatory Visit: Admitting: Hematology

## 2024-06-09 ENCOUNTER — Inpatient Hospital Stay: Attending: Nurse Practitioner

## 2024-06-09 VITALS — BP 134/72 | HR 91 | Temp 98.1°F | Resp 17 | Wt 148.5 lb

## 2024-06-09 DIAGNOSIS — Z780 Asymptomatic menopausal state: Secondary | ICD-10-CM

## 2024-06-09 DIAGNOSIS — C50412 Malignant neoplasm of upper-outer quadrant of left female breast: Secondary | ICD-10-CM | POA: Diagnosis present

## 2024-06-09 DIAGNOSIS — M8580 Other specified disorders of bone density and structure, unspecified site: Secondary | ICD-10-CM | POA: Insufficient documentation

## 2024-06-09 DIAGNOSIS — Z170 Estrogen receptor positive status [ER+]: Secondary | ICD-10-CM | POA: Insufficient documentation

## 2024-06-09 DIAGNOSIS — Z7983 Long term (current) use of bisphosphonates: Secondary | ICD-10-CM | POA: Diagnosis not present

## 2024-06-09 LAB — CMP (CANCER CENTER ONLY)
ALT: 15 U/L (ref 0–44)
AST: 27 U/L (ref 15–41)
Albumin: 4.3 g/dL (ref 3.5–5.0)
Alkaline Phosphatase: 57 U/L (ref 38–126)
Anion gap: 12 (ref 5–15)
BUN: 20 mg/dL (ref 8–23)
CO2: 24 mmol/L (ref 22–32)
Calcium: 9.3 mg/dL (ref 8.9–10.3)
Chloride: 105 mmol/L (ref 98–111)
Creatinine: 1 mg/dL (ref 0.44–1.00)
GFR, Estimated: 57 mL/min — ABNORMAL LOW (ref >60)
Glucose, Bld: 135 mg/dL — ABNORMAL HIGH (ref 70–99)
Potassium: 4.3 mmol/L (ref 3.5–5.1)
Sodium: 142 mmol/L (ref 135–145)
Total Bilirubin: 0.5 mg/dL (ref 0.0–1.2)
Total Protein: 7.3 g/dL (ref 6.5–8.1)

## 2024-06-09 LAB — CBC WITH DIFFERENTIAL (CANCER CENTER ONLY)
Abs Immature Granulocytes: 0.01 K/uL (ref 0.00–0.07)
Basophils Absolute: 0 K/uL (ref 0.0–0.1)
Basophils Relative: 1 %
Eosinophils Absolute: 0.3 K/uL (ref 0.0–0.5)
Eosinophils Relative: 3 %
HCT: 39.6 % (ref 36.0–46.0)
Hemoglobin: 13.3 g/dL (ref 12.0–15.0)
Immature Granulocytes: 0 %
Lymphocytes Relative: 29 %
Lymphs Abs: 2.2 K/uL (ref 0.7–4.0)
MCH: 30.8 pg (ref 26.0–34.0)
MCHC: 33.6 g/dL (ref 30.0–36.0)
MCV: 91.7 fL (ref 80.0–100.0)
Monocytes Absolute: 0.6 K/uL (ref 0.1–1.0)
Monocytes Relative: 8 %
Neutro Abs: 4.4 K/uL (ref 1.7–7.7)
Neutrophils Relative %: 59 %
Platelet Count: 252 K/uL (ref 150–400)
RBC: 4.32 MIL/uL (ref 3.87–5.11)
RDW: 12.4 % (ref 11.5–15.5)
WBC Count: 7.4 K/uL (ref 4.0–10.5)
nRBC: 0 % (ref 0.0–0.2)

## 2024-06-09 MED ORDER — ZOLEDRONIC ACID 4 MG/100ML IV SOLN
4.0000 mg | Freq: Once | INTRAVENOUS | Status: AC
  Administered 2024-06-09: 4 mg via INTRAVENOUS
  Filled 2024-06-09: qty 100

## 2024-06-09 MED ORDER — SODIUM CHLORIDE 0.9 % IV SOLN: INTRAVENOUS | Status: DC

## 2024-06-09 NOTE — Patient Instructions (Signed)

## 2024-07-21 ENCOUNTER — Ambulatory Visit

## 2024-08-13 ENCOUNTER — Ambulatory Visit: Admitting: Adult Health

## 2024-08-31 ENCOUNTER — Ambulatory Visit

## 2024-09-07 ENCOUNTER — Ambulatory Visit

## 2024-09-14 ENCOUNTER — Ambulatory Visit

## 2024-12-08 ENCOUNTER — Inpatient Hospital Stay

## 2024-12-08 ENCOUNTER — Inpatient Hospital Stay: Admitting: Hematology

## 2024-12-08 ENCOUNTER — Inpatient Hospital Stay: Attending: Nurse Practitioner
